# Patient Record
Sex: Male | Born: 1945 | Race: Black or African American | Hispanic: No | Marital: Single | Smoking: Former smoker
Health system: Southern US, Community
[De-identification: ages and names within clinical notes are randomized; demographics above are authoritative.]

## PROBLEM LIST (undated history)

## (undated) DIAGNOSIS — R350 Frequency of micturition: Secondary | ICD-10-CM

## (undated) DIAGNOSIS — N433 Hydrocele, unspecified: Secondary | ICD-10-CM

## (undated) DIAGNOSIS — I351 Nonrheumatic aortic (valve) insufficiency: Secondary | ICD-10-CM

## (undated) DIAGNOSIS — C679 Malignant neoplasm of bladder, unspecified: Secondary | ICD-10-CM

## (undated) DIAGNOSIS — Z8546 Personal history of malignant neoplasm of prostate: Secondary | ICD-10-CM

## (undated) DIAGNOSIS — I441 Atrioventricular block, second degree: Secondary | ICD-10-CM

## (undated) DIAGNOSIS — R351 Nocturia: Secondary | ICD-10-CM

## (undated) DIAGNOSIS — C61 Malignant neoplasm of prostate: Secondary | ICD-10-CM

## (undated) DIAGNOSIS — I10 Essential (primary) hypertension: Secondary | ICD-10-CM

## (undated) HISTORY — DX: Malignant neoplasm of prostate: C61

## (undated) HISTORY — DX: Essential (primary) hypertension: I10

## (undated) HISTORY — DX: Atrioventricular block, second degree: I44.1

## (undated) HISTORY — DX: Hydrocele, unspecified: N43.3

---

## 1989-03-12 HISTORY — PX: ABDOMINAL HERNIA REPAIR: SHX539

## 2012-05-08 HISTORY — PX: PROSTATE BIOPSY: SHX241

## 2012-06-19 ENCOUNTER — Encounter: Payer: Self-pay | Admitting: *Deleted

## 2012-06-19 NOTE — Progress Notes (Signed)
New Consult Prostate Cancer Biopsy 05/08/12=Adenocarcinoma,gleason=3+3=6,volume=25.41cc PSA=6.38 Divorced, no children, , alert,oriented x3, no dysuria , no c/o pain or discomfort Here to discuss seed implantation  Allergies:NKDA

## 2012-06-20 ENCOUNTER — Ambulatory Visit
Admission: RE | Admit: 2012-06-20 | Discharge: 2012-06-20 | Disposition: A | Discharge status: Home / Self Care | Payer: PRIVATE HEALTH INSURANCE | Source: Ambulatory Visit | Attending: Radiation Oncology | Admitting: Radiation Oncology

## 2012-06-20 ENCOUNTER — Encounter: Payer: Self-pay | Admitting: Radiation Oncology

## 2012-06-20 VITALS — BP 153/61 | HR 88 | Temp 97.5°F | Resp 20 | Ht 72.0 in | Wt 187.5 lb

## 2012-06-20 DIAGNOSIS — C61 Malignant neoplasm of prostate: Secondary | ICD-10-CM | POA: Insufficient documentation

## 2012-06-20 NOTE — Progress Notes (Signed)
   Complex simulation note/CT arch study: The patient was taken to the simulator. His pelvis was scanned. I contoured his prostate and determine that he had a contoured a volume of 31.85 cc. His prostatic length is 3.3 cm. The CT data set was then sent to the planning system for projection of his prostate volume over his bony pelvis. The arch is widely open. Of note is that Dr. Brunilda Payor obtained a prostate volume of 25.4 cc.

## 2012-06-20 NOTE — Progress Notes (Signed)
Please see the Nurse Progress Note in the MD Initial Consult Encounter for this patient. 

## 2012-06-20 NOTE — Progress Notes (Signed)
[facility] Radiation Oncology NEW PATIENT EVALUATION  Name: John [name] MRN: [id number]  Date:   06/20/2012           DOB: 08/26/1945  Status: outpatient   CC:   Lindaann Slough, MD Dr. Darnelle Catalan Hamrick   REFERRING PHYSICIAN: Lindaann Slough, MD   DIAGNOSIS: Stage TI C. favorable risk adenocarcinoma prostate   HISTORY OF PRESENT ILLNESS:  John [name] is a 66 y.o. male who is seen today for the courtesy of Dr. Brunilda Payor for consideration of radiation therapy in the management of his stage TI C. favorable risk adenocarcinoma prostate. His then have a rise in his PSA from 5.5 in July to 6.3 in August by Dr. Nathanial Rancher, his primary care physician. He was referred to Dr. Brunilda Payor who performed ultrasound-guided biopsies on 05/08/2012 after a repeat PSA on 03/27/2012 was 6.38.. His was found to have Gleason 6 (3+3) involving less than 5% of one core from the left lateral mid gland, 60% of one core from the left mid gland, 40% of one core from left lateral apex and 20% of one core from left apex. His gland volume was approximately 25.4 cc. he is doing well from a GU and GI standpoint. His I PSS score is 8. He claims to be potent.  PREVIOUS RADIATION THERAPY: No   PAST MEDICAL HISTORY:  has a past medical history of Prostate cancer (05/08/12) and Anxiety.     PAST SURGICAL HISTORY:  Past Surgical History  Procedure Date  . Hernia repair     hx  . Prostate biopsy 05/08/12    Adenocarcinoma     FAMILY HISTORY: family history is not on file. His father died of a brain aneurysm and 22. His mother died from complications of congestive heart failure and Alzheimer's disease at 26. No family history of prostate cancer.   SOCIAL HISTORY:  reports that he has been smoking Cigarettes.  He has a 20 pack-year smoking history. He has never used smokeless tobacco. He reports that he drinks alcohol. He reports that he does not use illicit drugs. Divorced, no children. Retired from a Product manager.   ALLERGIES: Review of patient's allergies indicates no known allergies.   MEDICATIONS:  No current outpatient prescriptions on file.     REVIEW OF SYSTEMS:  Pertinent items are noted in HPI.    PHYSICAL EXAM:  height is 6' (1.829 m) and weight is 187 lb 8 oz (85.049 kg). His oral temperature is 97.5 F (36.4 C). His blood pressure is 153/61 and his pulse is 88. His respiration is 20.   Alert and oriented 66 year old African American male appearing younger than stated age. Head and neck examination: Grossly unremarkable. Nodes: Without palpable cervical or supraclavicular lymphadenopathy. Chest: Lungs clear. Heart: Regular in rhythm. Back: Without spinal or CVA tenderness. Abdomen: Without masses organomegaly. Genitalia: Unremarkable to inspection. Rectal: The prostate gland is normal size and is without focal induration or nodularity. Extremities: Without edema. Neurologic examination: Grossly nonfocal.   LABORATORY DATA:  No results found for this basename: WBC, HGB, HCT, MCV, PLT   No results found for this basename: NA, K, CL, CO2   No results found for this basename: ALT, AST, GGT, ALKPHOS, BILITOT   PSA 6.38 from 03/27/2012.   IMPRESSION: Stage TI C. favorable risk adenocarcinoma prostate. I explained to the patient that his prognosis is related to his stage, PSA level, and Gleason score. All are favorable. We discussed surgery versus active surveillance versus radiation therapy.  We spent a great deal of time discussing seed implantation versus external beam/IMRT. We discussed the potential acute and late toxicities of radiation therapy. I think is a candidate for surgery, active surveillance, or radiation therapy. After lengthy discussion he is most interested in seed implantation which I think would be a good choice for him. We will go ahead and perform a CT arch study today, and then get him scheduled for seed implantation with Dr. Brunilda Payor. Consent is signed  today.   PLAN: As discussed above.   I spent 60 minutes minutes face to face with the patient and more than 50% of that time was spent in counseling and/or coordination of care.

## 2012-06-20 NOTE — Progress Notes (Signed)
Complex CT simulation/treatment planning note: The patient was taken to the CT simulator. His prostate volume was found the 31.85 cc with a prosthetic length 3.3 cm. His CT arch was open. I prescribing 14,500 cGy utilizing I-125 seeds. He'll be implanted with Nucletron seed Selectron system along with Dr. Brunilda Payor. Consent was signed today.

## 2012-06-21 NOTE — Addendum Note (Signed)
Encounter addended by: Delynn Flavin, RN on: 06/21/2012  7:56 PM<BR>     Documentation filed: Charges VN

## 2012-06-23 ENCOUNTER — Other Ambulatory Visit: Payer: Self-pay | Admitting: Urology

## 2012-06-26 ENCOUNTER — Telehealth: Payer: Self-pay | Admitting: *Deleted

## 2012-06-26 NOTE — Telephone Encounter (Signed)
CALLED PATIENT TO INFORM OF IMPLANT, SPOKE WITH PATIENT AND HE IS AWARE OF THIS PROCEDURE.

## 2012-06-30 ENCOUNTER — Ambulatory Visit (HOSPITAL_BASED_OUTPATIENT_CLINIC_OR_DEPARTMENT_OTHER)
Admission: RE | Admit: 2012-06-30 | Discharge: 2012-06-30 | Disposition: A | Discharge status: Home / Self Care | Payer: PRIVATE HEALTH INSURANCE | Source: Ambulatory Visit | Attending: Anesthesiology | Admitting: Anesthesiology

## 2012-06-30 ENCOUNTER — Encounter (HOSPITAL_BASED_OUTPATIENT_CLINIC_OR_DEPARTMENT_OTHER)
Admission: RE | Admit: 2012-06-30 | Discharge: 2012-06-30 | Disposition: A | Discharge status: Home / Self Care | Payer: PRIVATE HEALTH INSURANCE | Source: Ambulatory Visit | Attending: Urology | Admitting: Urology

## 2012-06-30 DIAGNOSIS — Z01818 Encounter for other preprocedural examination: Secondary | ICD-10-CM | POA: Insufficient documentation

## 2012-06-30 DIAGNOSIS — I517 Cardiomegaly: Secondary | ICD-10-CM | POA: Insufficient documentation

## 2012-06-30 DIAGNOSIS — R9431 Abnormal electrocardiogram [ECG] [EKG]: Secondary | ICD-10-CM | POA: Insufficient documentation

## 2012-06-30 DIAGNOSIS — I440 Atrioventricular block, first degree: Secondary | ICD-10-CM | POA: Insufficient documentation

## 2012-07-12 DIAGNOSIS — Z8546 Personal history of malignant neoplasm of prostate: Secondary | ICD-10-CM

## 2012-07-12 HISTORY — DX: Personal history of malignant neoplasm of prostate: Z85.46

## 2012-07-24 ENCOUNTER — Telehealth: Payer: Self-pay | Admitting: *Deleted

## 2012-07-24 NOTE — Telephone Encounter (Signed)
Called patient to remind of appt., lvm for a return call 

## 2012-07-25 LAB — COMPREHENSIVE METABOLIC PANEL
ALT: 9 U/L (ref 0–53)
AST: 17 U/L (ref 0–37)
CO2: 29 mEq/L (ref 19–32)
Calcium: 10 mg/dL (ref 8.4–10.5)
Chloride: 105 mEq/L (ref 96–112)
Creatinine, Ser: 1.05 mg/dL (ref 0.50–1.35)
GFR calc Af Amer: 83 mL/min — ABNORMAL LOW (ref >90)
GFR calc non Af Amer: 71 mL/min — ABNORMAL LOW (ref >90)
Glucose, Bld: 128 mg/dL — ABNORMAL HIGH (ref 70–99)
Sodium: 142 mEq/L (ref 135–145)
Total Bilirubin: 0.5 mg/dL (ref 0.3–1.2)

## 2012-07-25 LAB — CBC
Hemoglobin: 15.2 g/dL (ref 13.0–17.0)
MCH: 32.4 pg (ref 26.0–34.0)
MCV: 98.3 fL (ref 78.0–100.0)
Platelets: 176 10*3/uL (ref 150–400)
RBC: 4.69 MIL/uL (ref 4.22–5.81)
WBC: 10.5 10*3/uL (ref 4.0–10.5)

## 2012-07-25 LAB — APTT: aPTT: 31 seconds (ref 24–37)

## 2012-07-27 ENCOUNTER — Encounter (HOSPITAL_BASED_OUTPATIENT_CLINIC_OR_DEPARTMENT_OTHER): Payer: Self-pay | Admitting: *Deleted

## 2012-07-27 NOTE — Progress Notes (Signed)
Pt instructed npo p mn 1/20.  To [facility] 1/21 @ 0615.  Pt reminded to do fleets enema prior to arrival.

## 2012-07-31 ENCOUNTER — Telehealth: Payer: Self-pay | Admitting: *Deleted

## 2012-07-31 NOTE — Telephone Encounter (Signed)
CALLED PATIENT TO REMIND OF PROCEDURE FOR 08-01-12, SPOKE WITH PATIENT, AND HE IS AWARE OF THIS PROCEDURE.

## 2012-08-01 ENCOUNTER — Ambulatory Visit (HOSPITAL_BASED_OUTPATIENT_CLINIC_OR_DEPARTMENT_OTHER)
Admission: RE | Admit: 2012-08-01 | Discharge: 2012-08-01 | Disposition: A | Discharge status: Home / Self Care | Payer: PRIVATE HEALTH INSURANCE | Source: Ambulatory Visit | Attending: Urology | Admitting: Urology

## 2012-08-01 ENCOUNTER — Ambulatory Visit (HOSPITAL_COMMUNITY): Payer: PRIVATE HEALTH INSURANCE

## 2012-08-01 ENCOUNTER — Encounter (HOSPITAL_BASED_OUTPATIENT_CLINIC_OR_DEPARTMENT_OTHER)
Admission: RE | Disposition: A | Discharge status: Home / Self Care | Payer: Self-pay | Source: Ambulatory Visit | Attending: Urology

## 2012-08-01 ENCOUNTER — Encounter: Payer: Self-pay | Admitting: Radiation Oncology

## 2012-08-01 ENCOUNTER — Encounter (HOSPITAL_BASED_OUTPATIENT_CLINIC_OR_DEPARTMENT_OTHER): Payer: Self-pay | Admitting: Anesthesiology

## 2012-08-01 ENCOUNTER — Encounter (HOSPITAL_BASED_OUTPATIENT_CLINIC_OR_DEPARTMENT_OTHER): Payer: Self-pay

## 2012-08-01 ENCOUNTER — Ambulatory Visit (HOSPITAL_BASED_OUTPATIENT_CLINIC_OR_DEPARTMENT_OTHER): Payer: PRIVATE HEALTH INSURANCE | Admitting: Anesthesiology

## 2012-08-01 DIAGNOSIS — C61 Malignant neoplasm of prostate: Secondary | ICD-10-CM | POA: Insufficient documentation

## 2012-08-01 DIAGNOSIS — Z01812 Encounter for preprocedural laboratory examination: Secondary | ICD-10-CM | POA: Insufficient documentation

## 2012-08-01 HISTORY — PX: RADIOACTIVE SEED IMPLANT: SHX5150

## 2012-08-01 SURGERY — INSERTION, RADIATION SOURCE, PROSTATE
Anesthesia: General | Site: Prostate | Wound class: Clean Contaminated

## 2012-08-01 MED ORDER — LACTATED RINGERS IV SOLN
INTRAVENOUS | Status: DC
  Filled 2012-08-01: qty 1000

## 2012-08-01 MED ORDER — FENTANYL CITRATE 0.05 MG/ML IJ SOLN
INTRAMUSCULAR | Status: DC | PRN
  Administered 2012-08-01 (×2): 50 ug via INTRAVENOUS
  Administered 2012-08-01 (×4): 25 ug via INTRAVENOUS

## 2012-08-01 MED ORDER — HYDROCODONE-ACETAMINOPHEN 5-500 MG PO CAPS: 1.0000 | ORAL_CAPSULE | Freq: Four times a day (QID) | ORAL | Status: DC | PRN

## 2012-08-01 MED ORDER — MEPERIDINE HCL 25 MG/ML IJ SOLN
6.2500 mg | INTRAMUSCULAR | Status: DC | PRN
  Filled 2012-08-01: qty 1

## 2012-08-01 MED ORDER — ACETAMINOPHEN 10 MG/ML IV SOLN
INTRAVENOUS | Status: DC | PRN
  Administered 2012-08-01: 1000 mg via INTRAVENOUS

## 2012-08-01 MED ORDER — ONDANSETRON HCL 4 MG/2ML IJ SOLN
INTRAMUSCULAR | Status: DC | PRN
  Administered 2012-08-01: 4 mg via INTRAVENOUS

## 2012-08-01 MED ORDER — LACTATED RINGERS IV SOLN
INTRAVENOUS | Status: DC
  Administered 2012-08-01 (×3): via INTRAVENOUS
  Filled 2012-08-01: qty 1000

## 2012-08-01 MED ORDER — EPHEDRINE SULFATE 50 MG/ML IJ SOLN
INTRAMUSCULAR | Status: DC | PRN
  Administered 2012-08-01: 10 mg via INTRAVENOUS

## 2012-08-01 MED ORDER — FENTANYL CITRATE 0.05 MG/ML IJ SOLN
25.0000 ug | INTRAMUSCULAR | Status: DC | PRN
  Filled 2012-08-01: qty 1

## 2012-08-01 MED ORDER — IOHEXOL 350 MG/ML SOLN
INTRAVENOUS | Status: DC | PRN
  Administered 2012-08-01: 3 mL

## 2012-08-01 MED ORDER — LIDOCAINE HCL (CARDIAC) 20 MG/ML IV SOLN
INTRAVENOUS | Status: DC | PRN
  Administered 2012-08-01: 80 mg via INTRAVENOUS

## 2012-08-01 MED ORDER — PROPOFOL 10 MG/ML IV BOLUS
INTRAVENOUS | Status: DC | PRN
  Administered 2012-08-01 (×4): 10 mg via INTRAVENOUS
  Administered 2012-08-01: 250 mg via INTRAVENOUS

## 2012-08-01 MED ORDER — CIPROFLOXACIN IN D5W 400 MG/200ML IV SOLN
400.0000 mg | INTRAVENOUS | Status: AC
  Administered 2012-08-01: 400 mg via INTRAVENOUS
  Filled 2012-08-01: qty 200

## 2012-08-01 MED ORDER — CIPROFLOXACIN HCL 500 MG PO TABS: 500.0000 mg | ORAL_TABLET | Freq: Two times a day (BID) | ORAL | Status: DC

## 2012-08-01 MED ORDER — PROMETHAZINE HCL 25 MG/ML IJ SOLN
6.2500 mg | INTRAMUSCULAR | Status: DC | PRN
  Filled 2012-08-01: qty 1

## 2012-08-01 MED ORDER — FLEET ENEMA 7-19 GM/118ML RE ENEM
1.0000 | ENEMA | Freq: Once | RECTAL | Status: DC
  Filled 2012-08-01: qty 1

## 2012-08-01 MED ORDER — DEXAMETHASONE SODIUM PHOSPHATE 4 MG/ML IJ SOLN
INTRAMUSCULAR | Status: DC | PRN
  Administered 2012-08-01: 10 mg via INTRAVENOUS

## 2012-08-01 MED ORDER — GLYCOPYRROLATE 0.2 MG/ML IJ SOLN
INTRAMUSCULAR | Status: DC | PRN
  Administered 2012-08-01: 0.2 mg via INTRAVENOUS

## 2012-08-01 SURGICAL SUPPLY — 23 items
BAG URINE DRAINAGE (UROLOGICAL SUPPLIES) ×2 IMPLANT
BLADE SURG ROTATE 9660 (MISCELLANEOUS) ×2 IMPLANT
CATH FOLEY 2WAY SLVR  5CC 16FR (CATHETERS) ×2
CATH FOLEY 2WAY SLVR 5CC 16FR (CATHETERS) ×2 IMPLANT
CATH ROBINSON RED A/P 20FR (CATHETERS) ×2 IMPLANT
CLOTH BEACON ORANGE TIMEOUT ST (SAFETY) ×2 IMPLANT
COVER MAYO STAND STRL (DRAPES) ×2 IMPLANT
COVER TABLE BACK 60X90 (DRAPES) ×2 IMPLANT
DRSG TEGADERM 4X4.75 (GAUZE/BANDAGES/DRESSINGS) ×4 IMPLANT
DRSG TEGADERM 8X12 (GAUZE/BANDAGES/DRESSINGS) ×2 IMPLANT
GLOVE BIO SURGEON STRL SZ7 (GLOVE) ×4 IMPLANT
GLOVE BIO SURGEON STRL SZ7.5 (GLOVE) ×8 IMPLANT
GLOVE ECLIPSE 8.0 STRL XLNG CF (GLOVE) IMPLANT
GOWN SURGICAL LARGE (GOWNS) ×4 IMPLANT
HOLDER FOLEY CATH W/STRAP (MISCELLANEOUS) ×2 IMPLANT
IV WATER IRRIG STERILE 3000ML (IV SOLUTION) ×2 IMPLANT
PACK CYSTOSCOPY (CUSTOM PROCEDURE TRAY) ×2 IMPLANT
SPONGE GAUZE 4X4 12PLY (GAUZE/BANDAGES/DRESSINGS) ×2 IMPLANT
SYRINGE 10CC LL (SYRINGE) ×2 IMPLANT
TOWEL OR 17X24 6PK STRL BLUE (TOWEL DISPOSABLE) ×4 IMPLANT
UNDERPAD 30X30 INCONTINENT (UNDERPADS AND DIAPERS) ×4 IMPLANT
WATER STERILE IRR 500ML POUR (IV SOLUTION) ×2 IMPLANT
selectseed ×2 IMPLANT

## 2012-08-01 NOTE — H&P (Signed)
History and Physical  Chief Complaint: Adenocarcinoma of prostate  History of Present Illness: John [name] was initially seen on 03/27/12 for moderately elevated PSA of 5.5 in July.  Repeat PSA on 8/7 was 6.38.  Prostate biopsy on 10/28 revealed Gleason 6 adenocarcinoma in 4 out of 12 cores.  Treatment options were discussed in detail with him. He elected to have radiation therapy.  He is scheduled today for I 125 seeds implantation.  Past Medical History  Diagnosis Date  . Prostate cancer 05/08/12    Adenocarcinoma,gleason=3+3=6,PSA=6.38,volume=25.41cc  . Anxiety     new dx mild  . Seasonal allergies    Past Surgical History  Procedure Date  . Hernia repair     hx  . Prostate biopsy 05/08/12    Adenocarcinoma    Medications: None Allergies: No Known Allergies  History reviewed. No pertinent family history. Social History:  reports that he has been smoking Cigarettes.  He has a 20 pack-year smoking history. He has never used smokeless tobacco. He reports that he drinks alcohol. He reports that he does not use illicit drugs.  ROS: All systems are reviewed and negative except as noted.   Physical Exam:  Vital signs in last 24 hours: Temp:  [97.5 F (36.4 C)] 97.5 F (36.4 C) (01/21 0617) Pulse Rate:  [70] 70  (01/21 0617) Resp:  [18] 18  (01/21 0617) BP: (154)/(65) 154/65 mmHg (01/21 0617) SpO2:  [98 %] 98 % (01/21 0617) Weight:  [187 lb (84.823 kg)] 187 lb (84.823 kg) (01/21 0617) HEENT: Within normal limits. Neck: supple.  No cervical adenopathy Cardiovascular: Skin warm; not flushed Respiratory: Breaths quiet; no shortness of breath Abdomen: No masses Neurological: Normal sensation to touch Musculoskeletal: Normal motor function arms and legs Lymphatics: No inguinal adenopathy Skin: No rashes Genitourinary:Penis is uncircumcised. Scrotum is normal in appearance.  Testicles are normal.  Laboratory Data:  No results found for this or any previous visit (from the past 24  hour(s)). No results found for this or any previous visit (from the past 240 hour(s)). 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

## 2012-08-01 NOTE — Anesthesia Postprocedure Evaluation (Signed)
  Anesthesia Post-op Note  Patient: John [name]  Procedure(s) Performed: Procedure(s) (LRB): RADIOACTIVE SEED IMPLANT (N/A)  Patient Location: PACU  Anesthesia Type: General  Level of Consciousness: awake and alert   Airway and Oxygen Therapy: Patient Spontanous Breathing  Post-op Pain: mild  Post-op Assessment: Post-op Vital signs reviewed, Patient's Cardiovascular Status Stable, Respiratory Function Stable, Patent Airway and No signs of Nausea or vomiting  Last Vitals:  Filed Vitals:   08/01/12 1219  BP: 150/79  Pulse: 79  Temp: 36.2 C  Resp: 20    Post-op Vital Signs: stable   Complications: No apparent anesthesia complications

## 2012-08-01 NOTE — Anesthesia Preprocedure Evaluation (Addendum)
Anesthesia Evaluation  Patient identified by MRN, date of birth, ID band Patient awake    Reviewed: Allergy & Precautions, H&P , NPO status , Patient's Chart, lab work & pertinent test results  Airway Mallampati: II TM Distance: >3 FB Neck ROM: Full    Dental No notable dental hx.    Pulmonary Current Smoker,  breath sounds clear to auscultation  Pulmonary exam normal       Cardiovascular negative cardio ROS  Rhythm:Regular Rate:Normal     Neuro/Psych negative neurological ROS  negative psych ROS   GI/Hepatic negative GI ROS, Neg liver ROS, hiatal hernia,   Endo/Other  negative endocrine ROS  Renal/GU negative Renal ROS  negative genitourinary   Musculoskeletal negative musculoskeletal ROS (+)   Abdominal   Peds negative pediatric ROS (+)  Hematology negative hematology ROS (+)   Anesthesia Other Findings   Reproductive/Obstetrics negative OB ROS                           Anesthesia Physical Anesthesia Plan  ASA: II  Anesthesia Plan: General   Post-op Pain Management:    Induction: Intravenous  Airway Management Planned: LMA  Additional Equipment:   Intra-op Plan:   Post-operative Plan:   Informed Consent: I have reviewed the patients History and Physical, chart, labs and discussed the procedure including the risks, benefits and alternatives for the proposed anesthesia with the patient or authorized representative who has indicated his/her understanding and acceptance.   Dental advisory given  Plan Discussed with: CRNA  Anesthesia Plan Comments:         Anesthesia Quick Evaluation

## 2012-08-01 NOTE — Progress Notes (Signed)
Dr Brunilda Payor verifies pt to be D/C home w foley cath & do pt teaching regarding pt removing foley cath in AM.

## 2012-08-01 NOTE — Op Note (Signed)
John [name] is a 67 y.o.   08/01/2012  Preop diagnosis: Adenocarcinoma of prostate stage TI C.  Postop diagnosis: Same  Procedure done : I-125 seeds implantation, cystoscopy  Surgeon: Wendie Simmer. John [name] and Dr. Chipper Herb  Anesthesia: General  Indication: Patient is a 67 years old male who has adenocarcinoma of prostate Gleason 6 stage TI C. Treatment options were discussed with the patient. He elected to have radiation therapy. He is scheduled today for I-125 seeds implantation  Procedure: The patient was identified by his wrist band and proper timeout was taken.  Under general anesthesia he was prepped and draped and placed in the dorsolithotomy position. A #16 French Foley catheter was inserted in the bladder. Rectal tube was inserted in the rectum. Ultrasound planning was done by Dr. Dayton Scrape. When planning was completed on ultrasound guidance and with the Nucletron a total of 73 seeds were implanted in the prostate through 26 needles. The total apparent activity is 31.9010 mCi. Under fluoroscopy there appears to be good seeds distribution.  The Foley catheter was then removed. A flexible cystoscope was then inserted in the bladder. The urethra is normal. The bladder mucosa is normal. There is no stone or tumor in the bladder. The ureteral orifices are in normal position and shape with clear efflux. There is no evidence of seeds in the bladder. The cystoscope was then removed. A #16 French Foley catheter was then inserted in the bladder.  Patient tolerated the procedure well and left the OR in satisfactory condition to postanesthesia care unit.

## 2012-08-01 NOTE — Transfer of Care (Signed)
Immediate Anesthesia Transfer of Care Note  Patient: John [name]  Procedure(s) Performed: Procedure(s) (LRB): RADIOACTIVE SEED IMPLANT (N/A)  Patient Location: Patient transported to PACU with oxygen via face mask at 4 Liters / Min  Anesthesia Type: General  Level of Consciousness: awake and alert   Airway & Oxygen Therapy: Patient Spontanous Breathing and Patient connected to face mask oxygen  Post-op Assessment: Report given to PACU RN and Post -op Vital signs reviewed and stable  Post vital signs: Reviewed and stable  Dentition: Teeth and oropharynx remain in pre-op condition  Complications: No apparent anesthesia complications

## 2012-08-01 NOTE — Progress Notes (Signed)
[facility] Radiation Oncology Brachytherapy Operative Procedure Note  Name: John [name] MRN: [id number]  Date:   06/23/2012           DOB: 16-Jul-1945  Status:outpatient    CC: Dr. Su Grand, Dr. Neville Route  DIAGNOSIS: A 67 year old gentlemen with stage T1 C. adenocarcinoma of the prostate with a Gleason of 6 and a PSA of 6.38.  PROCEDURE: Insertion of radioactive I-125 seeds into the prostate gland.  RADIATION DOSE: 145 Gy, definitive therapy.  TECHNIQUE: [name] was brought to the operating room with Dr. Brunilda Payor. He was placed in the dorsolithotomy position. He was catheterized and a rectal tube was inserted. The perineum was shaved, prepped and draped. The ultrasound probe was then introduced into the rectum to see the prostate gland.  TREATMENT DEVICE: A needle grid was attached to the ultrasound probe stand and anchor needles were placed.  COMPLEX ISODOSE CALCULATION: The prostate was imaged in 3D using a sagittal sweep of the prostate probe. These images were transferred to the planning computer. There, the prostate, urethra and rectum were defined on each axial reconstructed image. Then, the software created an optimized plan and a few seed positions were adjusted. Then the accepted plan was uploaded to the seed Selectron afterloading unit.  SPECIAL TREATMENT PROCEDURE/SUPERVISION AND HANDLING: The Nucletron FIRST system was used to place the needles under sagittal guidance. A total of 24 needles were used to deposit 73 seeds in the prostate gland. The individual seed activity was 0.437 mCi for a total implant activity of 31.9 mCi.  COMPLEX SIMULATION: At the end of the procedure, an anterior radiograph of the pelvis was obtained to document seed positioning and count. Cystoscopy was performed to check the urethra and bladder.  MICRODOSIMETRY: At the end of the procedure, the patient was emitting 0.09 mrem/hr at 1 meter. Accordingly, he was considered safe for  hospital discharge.  PLAN: The patient will return to the radiation oncology clinic for post implant CT dosimetry in three weeks.

## 2012-08-01 NOTE — Discharge Instructions (Signed)
  Post Anesthesia Home Care Instructions  Activity: Get plenty of rest for the remainder of the day. A responsible adult should stay with you for 24 hours following the procedure.  For the next 24 hours, DO NOT: -Drive a car -Advertising copywriter -Drink alcoholic beverages -Take any medication unless instructed by your physician -Make any legal decisions or sign important papers.  Meals: Start with liquid foods such as gelatin or soup. Progress to regular foods as tolerated. Avoid greasy, spicy, heavy foods. If nausea and/or vomiting occur, drink only clear liquids until the nausea and/or vomiting subsides. Call your physician if vomiting continues.  Special Instructions/Symptoms: Your throat may feel dry or sore from the anesthesia or the breathing tube placed in your throat during surgery. If this causes discomfort, gargle with warm salt water. The discomfort should disappear within 24 hours.      Radioactive Seed Implant Home Care Instructions   Activity:  Rest for the remainder of the day.  Do not drive or operate equipment today.  You may resume normal         activities in a few days as instructed by your physician, without risk of harmful radiation                       exposure to those around you, provided you follow the time and distance precautions on the Radiation         Oncology Instruction Sheet.   Meals: Drink plenty of lipuids and eat light foods, such as gelatin or soup this evening .  You may return to normal meal    plan tomorrow.  Return To Work: You may return to work as instructed by Designer, multimedia.  Special Instruction: If any seeds are found, use tweezers to pick up seeds and place in a glass container of any kind and bring to your physician's office.  Call your physician if any of these symptoms occur:   Persistent or heavy bleeding  Urine stream diminishes or stops completely after catheter is removed  Fever equal to or greater than 101 degrees  F  Cloudy urine with a strong foul odor  Severe pain  You may feel some burning pain and/or hesitancy when you urinate after the catheter is removed.  These symptoms may increase over the next few weeks, but should diminish within forur to six weeks.  Applying moist heat to the lower abdomen or a hot tub bath may help relieve the pain.  If the discomfort becomes severe, please call your physician for additional medications.  Follow-up (Date of Return Visit to Physician): ***  Patient:_______________________________   @date @  Nurse:________________________________ @date @

## 2012-08-01 NOTE — Progress Notes (Signed)
CC: Dr. Su Grand, Dr. Burnell Blanks   End of treatment summary:  Diagnosis: Stage TI C. favorable risk adenocarcinoma prostate  Requesting physician: Dr. Su Grand  Intent: Curative  Implant date: 08/01/2012  Site/dose: Prostate 14,500 cGy, isotope I-125 utilizing 73 seeds and 24 needles. Individual seed activity 0.43 mCi per seed for a total implant activity of 31.9 mCi.  Narrative: John [name] appears to have undergone a successful Nucletron seed Selectron implant with Dr. Brunilda Payor.  Plan: The patient will return to see [place] in 3 weeks. At that time we will obtain a CT scan for his post implant dosimetry to assess the quality of his implant.

## 2012-08-01 NOTE — Anesthesia Procedure Notes (Signed)
Procedure Name: LMA Insertion Date/Time: 08/01/2012 7:48 AM Performed by: Fran Lowes Pre-anesthesia Checklist: Patient identified, Emergency Drugs available, Suction available and Patient being monitored Patient Re-evaluated:Patient Re-evaluated prior to inductionOxygen Delivery Method: Circle System Utilized Preoxygenation: Pre-oxygenation with 100% oxygen Intubation Type: IV induction Ventilation: Mask ventilation without difficulty LMA: LMA inserted LMA Size: 4.0 Number of attempts: 1 Airway Equipment and Method: bite block Placement Confirmation: positive ETCO2 Tube secured with: Tape Dental Injury: Teeth and Oropharynx as per pre-operative assessment

## 2012-08-02 ENCOUNTER — Encounter (HOSPITAL_BASED_OUTPATIENT_CLINIC_OR_DEPARTMENT_OTHER): Payer: Self-pay | Admitting: Urology

## 2012-08-21 ENCOUNTER — Telehealth: Payer: Self-pay | Admitting: *Deleted

## 2012-08-21 NOTE — Telephone Encounter (Signed)
Called patient to remind of appts. For  08-22-12, confirmed appts. With patient.

## 2012-08-22 ENCOUNTER — Ambulatory Visit
Admit: 2012-08-22 | Discharge: 2012-08-22 | Disposition: A | Discharge status: Home / Self Care | Payer: PRIVATE HEALTH INSURANCE | Attending: Radiation Oncology | Admitting: Radiation Oncology

## 2012-08-22 ENCOUNTER — Telehealth: Payer: Self-pay | Admitting: *Deleted

## 2012-08-22 ENCOUNTER — Encounter: Payer: Self-pay | Admitting: Radiation Oncology

## 2012-08-22 VITALS — BP 153/61 | HR 69 | Temp 97.4°F | Resp 20 | Wt 186.3 lb

## 2012-08-22 DIAGNOSIS — C61 Malignant neoplasm of prostate: Secondary | ICD-10-CM

## 2012-08-22 NOTE — Telephone Encounter (Signed)
Called and left voice message on phone after calling upstairs and speaking with Wilma asked her to call out patient name to see if he is in registration, heard her call out no answer, so as I said I left voice message for pataient 11:18 AM

## 2012-08-22 NOTE — Progress Notes (Signed)
Complex simulation note: The patient was taken to the CT simulator. His pelvis was scanned. The CT data set was sent to the [facility] system for contouring of his prostate and rectum. He is now ready for 3-D simulation to assess the quality of his prostate seed implant. There appears to be an excellent seed distribution.

## 2012-08-22 NOTE — Progress Notes (Signed)
Follow up s/p prostate Cancer post seed implant done 08/01/12 with Dr. Dayton Scrape and Dr. Su Grand Alert,oriented x3, voiced no dysuria, , at times when he emptys his bladder  In 1/2 hour needs to empty again,  Drinks a lot of tea and coffe, nocturia 2-3x night, regular bowel movements, no pain, not on any meds now 12:01 PM

## 2012-08-22 NOTE — Progress Notes (Signed)
CC: Dr. Su Grand  Followup note: [name] visits today approximately 3 weeks following his prostate seed implant with Dr. Brunilda Payor. He does have occasional sensation of incomplete bladder emptying. No significant dysuria. He does have nocturia x2-3. No GI difficulties. He'll see Dr. Brunilda Payor for a followup visit in May. His CT scan for his post implant dosimetry performed today shows an excellent seed distribution.  Physical examination: Alert and oriented. Wt Readings from Last 3 Encounters:  08/01/12 187 lb (84.823 kg)  08/01/12 187 lb (84.823 kg)  06/20/12 187 lb 8 oz (85.049 kg)   Temp Readings from Last 3 Encounters:  08/01/12 97.2 F (36.2 C) Oral  08/01/12 97.2 F (36.2 C) Oral  06/20/12 97.5 F (36.4 C) Oral   BP Readings from Last 3 Encounters:  08/01/12 150/79  08/01/12 150/79  06/20/12 153/61   Pulse Readings from Last 3 Encounters:  08/01/12 79  08/01/12 79  06/20/12 88   Rectal examination not performed today.  Impression: Satisfactory progress. We'll move ahead with his post implant dosimetry and for the results to Dr. Brunilda Payor.  Plan: Followup visit with Dr. Brunilda Payor in May at which time he'll have a PSA determination. I've not scheduled the patient for a formal followup visit and I asked that Dr. Brunilda Payor keep me posted on his progress.

## 2012-09-27 ENCOUNTER — Ambulatory Visit: Payer: PRIVATE HEALTH INSURANCE | Attending: Radiation Oncology

## 2012-09-27 DIAGNOSIS — C61 Malignant neoplasm of prostate: Secondary | ICD-10-CM | POA: Insufficient documentation

## 2012-09-30 ENCOUNTER — Encounter: Payer: Self-pay | Admitting: Radiation Oncology

## 2012-09-30 NOTE — Progress Notes (Signed)
Post implant CT dosimetry/3-D simulation note: John [name] underwent post-implant 3-D simulation to assess the quality of his seed implant on 09/27/2012. His intraoperative prostate volume by ultrasound was 37.5 cc. His postoperative prostate volume by CT was 38.9 cc, a good correlation. Dose volume histograms were obtained for the prostate and rectum. His prostate D 90 is 109% and V100  93.8 %, both excellent. Only 0.17 cc of rectum received the prescribed dose of 14,500 cGy. In summary, John [name] has excellent post implant dosimetry with a low-risk for late rectal toxicity.

## 2014-07-15 DIAGNOSIS — C61 Malignant neoplasm of prostate: Secondary | ICD-10-CM | POA: Diagnosis not present

## 2014-07-18 DIAGNOSIS — C61 Malignant neoplasm of prostate: Secondary | ICD-10-CM | POA: Diagnosis not present

## 2014-09-03 DIAGNOSIS — L309 Dermatitis, unspecified: Secondary | ICD-10-CM | POA: Diagnosis not present

## 2014-09-17 DIAGNOSIS — B86 Scabies: Secondary | ICD-10-CM | POA: Diagnosis not present

## 2015-01-14 DIAGNOSIS — C61 Malignant neoplasm of prostate: Secondary | ICD-10-CM | POA: Diagnosis not present

## 2015-01-20 DIAGNOSIS — C61 Malignant neoplasm of prostate: Secondary | ICD-10-CM | POA: Diagnosis not present

## 2015-01-20 DIAGNOSIS — R312 Other microscopic hematuria: Secondary | ICD-10-CM | POA: Diagnosis not present

## 2015-02-10 DIAGNOSIS — Z6826 Body mass index (BMI) 26.0-26.9, adult: Secondary | ICD-10-CM | POA: Diagnosis not present

## 2015-02-10 DIAGNOSIS — R319 Hematuria, unspecified: Secondary | ICD-10-CM | POA: Diagnosis not present

## 2015-02-10 DIAGNOSIS — C61 Malignant neoplasm of prostate: Secondary | ICD-10-CM | POA: Diagnosis not present

## 2015-02-24 DIAGNOSIS — R312 Other microscopic hematuria: Secondary | ICD-10-CM | POA: Diagnosis not present

## 2015-02-24 DIAGNOSIS — C61 Malignant neoplasm of prostate: Secondary | ICD-10-CM | POA: Diagnosis not present

## 2015-03-19 DIAGNOSIS — H6120 Impacted cerumen, unspecified ear: Secondary | ICD-10-CM | POA: Diagnosis not present

## 2015-03-19 DIAGNOSIS — Z6826 Body mass index (BMI) 26.0-26.9, adult: Secondary | ICD-10-CM | POA: Diagnosis not present

## 2015-03-19 DIAGNOSIS — N433 Hydrocele, unspecified: Secondary | ICD-10-CM | POA: Diagnosis not present

## 2015-08-19 DIAGNOSIS — M7041 Prepatellar bursitis, right knee: Secondary | ICD-10-CM | POA: Diagnosis not present

## 2015-08-26 DIAGNOSIS — Z1389 Encounter for screening for other disorder: Secondary | ICD-10-CM | POA: Diagnosis not present

## 2015-08-26 DIAGNOSIS — M7041 Prepatellar bursitis, right knee: Secondary | ICD-10-CM | POA: Diagnosis not present

## 2015-08-28 DIAGNOSIS — C61 Malignant neoplasm of prostate: Secondary | ICD-10-CM | POA: Diagnosis not present

## 2015-09-04 DIAGNOSIS — Z0000 Encounter for general adult medical examination without abnormal findings: Secondary | ICD-10-CM | POA: Diagnosis not present

## 2015-09-04 DIAGNOSIS — R310 Gross hematuria: Secondary | ICD-10-CM | POA: Diagnosis not present

## 2015-09-04 DIAGNOSIS — C61 Malignant neoplasm of prostate: Secondary | ICD-10-CM | POA: Diagnosis not present

## 2015-09-09 DIAGNOSIS — R310 Gross hematuria: Secondary | ICD-10-CM | POA: Diagnosis not present

## 2015-09-30 DIAGNOSIS — C61 Malignant neoplasm of prostate: Secondary | ICD-10-CM | POA: Diagnosis not present

## 2015-09-30 DIAGNOSIS — R42 Dizziness and giddiness: Secondary | ICD-10-CM | POA: Diagnosis not present

## 2015-10-03 DIAGNOSIS — R310 Gross hematuria: Secondary | ICD-10-CM | POA: Diagnosis not present

## 2015-10-03 DIAGNOSIS — Z0000 Encounter for general adult medical examination without abnormal findings: Secondary | ICD-10-CM | POA: Diagnosis not present

## 2015-10-06 ENCOUNTER — Other Ambulatory Visit: Payer: Self-pay | Admitting: Urology

## 2015-10-28 ENCOUNTER — Encounter (HOSPITAL_BASED_OUTPATIENT_CLINIC_OR_DEPARTMENT_OTHER): Payer: Self-pay | Admitting: *Deleted

## 2015-10-29 ENCOUNTER — Encounter (HOSPITAL_BASED_OUTPATIENT_CLINIC_OR_DEPARTMENT_OTHER): Payer: Self-pay | Admitting: *Deleted

## 2015-10-29 NOTE — Progress Notes (Signed)
NPO AFTER MN WITH EXCEPTION CLEAR LIQUIDS UNTIL 0830 (NO CREAM/ MILK PRODUCTS).  ARRIVE AT 1300. NEEDS HG. 

## 2015-11-03 ENCOUNTER — Ambulatory Visit (HOSPITAL_BASED_OUTPATIENT_CLINIC_OR_DEPARTMENT_OTHER): Payer: Medicare Other | Admitting: Anesthesiology

## 2015-11-03 ENCOUNTER — Encounter (HOSPITAL_BASED_OUTPATIENT_CLINIC_OR_DEPARTMENT_OTHER)
Admission: RE | Disposition: A | Discharge status: Home / Self Care | Payer: Self-pay | Source: Ambulatory Visit | Attending: Urology

## 2015-11-03 ENCOUNTER — Encounter (HOSPITAL_BASED_OUTPATIENT_CLINIC_OR_DEPARTMENT_OTHER): Payer: Self-pay | Admitting: *Deleted

## 2015-11-03 ENCOUNTER — Ambulatory Visit (HOSPITAL_BASED_OUTPATIENT_CLINIC_OR_DEPARTMENT_OTHER)
Admission: RE | Admit: 2015-11-03 | Discharge: 2015-11-03 | Disposition: A | Discharge status: Home / Self Care | Payer: Medicare Other | Source: Ambulatory Visit | Attending: Urology | Admitting: Urology

## 2015-11-03 DIAGNOSIS — D494 Neoplasm of unspecified behavior of bladder: Secondary | ICD-10-CM | POA: Diagnosis present

## 2015-11-03 DIAGNOSIS — C672 Malignant neoplasm of lateral wall of bladder: Secondary | ICD-10-CM | POA: Diagnosis not present

## 2015-11-03 DIAGNOSIS — N3289 Other specified disorders of bladder: Secondary | ICD-10-CM | POA: Diagnosis not present

## 2015-11-03 DIAGNOSIS — Z87891 Personal history of nicotine dependence: Secondary | ICD-10-CM | POA: Diagnosis not present

## 2015-11-03 DIAGNOSIS — C679 Malignant neoplasm of bladder, unspecified: Secondary | ICD-10-CM | POA: Diagnosis not present

## 2015-11-03 DIAGNOSIS — C678 Malignant neoplasm of overlapping sites of bladder: Secondary | ICD-10-CM | POA: Insufficient documentation

## 2015-11-03 DIAGNOSIS — Z8546 Personal history of malignant neoplasm of prostate: Secondary | ICD-10-CM | POA: Diagnosis not present

## 2015-11-03 HISTORY — DX: Personal history of malignant neoplasm of prostate: Z85.46

## 2015-11-03 HISTORY — PX: CYSTOSCOPY W/ RETROGRADES: SHX1426

## 2015-11-03 HISTORY — PX: TRANSURETHRAL RESECTION OF BLADDER TUMOR: SHX2575

## 2015-11-03 SURGERY — TURBT (TRANSURETHRAL RESECTION OF BLADDER TUMOR)
Anesthesia: General | Site: Bladder

## 2015-11-03 MED ORDER — PROPOFOL 10 MG/ML IV BOLUS
INTRAVENOUS | Status: AC
  Filled 2015-11-03: qty 20

## 2015-11-03 MED ORDER — LIDOCAINE HCL (CARDIAC) 20 MG/ML IV SOLN
INTRAVENOUS | Status: DC | PRN
  Administered 2015-11-03: 80 mg via INTRAVENOUS

## 2015-11-03 MED ORDER — ONDANSETRON HCL 4 MG/2ML IJ SOLN
INTRAMUSCULAR | Status: DC | PRN
  Administered 2015-11-03: 4 mg via INTRAVENOUS

## 2015-11-03 MED ORDER — SUCCINYLCHOLINE CHLORIDE 20 MG/ML IJ SOLN
INTRAMUSCULAR | Status: DC | PRN
  Administered 2015-11-03: 100 mg via INTRAVENOUS

## 2015-11-03 MED ORDER — FENTANYL CITRATE (PF) 100 MCG/2ML IJ SOLN
INTRAMUSCULAR | Status: DC | PRN
  Administered 2015-11-03: 25 ug via INTRAVENOUS
  Administered 2015-11-03: 50 ug via INTRAVENOUS
  Administered 2015-11-03: 25 ug via INTRAVENOUS

## 2015-11-03 MED ORDER — OXYCODONE-ACETAMINOPHEN 5-325 MG PO TABS: 1.0000 | ORAL_TABLET | ORAL | Status: DC | PRN

## 2015-11-03 MED ORDER — PROPOFOL 10 MG/ML IV BOLUS
INTRAVENOUS | Status: DC | PRN
  Administered 2015-11-03: 200 mg via INTRAVENOUS
  Administered 2015-11-03: 100 mg via INTRAVENOUS

## 2015-11-03 MED ORDER — FENTANYL CITRATE (PF) 100 MCG/2ML IJ SOLN
INTRAMUSCULAR | Status: AC
  Filled 2015-11-03: qty 2

## 2015-11-03 MED ORDER — DEXAMETHASONE SODIUM PHOSPHATE 4 MG/ML IJ SOLN
INTRAMUSCULAR | Status: DC | PRN
  Administered 2015-11-03: 10 mg via INTRAVENOUS

## 2015-11-03 MED ORDER — KETOROLAC TROMETHAMINE 30 MG/ML IJ SOLN
INTRAMUSCULAR | Status: DC | PRN
  Administered 2015-11-03: 30 mg via INTRAVENOUS

## 2015-11-03 MED ORDER — PROMETHAZINE HCL 25 MG/ML IJ SOLN
6.2500 mg | INTRAMUSCULAR | Status: DC | PRN
  Filled 2015-11-03: qty 1

## 2015-11-03 MED ORDER — CEFAZOLIN SODIUM-DEXTROSE 2-4 GM/100ML-% IV SOLN
INTRAVENOUS | Status: AC
  Filled 2015-11-03: qty 100

## 2015-11-03 MED ORDER — CEFAZOLIN SODIUM-DEXTROSE 2-4 GM/100ML-% IV SOLN
2.0000 g | INTRAVENOUS | Status: AC
  Administered 2015-11-03: 2 g via INTRAVENOUS
  Filled 2015-11-03: qty 100

## 2015-11-03 MED ORDER — MITOMYCIN CHEMO FOR BLADDER INSTILLATION 40 MG
40.0000 mg | Freq: Once | INTRAVENOUS | Status: AC
  Administered 2015-11-03: 40 mg via INTRAVESICAL
  Filled 2015-11-03 (×2): qty 40

## 2015-11-03 MED ORDER — METOCLOPRAMIDE HCL 5 MG/ML IJ SOLN
INTRAMUSCULAR | Status: DC | PRN
  Administered 2015-11-03: 10 mg via INTRAVENOUS

## 2015-11-03 MED ORDER — KETOROLAC TROMETHAMINE 30 MG/ML IJ SOLN
INTRAMUSCULAR | Status: AC
  Filled 2015-11-03: qty 1

## 2015-11-03 MED ORDER — LIDOCAINE HCL (CARDIAC) 20 MG/ML IV SOLN
INTRAVENOUS | Status: AC
  Filled 2015-11-03: qty 5

## 2015-11-03 MED ORDER — SODIUM CHLORIDE 0.9 % IR SOLN
Status: DC | PRN
  Administered 2015-11-03: 2000 mL via INTRAVESICAL
  Administered 2015-11-03: 6000 mL via INTRAVESICAL

## 2015-11-03 MED ORDER — CEFAZOLIN SODIUM 1-5 GM-% IV SOLN
1.0000 g | INTRAVENOUS | Status: DC
  Filled 2015-11-03: qty 50

## 2015-11-03 MED ORDER — ONDANSETRON HCL 4 MG/2ML IJ SOLN
INTRAMUSCULAR | Status: AC
  Filled 2015-11-03: qty 2

## 2015-11-03 MED ORDER — IOPAMIDOL (ISOVUE-370) INJECTION 76%
INTRAVENOUS | Status: DC | PRN
  Administered 2015-11-03: 5 mL

## 2015-11-03 MED ORDER — FENTANYL CITRATE (PF) 100 MCG/2ML IJ SOLN
25.0000 ug | INTRAMUSCULAR | Status: DC | PRN
  Administered 2015-11-03: 25 ug via INTRAVENOUS
  Filled 2015-11-03: qty 1

## 2015-11-03 MED ORDER — LACTATED RINGERS IV SOLN
INTRAVENOUS | Status: DC
  Administered 2015-11-03 (×2): via INTRAVENOUS
  Filled 2015-11-03: qty 1000

## 2015-11-03 SURGICAL SUPPLY — 32 items
BAG DRAIN URO-CYSTO SKYTR STRL (DRAIN) ×4 IMPLANT
BAG URINE DRAINAGE (UROLOGICAL SUPPLIES) ×2 IMPLANT
BAG URINE LEG 19OZ MD ST LTX (BAG) IMPLANT
BAG URINE LEG 500ML (DRAIN) IMPLANT
BASKET ZERO TIP NITINOL 2.4FR (BASKET) IMPLANT
CATH FOLEY 2WAY SLVR  5CC 22FR (CATHETERS)
CATH FOLEY 2WAY SLVR 30CC 20FR (CATHETERS) IMPLANT
CATH FOLEY 2WAY SLVR 5CC 22FR (CATHETERS) IMPLANT
CATH INTERMIT  6FR 70CM (CATHETERS) IMPLANT
CLOTH BEACON ORANGE TIMEOUT ST (SAFETY) ×2 IMPLANT
ELECT LOOP 22F BIPOLAR SML (ELECTROSURGICAL) ×2
ELECT REM PT RETURN 9FT ADLT (ELECTROSURGICAL) ×2
ELECTRODE LOOP 22F BIPOLAR SML (ELECTROSURGICAL) ×1 IMPLANT
ELECTRODE REM PT RTRN 9FT ADLT (ELECTROSURGICAL) ×1 IMPLANT
EVACUATOR MICROVAS BLADDER (UROLOGICAL SUPPLIES) IMPLANT
GLOVE BIO SURGEON STRL SZ8 (GLOVE) ×2 IMPLANT
GOWN STRL REUS W/ TWL LRG LVL3 (GOWN DISPOSABLE) ×1 IMPLANT
GOWN STRL REUS W/ TWL XL LVL3 (GOWN DISPOSABLE) ×1 IMPLANT
GOWN STRL REUS W/TWL LRG LVL3 (GOWN DISPOSABLE) ×1
GOWN STRL REUS W/TWL XL LVL3 (GOWN DISPOSABLE) ×1
GUIDEWIRE ANG ZIPWIRE 038X150 (WIRE) IMPLANT
GUIDEWIRE STR DUAL SENSOR (WIRE) ×2 IMPLANT
IV NS 1000ML (IV SOLUTION) ×2
IV NS 1000ML BAXH (IV SOLUTION) ×2 IMPLANT
IV NS IRRIG 3000ML ARTHROMATIC (IV SOLUTION) ×6 IMPLANT
KIT ROOM TURNOVER WOR (KITS) ×2 IMPLANT
MANIFOLD NEPTUNE II (INSTRUMENTS) ×2 IMPLANT
PACK CYSTO (CUSTOM PROCEDURE TRAY) ×2 IMPLANT
SET ASPIRATION TUBING (TUBING) IMPLANT
STENT URET 6FRX26 CONTOUR (STENTS) ×2 IMPLANT
SYRINGE IRR TOOMEY STRL 70CC (SYRINGE) IMPLANT
TUBE CONNECTING 12X1/4 (SUCTIONS) IMPLANT

## 2015-11-03 NOTE — Op Note (Signed)
Preoperative diagnosis: Bladder Tumor  Postop diagnosis: Same  Procedure: 1.  Cystoscopy 2. Bilateral retrograde pyelography 3. Intra-operative fluoroscopy, under 1 hour, with interpretation 4.Transurethral resection of bladder tumor, large 6cm 5. Left ureteral 6x26 JJ Stent Placement   6. Instillation of mitomycin C 40mg    Attending: Nicolette Bang  Anesthesia: General  Estimated blood loss: 5 cc  Drains: 1. 22 French Foley catheter 2. 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 No filling defects in bilateral collecting system  Indications: Patient is a 70 year old with a history of  bladder tumor found on office cystoscopy.   After discussing treatment options patient decided to proceed with transurethral resection of bladder tumor  Procedure in detail: Prior to procedure consetn was obtained. Patient was brought to the operating room and briefing was done sure correct patient, correct procedure, correct site.  General anesthesia was in administered patient was placed in the dorsal lithotomy position.  The rigid 28 French cystoscope was passed urethra and bladder.  Bladder was inspected masses or lesions and we noted a d 6 cm lesion on the left lateral wall of the bladder involving the left ureteral orifice.  We then cannulated the right ureteral orifice with a 6 French ureteral catheter.  A gentle retrograde was obtained in findings noted above. We removed the cystoscope and placed a 27 French resectoscope in the bladder.  Using bipolar electrocautery were then removed the 2 bladder tumors.  We removed the bladder tumors down to the base exposing muscle.  We then removed the pieces and sent them for pathology.  We then turned our attention to the left ureteral orifice.  The left ureteral orifice was cannulated with a 6 French ureteral catheter.  A gentle retrograde was obtained  in findings noted above.  We then placed a zip wire through the ureteral catheter and advanced up to the renal pelvis. We then placed a 6 x 26 double-J ureteral stent over the wire.  We then removed the wire and good coil was noted in the pelvis under fluoroscopy in the bladder under direct vision.   To obtain hemostasis we then cauterized the bed of the tumors.  Once good hemostasis was noted the bladder was then drained and a 22 French Foley catheter was placed. We then instilled mitomycin C 40mg  into the bladder and capped the foley. This concluded the procedure which resulted by the patient.  Complications: None  Condition: Stable,  extubated, transferred to PACU.  Plan: The patient will be discharged home after 1 hour instillation time for mitomycin. He will followup in 5 days for foley removal and 2 weeks for stent removal

## 2015-11-03 NOTE — Discharge Instructions (Signed)
Post Anesthesia Home Care Instructions  Activity: Get plenty of rest for the remainder of the day. A responsible adult should stay with you for 24 hours following the procedure.  For the next 24 hours, DO NOT: -Drive a car -Paediatric nurse -Drink alcoholic beverages -Take any medication unless instructed by your physician -Make any legal decisions or sign important papers.  Meals: Start with liquid foods such as gelatin or soup. Progress to regular foods as tolerated. Avoid greasy, spicy, heavy foods. If nausea and/or vomiting occur, drink only clear liquids until the nausea and/or vomiting subsides. Call your physician if vomiting continues.  Special Instructions/Symptoms: Your throat may feel dry or sore from the anesthesia or the breathing tube placed in your throat during surgery. If this causes discomfort, gargle with warm salt water. The discomfort should disappear within 24 hours.  If you had a scopolamine patch placed behind your ear for the management of post- operative nausea and/or vomiting:  1. The medication in the patch is effective for 72 hours, after which it should be removed.  Wrap patch in a tissue and discard in the trash. Wash hands thoroughly with soap and water. 2. You may remove the patch earlier than 72 hours if you experience unpleasant side effects which may include dry mouth, dizziness or visual disturbances. 3. Avoid touching the patch. Wash your hands with soap and water after contact with the patch.   Transurethral Resection, Bladder Tumor A cancerous growth (tumor) can develop on the inside wall of the bladder. The bladder is the organ that holds urine. One way to remove the tumor is a procedure called a transurethral resection. The tumor is removed (resected) through the tube that carries urine from the bladder out of the body (urethra). No cuts (incisions) are made in the skin. Instead, the procedure is done through a thin telescope, called a  resectoscope. Attached to it is a light and usually a tiny camera. The resectoscope is put into the urethra. In men, the urethra opens at the end of the penis. In women, it opens just above the vagina.  A transurethral resection is usually used to remove tumors that have not gotten too big or too deep. These are called Stage 0, Stage 1 or Stage 2 bladder cancers. LET YOUR CAREGIVER KNOW ABOUT:  On the day of the procedure, your caregivers will need to know the last time you had anything to eat or drink. This includes water, gum, and candy. In advance, make sure they know about:  1. Any allergies. 2. All medications you are taking, including:  Herbs, eyedrops, over-the-counter medications and creams.  Blood thinners (anticoagulants), aspirin or other drugs that could affect blood clotting. 3. Use of steroids (by mouth or as creams). 4. Previous problems with anesthetics, including local anesthetics. 5. Possibility of pregnancy, if this applies. 6. Any history of blood clots. 7. Any history of bleeding or other blood problems. 8. Previous surgery. 9. Smoking history. 10. Any recent symptoms of colds or infections. 11. Other health problems. RISKS AND COMPLICATIONS This is usually a safe procedure. Every procedure has risks, though. For a transurethral resection, they include: 1. Infection. Antibiotic medication would need to be taken. 2. Bleeding. 1. Light bleeding may last for several days after the procedure. 2. If bleeding continues or is heavy, the bladder may need rinsing. Or, a new catheter might be put in for awhile. 3. Sometimes bed rest is needed. 3. Urination problems. 1. Pain and burning can occur when urinating.  This usually goes away in a few days. 2. Scarring from the procedure can block the flow of urine. 4. Bladder damage. 1. It can be punctured or torn during removal of the tumor. If this happens, a catheter might be needed for longer. Antibiotics would be taken while the  bladder heals. 2. Urine can leak through the hole or tear into the abdomen. If this happens, surgery may be needed to repair the bladder. BEFORE THE PROCEDURE  1. A medical evaluation will be done. This may include: 1. A physical examination. 2. Urine test. This is to make sure you do not have a urinary tract infection. 3. Blood tests. 4. A test that checks the heart's rhythm (electrocardiogram). 5. Talking with an anesthesiologist. This is the person who will be in charge of the medication (anesthesia) to keep you from feeling pain during the transurethral resection. You might be asleep during the procedure (general anesthesia) or numb from the waist down, but awake during the procedure (spinal anesthesia). Ask your surgeon what to expect. 2. The person who is having a transurethral resection needs to give what is called informed consent. This requires signing a legal paper that gives permission for the procedure. To give informed consent: 1. You must understand how the procedure is done and why. 2. You must be told all the risks and benefits of the procedure. 3. You must sign the consent. Sometimes a legal guardian can do this. 4. Signing should be witnessed by a healthcare professional. 3. The day before the surgery, eat only a light dinner. Then, do not eat or drink anything for at least 8 hours before the surgery. Ask your caregiver if it is OK to take any needed medicines with a sip of water. 4. Arrive at least an hour before the surgery or whenever your surgeon recommends. This will give you time to check in and fill out any needed paperwork. PROCEDURE 1. The preparation: 1. You will change into a hospital gown. 2. A needle will be inserted in your arm. This is an intravenous access tube (IV). Medication will be able to flow directly into your body through this needle. 3. Small monitors will be put on your body. They are used to check your heart, blood pressure, and oxygen level. 4. You  might be given medication that will help you relax (sedative). 5. You will be given a general anesthetic or spinal anesthesia. 2. The procedure: 1. Once you are asleep or numb from the waist down, your legs will be placed in stirrups. 2. The resectoscope will be passed through the urethra into the bladder. 3. Fluid will be passed through the resectoscope. This will fill the bladder with water. 4. The surgeon will examine the bladder through the scope. If the scope has a camera, it can take pictures from inside the bladder. They can be projected onto a TV screen. 5. The surgeon will use various tools to remove the tumor in small pieces. Sometimes a laser (a beam of light energy) is used. Other tools may use electric current. 6. A tube (catheter) will often be placed so that urine can drain into a bag outside the body. This process helps stop bleeding. This tube keeps blood clots from blocking the urethra. 7. The procedure usually takes 30 to 45 minutes. AFTER THE PROCEDURE   You will stay in a recovery area until the anesthesia has worn off. Your blood pressure and pulse will be checked every so often. Then you will be taken  to a hospital room.  You may continue to get fluids through the IV for awhile.  Some pain is normal. The catheter might be uncomfortable. Pain is usually not severe. If it is, ask for pain medicine.  Your urine may look bloody after a transurethral resection. This is normal.  If bleeding is heavy, a hospital caregiver may rinse out the bladder (irrigation) through the catheter.  Once the urine is clear, the catheter will be taken out.  You will need to stay in the hospital until you can urinate on your own.  Most people stay in the hospital for up to 4 days. PROGNOSIS   Transurethral resection is considered the best way to treat bladder tumors that are not too far along. For most people, the treatment is successful. Sometimes, though, more treatment is  needed.  Bladder cancers can come back even after a successful procedure. Because of this, be sure to have a checkup with your caregiver every 3 to 6 months. If everything is OK for 3 years, you can reduce the checkups to once a year.   This information is not intended to replace advice given to you by your health care provider. Make sure you discuss any questions you have with your health care provider.   Document Released: 04/24/2009 Document Revised: 09/20/2011 Document Reviewed: 06/30/2009 Elsevier Interactive Patient Education 2016 [place], Adult A Foley catheter is a soft, flexible tube. This tube is placed into your bladder to drain pee (urine). If you go home with this catheter in place, follow the instructions below. TAKING CARE OF THE CATHETER 12. Wash your hands with soap and water. 13. Put soap and water on a clean washcloth.  Clean the skin where the tube goes into your body.  Clean away from the tube site.  Never wipe toward the tube.  Clean the area using a circular motion.  Remove all the soap. Pat the area dry with a clean towel. For males, reposition the skin that covers the end of the penis (foreskin). 14. Attach the tube to your leg with tape or a leg strap. Do not stretch the tube tight. If you are using tape, remove any stickiness left behind by past tape you used. 15. Keep the drainage bag below your hips. Keep it off the floor. 16. Check your tube during the day. Make sure it is working and draining. Make sure the tube does not curl, twist, or bend. 17. Do not pull on the tube or try to take it out. TAKING CARE OF THE DRAINAGE BAGS You will have a large overnight drainage bag and a small leg bag. You may wear the overnight bag any time. Never wear the small bag at night. Follow the directions below. Emptying the Drainage Bag Empty your drainage bag when it is  - full or at least 2-3 times a day. 5. Wash your hands with soap and  water. 6. Keep the drainage bag below your hips. 7. Hold the dirty bag over the toilet or clean container. 8. Open the pour spout at the bottom of the bag. Empty the pee into the toilet or container. Do not let the pour spout touch anything. 9. Clean the pour spout with a gauze pad or cotton ball that has rubbing alcohol on it. 10. Close the pour spout. 11. Attach the bag to your leg with tape or a leg strap. 12. Wash your hands well. Changing the Drainage Bag Change your bag once  a month or sooner if it starts to smell or look dirty.  5. Wash your hands with soap and water. 6. Pinch the rubber tube so that pee does not spill out. 7. Disconnect the catheter tube from the drainage tube at the connection valve. Do not let the tubes touch anything. 8. Clean the end of the catheter tube with an alcohol wipe. Clean the end of a the drainage tube with a different alcohol wipe. 9. Connect the catheter tube to the drainage tube of the clean drainage bag. 10. Attach the new bag to the leg with tape or a leg strap. Avoid attaching the new bag too tightly. 11. Wash your hands well. Cleaning the Drainage Bag 3. Wash your hands with soap and water. 4. Wash the bag in warm, soapy water. 5. Rinse the bag with warm water. 6. Fill the bag with a mixture of white vinegar and water (1 cup vinegar to 1 quart warm water [.2 liter vinegar to 1 liter warm water]). Close the bag and soak it for 30 minutes in the solution. 7. Rinse the bag with warm water. 8. Hang the bag to dry with the pour spout open and hanging downward. 9. Store the clean bag (once it is dry) in a clean plastic bag. 10. Wash your hands well. PREVENT INFECTION  Wash your hands before and after touching your tube.  Take showers every day. Wash the skin where the tube enters your body. Do not take baths. Replace wet leg straps with dry ones, if this applies.  Do not use powders, sprays, or lotions on the genital area. Only use creams,  lotions, or ointments as told by your doctor.  For females, wipe from front to back after going to the bathroom.  Drink enough fluids to keep your pee clear or pale yellow unless you are told not to have too much fluid (fluid restriction).  Do not let the drainage bag or tubing touch or lie on the floor.  Wear cotton underwear to keep the area dry. GET HELP IF:  Your pee is cloudy or smells unusually bad.  Your tube becomes clogged.  You are not draining pee into the bag or your bladder feels full.  Your tube starts to leak. GET HELP RIGHT AWAY IF:  You have pain, puffiness (swelling), redness, or yellowish-white fluid (pus) where the tube enters the body.  You have pain in the belly (abdomen), legs, lower back, or bladder.  You have a fever.  You see blood fill the tube, or your pee is pink or red.  You feel sick to your stomach (nauseous), throw up (vomit), or have chills.  Your tube gets pulled out. MAKE SURE YOU:   Understand these instructions.  Will watch your condition.  Will get help right away if you are not doing well or get worse.   This information is not intended to replace advice given to you by your health care provider. Make sure you discuss any questions you have with your health care provider.   Document Released: 10/23/2012 Document Revised: 07/19/2014 Document Reviewed: 10/23/2012 Elsevier Interactive Patient Education [employer].

## 2015-11-03 NOTE — Transfer of Care (Signed)
Immediate Anesthesia Transfer of Care Note  Patient: John [name]  Procedure(s) Performed: Procedure(s) (LRB): TRANSURETHRAL RESECTION OF BLADDER TUMOR (TURBT)WITH INSERTION STENT (N/A) CYSTOSCOPY WITH RETROGRADE PYELOGRAM (N/A)  Patient Location: PACU  Anesthesia Type: General  Level of Consciousness: awake, sedated, patient cooperative and responds to stimulation  Airway & Oxygen Therapy: Patient Spontanous Breathing and Patient connected to face mask oxygen  Post-op Assessment: Report given to PACU RN, Post -op Vital signs reviewed and stable and Patient moving all extremities  Post vital signs: Reviewed and stable  Complications: No apparent anesthesia complications

## 2015-11-03 NOTE — Anesthesia Postprocedure Evaluation (Signed)
Anesthesia Post Note  Patient: John [name]  Procedure(s) Performed: Procedure(s) (LRB): TRANSURETHRAL RESECTION OF BLADDER TUMOR (TURBT)WITH INSERTION STENT (N/A) CYSTOSCOPY WITH RETROGRADE PYELOGRAM (N/A)  Patient location during evaluation: PACU Anesthesia Type: General Level of consciousness: awake and alert Pain management: pain level controlled Vital Signs Assessment: post-procedure vital signs reviewed and stable Respiratory status: spontaneous breathing, nonlabored ventilation, respiratory function stable and patient connected to nasal cannula oxygen Cardiovascular status: blood pressure returned to baseline and stable Postop Assessment: no signs of nausea or vomiting Anesthetic complications: no    Last Vitals:  Filed Vitals:   11/03/15 1243 11/03/15 1507  BP: 176/59 175/75  Pulse: 72 83  Temp: 37.1 C 36.6 C  Resp: 16 22    Last Pain:  Filed Vitals:   11/03/15 1511  PainSc: 0-No pain                 Zenaida Deed

## 2015-11-03 NOTE — Anesthesia Preprocedure Evaluation (Addendum)
Anesthesia Evaluation  Patient identified by MRN, date of birth, ID band Patient awake    Reviewed: Allergy & Precautions, NPO status , Patient's Chart, lab work & pertinent test results  Airway Mallampati: II  TM Distance: >3 FB Neck ROM: Full    Dental no notable dental hx.    Pulmonary former smoker,    Pulmonary exam normal breath sounds clear to auscultation       Cardiovascular negative cardio ROS Normal cardiovascular exam Rhythm:Regular Rate:Normal     Neuro/Psych negative neurological ROS  negative psych ROS   GI/Hepatic negative GI ROS, Neg liver ROS,   Endo/Other  negative endocrine ROS  Renal/GU negative Renal ROS  negative genitourinary   Musculoskeletal negative musculoskeletal ROS (+)   Abdominal   Peds negative pediatric ROS (+)  Hematology negative hematology ROS (+)   Anesthesia Other Findings   Reproductive/Obstetrics negative OB ROS                             Anesthesia Physical Anesthesia Plan  ASA: I  Anesthesia Plan: General   Post-op Pain Management:    Induction: Intravenous  Airway Management Planned: LMA  Additional Equipment:   Intra-op Plan:   Post-operative Plan: Extubation in OR  Informed Consent: I have reviewed the patients History and Physical, chart, labs and discussed the procedure including the risks, benefits and alternatives for the proposed anesthesia with the patient or authorized representative who has indicated his/her understanding and acceptance.   Dental advisory given  Plan Discussed with: CRNA  Anesthesia Plan Comments:         Anesthesia Quick Evaluation

## 2015-11-03 NOTE — Anesthesia Procedure Notes (Addendum)
Procedure Name: LMA Insertion Date/Time: 11/03/2015 2:05 PM Performed by: Justice Rocher Pre-anesthesia Checklist: Patient identified, Emergency Drugs available, Suction available and Patient being monitored Patient Re-evaluated:Patient Re-evaluated prior to inductionOxygen Delivery Method: Circle System Utilized Preoxygenation: Pre-oxygenation with 100% oxygen Intubation Type: IV induction Ventilation: Mask ventilation without difficulty LMA: LMA inserted LMA Size: 5.0 Number of attempts: 1 Airway Equipment and Method: bite block Placement Confirmation: positive ETCO2 Tube secured with: Tape Dental Injury: Teeth and Oropharynx as per pre-operative assessment     Procedure Name: Intubation Date/Time: 11/03/2015 2:29 PM Performed by: Justice Rocher Pre-anesthesia Checklist: Patient identified, Emergency Drugs available, Suction available and Patient being monitored Patient Re-evaluated:Patient Re-evaluated prior to inductionOxygen Delivery Method: Circle System Utilized Preoxygenation: Pre-oxygenation with 100% oxygen Intubation Type: IV induction Ventilation: Mask ventilation without difficulty Laryngoscope Size: Mac and 4 Grade View: Grade III Tube type: Oral Tube size: 8.0 mm Number of attempts: 2 Airway Equipment and Method: Stylet and Oral airway Placement Confirmation: ETT inserted through vocal cords under direct vision,  positive ETCO2 and breath sounds checked- equal and bilateral Secured at: 26 cm Tube secured with: Tape Dental Injury: Teeth and Oropharynx as per pre-operative assessment  Comments: Surgeon request intubation

## 2015-11-03 NOTE — Brief Op Note (Signed)
11/03/2015  2:53 PM  PATIENT:  August Saucer  70 y.o. male  PRE-OPERATIVE DIAGNOSIS:  BLADDER TUMOR  POST-OPERATIVE DIAGNOSIS:  BLADDER TUMOR  PROCEDURE:  Procedure(s): TRANSURETHRAL RESECTION OF BLADDER TUMOR (TURBT) (N/A) CYSTOSCOPY WITH RETROGRADE PYELOGRAM (N/A)  SURGEON:  Surgeon(s) and Role:    * Cleon Gustin, MD - Primary  PHYSICIAN ASSISTANT:   ASSISTANTS: none   ANESTHESIA:   general  EBL:  Total I/O In: 1000 [I.V.:1000] Out: 5 [Blood:5]  BLOOD ADMINISTERED:none  DRAINS: Urinary Catheter (Foley) left 6x26 JJ ureteral stent  LOCAL MEDICATIONS USED:  NONE  SPECIMEN:  Source of Specimen:  left lateral wall bladder tumor  DISPOSITION OF SPECIMEN:  PATHOLOGY  COUNTS:  YES  TOURNIQUET:  * No tourniquets in log *  DICTATION: .Note written in EPIC  PLAN OF CARE: Discharge to home after PACU  PATIENT DISPOSITION:  PACU - hemodynamically stable.   Delay start of Pharmacological VTE agent (>24hrs) due to surgical blood loss or risk of bleeding: not applicable

## 2015-11-03 NOTE — H&P (Signed)
Urology Admission H&P  Chief Complaint: gross hematuria  History of Present Illness: Mr Hervey is a 70yo with a hx of gross hematuria who was found to have a bladder tumor on hematuria workup. He denies LUTS except for occasional dysuria  Past Medical History  Diagnosis Date  . Seasonal allergies   . History of prostate cancer urologist-  dr Alyson Ingles--  last PSA 0.8  in April 2017    dx 2013--  Stage T1c,  Gleason 3+3,  PSA 6.38,  vol 25.41cc--  s/p  radioactive seed implants 08-01-2012  . Wears glasses    Past Surgical History  Procedure Laterality Date  . 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 Radioactive seed implant  08/01/2012    Procedure: RADIOACTIVE SEED IMPLANT;  Surgeon: Hanley Ben, MD;  Location: [facility];  Service: Urology;  Laterality: N/A;  73 seeds implanted no seeds found in bladder  . Abdominal hernia repair  1990's    Home Medications:  No prescriptions prior to admission   Allergies: No Known Allergies  History reviewed. No pertinent family history. Social History:  reports that he quit smoking about a year ago. His smoking use included Cigarettes. He has a 21 pack-year smoking history. He has never used smokeless tobacco. He reports that he drinks alcohol. He reports that he does not use illicit drugs.  Review of Systems  Genitourinary: Positive for dysuria and hematuria.  All other systems reviewed and are negative.   Physical Exam:  Vital signs in last 24 hours: Temp:  [98.8 F (37.1 C)] 98.8 F (37.1 C) (04/24 1243) Pulse Rate:  [72] 72 (04/24 1243) Resp:  [16] 16 (04/24 1243) BP: (176)/(59) 176/59 mmHg (04/24 1243) SpO2:  [100 %] 100 % (04/24 1243) Weight:  [88.225 kg (194 lb 8 oz)] 88.225 kg (194 lb 8 oz) (04/24 1243) Physical Exam  Constitutional: He is oriented to person, place, and time. He appears well-developed and well-nourished.  HENT:  Head: Normocephalic and atraumatic.  Eyes: EOM are normal. Pupils are  equal, round, and reactive to light.  Neck: Normal range of motion. No thyromegaly present.  Cardiovascular: Normal rate and regular rhythm.   Respiratory: Effort normal. No respiratory distress.  GI: Soft. He exhibits no distension and no mass. There is no tenderness. There is no rebound and no guarding.  Musculoskeletal: Normal range of motion.  Neurological: He is alert and oriented to person, place, and time.  Skin: Skin is warm and dry.  Psychiatric: He has a normal mood and affect. His behavior is normal. Judgment and thought content normal.    Laboratory Data:  No results found for this or any previous visit (from the past 24 hour(s)). No results found for this or any previous visit (from the past 240 hour(s)). Creatinine: No results for input(s): CREATININE in the last 168 hours. Baseline Creatinine: unknown  Impression/Assessment:  69yo with bladder tumor  Plan:  The risks/benefits/alternatives to TURBT with mitomycin instillation was explained to the patient and he understands and wishes to proceed with surgery  Nicolette Bang 11/03/2015, 1:52 PM

## 2015-11-04 ENCOUNTER — Encounter (HOSPITAL_BASED_OUTPATIENT_CLINIC_OR_DEPARTMENT_OTHER): Payer: Self-pay | Admitting: Urology

## 2015-11-04 LAB — POCT HEMOGLOBIN-HEMACUE: Hemoglobin: 15.4 g/dL (ref 13.0–17.0)

## 2015-11-11 DIAGNOSIS — R310 Gross hematuria: Secondary | ICD-10-CM | POA: Diagnosis not present

## 2015-12-12 DIAGNOSIS — C672 Malignant neoplasm of lateral wall of bladder: Secondary | ICD-10-CM | POA: Diagnosis not present

## 2015-12-12 DIAGNOSIS — R310 Gross hematuria: Secondary | ICD-10-CM | POA: Diagnosis not present

## 2015-12-12 DIAGNOSIS — R300 Dysuria: Secondary | ICD-10-CM | POA: Diagnosis not present

## 2015-12-15 ENCOUNTER — Other Ambulatory Visit: Payer: Self-pay | Admitting: Urology

## 2016-01-01 ENCOUNTER — Encounter (HOSPITAL_BASED_OUTPATIENT_CLINIC_OR_DEPARTMENT_OTHER): Payer: Self-pay | Admitting: *Deleted

## 2016-01-01 NOTE — Progress Notes (Signed)
NPO AFTER MN. ARRIVE AT 1045. NEEDS HG. 

## 2016-01-09 ENCOUNTER — Ambulatory Visit (HOSPITAL_BASED_OUTPATIENT_CLINIC_OR_DEPARTMENT_OTHER): Payer: Medicare Other | Admitting: Anesthesiology

## 2016-01-09 ENCOUNTER — Ambulatory Visit (HOSPITAL_BASED_OUTPATIENT_CLINIC_OR_DEPARTMENT_OTHER)
Admission: RE | Admit: 2016-01-09 | Discharge: 2016-01-09 | Disposition: A | Discharge status: Home / Self Care | Payer: Medicare Other | Source: Ambulatory Visit | Attending: Urology | Admitting: Urology

## 2016-01-09 ENCOUNTER — Encounter (HOSPITAL_BASED_OUTPATIENT_CLINIC_OR_DEPARTMENT_OTHER): Payer: Self-pay | Admitting: *Deleted

## 2016-01-09 ENCOUNTER — Encounter (HOSPITAL_BASED_OUTPATIENT_CLINIC_OR_DEPARTMENT_OTHER)
Admission: RE | Disposition: A | Discharge status: Home / Self Care | Payer: Self-pay | Source: Ambulatory Visit | Attending: Urology

## 2016-01-09 DIAGNOSIS — Z87891 Personal history of nicotine dependence: Secondary | ICD-10-CM | POA: Diagnosis not present

## 2016-01-09 DIAGNOSIS — Z8546 Personal history of malignant neoplasm of prostate: Secondary | ICD-10-CM | POA: Insufficient documentation

## 2016-01-09 DIAGNOSIS — R300 Dysuria: Secondary | ICD-10-CM | POA: Diagnosis not present

## 2016-01-09 DIAGNOSIS — Z8551 Personal history of malignant neoplasm of bladder: Secondary | ICD-10-CM | POA: Insufficient documentation

## 2016-01-09 DIAGNOSIS — C672 Malignant neoplasm of lateral wall of bladder: Secondary | ICD-10-CM | POA: Diagnosis not present

## 2016-01-09 DIAGNOSIS — R310 Gross hematuria: Secondary | ICD-10-CM | POA: Diagnosis not present

## 2016-01-09 DIAGNOSIS — N3090 Cystitis, unspecified without hematuria: Secondary | ICD-10-CM | POA: Diagnosis not present

## 2016-01-09 DIAGNOSIS — C679 Malignant neoplasm of bladder, unspecified: Secondary | ICD-10-CM | POA: Diagnosis not present

## 2016-01-09 HISTORY — PX: TRANSURETHRAL RESECTION OF BLADDER TUMOR WITH GYRUS (TURBT-GYRUS): SHX6458

## 2016-01-09 HISTORY — DX: Malignant neoplasm of bladder, unspecified: C67.9

## 2016-01-09 HISTORY — DX: Nocturia: R35.1

## 2016-01-09 HISTORY — PX: CYSTOSCOPY W/ URETERAL STENT REMOVAL: SHX1430

## 2016-01-09 HISTORY — PX: CYSTOSCOPY W/ RETROGRADES: SHX1426

## 2016-01-09 HISTORY — DX: Frequency of micturition: R35.0

## 2016-01-09 LAB — POCT HEMOGLOBIN-HEMACUE: HEMOGLOBIN: 13.2 g/dL (ref 13.0–17.0)

## 2016-01-09 SURGERY — TRANSURETHRAL RESECTION OF BLADDER TUMOR WITH GYRUS (TURBT-GYRUS)
Anesthesia: General | Site: Ureter

## 2016-01-09 MED ORDER — FENTANYL CITRATE (PF) 100 MCG/2ML IJ SOLN
25.0000 ug | INTRAMUSCULAR | Status: DC | PRN
  Administered 2016-01-09: 25 ug via INTRAVENOUS
  Filled 2016-01-09: qty 1

## 2016-01-09 MED ORDER — LIDOCAINE HCL (CARDIAC) 20 MG/ML IV SOLN
INTRAVENOUS | Status: DC | PRN
  Administered 2016-01-09: 100 mg via INTRAVENOUS

## 2016-01-09 MED ORDER — MIDAZOLAM HCL 5 MG/5ML IJ SOLN
INTRAMUSCULAR | Status: DC | PRN
  Administered 2016-01-09: 2 mg via INTRAVENOUS

## 2016-01-09 MED ORDER — FENTANYL CITRATE (PF) 100 MCG/2ML IJ SOLN
INTRAMUSCULAR | Status: AC
  Filled 2016-01-09: qty 2

## 2016-01-09 MED ORDER — EPHEDRINE SULFATE 50 MG/ML IJ SOLN
INTRAMUSCULAR | Status: DC | PRN
  Administered 2016-01-09: 10 mg via INTRAVENOUS

## 2016-01-09 MED ORDER — KETOROLAC TROMETHAMINE 30 MG/ML IJ SOLN
INTRAMUSCULAR | Status: AC
  Filled 2016-01-09: qty 1

## 2016-01-09 MED ORDER — DEXTROSE 5 % IV SOLN
INTRAVENOUS | Status: AC
  Filled 2016-01-09: qty 50

## 2016-01-09 MED ORDER — SODIUM CHLORIDE 0.9 % IR SOLN
Status: DC | PRN
  Administered 2016-01-09: 3000 mL via INTRAVESICAL
  Administered 2016-01-09: 1000 mL via INTRAVESICAL

## 2016-01-09 MED ORDER — LACTATED RINGERS IV SOLN
INTRAVENOUS | Status: DC
  Administered 2016-01-09: 12:00:00 via INTRAVENOUS
  Filled 2016-01-09: qty 1000

## 2016-01-09 MED ORDER — OXYCODONE-ACETAMINOPHEN 5-325 MG PO TABS: 1.0000 | ORAL_TABLET | ORAL | Status: DC | PRN

## 2016-01-09 MED ORDER — MIDAZOLAM HCL 2 MG/2ML IJ SOLN
INTRAMUSCULAR | Status: AC
  Filled 2016-01-09: qty 2

## 2016-01-09 MED ORDER — SUGAMMADEX SODIUM 200 MG/2ML IV SOLN
INTRAVENOUS | Status: AC
  Filled 2016-01-09: qty 2

## 2016-01-09 MED ORDER — PROPOFOL 10 MG/ML IV BOLUS
INTRAVENOUS | Status: DC | PRN
  Administered 2016-01-09: 250 mg via INTRAVENOUS

## 2016-01-09 MED ORDER — ONDANSETRON HCL 4 MG/2ML IJ SOLN
INTRAMUSCULAR | Status: DC | PRN
  Administered 2016-01-09: 4 mg via INTRAVENOUS

## 2016-01-09 MED ORDER — DEXAMETHASONE SODIUM PHOSPHATE 10 MG/ML IJ SOLN
INTRAMUSCULAR | Status: AC
  Filled 2016-01-09: qty 1

## 2016-01-09 MED ORDER — FENTANYL CITRATE (PF) 100 MCG/2ML IJ SOLN
INTRAMUSCULAR | Status: DC | PRN
  Administered 2016-01-09 (×3): 50 ug via INTRAVENOUS

## 2016-01-09 MED ORDER — ROCURONIUM BROMIDE 100 MG/10ML IV SOLN
INTRAVENOUS | Status: DC | PRN
  Administered 2016-01-09: 10 mg via INTRAVENOUS
  Administered 2016-01-09: 20 mg via INTRAVENOUS

## 2016-01-09 MED ORDER — STERILE WATER FOR IRRIGATION IR SOLN
Status: DC | PRN
  Administered 2016-01-09: 3000 mL

## 2016-01-09 MED ORDER — ROCURONIUM BROMIDE 100 MG/10ML IV SOLN
INTRAVENOUS | Status: AC
  Filled 2016-01-09: qty 1

## 2016-01-09 MED ORDER — PROPOFOL 10 MG/ML IV BOLUS
INTRAVENOUS | Status: AC
  Filled 2016-01-09: qty 40

## 2016-01-09 MED ORDER — CEFTRIAXONE SODIUM 2 G IJ SOLR
INTRAMUSCULAR | Status: AC
  Filled 2016-01-09: qty 2

## 2016-01-09 MED ORDER — SUGAMMADEX SODIUM 200 MG/2ML IV SOLN
INTRAVENOUS | Status: DC | PRN
  Administered 2016-01-09: 200 mg via INTRAVENOUS

## 2016-01-09 MED ORDER — DEXAMETHASONE SODIUM PHOSPHATE 4 MG/ML IJ SOLN
INTRAMUSCULAR | Status: DC | PRN
  Administered 2016-01-09: 10 mg via INTRAVENOUS

## 2016-01-09 MED ORDER — ONDANSETRON HCL 4 MG/2ML IJ SOLN
INTRAMUSCULAR | Status: AC
Start: 2016-01-09 — End: 2016-01-09
  Filled 2016-01-09: qty 2

## 2016-01-09 MED ORDER — SUCCINYLCHOLINE CHLORIDE 20 MG/ML IJ SOLN
INTRAMUSCULAR | Status: DC | PRN
  Administered 2016-01-09: 100 mg via INTRAVENOUS

## 2016-01-09 MED ORDER — KETOROLAC TROMETHAMINE 30 MG/ML IJ SOLN
INTRAMUSCULAR | Status: DC | PRN
  Administered 2016-01-09: 30 mg via INTRAVENOUS

## 2016-01-09 MED ORDER — DEXTROSE 5 % IV SOLN
2.0000 g | INTRAVENOUS | Status: AC
  Administered 2016-01-09: 2 g via INTRAVENOUS
  Filled 2016-01-09: qty 2

## 2016-01-09 SURGICAL SUPPLY — 27 items
BAG DRAIN URO-CYSTO SKYTR STRL (DRAIN) ×4 IMPLANT
BAG URINE DRAINAGE (UROLOGICAL SUPPLIES) ×4 IMPLANT
BASKET ZERO TIP NITINOL 2.4FR (BASKET) IMPLANT
CATH COUDE FOLEY 2W 5CC 18FR (CATHETERS) ×4 IMPLANT
CATH INTERMIT  6FR 70CM (CATHETERS) ×4 IMPLANT
CLOTH BEACON ORANGE TIMEOUT ST (SAFETY) ×4 IMPLANT
GLOVE BIO SURGEON STRL SZ7.5 (GLOVE) ×4 IMPLANT
GLOVE BIO SURGEON STRL SZ8 (GLOVE) ×4 IMPLANT
GLOVE INDICATOR 7.5 STRL GRN (GLOVE) ×8 IMPLANT
GOWN STRL REUS W/ TWL LRG LVL3 (GOWN DISPOSABLE) ×4 IMPLANT
GOWN STRL REUS W/ TWL XL LVL3 (GOWN DISPOSABLE) ×2 IMPLANT
GOWN STRL REUS W/TWL LRG LVL3 (GOWN DISPOSABLE) ×4
GOWN STRL REUS W/TWL XL LVL3 (GOWN DISPOSABLE) ×2
GUIDEWIRE ANG ZIPWIRE 038X150 (WIRE) ×4 IMPLANT
GUIDEWIRE STR DUAL SENSOR (WIRE) ×4 IMPLANT
HOLDER FOLEY CATH W/STRAP (MISCELLANEOUS) ×4 IMPLANT
IV NS 1000ML (IV SOLUTION) ×2
IV NS 1000ML BAXH (IV SOLUTION) ×2 IMPLANT
IV NS IRRIG 3000ML ARTHROMATIC (IV SOLUTION) ×4 IMPLANT
KIT ROOM TURNOVER WOR (KITS) ×4 IMPLANT
LOOP CUT BIPOLAR 24F LRG (ELECTROSURGICAL) ×4 IMPLANT
MANIFOLD NEPTUNE II (INSTRUMENTS) ×4 IMPLANT
PACK CYSTO (CUSTOM PROCEDURE TRAY) ×4 IMPLANT
STENT URET 6FRX26 CONTOUR (STENTS) ×4 IMPLANT
TUBE CONNECTING 12'X1/4 (SUCTIONS) ×1
TUBE CONNECTING 12X1/4 (SUCTIONS) ×3 IMPLANT
WATER STERILE IRR 3000ML UROMA (IV SOLUTION) ×4 IMPLANT

## 2016-01-09 NOTE — Discharge Instructions (Signed)
Post Anesthesia Home Care Instructions  Activity: Get plenty of rest for the remainder of the day. A responsible adult should stay with you for 24 hours following the procedure.  For the next 24 hours, DO NOT: -Drive a car -Paediatric nurse -Drink alcoholic beverages -Take any medication unless instructed by your physician -Make any legal decisions or sign important papers.  Meals: Start with liquid foods such as gelatin or soup. Progress to regular foods as tolerated. Avoid greasy, spicy, heavy foods. If nausea and/or vomiting occur, drink only clear liquids until the nausea and/or vomiting subsides. Call your physician if vomiting continues.  Special Instructions/Symptoms: Your throat may feel dry or sore from the anesthesia or the breathing tube placed in your throat during surgery. If this causes discomfort, gargle with warm salt water. The discomfort should disappear within 24 hours.  If you had a scopolamine patch placed behind your ear for the management of post- operative nausea and/or vomiting:  1. The medication in the patch is effective for 72 hours, after which it should be removed.  Wrap patch in a tissue and discard in the trash. Wash hands thoroughly with soap and water. 2. You may remove the patch earlier than 72 hours if you experience unpleasant side effects which may include dry mouth, dizziness or visual disturbances. 3. Avoid touching the patch. Wash your hands with soap and water after contact with the patch.   Foley Catheter Care, Adult A Foley catheter is a soft, flexible tube that is placed into the bladder to drain urine. A Foley catheter may be inserted if: 1. You leak urine or are not able to control when you urinate (urinary incontinence). 2. You are not able to urinate when you need to (urinary retention). 3. You had prostate surgery or surgery on the genitals. 4. You have certain medical conditions, such as multiple sclerosis, dementia, or a spinal cord  injury. If you are going home with a Foley catheter in place, follow the instructions below. TAKING CARE OF THE CATHETER 1. Wash your hands with soap and water. 2. Using mild soap and warm water on a clean washcloth:  Clean the area on your body closest to the catheter insertion site using a circular motion, moving away from the catheter. Never wipe toward the catheter because this could sweep bacteria up into the urethra and cause infection.  Remove all traces of soap. Pat the area dry with a clean towel. For males, reposition the foreskin. 3. Attach the catheter to your leg so there is no tension on the catheter. Use adhesive tape or a leg strap. If you are using adhesive tape, remove any sticky residue left behind by the previous tape you used. 4. Keep the drainage bag below the level of the bladder, but keep it off the floor. 5. Check throughout the day to be sure the catheter is working and urine is draining freely. Make sure the tubing does not become kinked. 6. Do not pull on the catheter or try to remove it. Pulling could damage internal tissues. TAKING CARE OF THE DRAINAGE BAGS You will be given two drainage bags to take home. One is a large overnight drainage bag, and the other is a smaller leg bag that fits underneath clothing. You may wear the overnight bag at any time, but you should never wear the smaller leg bag at night. Follow the instructions below for how to empty, change, and clean your drainage bags. Emptying the Drainage Bag You must empty your  drainage bag when it is  - full or at least 2-3 times a day. 1. Wash your hands with soap and water. 2. Keep the drainage bag below your hips, below the level of your bladder. This stops urine from going back into the tubing and into your bladder. 3. Hold the dirty bag over the toilet or a clean container. 4. Open the pour spout at the bottom of the bag and empty the urine into the toilet or container. Do not let the pour spout touch  the toilet, container, or any other surface. Doing so can place bacteria on the bag, which can cause an infection. 5. Clean the pour spout with a gauze pad or cotton ball that has rubbing alcohol on it. 6. Close the pour spout. 7. Attach the bag to your leg with adhesive tape or a leg strap. 8. Wash your hands well. Changing the Drainage Bag Change your drainage bag once a month or sooner if it starts to smell bad or look dirty. Below are steps to follow when changing the drainage bag. 1. Wash your hands with soap and water. 2. Pinch off the rubber catheter so that urine does not spill out. 3. Disconnect the catheter tube from the drainage tube at the connection valve. Do not let the tubes touch any surface. 4. Clean the end of the catheter tube with an alcohol wipe. Use a different alcohol wipe to clean the end of the drainage tube. 5. Connect the catheter tube to the drainage tube of the clean drainage bag. 6. Attach the new bag to the leg with adhesive tape or a leg strap. Avoid attaching the new bag too tightly. 7. Wash your hands well. Cleaning the Drainage Bag 1. Wash your hands with soap and water. 2. Wash the bag in warm, soapy water. 3. Rinse the bag thoroughly with warm water. 4. Fill the bag with a solution of white vinegar and water (1 cup vinegar to 1 qt warm water [.2 L vinegar to 1 L warm water]). Close the bag and soak it for 30 minutes in the solution. 5. Rinse the bag with warm water. 6. Hang the bag to dry with the pour spout open and hanging downward. 7. Store the clean bag (once it is dry) in a clean plastic bag. 8. Wash your hands well. PREVENTING INFECTION  Wash your hands before and after handling your catheter.  Take showers daily and wash the area where the catheter enters your body. Do not take baths. Replace wet leg straps with dry ones, if this applies.  Do not use powders, sprays, or lotions on the genital area. Only use creams, lotions, or ointments as  directed by your caregiver.  For females, wipe from front to back after each bowel movement.  Drink enough fluids to keep your urine clear or pale yellow unless you have a fluid restriction.  Do not let the drainage bag or tubing touch or lie on the floor.  Wear cotton underwear to absorb moisture and to keep your skin drier. SEEK MEDICAL CARE IF:   Your urine is cloudy or smells unusually bad.  Your catheter becomes clogged.  You are not draining urine into the bag or your bladder feels full.  Your catheter starts to leak. SEEK IMMEDIATE MEDICAL CARE IF:   You have pain, swelling, redness, or pus where the catheter enters the body.  You have pain in the abdomen, legs, lower back, or bladder.  You have a fever.  You  see blood fill the catheter, or your urine is pink or red.  You have nausea, vomiting, or chills.  Your catheter gets pulled out. MAKE SURE YOU:   Understand these instructions.  Will watch your condition.  Will get help right away if you are not doing well or get worse.   This information is not intended to replace advice given to you by your health care provider. Make sure you discuss any questions you have with your health care provider.   Document Released: 06/28/2005 Document Revised: 11/12/2013 Document Reviewed: 06/19/2012 Elsevier Interactive Patient Education 2016 [facility] [phone] Post Ureteroscopy With or Without Stent Instructions  Definitions:  Ureter: The duct that transports urine from the kidney to the bladder. Stent:   A plastic hollow tube that is placed into the ureter, from the kidney to the                 bladder to prevent the ureter from swelling shut.  GENERAL INSTRUCTIONS:  Despite the fact that no skin incisions were used, the area around the ureter and bladder is raw and irritated. The stent is a foreign body which will further irritate the bladder wall. This irritation is manifested  by increased frequency of urination, both day and night, and by an increase in the urge to urinate. In some, the urge to urinate is present almost always. Sometimes the urge is strong enough that you may not be able to stop yourself from urinating. The only real cure is to remove the stent and then give time for the bladder wall to heal which can't be done until the danger of the ureter swelling shut has passed, which varies.  You may see some blood in your urine while the stent is in place and a few days afterwards. Do not be alarmed, even if the urine was clear for a while. Get off your feet and drink lots of fluids until clearing occurs. If you start to pass clots or don't improve, call us.  DIET: You may return to your normal diet immediately. Because of the raw surface of your bladder, alcohol, spicy foods, acid type foods and drinks with caffeine may cause irritation or frequency and should be used in moderation. To keep your urine flowing freely and to avoid constipation, drink plenty of fluids during the day ( 8-10 glasses ). Tip: Avoid cranberry juice because it is very acidic.  ACTIVITY: Your physical activity doesn't need to be restricted. However, if you are very active, you may see some blood in your urine. We suggest that you reduce your activity under these circumstances until the bleeding has stopped.  BOWELS: It is important to keep your bowels regular during the postoperative period. Straining with bowel movements can cause bleeding. A bowel movement every other day is reasonable. Use a mild laxative if needed, such as Milk of Magnesia 2-3 tablespoons, or 2 Dulcolax tablets. Call if you continue to have problems. If you have been taking narcotics for pain, before, during or after your surgery, you may be constipated. Take a laxative if necessary.   MEDICATION: You should resume your pre-surgery medications unless told not to. In addition you will often be given an antibiotic to  prevent infection. These should be taken as prescribed until the bottles are finished unless you are having an unusual reaction to one of the drugs.  PROBLEMS YOU SHOULD REPORT TO [place]:  Fevers over 100.5 Fahrenheit.  Heavy bleeding, or clots ( See  above notes about blood in urine ).  Inability to urinate.  Drug reactions ( hives, rash, nausea, vomiting, diarrhea ).  Severe burning or pain with urination that is not improving.  FOLLOW-UP: You will need a follow-up appointment to monitor your progress. Call for this appointment at the number listed above. Usually the first appointment will be about three to fourteen days after your surgery.

## 2016-01-09 NOTE — H&P (Signed)
Urology Admission H&P  Chief Complaint: suprapubic pressure  History of Present Illness: [name] is a 70yo with a hx of bladder cancer her for reresection. He is s/p TURBT for T1G3 5 weeks ago. He had a right stent placed due to ureteral involvement  Past Medical History  Diagnosis Date  . Seasonal allergies   . History of prostate cancer urologist-  dr Alyson Ingles--  last PSA 0.8  in April 2017    dx 2013--  Stage T1c,  Gleason 3+3,  PSA 6.38,  vol 25.41cc--  s/p  radioactive seed implants 08-01-2012  . Wears glasses   . 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 Frequency of urination   . Nocturia    Past Surgical History  Procedure Laterality Date  . 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 Radioactive seed implant  08/01/2012    Procedure: RADIOACTIVE SEED IMPLANT;  Surgeon: Hanley Ben, MD;  Location: [facility];  Service: Urology;  Laterality: N/A;  73 seeds implanted no seeds found in bladder  . Abdominal hernia repair  1990's  . Transurethral resection of bladder tumor N/A 11/03/2015    Procedure: TRANSURETHRAL RESECTION OF BLADDER TUMOR (TURBT)WITH INSERTION STENT;  Surgeon: Cleon Gustin, MD;  Location: [facility];  Service: Urology;  Laterality: N/A;  . Cystoscopy w/ retrogrades N/A 11/03/2015    Procedure: CYSTOSCOPY WITH RETROGRADE PYELOGRAM;  Surgeon: Cleon Gustin, MD;  Location: [facility];  Service: Urology;  Laterality: N/A;    Home Medications:  No prescriptions prior to admission   Allergies: No Known Allergies  History reviewed. No pertinent family history. Social History:  reports that he quit smoking about 14 months ago. His smoking use included Cigarettes. He has a 21 pack-year smoking history. He has never used smokeless tobacco. He  reports that he drinks alcohol. He reports that he does not use illicit drugs.  Review of Systems  All other systems reviewed and are negative.   Physical Exam:  Vital signs in last 24 hours: Temp:  [98.9 F (37.2 C)] 98.9 F (37.2 C) (06/30 1100) Pulse Rate:  [83] 83 (06/30 1100) Resp:  [18] 18 (06/30 1100) BP: (157)/(69) 157/69 mmHg (06/30 1100) SpO2:  [99 %] 99 % (06/30 1100) Weight:  [83.462 kg (184 lb)] 83.462 kg (184 lb) (06/30 1100) Physical Exam  Constitutional: He is oriented to person, place, and time. He appears well-developed and well-nourished.  HENT:  Head: Normocephalic and atraumatic.  Eyes: EOM are normal. Pupils are equal, round, and reactive to light.  Neck: Normal range of motion. No thyromegaly present.  Cardiovascular: Normal rate and regular rhythm.   Respiratory: Effort normal. No respiratory distress.  GI: Soft. He exhibits no distension.  Musculoskeletal: Normal range of motion.  Neurological: He is alert and oriented to person, place, and time.  Skin: Skin is warm and dry.  Psychiatric: He has a normal mood and affect. His behavior is normal. Judgment and thought content normal.    Laboratory Data:  Results for orders placed or performed during the hospital encounter of 01/09/16 (from the past 24 hour(s))  Hemoglobin-hemacue, POC     Status: None   Collection Time: 01/09/16 11:42 AM  Result Value Ref Range   Hemoglobin 13.2 13.0 - 17.0 g/dL   No results  found for this or any previous visit (from the past 240 hour(s)). Creatinine: No results for input(s): CREATININE in the last 168 hours. Baseline Creatinine: unknwon  Impression/Assessment:  69yo with T1G3 bladder cancer  Plan:  The risks/benefits/alternatives to bladder biopsy and stent removal was explained to the patient and he understands and wishes to proceed with srugery  Nicolette Bang 01/09/2016, 12:25 PM

## 2016-01-09 NOTE — Anesthesia Procedure Notes (Signed)
Procedure Name: Intubation Date/Time: 01/09/2016 12:34 PM Performed by: Mechele Claude Pre-anesthesia Checklist: Patient identified, Emergency Drugs available, Suction available and Patient being monitored Patient Re-evaluated:Patient Re-evaluated prior to inductionOxygen Delivery Method: Circle system utilized Preoxygenation: Pre-oxygenation with 100% oxygen Intubation Type: IV induction Ventilation: Mask ventilation without difficulty Laryngoscope Size: Mac and 4 Grade View: Grade I Tube type: Oral Tube size: 8.0 mm Number of attempts: 1 Airway Equipment and Method: Stylet Placement Confirmation: ETT inserted through vocal cords under direct vision,  positive ETCO2 and breath sounds checked- equal and bilateral Secured at: 23 cm Tube secured with: Tape Dental Injury: Teeth and Oropharynx as per pre-operative assessment

## 2016-01-09 NOTE — Anesthesia Postprocedure Evaluation (Signed)
Anesthesia Post Note  Patient: John [name]  Procedure(s) Performed: Procedure(s) (LRB): TRANSURETHRAL RESECTION OF BLADDER TUMOR WITH GYRUS (TURBT-GYRUS) (N/A) CYSTOSCOPY WITH LEFT RETROGRADE PYELOGRAM (Left) CYSTOSCOPY WITH LEFT STENT REMOVAL (Left)  Patient location during evaluation: PACU Anesthesia Type: General Level of consciousness: awake and alert Pain management: pain level controlled Vital Signs Assessment: post-procedure vital signs reviewed and stable Respiratory status: spontaneous breathing, nonlabored ventilation, respiratory function stable and patient connected to nasal cannula oxygen Cardiovascular status: blood pressure returned to baseline and stable Postop Assessment: no signs of nausea or vomiting Anesthetic complications: no    Last Vitals:  Filed Vitals:   01/09/16 1345 01/09/16 1400  BP: 155/65 157/73  Pulse: 84 81  Temp:    Resp: 16 17    Last Pain:  Filed Vitals:   01/09/16 1407  PainSc: 5                  Maliaka Brasington L

## 2016-01-09 NOTE — Anesthesia Preprocedure Evaluation (Signed)
Anesthesia Evaluation  Patient identified by MRN, date of birth, ID band Patient awake    Reviewed: Allergy & Precautions, H&P , NPO status , Patient's Chart, lab work & pertinent test results  Airway Mallampati: II  TM Distance: >3 FB Neck ROM: Full    Dental no notable dental hx. (+) Dental Advisory Given, Teeth Intact   Pulmonary neg pulmonary ROS, former smoker,    Pulmonary exam normal breath sounds clear to auscultation       Cardiovascular Exercise Tolerance: Good negative cardio ROS Normal cardiovascular exam Rhythm:Regular Rate:Normal     Neuro/Psych negative neurological ROS  negative psych ROS   GI/Hepatic negative GI ROS, Neg liver ROS,   Endo/Other  negative endocrine ROS  Renal/GU negative Renal ROS  negative genitourinary   Musculoskeletal negative musculoskeletal ROS (+)   Abdominal   Peds negative pediatric ROS (+)  Hematology negative hematology ROS (+)   Anesthesia Other Findings   Reproductive/Obstetrics negative OB ROS                             Anesthesia Physical Anesthesia Plan  ASA: II  Anesthesia Plan: General   Post-op Pain Management:    Induction: Intravenous  Airway Management Planned: LMA  Additional Equipment:   Intra-op Plan:   Post-operative Plan:   Informed Consent: I have reviewed the patients History and Physical, chart, labs and discussed the procedure including the risks, benefits and alternatives for the proposed anesthesia with the patient or authorized representative who has indicated his/her understanding and acceptance.   Dental Advisory Given  Plan Discussed with: CRNA  Anesthesia Plan Comments:         Anesthesia Quick Evaluation

## 2016-01-09 NOTE — Transfer of Care (Signed)
  Last Vitals:  Filed Vitals:   01/09/16 1100  BP: 157/69  Pulse: 83  Temp: 37.2 C  Resp: 18    Last Pain: There were no vitals filed for this visit.    Patients Stated Pain Goal: 5 (01/09/16 1110) Immediate Anesthesia Transfer of Care Note  Patient: John [name]  Procedure(s) Performed: Procedure(s) (LRB): TRANSURETHRAL RESECTION OF BLADDER TUMOR WITH GYRUS (TURBT-GYRUS) (N/A) CYSTOSCOPY WITH LEFT RETROGRADE PYELOGRAM (Left) CYSTOSCOPY WITH LEFT STENT REMOVAL (Left)  Patient Location: PACU  Anesthesia Type: General  Level of Consciousness: awake, alert  and oriented  Airway & Oxygen Therapy: Patient Spontanous Breathing and Patient connected to nasal cannula oxygen  Post-op Assessment: Report given to PACU RN and Post -op Vital signs reviewed and stable  Post vital signs: Reviewed and stable  Complications: No apparent anesthesia complications

## 2016-01-12 DIAGNOSIS — R310 Gross hematuria: Secondary | ICD-10-CM | POA: Diagnosis not present

## 2016-01-12 NOTE — Op Note (Signed)
Preoperative diagnosis: Bladder Tumor  Postop diagnosis: Same  Procedure: 1.  Cystoscopy 2. Bilateral retrograde pyelography 3. Intra-operative fluoroscopy, under 1 hour, with interpretation 4.Transurethral resection of bladder tumor, medium 5. Left 6x 266 JJ Stent exchange   Attending: Nicolette Bang  Anesthesia: General  Estimated blood loss: 5 cc  Drains: 1. 18 French Foley catheter 2. Left 6x26 JJ ureteral Stent without tether  Specimens: Bladder tumor  Antibiotics: rocephin  Findings: 3cm area of inflammation/residual tumor on left later wall. Normal left retrograde peyelogram  Indications: Patient is a 70 year old with a history of  Grade grade bladder cancer.   After discussing treatment options patient decided to proceed with transurethral resection of bladder tumor  Procedure in detail: Prior to procedure consetn was obtained. Patient was brought to the operating room and briefing was done sure correct patient, correct procedure, correct site.  General anesthesia was in administered patient was placed in the dorsal lithotomy position.  The rigid 70 French cystoscope was passed urethra and bladder.  Bladder was inspected masses or lesions and we noted a 3 cm lesion in the left lateral wall of the bladder.   We then turned our attention to the left ureteral orifice. We removed the stent with a grasper. Removed the cystoscope and placed a 22 French resectoscope in the bladder.  Using bipolar electrocautery were then removed the  tumor.  We removed the bladder tumors down to the base exposing muscle.  We then removed the pieces and sent them for pathology.  To obtain hemostasis we then cauterized the bed of the tumors. Once good hemostasis was noted we removed the scope and reinserted the cystoscope.  The left ureteral orifice was cannulated with a 6 French ureteral catheter.  A gentle retrograde was obtained in findings noted above.  We then placed a zip wire through the ureteral  catheter and advanced up to the renal pelvis. We then placed a 6 x 26 double-J ureteral stent over the wire.  We then removed the wire and good coil was noted in the pelvis under fluoroscopy in the bladder under direct vision.    the bladder was then drained and a 18 French Foley catheter was placed. This concluded the procedure which resulted by the patient.  Complications: None  Condition: Stable,  extubated, transferred to PACU.  Plan: The patient will be discharged home. He will followup in 3 days for a voiding trial then 2 weeks for stent removal

## 2016-01-14 ENCOUNTER — Encounter (HOSPITAL_BASED_OUTPATIENT_CLINIC_OR_DEPARTMENT_OTHER): Payer: Self-pay | Admitting: Urology

## 2016-01-15 ENCOUNTER — Encounter (HOSPITAL_BASED_OUTPATIENT_CLINIC_OR_DEPARTMENT_OTHER): Payer: Self-pay | Admitting: Urology

## 2016-02-02 DIAGNOSIS — C672 Malignant neoplasm of lateral wall of bladder: Secondary | ICD-10-CM | POA: Diagnosis not present

## 2016-02-27 DIAGNOSIS — R310 Gross hematuria: Secondary | ICD-10-CM | POA: Diagnosis not present

## 2016-04-15 DIAGNOSIS — H524 Presbyopia: Secondary | ICD-10-CM | POA: Diagnosis not present

## 2016-04-15 DIAGNOSIS — H40023 Open angle with borderline findings, high risk, bilateral: Secondary | ICD-10-CM | POA: Diagnosis not present

## 2016-05-03 DIAGNOSIS — C672 Malignant neoplasm of lateral wall of bladder: Secondary | ICD-10-CM | POA: Diagnosis not present

## 2016-05-05 ENCOUNTER — Other Ambulatory Visit: Payer: Self-pay | Admitting: Urology

## 2016-05-24 ENCOUNTER — Encounter (HOSPITAL_BASED_OUTPATIENT_CLINIC_OR_DEPARTMENT_OTHER): Payer: Self-pay | Admitting: *Deleted

## 2016-05-24 NOTE — Progress Notes (Signed)
NPO AFTER MN.  ARRIVE AT 0815.  NEEDS HG.   

## 2016-05-28 ENCOUNTER — Encounter (HOSPITAL_BASED_OUTPATIENT_CLINIC_OR_DEPARTMENT_OTHER)
Admission: RE | Disposition: A | Discharge status: Home / Self Care | Payer: Self-pay | Source: Ambulatory Visit | Attending: Urology

## 2016-05-28 ENCOUNTER — Ambulatory Visit (HOSPITAL_BASED_OUTPATIENT_CLINIC_OR_DEPARTMENT_OTHER)
Admission: RE | Admit: 2016-05-28 | Discharge: 2016-05-28 | Disposition: A | Discharge status: Home / Self Care | Payer: Medicare Other | Source: Ambulatory Visit | Attending: Urology | Admitting: Urology

## 2016-05-28 ENCOUNTER — Ambulatory Visit (HOSPITAL_BASED_OUTPATIENT_CLINIC_OR_DEPARTMENT_OTHER): Payer: Medicare Other | Admitting: Anesthesiology

## 2016-05-28 ENCOUNTER — Encounter (HOSPITAL_BASED_OUTPATIENT_CLINIC_OR_DEPARTMENT_OTHER): Payer: Self-pay | Admitting: *Deleted

## 2016-05-28 DIAGNOSIS — Z8546 Personal history of malignant neoplasm of prostate: Secondary | ICD-10-CM | POA: Insufficient documentation

## 2016-05-28 DIAGNOSIS — C679 Malignant neoplasm of bladder, unspecified: Secondary | ICD-10-CM | POA: Diagnosis present

## 2016-05-28 DIAGNOSIS — C674 Malignant neoplasm of posterior wall of bladder: Secondary | ICD-10-CM | POA: Insufficient documentation

## 2016-05-28 DIAGNOSIS — R351 Nocturia: Secondary | ICD-10-CM | POA: Diagnosis not present

## 2016-05-28 DIAGNOSIS — R350 Frequency of micturition: Secondary | ICD-10-CM | POA: Diagnosis not present

## 2016-05-28 DIAGNOSIS — Z87891 Personal history of nicotine dependence: Secondary | ICD-10-CM | POA: Diagnosis not present

## 2016-05-28 HISTORY — PX: CYSTOSCOPY WITH URETHRAL DILATATION: SHX5125

## 2016-05-28 HISTORY — PX: CYSTOSCOPY W/ RETROGRADES: SHX1426

## 2016-05-28 HISTORY — PX: CYSTOSCOPY WITH BIOPSY: SHX5122

## 2016-05-28 LAB — POCT HEMOGLOBIN-HEMACUE: Hemoglobin: 15 g/dL (ref 13.0–17.0)

## 2016-05-28 SURGERY — CYSTOSCOPY, WITH RETROGRADE PYELOGRAM
Anesthesia: General | Site: Urethra

## 2016-05-28 MED ORDER — CEFAZOLIN IN D5W 1 GM/50ML IV SOLN
1.0000 g | INTRAVENOUS | Status: DC
  Filled 2016-05-28: qty 50

## 2016-05-28 MED ORDER — ARTIFICIAL TEARS OP OINT
TOPICAL_OINTMENT | OPHTHALMIC | Status: AC
  Filled 2016-05-28: qty 3.5

## 2016-05-28 MED ORDER — PROPOFOL 10 MG/ML IV BOLUS
INTRAVENOUS | Status: AC
  Filled 2016-05-28: qty 20

## 2016-05-28 MED ORDER — DEXAMETHASONE SODIUM PHOSPHATE 4 MG/ML IJ SOLN
INTRAMUSCULAR | Status: DC | PRN
  Administered 2016-05-28: 10 mg via INTRAVENOUS

## 2016-05-28 MED ORDER — LACTATED RINGERS IV SOLN
INTRAVENOUS | Status: DC
  Administered 2016-05-28 (×2): via INTRAVENOUS
  Filled 2016-05-28: qty 1000

## 2016-05-28 MED ORDER — LIDOCAINE HCL (CARDIAC) 20 MG/ML IV SOLN
INTRAVENOUS | Status: DC | PRN
  Administered 2016-05-28: 60 mg via INTRAVENOUS

## 2016-05-28 MED ORDER — LIDOCAINE 2% (20 MG/ML) 5 ML SYRINGE
INTRAMUSCULAR | Status: AC
  Filled 2016-05-28: qty 5

## 2016-05-28 MED ORDER — CEFAZOLIN SODIUM-DEXTROSE 2-4 GM/100ML-% IV SOLN
2.0000 g | INTRAVENOUS | Status: AC
  Administered 2016-05-28: 2 g via INTRAVENOUS
  Filled 2016-05-28: qty 100

## 2016-05-28 MED ORDER — SUGAMMADEX SODIUM 200 MG/2ML IV SOLN
INTRAVENOUS | Status: DC | PRN
  Administered 2016-05-28: 300 mg via INTRAVENOUS
  Administered 2016-05-28: 100 mg via INTRAVENOUS

## 2016-05-28 MED ORDER — DEXAMETHASONE SODIUM PHOSPHATE 10 MG/ML IJ SOLN
INTRAMUSCULAR | Status: AC
  Filled 2016-05-28: qty 1

## 2016-05-28 MED ORDER — CEFAZOLIN SODIUM-DEXTROSE 2-4 GM/100ML-% IV SOLN
INTRAVENOUS | Status: AC
  Filled 2016-05-28: qty 100

## 2016-05-28 MED ORDER — MIDAZOLAM HCL 2 MG/2ML IJ SOLN
INTRAMUSCULAR | Status: AC
  Filled 2016-05-28: qty 2

## 2016-05-28 MED ORDER — ONDANSETRON HCL 4 MG/2ML IJ SOLN
INTRAMUSCULAR | Status: AC
  Filled 2016-05-28: qty 2

## 2016-05-28 MED ORDER — SODIUM CHLORIDE 0.9 % IR SOLN
Status: DC | PRN
  Administered 2016-05-28: 3000 mL

## 2016-05-28 MED ORDER — PROPOFOL 500 MG/50ML IV EMUL
INTRAVENOUS | Status: AC
  Filled 2016-05-28: qty 50

## 2016-05-28 MED ORDER — OXYCODONE-ACETAMINOPHEN 5-325 MG PO TABS: 1.0000 | ORAL_TABLET | ORAL | 0 refills | Status: DC | PRN

## 2016-05-28 MED ORDER — ROCURONIUM BROMIDE 50 MG/5ML IV SOSY
PREFILLED_SYRINGE | INTRAVENOUS | Status: AC
  Filled 2016-05-28: qty 5

## 2016-05-28 MED ORDER — LIDOCAINE HCL 4 % MT SOLN
OROMUCOSAL | Status: DC | PRN
  Administered 2016-05-28: 2 mL via TOPICAL

## 2016-05-28 MED ORDER — PROPOFOL 10 MG/ML IV BOLUS
INTRAVENOUS | Status: DC | PRN
  Administered 2016-05-28: 250 mg via INTRAVENOUS

## 2016-05-28 MED ORDER — SUGAMMADEX SODIUM 200 MG/2ML IV SOLN
INTRAVENOUS | Status: AC
  Filled 2016-05-28: qty 4

## 2016-05-28 MED ORDER — STERILE WATER FOR IRRIGATION IR SOLN
Status: DC | PRN
  Administered 2016-05-28: 3000 mL

## 2016-05-28 MED ORDER — SUCCINYLCHOLINE CHLORIDE 20 MG/ML IJ SOLN
INTRAMUSCULAR | Status: AC
  Filled 2016-05-28: qty 1

## 2016-05-28 MED ORDER — KETOROLAC TROMETHAMINE 30 MG/ML IJ SOLN
INTRAMUSCULAR | Status: AC
  Filled 2016-05-28: qty 1

## 2016-05-28 MED ORDER — ROCURONIUM BROMIDE 10 MG/ML (PF) SYRINGE
PREFILLED_SYRINGE | INTRAVENOUS | Status: DC | PRN
  Administered 2016-05-28: 40 mg via INTRAVENOUS

## 2016-05-28 MED ORDER — KETOROLAC TROMETHAMINE 30 MG/ML IJ SOLN
INTRAMUSCULAR | Status: DC | PRN
  Administered 2016-05-28: 30 mg via INTRAVENOUS

## 2016-05-28 MED ORDER — FENTANYL CITRATE (PF) 100 MCG/2ML IJ SOLN
INTRAMUSCULAR | Status: AC
  Filled 2016-05-28: qty 4

## 2016-05-28 MED ORDER — FENTANYL CITRATE (PF) 100 MCG/2ML IJ SOLN
INTRAMUSCULAR | Status: DC | PRN
  Administered 2016-05-28 (×2): 50 ug via INTRAVENOUS

## 2016-05-28 MED ORDER — BELLADONNA ALKALOIDS-OPIUM 16.2-60 MG RE SUPP
RECTAL | Status: AC
  Filled 2016-05-28: qty 1

## 2016-05-28 MED ORDER — IOHEXOL 300 MG/ML  SOLN
INTRAMUSCULAR | Status: DC | PRN
  Administered 2016-05-28: 10 mL

## 2016-05-28 MED ORDER — ONDANSETRON HCL 4 MG/2ML IJ SOLN
INTRAMUSCULAR | Status: DC | PRN
  Administered 2016-05-28: 4 mg via INTRAVENOUS

## 2016-05-28 SURGICAL SUPPLY — 34 items
BAG DRAIN URO-CYSTO SKYTR STRL (DRAIN) ×5 IMPLANT
BAG URINE DRAINAGE (UROLOGICAL SUPPLIES) ×5 IMPLANT
BAG URINE LEG 19OZ MD ST LTX (BAG) IMPLANT
BAG URINE LEG 500ML (DRAIN) IMPLANT
BASKET ZERO TIP NITINOL 2.4FR (BASKET) IMPLANT
CATH FOLEY 2WAY SLVR  5CC 22FR (CATHETERS)
CATH FOLEY 2WAY SLVR 30CC 20FR (CATHETERS) IMPLANT
CATH FOLEY 2WAY SLVR 30CC 22FR (CATHETERS) IMPLANT
CATH FOLEY 2WAY SLVR 5CC 22FR (CATHETERS) IMPLANT
CATH FOLEY 3WAY 30CC 22F (CATHETERS) ×5 IMPLANT
CATH INTERMIT  6FR 70CM (CATHETERS) IMPLANT
CLOTH BEACON ORANGE TIMEOUT ST (SAFETY) ×5 IMPLANT
ELECT REM PT RETURN 9FT ADLT (ELECTROSURGICAL) ×5
ELECTRODE REM PT RTRN 9FT ADLT (ELECTROSURGICAL) ×4 IMPLANT
EVACUATOR MICROVAS BLADDER (UROLOGICAL SUPPLIES) IMPLANT
GLOVE BIO SURGEON STRL SZ8 (GLOVE) ×5 IMPLANT
GOWN STRL REUS W/ TWL LRG LVL3 (GOWN DISPOSABLE) ×8 IMPLANT
GOWN STRL REUS W/ TWL XL LVL3 (GOWN DISPOSABLE) ×4 IMPLANT
GOWN STRL REUS W/TWL LRG LVL3 (GOWN DISPOSABLE) ×2
GOWN STRL REUS W/TWL XL LVL3 (GOWN DISPOSABLE) ×1
GUIDEWIRE ANG ZIPWIRE 038X150 (WIRE) IMPLANT
GUIDEWIRE STR DUAL SENSOR (WIRE) ×5 IMPLANT
HOLDER FOLEY CATH W/STRAP (MISCELLANEOUS) ×5 IMPLANT
IV NS IRRIG 3000ML ARTHROMATIC (IV SOLUTION) ×5 IMPLANT
KIT ROOM TURNOVER WOR (KITS) ×5 IMPLANT
MANIFOLD NEPTUNE II (INSTRUMENTS) ×5 IMPLANT
PACK CYSTO (CUSTOM PROCEDURE TRAY) ×5 IMPLANT
PLUG CATH AND CAP STER (CATHETERS) ×5 IMPLANT
SET ASPIRATION TUBING (TUBING) IMPLANT
SYR 20CC LL (SYRINGE) IMPLANT
SYR 30ML LL (SYRINGE) ×5 IMPLANT
SYRINGE IRR TOOMEY STRL 70CC (SYRINGE) IMPLANT
TUBE CONNECTING 12X1/4 (SUCTIONS) IMPLANT
WATER STERILE IRR 3000ML UROMA (IV SOLUTION) ×5 IMPLANT

## 2016-05-28 NOTE — H&P (Signed)
Urology Admission H&P  Chief Complaint: bladder cancer  History of Present Illness: Mr Cadan is a 70yo with a hx of high grade bladder cance rwith a tumor recurrance found on cystoscopy  Past Medical History:  Diagnosis Date  . 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 Frequency of urination   . History of prostate cancer urologist-  dr Alyson Ingles--  last PSA 0.8  in April 2017   dx 2013--  Stage T1c,  Gleason 3+3,  PSA 6.38,  vol 25.41cc--  s/p  radioactive seed implants 08-01-2012  . Nocturia   . Seasonal allergies   . Wears glasses    Past Surgical History:  Procedure Laterality Date  . ABDOMINAL HERNIA REPAIR  1990's  . CYSTOSCOPY W/ RETROGRADES N/A 11/03/2015   Procedure: CYSTOSCOPY WITH RETROGRADE PYELOGRAM;  Surgeon: Cleon Gustin, MD;  Location: [facility];  Service: Urology;  Laterality: N/A;  . CYSTOSCOPY W/ RETROGRADES Left 01/09/2016   Procedure: CYSTOSCOPY WITH LEFT RETROGRADE PYELOGRAM;  Surgeon: Cleon Gustin, MD;  Location: [facility];  Service: Urology;  Laterality: Left;  . CYSTOSCOPY W/ URETERAL STENT REMOVAL Left 01/09/2016   Procedure: CYSTOSCOPY WITH LEFT STENT EXCHANGE;  Surgeon: Cleon Gustin, MD;  Location: [facility];  Service: Urology;  Laterality: Left;  . PROSTATE BIOPSY  05/08/12   Adenocarcinoma  . RADIOACTIVE SEED IMPLANT  08/01/2012   Procedure: RADIOACTIVE SEED IMPLANT;  Surgeon: Hanley Ben, MD;  Location: [place];  Service: Urology;  Laterality: N/A;  73 seeds implanted no seeds found in bladder  . TRANSURETHRAL RESECTION OF BLADDER TUMOR N/A 11/03/2015   Procedure: TRANSURETHRAL RESECTION OF BLADDER TUMOR (TURBT)WITH INSERTION STENT;  Surgeon: Cleon Gustin, MD;  Location: [facility];   Service: Urology;  Laterality: N/A;  . TRANSURETHRAL RESECTION OF BLADDER TUMOR WITH GYRUS (TURBT-GYRUS) N/A 01/09/2016   Procedure: TRANSURETHRAL RESECTION OF BLADDER TUMOR WITH GYRUS (TURBT-GYRUS);  Surgeon: Cleon Gustin, MD;  Location: [facility];  Service: Urology;  Laterality: N/A;    Home Medications:  No prescriptions prior to admission.   Allergies: No Known Allergies  History reviewed. No pertinent family history. Social History:  reports that he quit smoking about 18 months ago. His smoking use included Cigarettes. He has a 21.00 pack-year smoking history. He has never used smokeless tobacco. He reports that he drinks alcohol. He reports that he does not use drugs.  Review of Systems  All other systems reviewed and are negative.   Physical Exam:  Vital signs in last 24 hours: Temp:  [97.9 F (36.6 C)] 97.9 F (36.6 C) (11/17 0726) Pulse Rate:  [68] 68 (11/17 0726) Resp:  [16] 16 (11/17 0726) BP: (145)/(60) 145/60 (11/17 0726) SpO2:  [98 %] 98 % (11/17 0726) Weight:  [82.7 kg (182 lb 5 oz)] 82.7 kg (182 lb 5 oz) (11/17 0726) Physical Exam  Constitutional: He is oriented to person, place, and time. He appears well-developed and well-nourished.  HENT:  Head: Normocephalic and atraumatic.  Eyes: EOM are normal. Pupils are equal, round, and reactive to light.  Neck: Normal range of motion. No thyromegaly present.  Cardiovascular: Normal rate and regular rhythm.   Respiratory: Effort normal. No respiratory distress.  GI: Soft. He exhibits no distension.  Musculoskeletal: Normal range of motion. He  exhibits no edema.  Neurological: He is alert and oriented to person, place, and time.  Skin: Skin is warm and dry.  Psychiatric: He has a normal mood and affect. His behavior is normal. Judgment and thought content normal.    Laboratory Data:  Results for orders placed or performed during the hospital encounter of 05/28/16 (from the past 24 hour(s))   Hemoglobin-hemacue, POC     Status: None   Collection Time: 05/28/16  8:03 AM  Result Value Ref Range   Hemoglobin 15.0 13.0 - 17.0 g/dL   No results found for this or any previous visit (from the past 240 hour(s)). Creatinine: No results for input(s): CREATININE in the last 168 hours. Baseline Creatinine: unknwon  Impression/Assessment:  70yo with bladder cancer  Plan:  The risks/benefits/alternaitves toi TURBT was explained to the patient and he understands and wishes to proceed with surgery  Nicolette Bang 05/28/2016, 9:03 AM

## 2016-05-28 NOTE — Anesthesia Postprocedure Evaluation (Signed)
Anesthesia Post Note  Patient: John [name]  Procedure(s) Performed: Procedure(s) (LRB): CYSTOSCOPY WITH RETROGRADE PYELOGRAM (Bilateral) CYSTOSCOPY WITH BIOPSY (N/A) CYSTOSCOPY WITH URETHRAL DILATATION (N/A)  Patient location during evaluation: PACU Anesthesia Type: General Level of consciousness: awake and alert Pain management: pain level controlled Vital Signs Assessment: post-procedure vital signs reviewed and stable Respiratory status: spontaneous breathing, nonlabored ventilation, respiratory function stable and patient connected to nasal cannula oxygen Cardiovascular status: blood pressure returned to baseline and stable Postop Assessment: no signs of nausea or vomiting Anesthetic complications: no    Last Vitals:  Vitals:   05/28/16 1015 05/28/16 1019  BP: (!) 164/62   Pulse: 62 64  Resp: 12 13  Temp:      Last Pain:  Vitals:   05/28/16 0726  TempSrc: Oral                 Tiajuana Amass

## 2016-05-28 NOTE — Anesthesia Preprocedure Evaluation (Signed)
Anesthesia Evaluation  Patient identified by MRN, date of birth, ID band Patient awake    Reviewed: Allergy & Precautions, H&P , NPO status , Patient's Chart, lab work & pertinent test results  Airway Mallampati: II  TM Distance: >3 FB Neck ROM: Full    Dental no notable dental hx. (+) Dental Advisory Given, Teeth Intact   Pulmonary neg pulmonary ROS, former smoker,    Pulmonary exam normal breath sounds clear to auscultation       Cardiovascular Exercise Tolerance: Good negative cardio ROS Normal cardiovascular exam Rhythm:Regular Rate:Normal     Neuro/Psych negative neurological ROS  negative psych ROS   GI/Hepatic negative GI ROS, Neg liver ROS,   Endo/Other  negative endocrine ROS  Renal/GU negative Renal ROS  negative genitourinary   Musculoskeletal negative musculoskeletal ROS (+)   Abdominal   Peds negative pediatric ROS (+)  Hematology negative hematology ROS (+)   Anesthesia Other Findings   Reproductive/Obstetrics negative OB ROS                             Anesthesia Physical  Anesthesia Plan  ASA: II  Anesthesia Plan: General   Post-op Pain Management:    Induction: Intravenous  Airway Management Planned: LMA  Additional Equipment:   Intra-op Plan:   Post-operative Plan:   Informed Consent: I have reviewed the patients History and Physical, chart, labs and discussed the procedure including the risks, benefits and alternatives for the proposed anesthesia with the patient or authorized representative who has indicated his/her understanding and acceptance.   Dental Advisory Given  Plan Discussed with: CRNA  Anesthesia Plan Comments:         Anesthesia Quick Evaluation

## 2016-05-28 NOTE — Anesthesia Procedure Notes (Signed)
Procedure Name: Intubation Date/Time: 05/28/2016 9:11 AM Performed by: Suzette Battiest Pre-anesthesia Checklist: Patient identified, Emergency Drugs available, Suction available and Patient being monitored Patient Re-evaluated:Patient Re-evaluated prior to inductionOxygen Delivery Method: Circle system utilized Preoxygenation: Pre-oxygenation with 100% oxygen Intubation Type: IV induction Ventilation: Mask ventilation without difficulty Laryngoscope Size: Mac and 4 Grade View: Grade II Tube type: Oral Tube size: 8.0 mm Number of attempts: 1 Airway Equipment and Method: Stylet,  Oral airway and LTA kit utilized Placement Confirmation: ETT inserted through vocal cords under direct vision,  positive ETCO2 and breath sounds checked- equal and bilateral Secured at: 24 cm Tube secured with: Tape Dental Injury: Teeth and Oropharynx as per pre-operative assessment

## 2016-05-28 NOTE — Discharge Instructions (Signed)
Foley Catheter Care, Adult °A Foley catheter is a soft, flexible tube. This tube is placed into your bladder to drain pee (urine). If you go home with this catheter in place, follow the instructions below. °TAKING CARE OF THE CATHETER °1. Wash your hands with soap and water. °2. Put soap and water on a clean washcloth. °¨ Clean the skin where the tube goes into your body. °§ Clean away from the tube site. °§ Never wipe toward the tube. °§ Clean the area using a circular motion. °¨ Remove all the soap. Pat the area dry with a clean towel. For males, reposition the skin that covers the end of the penis (foreskin). °3. Attach the tube to your leg with tape or a leg strap. Do not stretch the tube tight. If you are using tape, remove any stickiness left behind by past tape you used. °4. Keep the drainage bag below your hips. Keep it off the floor. °5. Check your tube during the day. Make sure it is working and draining. Make sure the tube does not curl, twist, or bend. °6. Do not pull on the tube or try to take it out. °TAKING CARE OF THE DRAINAGE BAGS °You will have a large overnight drainage bag and a small leg bag. You may wear the overnight bag any time. Never wear the small bag at night. Follow the directions below. °Emptying the Drainage Bag  °Empty your drainage bag when it is ?-½ full or at least 2-3 times a day. °1. Wash your hands with soap and water. °2. Keep the drainage bag below your hips. °3. Hold the dirty bag over the toilet or clean container. °4. Open the pour spout at the bottom of the bag. Empty the pee into the toilet or container. Do not let the pour spout touch anything. °5. Clean the pour spout with a gauze pad or cotton ball that has rubbing alcohol on it. °6. Close the pour spout. °7. Attach the bag to your leg with tape or a leg strap. °8. Wash your hands well. °Changing the Drainage Bag  °Change your bag once a month or sooner if it starts to smell or look dirty.  °1. Wash your hands with  soap and water. °2. Pinch the rubber tube so that pee does not spill out. °3. Disconnect the catheter tube from the drainage tube at the connection valve. Do not let the tubes touch anything. °4. Clean the end of the catheter tube with an alcohol wipe. Clean the end of a the drainage tube with a different alcohol wipe. °5. Connect the catheter tube to the drainage tube of the clean drainage bag. °6. Attach the new bag to the leg with tape or a leg strap. Avoid attaching the new bag too tightly. °7. Wash your hands well. °Cleaning the Drainage Bag  °1. Wash your hands with soap and water. °2. Wash the bag in warm, soapy water. °3. Rinse the bag with warm water. °4. Fill the bag with a mixture of white vinegar and water (1 cup vinegar to 1 quart warm water [.2 liter vinegar to 1 liter warm water]). Close the bag and soak it for 30 minutes in the solution. °5. Rinse the bag with warm water. °6. Hang the bag to dry with the pour spout open and hanging downward. °7. Store the clean bag (once it is dry) in a clean plastic bag. °8. Wash your hands well. °PREVENT INFECTION °· Wash your hands before and after touching   your tube.  Take showers every day. Wash the skin where the tube enters your body. Do not take baths. Replace wet leg straps with dry ones, if this applies.  Do not use powders, sprays, or lotions on the genital area. Only use creams, lotions, or ointments as told by your doctor.  For females, wipe from front to back after going to the bathroom.  Drink enough fluids to keep your pee clear or pale yellow unless you are told not to have too much fluid (fluid restriction).  Do not let the drainage bag or tubing touch or lie on the floor.  Wear cotton underwear to keep the area dry. GET HELP IF:  Your pee is cloudy or smells unusually bad.  Your tube becomes clogged.  You are not draining pee into the bag or your bladder feels full.  Your tube starts to leak. GET HELP RIGHT AWAY IF:  You  have pain, puffiness (swelling), redness, or yellowish-white fluid (pus) where the tube enters the body.  You have pain in the belly (abdomen), legs, lower back, or bladder.  You have a fever.  You see blood fill the tube, or your pee is pink or red.  You feel sick to your stomach (nauseous), throw up (vomit), or have chills.  Your tube gets pulled out. MAKE SURE YOU:   Understand these instructions.  Will watch your condition.  Will get help right away if you are not doing well or get worse. This information is not intended to replace advice given to you by your health care provider. Make sure you discuss any questions you have with your health care provider. Document Released: 10/23/2012 Document Revised: 07/19/2014 Document Reviewed: 06/14/2015 Elsevier Interactive Patient Education  2017 [place] CARE INSTRUCTIONS  Activity: Rest for the remainder of the day.  Do not drive or operate equipment today.  You may resume normal activities in one to two days as instructed by your physician.   Meals: Drink plenty of liquids and eat light foods such as gelatin or soup this evening.  You may return to a normal meal plan tomorrow.  Return to Work: You may return to work in one to two days or as instructed by your physician.  Special Instructions / Symptoms: Call your physician if any of these symptoms occur:   -persistent or heavy bleeding  -bleeding which continues after first few urination  -large blood clots that are difficult to pass  -urine stream diminishes or stops completely  -fever equal to or higher than 101 degrees Farenheit.  -cloudy urine with a strong, foul odor  -severe pain  Females should always wipe from front to back after elimination.  You may feel some burning pain when you urinate.  This should disappear with time.  Applying moist heat to the lower abdomen or a hot tub bath may help relieve the pain. \   Post Anesthesia Home Care  Instructions  Activity: Get plenty of rest for the remainder of the day. A responsible adult should stay with you for 24 hours following the procedure.  For the next 24 hours, DO NOT: -Drive a car -Paediatric nurse -Drink alcoholic beverages -Take any medication unless instructed by your physician -Make any legal decisions or sign important papers.  Meals: Start with liquid foods such as gelatin or soup. Progress to regular foods as tolerated. Avoid greasy, spicy, heavy foods. If nausea and/or vomiting occur, drink only clear liquids until the nausea and/or vomiting subsides. Call  your physician if vomiting continues.  Special Instructions/Symptoms: Your throat may feel dry or sore from the anesthesia or the breathing tube placed in your throat during surgery. If this causes discomfort, gargle with warm salt water. The discomfort should disappear within 24 hours.  If you had a scopolamine patch placed behind your ear for the management of post- operative nausea and/or vomiting:  1. The medication in the patch is effective for 72 hours, after which it should be removed.  Wrap patch in a tissue and discard in the trash. Wash hands thoroughly with soap and water. 2. You may remove the patch earlier than 72 hours if you experience unpleasant side effects which may include dry mouth, dizziness or visual disturbances. 3. Avoid touching the patch. Wash your hands with soap and water after contact with the patch.

## 2016-05-28 NOTE — Brief Op Note (Signed)
05/28/2016  9:37 AM  PATIENT:  John [name]  70 y.o. male  PRE-OPERATIVE DIAGNOSIS:  BLADDER TUMOR  POST-OPERATIVE DIAGNOSIS:  BLADDER TUMOR  PROCEDURE:  Procedure(s): TRANSURETHRAL RESECTION OF BLADDER TUMOR (TURBT) (N/A) CYSTOSCOPY WITH RETROGRADE PYELOGRAM (Bilateral) CYSTOSCOPY WITH BIOPSY (N/A)  SURGEON:  Surgeon(s) and Role:    * Cleon Gustin, MD - Primary  PHYSICIAN ASSISTANT:   ASSISTANTS: none   ANESTHESIA:   general  EBL:  Total I/O In: 200 [I.V.:200] Out: 10 [Blood:10]  BLOOD ADMINISTERED:none  DRAINS: Urinary Catheter (Foley)   LOCAL MEDICATIONS USED:  NONE  SPECIMEN:  Source of Specimen:  bladder lesions  DISPOSITION OF SPECIMEN:  PATHOLOGY  COUNTS:  YES  TOURNIQUET:  * No tourniquets in log *  DICTATION: .Note written in EPIC  PLAN OF CARE: Discharge to home after PACU  PATIENT DISPOSITION:  PACU - hemodynamically stable.   Delay start of Pharmacological VTE agent (>24hrs) due to surgical blood loss or risk of bleeding: not applicable

## 2016-05-28 NOTE — Transfer of Care (Signed)
  Last Vitals:  Vitals:   05/28/16 0726  BP: (!) 145/60  Pulse: 68  Resp: 16  Temp: 36.6 C    Last Pain:  Vitals:   05/28/16 0726  TempSrc: Oral      Patients Stated Pain Goal: 9 (05/28/16 [id number])  Immediate Anesthesia Transfer of Care Note  Patient: John [name]  Procedure(s) Performed: Procedure(s) (LRB): CYSTOSCOPY WITH RETROGRADE PYELOGRAM (Bilateral) CYSTOSCOPY WITH BIOPSY (N/A) CYSTOSCOPY WITH URETHRAL DILATATION (N/A)  Patient Location: PACU  Anesthesia Type: General  Level of Consciousness: awake, alert  and oriented  Airway & Oxygen Therapy: Patient Spontanous Breathing and Patient connected to nasal cannula oxygen  Post-op Assessment: Report given to PACU RN and Post -op Vital signs reviewed and stable  Post vital signs: Reviewed and stable  Complications: No apparent anesthesia complications

## 2016-05-31 ENCOUNTER — Encounter (HOSPITAL_BASED_OUTPATIENT_CLINIC_OR_DEPARTMENT_OTHER): Payer: Self-pay | Admitting: Urology

## 2016-05-31 NOTE — Op Note (Signed)
Preoperative diagnosis: bladder cancer  Postoperative diagnosis: Same  Procedure: 1 cystoscopy 2. bilateral retrograde pyelography 3. Intraoperative fluoroscopy, under one hour, with interpretation 4. Bladder biopsy with fulgeration  Attending: Nicolette Bang  Anesthesia: General  Estimated blood loss: Minimal  Drains: 20 French foley  Specimens:  Bladder biopsies x 3  Antibiotics: ancef  Findings: Multiple posterior wall erythematous lesions Ureteral orifices in normal anatomic location. No hydronephrosis or filling defects in either collecting system  Indications: Patient is a 70 year old male with a history of high grade noninvasive bladder cancer who was found to have multiple lesions on office cystoscopy. After discussing treatment options, they decided proceed with bladder biopsy and selective cytologies.  Procedure her in detail: The patient was brought to the operating room and a brief timeout was done to ensure correct patient, correct procedure, correct site. General anesthesia was administered patient was placed in dorsal lithotomy position. Their genitalia was then prepped and draped in usual sterile fashion. A rigid 71 French cystoscope was passed in the urethra and the bladder. Bladder was inspected and we noted multiple posterior bladder wall lesions. the ureteral orifices were in the normal orthotopic locations. a 6 french ureteral catheter was then instilled into the left ureteral orifice. a gentle retrograde was obtained and findings noted above. We then turned our attention to the right side. a 6 french ureteral catheter was then instilled into the right ureteral orifice. a gentle retrograde was obtained and findings noted above. We then removed the cystoscope and placed a resectoscope into the bladder. We proceeded to obtain multiple biopsies from the posterior wall where there were areas of erythema. Hemostasis was then obtained with a bugbee. the bladder  was then drained, a 20 French foley was placed and this concluded the procedure which was well tolerated by patient.  Complications: None  Condition: Stable, extubated, transferred to PACU  Plan: Patient will be discharged home and will followup in 5 days for voiding trial

## 2016-06-22 ENCOUNTER — Telehealth: Payer: Self-pay | Admitting: Cardiology

## 2016-06-22 NOTE — Telephone Encounter (Signed)
Received records from [facility] for appointment with Dr Percival Spanish on 07/06/16.  Records given to [facility] (medical records) for Dr Hochrein's schedule on 07/06/16.  lp'

## 2016-07-05 NOTE — Progress Notes (Signed)
Cardiology Office Note   Date:  07/06/2016   ID:  EJ KITTNER, DOB [birth year], MRN [id number]  PCP:  Cyndi Bender, PA-C  Cardiologist:   Minus Breeding, MD  Referring:  Cyndi Bender, PA-C   Chief Complaint  Patient presents with  . Irregular Heart Beat      History of Present Illness: John [name] is a 70 y.o. male who presents for pension evaluation of arrhythmia. He was recently noted to have this on physical exam. An EKG was done and suggested that he might have atrial fibrillation. However, I have been able to review these he had sinus rhythm with Mobitz type I heart block. I don't see any evidence of atrial fibrillation. The patient does not feel any significant dysrhythmias except occasionally at night he might have some palpitations. He's not had any presyncope or syncope. Feels well. He exercises by walking 3 miles almost every day he's done this for years. The patient denies any new symptoms such as chest discomfort, neck or arm discomfort. There has been no new shortness of breath, PND or orthopnea. There have been no reported palpitations, presyncope or syncope.  Past Medical History:  Diagnosis Date  . 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 Frequency of urination   . History of prostate cancer urologist-  dr Alyson Ingles--  last PSA 0.8  in April 2017   dx 2013--  Stage T1c,  Gleason 3+3,  PSA 6.38,  vol 25.41cc--  s/p  radioactive seed implants 08-01-2012  . Hydrocele   . Hypertension   . Mobitz type 1 second degree atrioventricular block   . Nocturia   . Prostate cancer [facility])     Past Surgical History:  Procedure Laterality Date  . ABDOMINAL HERNIA REPAIR  1990's  . CYSTOSCOPY W/ RETROGRADES N/A 11/03/2015   Procedure: CYSTOSCOPY WITH RETROGRADE PYELOGRAM;  Surgeon: Cleon Gustin, MD;  Location:  [facility];  Service: Urology;  Laterality: N/A;  . CYSTOSCOPY W/ RETROGRADES Left 01/09/2016   Procedure: CYSTOSCOPY WITH LEFT RETROGRADE PYELOGRAM;  Surgeon: Cleon Gustin, MD;  Location: [facility];  Service: Urology;  Laterality: Left;  . CYSTOSCOPY W/ RETROGRADES Bilateral 05/28/2016   Procedure: CYSTOSCOPY WITH RETROGRADE PYELOGRAM;  Surgeon: Cleon Gustin, MD;  Location: [facility];  Service: Urology;  Laterality: Bilateral;  . CYSTOSCOPY W/ URETERAL STENT REMOVAL Left 01/09/2016   Procedure: CYSTOSCOPY WITH LEFT STENT EXCHANGE;  Surgeon: Cleon Gustin, MD;  Location: [facility];  Service: Urology;  Laterality: Left;  . CYSTOSCOPY WITH BIOPSY N/A 05/28/2016   Procedure: CYSTOSCOPY WITH BIOPSY;  Surgeon: Cleon Gustin, MD;  Location: [facility];  Service: Urology;  Laterality: N/A;  . CYSTOSCOPY WITH URETHRAL DILATATION N/A 05/28/2016   Procedure: CYSTOSCOPY WITH URETHRAL DILATATION;  Surgeon: Cleon Gustin, MD;  Location: [facility];  Service: Urology;  Laterality: N/A;  . PROSTATE BIOPSY  05/08/12   Adenocarcinoma  . RADIOACTIVE SEED IMPLANT  08/01/2012   Procedure: RADIOACTIVE SEED IMPLANT;  Surgeon: Hanley Ben, MD;  Location: [place];  Service: Urology;  Laterality: N/A;  73 seeds implanted no seeds found in bladder  . TRANSURETHRAL RESECTION OF BLADDER TUMOR N/A 11/03/2015   Procedure: TRANSURETHRAL RESECTION OF BLADDER TUMOR (TURBT)WITH INSERTION STENT;  Surgeon:  Cleon Gustin, MD;  Location: [facility];  Service: Urology;  Laterality: N/A;  . TRANSURETHRAL RESECTION OF BLADDER TUMOR WITH GYRUS (TURBT-GYRUS) N/A 01/09/2016   Procedure: TRANSURETHRAL RESECTION OF BLADDER TUMOR WITH GYRUS (TURBT-GYRUS);  Surgeon: Cleon Gustin, MD;  Location: [facility];  Service: Urology;  Laterality: N/A;     Current  Outpatient Prescriptions  Medication Sig Dispense Refill  . aspirin EC 81 MG tablet Take 81 mg by mouth daily.    . chlorthalidone (HYGROTON) 25 MG tablet Take 1 tablet (25 mg total) by mouth daily. 30 tablet 11   No current facility-administered medications for this visit.     Allergies:   Patient has no known allergies.    Social History:  The patient  reports that he quit smoking about 20 months ago. His smoking use included Cigarettes. He has a 21.00 pack-year smoking history. He has never used smokeless tobacco. He reports that he drinks alcohol. He reports that he does not use drugs.   Family History:  The patient's family history includes Aneurysm in his father; Asthma in his mother and sister; Heart failure in his mother; Hyperlipidemia in his mother and sister.    ROS:  Please see the history of present illness.   Otherwise, review of systems are positive for none.   All other systems are reviewed and negative.    PHYSICAL EXAM: VS:  BP (!) 174/84 (BP Location: Left Arm, Patient Position: Sitting, Cuff Size: Normal)   Pulse 83   Ht 6' (1.829 m)   Wt 192 lb (87.1 kg)   SpO2 98%   BMI 26.04 kg/m  , BMI Body mass index is 26.04 kg/m. GENERAL:  Well appearing HEENT:  Pupils equal round and reactive, fundi not visualized, oral mucosa unremarkable NECK:  No jugular venous distention, waveform within normal limits, carotid upstroke brisk and symmetric, no bruits, no thyromegaly LYMPHATICS:  No cervical, inguinal adenopathy LUNGS:  Clear to auscultation bilaterally BACK:  No CVA tenderness CHEST:  Unremarkable HEART:  PMI not displaced or sustained,S1 and S2 within normal limits, no S3, no S4, no clicks, no rubs, 3/6 diastolic murmur heard best at the left third intercostal space murmurs ABD:  Flat, positive bowel sounds normal in frequency in pitch, no bruits, no rebound, no guarding, no midline pulsatile mass, no hepatomegaly, no splenomegaly EXT:  2 plus pulses throughout, no  edema, no cyanosis no clubbing SKIN:  No rashes no nodules NEURO:  Cranial nerves II through XII grossly intact, motor grossly intact throughout PSYCH:  Cognitively intact, oriented to person place and time    EKG:  EKG is not ordered today. The ekg ordered 11/29/17demonstrates sinus rhythm with first-degree AV block with Mobitz type I. No acute ST-T wave changes.   Recent Labs: 05/28/2016: Hemoglobin 15.0    Lipid Panel No results found for: CHOL, TRIG, HDL, CHOLHDL, VLDL, LDLCALC, LDLDIRECT    Wt Readings from Last 3 Encounters:  07/06/16 192 lb (87.1 kg)  05/28/16 182 lb 5 oz (82.7 kg)  01/09/16 184 lb (83.5 kg)      Other studies Reviewed: Additional studies/ records that were reviewed today include: Office records. Review of the above records demonstrates:  Please see elsewhere in the note.     ASSESSMENT AND PLAN:  ABNORMAL EKG:  There was not evidence of atrial fibrillation. He does have Mobitz type I heart block. I will put a 24-hour Holter monitor on that this point don't suspect any change  in therapy would be necessary. He's not having any symptoms.  HTN:   His blood pressure is elevated on multiple readings. I will start chlorthalidone 25 mg daily. Checked his labs and primary care office and she is creatinine is fine. Potassium was 4.4.  AI:  The patient has a murmur consistent with aortic insufficiency. I would not suspect this is very low based on exam but I will start with an echocardiogram.   Current medicines are reviewed at length with the patient today.  The patient does not have concerns regarding medicines.  The following changes have been made:  no change  Labs/ tests ordered today include:   Orders Placed This Encounter  Procedures  . Holter monitor - 24 hour  . ECHOCARDIOGRAM COMPLETE     Disposition:   FU with me in six months.      Signed, Minus Breeding, MD  07/06/2016 10:08 AM    [place]

## 2016-07-06 ENCOUNTER — Encounter (INDEPENDENT_AMBULATORY_CARE_PROVIDER_SITE_OTHER): Payer: Self-pay

## 2016-07-06 ENCOUNTER — Ambulatory Visit (INDEPENDENT_AMBULATORY_CARE_PROVIDER_SITE_OTHER): Payer: Medicare Other | Admitting: Cardiology

## 2016-07-06 VITALS — BP 174/84 | HR 83 | Ht 72.0 in | Wt 192.0 lb

## 2016-07-06 DIAGNOSIS — I351 Nonrheumatic aortic (valve) insufficiency: Secondary | ICD-10-CM | POA: Diagnosis not present

## 2016-07-06 DIAGNOSIS — I10 Essential (primary) hypertension: Secondary | ICD-10-CM | POA: Diagnosis not present

## 2016-07-06 DIAGNOSIS — I441 Atrioventricular block, second degree: Secondary | ICD-10-CM | POA: Diagnosis not present

## 2016-07-06 MED ORDER — CHLORTHALIDONE 25 MG PO TABS: 25.0000 mg | ORAL_TABLET | Freq: Every day | ORAL | 11 refills | Status: DC

## 2016-07-06 NOTE — Patient Instructions (Addendum)
Medication Instructions:  START- Chlorthalidone 25 mg daily  Labwork: None Ordered  Testing/Procedures: Your physician has requested that you have an echocardiogram. Echocardiography is a painless test that uses sound waves to create images of your heart. It provides your doctor with information about the size and shape of your heart and how well your heart's chambers and valves are working. This procedure takes approximately one hour. There are no restrictions for this procedure.  Your physician has recommended that you wear a 24 hour holter monitor. Holter monitors are medical devices that record the heart's electrical activity. Doctors most often use these monitors to diagnose arrhythmias. Arrhythmias are problems with the speed or rhythm of the heartbeat. The monitor is a small, portable device. You can wear one while you do your normal daily activities. This is usually used to diagnose what is causing palpitations/syncope (passing out).   Follow-Up: Your physician wants you to follow-up in: 6 Months. You will receive a reminder letter in the mail two months in advance. If you don't receive a letter, please call our office to schedule the follow-up appointment.   Any Other Special Instructions Will Be Listed Below (If Applicable).         [place]   If you need a refill on your cardiac medications before your next appointment, please call your pharmacy.

## 2016-07-08 ENCOUNTER — Ambulatory Visit (INDEPENDENT_AMBULATORY_CARE_PROVIDER_SITE_OTHER): Payer: Medicare Other

## 2016-07-08 DIAGNOSIS — I441 Atrioventricular block, second degree: Secondary | ICD-10-CM

## 2016-07-14 ENCOUNTER — Telehealth: Payer: Self-pay | Admitting: Cardiology

## 2016-07-14 NOTE — Telephone Encounter (Signed)
Spoke with [facility] representative Holter was ordered to eval for heart block - [facility] rep said monitor shows 2nd degree AVB Monitor report is uploaded to share drive for view  Routed to Theodis Blaze, Dr. Percival Spanish

## 2016-07-23 ENCOUNTER — Ambulatory Visit (HOSPITAL_COMMUNITY): Payer: Medicare HMO | Attending: Cardiology

## 2016-07-23 ENCOUNTER — Other Ambulatory Visit: Payer: Self-pay

## 2016-07-23 DIAGNOSIS — I119 Hypertensive heart disease without heart failure: Secondary | ICD-10-CM | POA: Insufficient documentation

## 2016-07-23 DIAGNOSIS — I351 Nonrheumatic aortic (valve) insufficiency: Secondary | ICD-10-CM

## 2016-07-23 DIAGNOSIS — I359 Nonrheumatic aortic valve disorder, unspecified: Secondary | ICD-10-CM | POA: Diagnosis present

## 2016-08-04 DIAGNOSIS — R0781 Pleurodynia: Secondary | ICD-10-CM | POA: Diagnosis not present

## 2016-08-04 DIAGNOSIS — Z9181 History of falling: Secondary | ICD-10-CM | POA: Diagnosis not present

## 2016-08-04 DIAGNOSIS — E663 Overweight: Secondary | ICD-10-CM | POA: Diagnosis not present

## 2016-08-04 DIAGNOSIS — Z6825 Body mass index (BMI) 25.0-25.9, adult: Secondary | ICD-10-CM | POA: Diagnosis not present

## 2016-08-05 ENCOUNTER — Other Ambulatory Visit: Payer: Self-pay | Admitting: Family Medicine

## 2016-08-05 ENCOUNTER — Ambulatory Visit
Admission: RE | Admit: 2016-08-05 | Discharge: 2016-08-05 | Disposition: A | Discharge status: Home / Self Care | Payer: Medicare HMO | Source: Ambulatory Visit | Attending: Family Medicine | Admitting: Family Medicine

## 2016-08-05 DIAGNOSIS — R0781 Pleurodynia: Secondary | ICD-10-CM

## 2016-08-19 DIAGNOSIS — Z8551 Personal history of malignant neoplasm of bladder: Secondary | ICD-10-CM | POA: Diagnosis not present

## 2016-09-11 DIAGNOSIS — R69 Illness, unspecified: Secondary | ICD-10-CM | POA: Diagnosis not present

## 2016-09-27 DIAGNOSIS — R69 Illness, unspecified: Secondary | ICD-10-CM | POA: Diagnosis not present

## 2016-11-23 DIAGNOSIS — C61 Malignant neoplasm of prostate: Secondary | ICD-10-CM | POA: Diagnosis not present

## 2016-11-23 DIAGNOSIS — C672 Malignant neoplasm of lateral wall of bladder: Secondary | ICD-10-CM | POA: Diagnosis not present

## 2016-11-25 DIAGNOSIS — I10 Essential (primary) hypertension: Secondary | ICD-10-CM | POA: Diagnosis not present

## 2016-11-25 DIAGNOSIS — I441 Atrioventricular block, second degree: Secondary | ICD-10-CM | POA: Diagnosis not present

## 2016-11-25 DIAGNOSIS — Z1389 Encounter for screening for other disorder: Secondary | ICD-10-CM | POA: Diagnosis not present

## 2016-11-25 DIAGNOSIS — M2550 Pain in unspecified joint: Secondary | ICD-10-CM | POA: Diagnosis not present

## 2016-11-25 DIAGNOSIS — C679 Malignant neoplasm of bladder, unspecified: Secondary | ICD-10-CM | POA: Diagnosis not present

## 2016-12-31 DIAGNOSIS — M47896 Other spondylosis, lumbar region: Secondary | ICD-10-CM | POA: Diagnosis not present

## 2017-02-25 DIAGNOSIS — C672 Malignant neoplasm of lateral wall of bladder: Secondary | ICD-10-CM | POA: Diagnosis not present

## 2017-05-26 ENCOUNTER — Other Ambulatory Visit: Payer: Self-pay | Admitting: Cardiology

## 2017-05-26 DIAGNOSIS — C61 Malignant neoplasm of prostate: Secondary | ICD-10-CM | POA: Diagnosis not present

## 2017-06-06 DIAGNOSIS — C61 Malignant neoplasm of prostate: Secondary | ICD-10-CM | POA: Diagnosis not present

## 2017-06-06 DIAGNOSIS — C672 Malignant neoplasm of lateral wall of bladder: Secondary | ICD-10-CM | POA: Diagnosis not present

## 2017-06-08 DIAGNOSIS — I10 Essential (primary) hypertension: Secondary | ICD-10-CM | POA: Diagnosis not present

## 2017-06-08 DIAGNOSIS — Z6825 Body mass index (BMI) 25.0-25.9, adult: Secondary | ICD-10-CM | POA: Diagnosis not present

## 2017-06-08 DIAGNOSIS — N529 Male erectile dysfunction, unspecified: Secondary | ICD-10-CM | POA: Diagnosis not present

## 2017-06-08 DIAGNOSIS — C679 Malignant neoplasm of bladder, unspecified: Secondary | ICD-10-CM | POA: Diagnosis not present

## 2017-06-08 DIAGNOSIS — Z23 Encounter for immunization: Secondary | ICD-10-CM | POA: Diagnosis not present

## 2017-06-08 DIAGNOSIS — Z79899 Other long term (current) drug therapy: Secondary | ICD-10-CM | POA: Diagnosis not present

## 2017-06-08 DIAGNOSIS — C61 Malignant neoplasm of prostate: Secondary | ICD-10-CM | POA: Diagnosis not present

## 2017-06-08 DIAGNOSIS — R7303 Prediabetes: Secondary | ICD-10-CM | POA: Diagnosis not present

## 2017-06-08 DIAGNOSIS — Z0000 Encounter for general adult medical examination without abnormal findings: Secondary | ICD-10-CM | POA: Diagnosis not present

## 2017-06-22 DIAGNOSIS — R42 Dizziness and giddiness: Secondary | ICD-10-CM | POA: Diagnosis not present

## 2017-06-22 DIAGNOSIS — I10 Essential (primary) hypertension: Secondary | ICD-10-CM | POA: Diagnosis not present

## 2017-07-04 DIAGNOSIS — C679 Malignant neoplasm of bladder, unspecified: Secondary | ICD-10-CM | POA: Insufficient documentation

## 2017-07-04 DIAGNOSIS — R351 Nocturia: Secondary | ICD-10-CM | POA: Insufficient documentation

## 2017-07-04 DIAGNOSIS — I441 Atrioventricular block, second degree: Secondary | ICD-10-CM | POA: Insufficient documentation

## 2017-07-04 DIAGNOSIS — I119 Hypertensive heart disease without heart failure: Secondary | ICD-10-CM | POA: Insufficient documentation

## 2017-07-04 DIAGNOSIS — N433 Hydrocele, unspecified: Secondary | ICD-10-CM | POA: Insufficient documentation

## 2017-07-04 DIAGNOSIS — R350 Frequency of micturition: Secondary | ICD-10-CM | POA: Insufficient documentation

## 2017-07-04 DIAGNOSIS — Z8546 Personal history of malignant neoplasm of prostate: Secondary | ICD-10-CM | POA: Insufficient documentation

## 2017-07-11 ENCOUNTER — Ambulatory Visit: Payer: Medicare HMO | Admitting: Cardiology

## 2017-07-11 ENCOUNTER — Encounter: Payer: Self-pay | Admitting: *Deleted

## 2017-07-13 DIAGNOSIS — I10 Essential (primary) hypertension: Secondary | ICD-10-CM | POA: Diagnosis not present

## 2017-07-13 DIAGNOSIS — R609 Edema, unspecified: Secondary | ICD-10-CM | POA: Diagnosis not present

## 2017-07-13 DIAGNOSIS — Z6826 Body mass index (BMI) 26.0-26.9, adult: Secondary | ICD-10-CM | POA: Diagnosis not present

## 2017-07-26 DIAGNOSIS — H2513 Age-related nuclear cataract, bilateral: Secondary | ICD-10-CM | POA: Diagnosis not present

## 2017-07-27 DIAGNOSIS — Z0100 Encounter for examination of eyes and vision without abnormal findings: Secondary | ICD-10-CM | POA: Diagnosis not present

## 2017-08-10 ENCOUNTER — Ambulatory Visit (INDEPENDENT_AMBULATORY_CARE_PROVIDER_SITE_OTHER): Payer: Medicare HMO | Admitting: Cardiology

## 2017-08-10 ENCOUNTER — Encounter: Payer: Self-pay | Admitting: Cardiology

## 2017-08-10 VITALS — BP 136/50 | HR 74 | Resp 16 | Ht 72.0 in | Wt 180.4 lb

## 2017-08-10 DIAGNOSIS — I441 Atrioventricular block, second degree: Secondary | ICD-10-CM

## 2017-08-10 DIAGNOSIS — Z8546 Personal history of malignant neoplasm of prostate: Secondary | ICD-10-CM

## 2017-08-10 DIAGNOSIS — I351 Nonrheumatic aortic (valve) insufficiency: Secondary | ICD-10-CM

## 2017-08-10 DIAGNOSIS — I119 Hypertensive heart disease without heart failure: Secondary | ICD-10-CM | POA: Diagnosis not present

## 2017-08-10 NOTE — Assessment & Plan Note (Signed)
Seed implants 2014

## 2017-08-10 NOTE — Patient Instructions (Signed)
Continue same medications    Your physician wants you to follow-up in: 1 year with Dr.Hochrein. You will receive a reminder letter in the mail two months in advance. If you don't receive a letter, please call our office to schedule the follow-up appointment.

## 2017-08-10 NOTE — Progress Notes (Signed)
08/10/2017 John [name]   Dec 23, 1945  [id number]  Primary Physician Cyndi Bender, PA-C Primary Cardiologist: Dr Percival Spanish  HPI:  72 y.o. AA male who ws sen for evaluation of arrhythmia in Dec 2017. He had been noted to have an abnormal HR on physical exam. An EKG was done and suggested that he might have atrial fibrillation. However, after review it was determined he had sinus rhythm with Mobitz type I heart block. There was no evidence of atrial fibrillation.  Echo done Nov 2018 showed good LVF with moderate LVH, grade 1 DD, and moderate AR. The patient does not feel any significant palpitation or dyspnea. He was initially placed on chlorthalidone for HTN but he says it made him dizzy and his PCP changed him to Norvasc 5 mg. He is in the office today for a one year check up.    Current Outpatient Medications  Medication Sig Dispense Refill  . amLODipine (NORVASC) 5 MG tablet TAKE 1 TABLET BY MOUTH EVERY DAY FOR BLOOD PRESSURE. **REPLACES CHLORTHALIDONE**  1  . aspirin EC 81 MG tablet Take 81 mg by mouth daily.    . sildenafil (VIAGRA) 100 MG tablet TAKE 1 TABLET BY MOUTH 1 - 4 HOURS BEFORE NEEDED FOR ED  11   No current facility-administered medications for this visit.     No Known Allergies  Past Medical History:  Diagnosis Date  . 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 Frequency of urination   . History of prostate cancer urologist-  dr Alyson Ingles--  last PSA 0.8  in April 2017   dx 2013--  Stage T1c,  Gleason 3+3,  PSA 6.38,  vol 25.41cc--  s/p  radioactive seed implants 08-01-2012  . Hydrocele   . Hypertension   . Mobitz type 1 second degree atrioventricular block   . Nocturia   . Prostate cancer [facility])     Social History   Socioeconomic History  . Marital status: Single    Spouse name: Not on file  . Number  of children: Not on file  . Years of education: Not on file  . Highest education level: Not on file  Social Needs  . Financial resource strain: Not on file  . Food insecurity - worry: Not on file  . Food insecurity - inability: Not on file  . Transportation needs - medical: Not on file  . Transportation needs - non-medical: Not on file  Occupational History  . Not on file  Tobacco Use  . Smoking status: Former Smoker    Packs/day: 0.50    Years: 42.00    Pack years: 21.00    Types: Cigarettes    Last attempt to quit: 10/29/2014    Years since quitting: 2.7  . Smokeless tobacco: Never Used  Substance and Sexual Activity  . Alcohol use: Yes    Comment: occasionally blended whiskey   . Drug use: No  . Sexual activity: Not on file  Other Topics Concern  . Not on file  Social History Narrative   Retired.  Lives alone.       Family History  Problem Relation Age of Onset  . Hyperlipidemia Mother   . Asthma Mother   . Heart failure Mother   . Aneurysm Father   . Hyperlipidemia Sister   . Asthma Sister  Review of Systems: General: negative for chills, fever, night sweats or weight changes.  Cardiovascular: negative for chest pain, dyspnea on exertion, edema, orthopnea, palpitations, paroxysmal nocturnal dyspnea or shortness of breath Dermatological: negative for rash Respiratory: negative for cough or wheezing Urologic: negative for hematuria Abdominal: negative for nausea, vomiting, diarrhea, bright red blood per rectum, melena, or hematemesis Neurologic: negative for visual changes, syncope, or dizziness All other systems reviewed and are otherwise negative except as noted above.    Blood pressure (!) 136/50, pulse 74, resp. rate 16, height 6' (1.829 m), weight 180 lb 6.4 oz (81.8 kg), SpO2 96 %.  General appearance: alert, cooperative and no distress Neck: no carotid bruit and no JVD Lungs: clear to auscultation bilaterally Heart: regular rate and  rhythm Extremities: extremities normal, atraumatic, no cyanosis or edema Skin: Skin color, texture, turgor normal. No rashes or lesions Neurologic: Grossly normal  EKG NSR, Mobitz type 1 2nd degree AVB  ASSESSMENT AND PLAN:   Mobitz type 1 second degree atrioventricular block Stable- no symptoms  Hypertensive cardiovascular disease Normal LVF, moderate LVH, grade 1 DD by echo Jan 2018 He had some mild side effects from chlorthalidone but seems to be doing well on Norvasc  History of prostate cancer Seed implants 2014   PLAN  Same Rx- f/u in one year. He knows to call if he has syncope or near syncope.  Kerin Ransom PA-C 08/10/2017 3:40 PM

## 2017-08-10 NOTE — Assessment & Plan Note (Signed)
Stable no symptoms. 

## 2017-08-10 NOTE — Assessment & Plan Note (Signed)
Normal LVF, moderate LVH, grade 1 DD by echo Jan 2018 He had some mild side effects from chlorthalidone but seems to be doing well on Norvasc

## 2017-10-27 DIAGNOSIS — C672 Malignant neoplasm of lateral wall of bladder: Secondary | ICD-10-CM | POA: Diagnosis not present

## 2017-11-07 ENCOUNTER — Other Ambulatory Visit: Payer: Self-pay | Admitting: Physician Assistant

## 2017-11-07 ENCOUNTER — Ambulatory Visit
Admission: RE | Admit: 2017-11-07 | Discharge: 2017-11-07 | Disposition: A | Discharge status: Home / Self Care | Payer: Medicare HMO | Source: Ambulatory Visit | Attending: Physician Assistant | Admitting: Physician Assistant

## 2017-11-07 DIAGNOSIS — R1032 Left lower quadrant pain: Secondary | ICD-10-CM

## 2017-11-07 DIAGNOSIS — C679 Malignant neoplasm of bladder, unspecified: Secondary | ICD-10-CM | POA: Diagnosis not present

## 2017-11-07 DIAGNOSIS — Z9181 History of falling: Secondary | ICD-10-CM | POA: Diagnosis not present

## 2017-11-07 DIAGNOSIS — R112 Nausea with vomiting, unspecified: Secondary | ICD-10-CM | POA: Diagnosis not present

## 2017-11-07 DIAGNOSIS — Z139 Encounter for screening, unspecified: Secondary | ICD-10-CM | POA: Diagnosis not present

## 2017-11-08 DIAGNOSIS — I10 Essential (primary) hypertension: Secondary | ICD-10-CM | POA: Diagnosis not present

## 2017-11-08 DIAGNOSIS — Z79899 Other long term (current) drug therapy: Secondary | ICD-10-CM | POA: Diagnosis not present

## 2017-11-08 DIAGNOSIS — R1032 Left lower quadrant pain: Secondary | ICD-10-CM | POA: Diagnosis not present

## 2017-11-08 DIAGNOSIS — R109 Unspecified abdominal pain: Secondary | ICD-10-CM | POA: Diagnosis not present

## 2017-11-08 DIAGNOSIS — R630 Anorexia: Secondary | ICD-10-CM | POA: Diagnosis not present

## 2017-11-08 DIAGNOSIS — R1110 Vomiting, unspecified: Secondary | ICD-10-CM | POA: Diagnosis not present

## 2017-11-08 DIAGNOSIS — I700 Atherosclerosis of aorta: Secondary | ICD-10-CM | POA: Diagnosis not present

## 2017-11-08 DIAGNOSIS — N12 Tubulo-interstitial nephritis, not specified as acute or chronic: Secondary | ICD-10-CM | POA: Diagnosis not present

## 2017-11-08 DIAGNOSIS — Z87891 Personal history of nicotine dependence: Secondary | ICD-10-CM | POA: Diagnosis not present

## 2017-11-23 DIAGNOSIS — R1032 Left lower quadrant pain: Secondary | ICD-10-CM | POA: Diagnosis not present

## 2017-11-23 DIAGNOSIS — N12 Tubulo-interstitial nephritis, not specified as acute or chronic: Secondary | ICD-10-CM | POA: Diagnosis not present

## 2017-11-23 DIAGNOSIS — Z86718 Personal history of other venous thrombosis and embolism: Secondary | ICD-10-CM | POA: Diagnosis not present

## 2017-11-23 DIAGNOSIS — I499 Cardiac arrhythmia, unspecified: Secondary | ICD-10-CM | POA: Diagnosis not present

## 2017-11-23 DIAGNOSIS — Z87891 Personal history of nicotine dependence: Secondary | ICD-10-CM | POA: Diagnosis not present

## 2017-11-23 DIAGNOSIS — I10 Essential (primary) hypertension: Secondary | ICD-10-CM | POA: Diagnosis not present

## 2017-11-23 DIAGNOSIS — R1110 Vomiting, unspecified: Secondary | ICD-10-CM | POA: Diagnosis not present

## 2017-11-23 DIAGNOSIS — K2901 Acute gastritis with bleeding: Secondary | ICD-10-CM | POA: Diagnosis not present

## 2017-11-23 DIAGNOSIS — K859 Acute pancreatitis without necrosis or infection, unspecified: Secondary | ICD-10-CM | POA: Diagnosis not present

## 2017-11-23 DIAGNOSIS — K922 Gastrointestinal hemorrhage, unspecified: Secondary | ICD-10-CM | POA: Diagnosis not present

## 2017-11-23 DIAGNOSIS — I4430 Unspecified atrioventricular block: Secondary | ICD-10-CM | POA: Diagnosis not present

## 2017-11-24 DIAGNOSIS — K920 Hematemesis: Secondary | ICD-10-CM | POA: Diagnosis not present

## 2017-11-24 DIAGNOSIS — I10 Essential (primary) hypertension: Secondary | ICD-10-CM | POA: Diagnosis not present

## 2017-11-24 DIAGNOSIS — R109 Unspecified abdominal pain: Secondary | ICD-10-CM | POA: Diagnosis not present

## 2017-11-24 DIAGNOSIS — K922 Gastrointestinal hemorrhage, unspecified: Secondary | ICD-10-CM | POA: Diagnosis not present

## 2017-11-25 DIAGNOSIS — K2970 Gastritis, unspecified, without bleeding: Secondary | ICD-10-CM | POA: Diagnosis not present

## 2017-11-25 DIAGNOSIS — K922 Gastrointestinal hemorrhage, unspecified: Secondary | ICD-10-CM | POA: Diagnosis not present

## 2017-11-25 DIAGNOSIS — K921 Melena: Secondary | ICD-10-CM | POA: Diagnosis not present

## 2017-11-25 DIAGNOSIS — K2930 Chronic superficial gastritis without bleeding: Secondary | ICD-10-CM | POA: Diagnosis not present

## 2017-11-25 DIAGNOSIS — I440 Atrioventricular block, first degree: Secondary | ICD-10-CM | POA: Diagnosis not present

## 2017-11-25 DIAGNOSIS — I10 Essential (primary) hypertension: Secondary | ICD-10-CM | POA: Diagnosis not present

## 2017-11-25 DIAGNOSIS — K920 Hematemesis: Secondary | ICD-10-CM | POA: Diagnosis not present

## 2017-11-30 DIAGNOSIS — K922 Gastrointestinal hemorrhage, unspecified: Secondary | ICD-10-CM | POA: Diagnosis not present

## 2017-11-30 DIAGNOSIS — R634 Abnormal weight loss: Secondary | ICD-10-CM | POA: Diagnosis not present

## 2017-11-30 DIAGNOSIS — Z6822 Body mass index (BMI) 22.0-22.9, adult: Secondary | ICD-10-CM | POA: Diagnosis not present

## 2017-12-14 DIAGNOSIS — K922 Gastrointestinal hemorrhage, unspecified: Secondary | ICD-10-CM | POA: Diagnosis not present

## 2017-12-14 DIAGNOSIS — R634 Abnormal weight loss: Secondary | ICD-10-CM | POA: Diagnosis not present

## 2017-12-14 DIAGNOSIS — Z1331 Encounter for screening for depression: Secondary | ICD-10-CM | POA: Diagnosis not present

## 2017-12-28 DIAGNOSIS — D649 Anemia, unspecified: Secondary | ICD-10-CM | POA: Diagnosis not present

## 2018-01-23 DIAGNOSIS — C61 Malignant neoplasm of prostate: Secondary | ICD-10-CM | POA: Diagnosis not present

## 2018-01-27 DIAGNOSIS — R350 Frequency of micturition: Secondary | ICD-10-CM | POA: Diagnosis not present

## 2018-01-27 DIAGNOSIS — C61 Malignant neoplasm of prostate: Secondary | ICD-10-CM | POA: Diagnosis not present

## 2018-01-27 DIAGNOSIS — C672 Malignant neoplasm of lateral wall of bladder: Secondary | ICD-10-CM | POA: Diagnosis not present

## 2018-01-27 DIAGNOSIS — N401 Enlarged prostate with lower urinary tract symptoms: Secondary | ICD-10-CM | POA: Diagnosis not present

## 2018-03-21 DIAGNOSIS — K922 Gastrointestinal hemorrhage, unspecified: Secondary | ICD-10-CM | POA: Diagnosis not present

## 2018-03-21 DIAGNOSIS — R634 Abnormal weight loss: Secondary | ICD-10-CM | POA: Diagnosis not present

## 2018-03-21 DIAGNOSIS — I10 Essential (primary) hypertension: Secondary | ICD-10-CM | POA: Diagnosis not present

## 2018-03-21 DIAGNOSIS — Z6823 Body mass index (BMI) 23.0-23.9, adult: Secondary | ICD-10-CM | POA: Diagnosis not present

## 2018-04-19 DIAGNOSIS — Z23 Encounter for immunization: Secondary | ICD-10-CM | POA: Diagnosis not present

## 2018-06-12 DIAGNOSIS — Z139 Encounter for screening, unspecified: Secondary | ICD-10-CM | POA: Diagnosis not present

## 2018-06-12 DIAGNOSIS — Z0000 Encounter for general adult medical examination without abnormal findings: Secondary | ICD-10-CM | POA: Diagnosis not present

## 2018-06-12 DIAGNOSIS — Z125 Encounter for screening for malignant neoplasm of prostate: Secondary | ICD-10-CM | POA: Diagnosis not present

## 2018-08-01 DIAGNOSIS — N3000 Acute cystitis without hematuria: Secondary | ICD-10-CM | POA: Diagnosis not present

## 2018-08-01 DIAGNOSIS — C672 Malignant neoplasm of lateral wall of bladder: Secondary | ICD-10-CM | POA: Diagnosis not present

## 2018-08-01 DIAGNOSIS — N401 Enlarged prostate with lower urinary tract symptoms: Secondary | ICD-10-CM | POA: Diagnosis not present

## 2018-08-01 DIAGNOSIS — R351 Nocturia: Secondary | ICD-10-CM | POA: Diagnosis not present

## 2018-08-10 NOTE — Progress Notes (Signed)
Cardiology Office Note   Date:  08/11/2018   ID:  John [name], DOB 1945-07-16, MRN [id number]  PCP:  Cyndi Bender, PA-C  Cardiologist:   Minus Breeding, MD   Chief Complaint  Patient presents with  . Bradycardia      History of Present Illness: John [name] is a 73 y.o. male who presents for evaluation of HTN and LVH.  Since I last saw him he has done well.  The patient denies any new symptoms such as chest discomfort, neck or arm discomfort. There has been no new shortness of breath, PND or orthopnea. There have been no reported palpitations, presyncope or syncope.  He walks 3 miles per day.    Past Medical History:  Diagnosis Date  . 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 Frequency of urination   . History of prostate cancer urologist-  dr Alyson Ingles--  last PSA 0.8  in April 2017   dx 2013--  Stage T1c,  Gleason 3+3,  PSA 6.38,  vol 25.41cc--  s/p  radioactive seed implants 08-01-2012  . Hydrocele   . Hypertension   . Mobitz type 1 second degree atrioventricular block   . Nocturia   . Prostate cancer [facility])     Past Surgical History:  Procedure Laterality Date  . ABDOMINAL HERNIA REPAIR  1990's  . CYSTOSCOPY W/ RETROGRADES N/A 11/03/2015   Procedure: CYSTOSCOPY WITH RETROGRADE PYELOGRAM;  Surgeon: Cleon Gustin, MD;  Location: [facility];  Service: Urology;  Laterality: N/A;  . CYSTOSCOPY W/ RETROGRADES Left 01/09/2016   Procedure: CYSTOSCOPY WITH LEFT RETROGRADE PYELOGRAM;  Surgeon: Cleon Gustin, MD;  Location: [facility];  Service: Urology;  Laterality: Left;  . CYSTOSCOPY W/ RETROGRADES Bilateral 05/28/2016   Procedure: CYSTOSCOPY WITH RETROGRADE PYELOGRAM;  Surgeon: Cleon Gustin, MD;  Location: [facility];  Service: Urology;  Laterality:  Bilateral;  . CYSTOSCOPY W/ URETERAL STENT REMOVAL Left 01/09/2016   Procedure: CYSTOSCOPY WITH LEFT STENT EXCHANGE;  Surgeon: Cleon Gustin, MD;  Location: [facility];  Service: Urology;  Laterality: Left;  . CYSTOSCOPY WITH BIOPSY N/A 05/28/2016   Procedure: CYSTOSCOPY WITH BIOPSY;  Surgeon: Cleon Gustin, MD;  Location: [facility];  Service: Urology;  Laterality: N/A;  . CYSTOSCOPY WITH URETHRAL DILATATION N/A 05/28/2016   Procedure: CYSTOSCOPY WITH URETHRAL DILATATION;  Surgeon: Cleon Gustin, MD;  Location: [facility];  Service: Urology;  Laterality: N/A;  . PROSTATE BIOPSY  05/08/12   Adenocarcinoma  . RADIOACTIVE SEED IMPLANT  08/01/2012   Procedure: RADIOACTIVE SEED IMPLANT;  Surgeon: Hanley Ben, MD;  Location: [place];  Service: Urology;  Laterality: N/A;  73 seeds implanted no seeds found in bladder  . TRANSURETHRAL RESECTION OF BLADDER TUMOR N/A 11/03/2015   Procedure: TRANSURETHRAL RESECTION OF BLADDER TUMOR (TURBT)WITH INSERTION STENT;  Surgeon: Cleon Gustin, MD;  Location: [facility];  Service: Urology;  Laterality: N/A;  . TRANSURETHRAL RESECTION OF BLADDER TUMOR WITH GYRUS (TURBT-GYRUS) N/A 01/09/2016   Procedure: TRANSURETHRAL RESECTION OF BLADDER TUMOR WITH GYRUS (TURBT-GYRUS);  Surgeon: Cleon Gustin, MD;  Location: [facility];  Service: Urology;  Laterality: N/A;     Current Outpatient Medications  Medication Sig Dispense Refill  . amLODipine (NORVASC) 5 MG  tablet TAKE 1 TABLET BY MOUTH EVERY DAY FOR BLOOD PRESSURE. **REPLACES CHLORTHALIDONE**  1  . sulfamethoxazole-trimethoprim (BACTRIM DS,SEPTRA DS) 800-160 MG tablet Take 1 tablet by mouth 2 (two) times daily. For 7 days     No current facility-administered medications for this visit.     Allergies:   Patient has no known allergies.    ROS:  Please see the history of present illness.    Otherwise, review of systems are positive for none.   All other systems are reviewed and negative.    PHYSICAL EXAM: VS:  BP (!) 112/52   Pulse 66   Ht 6' (1.829 m)   Wt 173 lb 3.2 oz (78.6 kg)   BMI 23.49 kg/m  , BMI Body mass index is 23.49 kg/m. GENERAL:  Well appearing NECK:  No jugular venous distention, waveform within normal limits, carotid upstroke brisk and symmetric, no bruits, no thyromegaly LUNGS:  Clear to auscultation bilaterally CHEST:  Unremarkable HEART:  PMI not displaced or sustained,S1 and S2 within normal limits, no S3, no S4, no clicks, no rubs, soft brief apical non radiating murmur, no diastolic murmurs ABD:  Flat, positive bowel sounds normal in frequency in pitch, no bruits, no rebound, no guarding, no midline pulsatile mass, no hepatomegaly, no splenomegaly EXT:  2 plus pulses throughout, no edema, no cyanosis no clubbing    EKG:  EKG is ordered today. The ekg ordered today demonstrates sinus rhythm with Mobitz type I heart block, axis within normal limits, intervals within normal limits, no acute ST-T wave changes.   Recent Labs: No results found for requested labs within last 8760 hours.    Lipid Panel No results found for: CHOL, TRIG, HDL, CHOLHDL, VLDL, LDLCALC, LDLDIRECT    Wt Readings from Last 3 Encounters:  08/11/18 173 lb 3.2 oz (78.6 kg)  08/10/17 180 lb 6.4 oz (81.8 kg)  07/06/16 192 lb (87.1 kg)      Other studies Reviewed: Additional studies/ records that were reviewed today include: None. Review of the above records demonstrates:  Please see elsewhere in the note.     ASSESSMENT AND PLAN:  MOBITZ TYPE I:   The patient has no symptoms related to this.  No change in therapy is indicated.  HYPERTENSIVE HEART DISEASE: His blood pressures well controlled.  He was lightheaded on chlorthalidone but tolerates amlodipine.  No change in therapy.  Current medicines are reviewed at length with the patient today.  The patient does not  have concerns regarding medicines.  The following changes have been made:  no change  Labs/ tests ordered today include: None  Orders Placed This Encounter  Procedures  . EKG 12-Lead     Disposition:   FU with me as needed.      Signed, Minus Breeding, MD  08/11/2018 2:30 PM    [place]

## 2018-08-11 ENCOUNTER — Ambulatory Visit (INDEPENDENT_AMBULATORY_CARE_PROVIDER_SITE_OTHER): Payer: Medicare HMO | Admitting: Cardiology

## 2018-08-11 ENCOUNTER — Encounter (INDEPENDENT_AMBULATORY_CARE_PROVIDER_SITE_OTHER): Payer: Self-pay

## 2018-08-11 ENCOUNTER — Encounter: Payer: Self-pay | Admitting: Cardiology

## 2018-08-11 VITALS — BP 112/52 | HR 66 | Ht 72.0 in | Wt 173.2 lb

## 2018-08-11 DIAGNOSIS — I119 Hypertensive heart disease without heart failure: Secondary | ICD-10-CM | POA: Diagnosis not present

## 2018-08-11 DIAGNOSIS — I441 Atrioventricular block, second degree: Secondary | ICD-10-CM

## 2018-08-11 HISTORY — DX: Atrioventricular block, second degree: I44.1

## 2018-08-11 NOTE — Patient Instructions (Signed)
Medication Instructions:  Continue current medications  If you need a refill on your cardiac medications before your next appointment, please call your pharmacy.  Labwork: None Ordered   Take the provided lab slips with you to the lab for your blood draw.   When you have your labs (blood work) drawn today and your tests are completely normal, you will receive your results only by MyChart Message (if you have MyChart) -OR-  A paper copy in the mail.  If you have any lab test that is abnormal or we need to change your treatment, we will call you to review these results.  Testing/Procedures: None Ordered    Follow-Up: . Your physician recommends that you schedule a follow-up appointment in: As Needed  At [facility], you and your health needs are our priority.  As part of our continuing mission to provide you with exceptional heart care, we have created designated Provider Care Teams.  These Care Teams include your primary Cardiologist (physician) and Advanced Practice Providers (APPs -  Physician Assistants and Nurse Practitioners) who all work together to provide you with the care you need, when you need it.  Thank you for choosing [facility]!!

## 2018-09-19 DIAGNOSIS — I441 Atrioventricular block, second degree: Secondary | ICD-10-CM | POA: Diagnosis not present

## 2018-09-19 DIAGNOSIS — Z6824 Body mass index (BMI) 24.0-24.9, adult: Secondary | ICD-10-CM | POA: Diagnosis not present

## 2018-09-19 DIAGNOSIS — I10 Essential (primary) hypertension: Secondary | ICD-10-CM | POA: Diagnosis not present

## 2018-09-19 DIAGNOSIS — D649 Anemia, unspecified: Secondary | ICD-10-CM | POA: Diagnosis not present

## 2018-09-19 DIAGNOSIS — C679 Malignant neoplasm of bladder, unspecified: Secondary | ICD-10-CM | POA: Diagnosis not present

## 2019-01-21 IMAGING — CR DG CHEST 2V
2 series · 2 of 2 positions shown · non-contrast
Comparison: 06/30/2012

CLINICAL DATA: Fall.  Left rib pain.

EXAM:
CHEST  2 VIEW

[w chest pa]
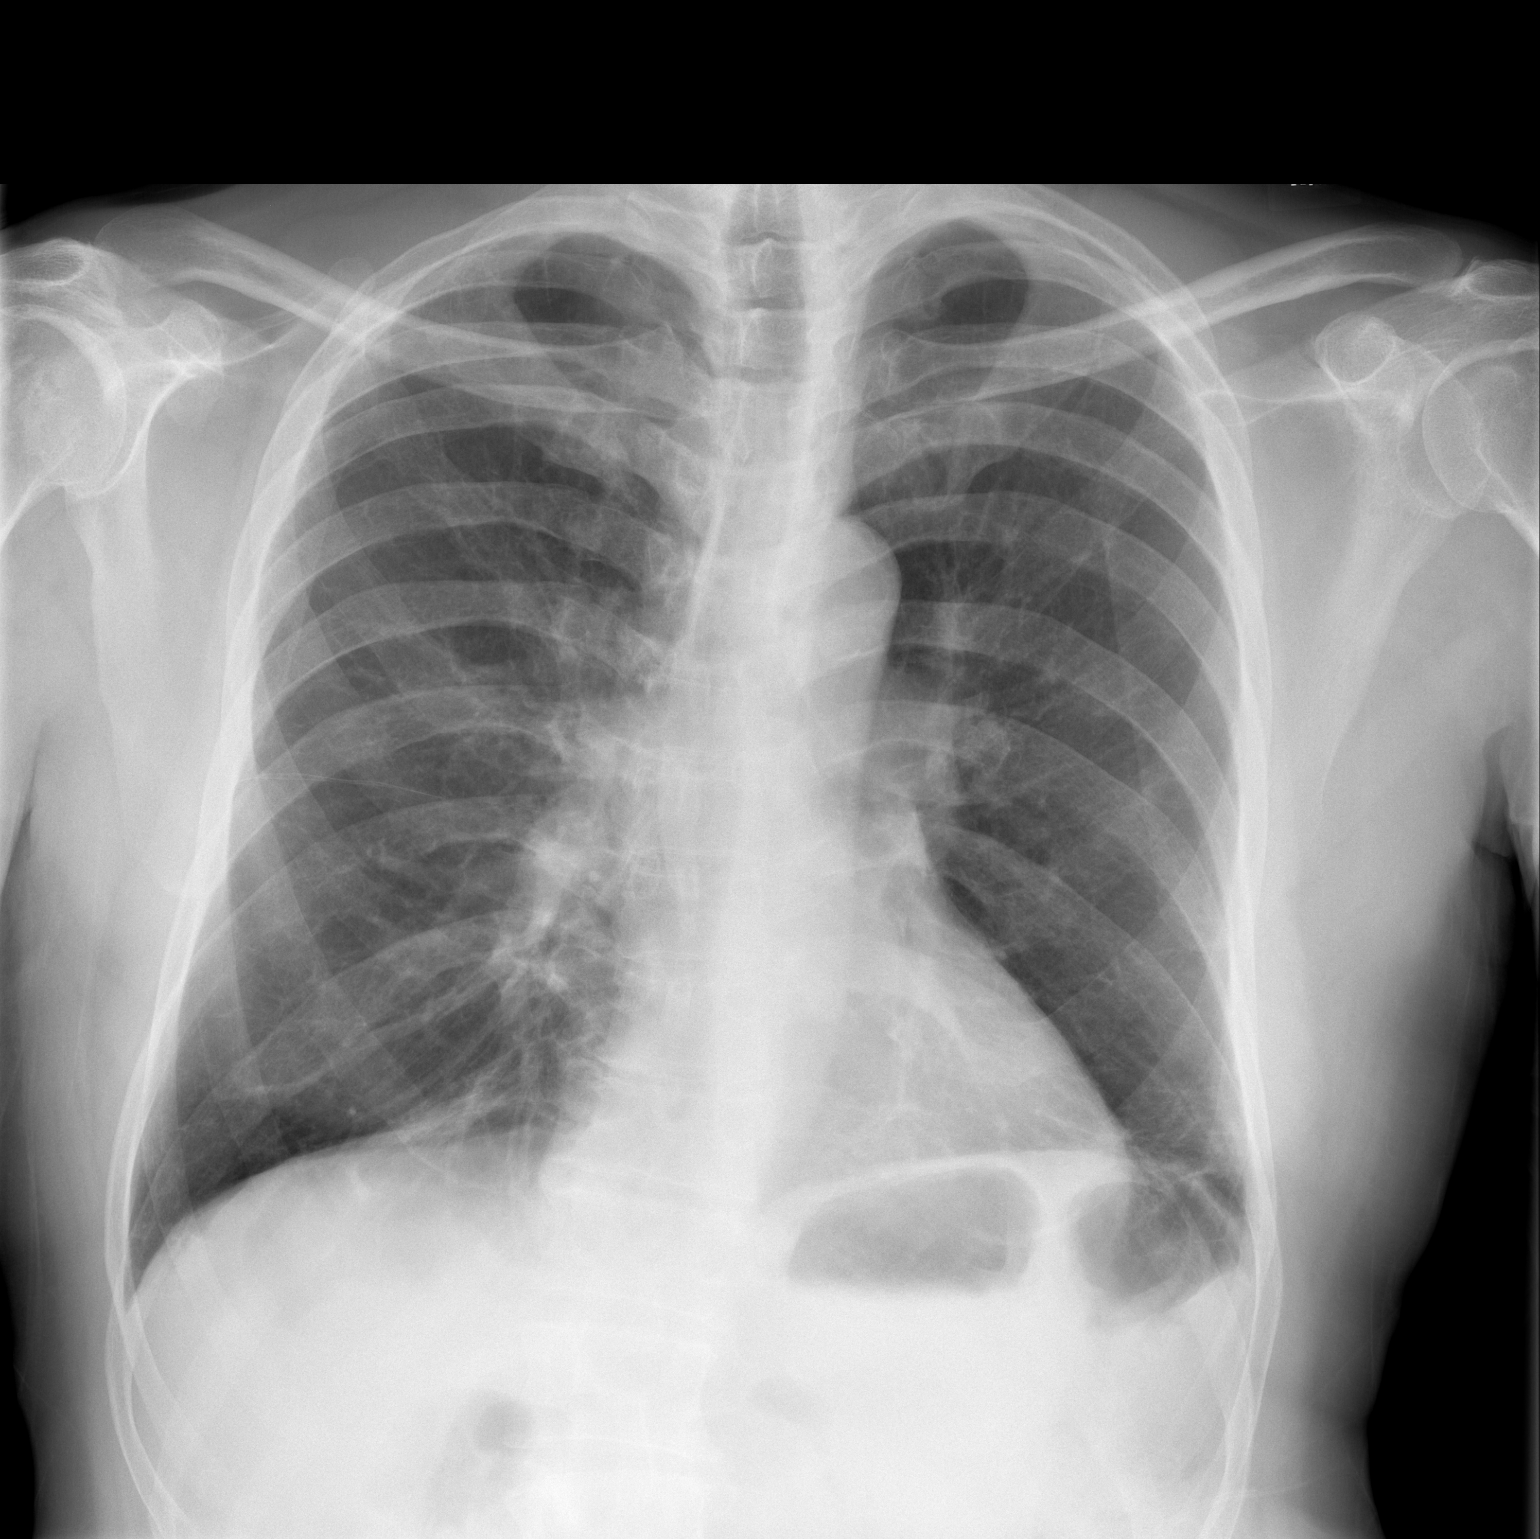

[w chest lat]
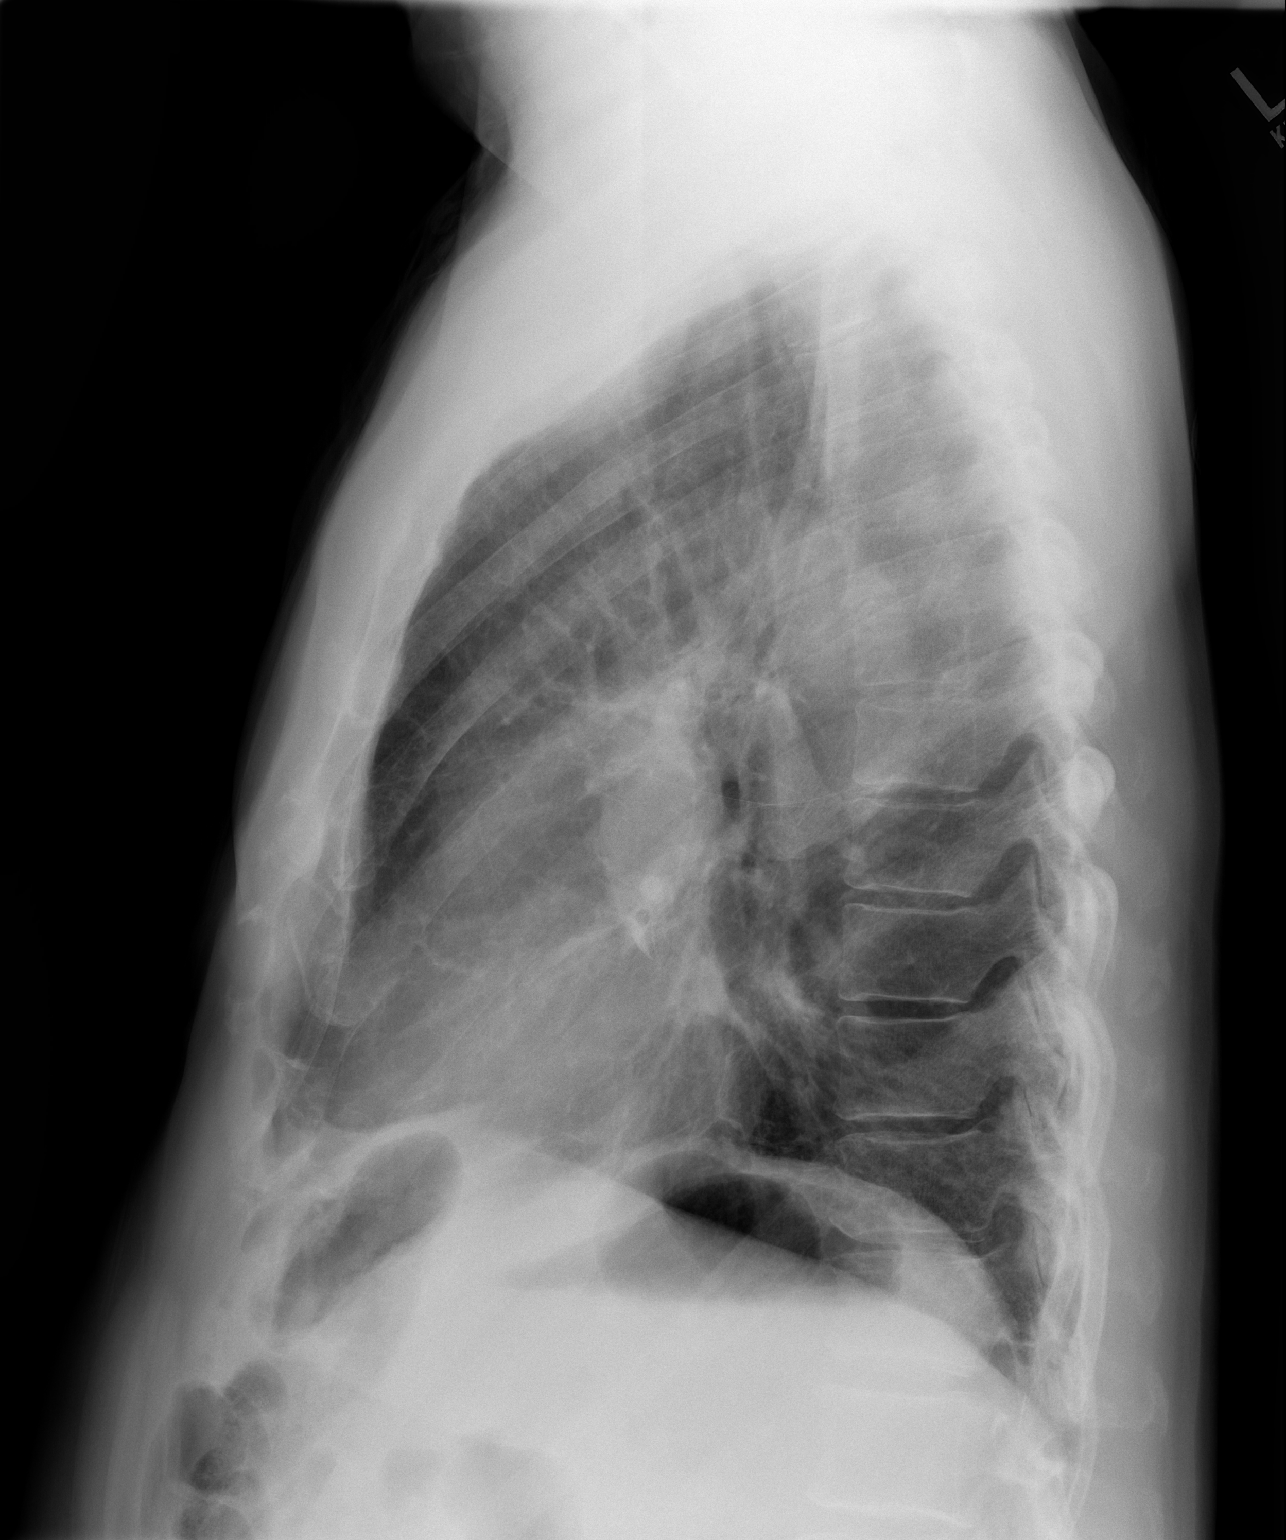

[2 of 2 positions shown; findings below may reference images not displayed]

FINDINGS: Mediastinum and hilar structures normal. Mild left base subsegmental
atelectasis. No pleural effusion or pneumothorax . No evidence of
displaced rib fracture or pneumothorax.
IMPRESSION: No acute cardiopulmonary disease. Bibasilar subsegmental atelectasis
and/or scarring.

## 2019-01-22 DIAGNOSIS — C61 Malignant neoplasm of prostate: Secondary | ICD-10-CM | POA: Diagnosis not present

## 2019-01-26 DIAGNOSIS — C672 Malignant neoplasm of lateral wall of bladder: Secondary | ICD-10-CM | POA: Diagnosis not present

## 2019-01-26 DIAGNOSIS — C61 Malignant neoplasm of prostate: Secondary | ICD-10-CM | POA: Diagnosis not present

## 2019-03-01 DIAGNOSIS — H2513 Age-related nuclear cataract, bilateral: Secondary | ICD-10-CM | POA: Diagnosis not present

## 2019-03-01 DIAGNOSIS — H40003 Preglaucoma, unspecified, bilateral: Secondary | ICD-10-CM | POA: Diagnosis not present

## 2019-03-22 DIAGNOSIS — C679 Malignant neoplasm of bladder, unspecified: Secondary | ICD-10-CM | POA: Diagnosis not present

## 2019-03-22 DIAGNOSIS — Z9181 History of falling: Secondary | ICD-10-CM | POA: Diagnosis not present

## 2019-03-22 DIAGNOSIS — Z6824 Body mass index (BMI) 24.0-24.9, adult: Secondary | ICD-10-CM | POA: Diagnosis not present

## 2019-03-22 DIAGNOSIS — Z23 Encounter for immunization: Secondary | ICD-10-CM | POA: Diagnosis not present

## 2019-03-22 DIAGNOSIS — I10 Essential (primary) hypertension: Secondary | ICD-10-CM | POA: Diagnosis not present

## 2019-03-22 DIAGNOSIS — D649 Anemia, unspecified: Secondary | ICD-10-CM | POA: Diagnosis not present

## 2019-03-22 DIAGNOSIS — I441 Atrioventricular block, second degree: Secondary | ICD-10-CM | POA: Diagnosis not present

## 2019-04-13 DIAGNOSIS — Z0100 Encounter for examination of eyes and vision without abnormal findings: Secondary | ICD-10-CM | POA: Diagnosis not present

## 2019-06-21 DIAGNOSIS — Z0000 Encounter for general adult medical examination without abnormal findings: Secondary | ICD-10-CM | POA: Diagnosis not present

## 2019-06-21 DIAGNOSIS — Z125 Encounter for screening for malignant neoplasm of prostate: Secondary | ICD-10-CM | POA: Diagnosis not present

## 2019-06-21 DIAGNOSIS — Z1331 Encounter for screening for depression: Secondary | ICD-10-CM | POA: Diagnosis not present

## 2019-06-21 DIAGNOSIS — Z9181 History of falling: Secondary | ICD-10-CM | POA: Diagnosis not present

## 2019-07-26 DIAGNOSIS — C672 Malignant neoplasm of lateral wall of bladder: Secondary | ICD-10-CM | POA: Diagnosis not present

## 2019-09-27 DIAGNOSIS — C679 Malignant neoplasm of bladder, unspecified: Secondary | ICD-10-CM | POA: Diagnosis not present

## 2019-09-27 DIAGNOSIS — D649 Anemia, unspecified: Secondary | ICD-10-CM | POA: Diagnosis not present

## 2019-09-27 DIAGNOSIS — H6123 Impacted cerumen, bilateral: Secondary | ICD-10-CM | POA: Diagnosis not present

## 2019-09-27 DIAGNOSIS — Z6824 Body mass index (BMI) 24.0-24.9, adult: Secondary | ICD-10-CM | POA: Diagnosis not present

## 2019-09-27 DIAGNOSIS — I441 Atrioventricular block, second degree: Secondary | ICD-10-CM | POA: Diagnosis not present

## 2019-09-27 DIAGNOSIS — I10 Essential (primary) hypertension: Secondary | ICD-10-CM | POA: Diagnosis not present

## 2020-01-30 DIAGNOSIS — C61 Malignant neoplasm of prostate: Secondary | ICD-10-CM | POA: Diagnosis not present

## 2020-02-07 DIAGNOSIS — Z8551 Personal history of malignant neoplasm of bladder: Secondary | ICD-10-CM | POA: Diagnosis not present

## 2020-06-23 DIAGNOSIS — Z1331 Encounter for screening for depression: Secondary | ICD-10-CM | POA: Diagnosis not present

## 2020-06-23 DIAGNOSIS — Z0000 Encounter for general adult medical examination without abnormal findings: Secondary | ICD-10-CM | POA: Diagnosis not present

## 2020-06-23 DIAGNOSIS — Z9181 History of falling: Secondary | ICD-10-CM | POA: Diagnosis not present

## 2021-02-10 ENCOUNTER — Other Ambulatory Visit: Payer: Self-pay | Admitting: Urology

## 2021-02-11 ENCOUNTER — Encounter (HOSPITAL_BASED_OUTPATIENT_CLINIC_OR_DEPARTMENT_OTHER): Payer: Self-pay | Admitting: Urology

## 2021-02-11 ENCOUNTER — Other Ambulatory Visit: Payer: Self-pay

## 2021-02-11 DIAGNOSIS — R350 Frequency of micturition: Secondary | ICD-10-CM

## 2021-02-11 DIAGNOSIS — I10 Essential (primary) hypertension: Secondary | ICD-10-CM

## 2021-02-11 DIAGNOSIS — R351 Nocturia: Secondary | ICD-10-CM

## 2021-02-11 DIAGNOSIS — Z973 Presence of spectacles and contact lenses: Secondary | ICD-10-CM

## 2021-02-11 DIAGNOSIS — M1990 Unspecified osteoarthritis, unspecified site: Secondary | ICD-10-CM

## 2021-02-11 DIAGNOSIS — L409 Psoriasis, unspecified: Secondary | ICD-10-CM

## 2021-02-11 HISTORY — DX: Nocturia: R35.1

## 2021-02-11 HISTORY — DX: Psoriasis, unspecified: L40.9

## 2021-02-11 HISTORY — DX: Frequency of micturition: R35.0

## 2021-02-11 HISTORY — DX: Presence of spectacles and contact lenses: Z97.3

## 2021-02-11 HISTORY — DX: Essential (primary) hypertension: I10

## 2021-02-11 HISTORY — DX: Unspecified osteoarthritis, unspecified site: M19.90

## 2021-02-11 NOTE — Progress Notes (Addendum)
Spoke w/ via phone for pre-op interview---pt Lab needs dos----  I stat, ekg             Lab results------lov dr hochrein cardiology (mobiltx type 1 )08-11-2018 epic f/u prnecho 07-23-2016 epic, holter monitor 07-08-2016 epic COVID test -----patient states asymptomatic no test needed Arrive at -------915 am 02-17-2021 NPO after MN NO Solid Food.  Clear liquids from MN until---815 am then npo Med rec completed Medications to take morning of surgery -----amlodipine Diabetic medication -----n/a Patient instructed no nail polish to be worn day of surgery Patient instructed to bring photo id and insurance card day of surgery Patient aware to have Driver (ride ) / caregiver  sister John [name] cell [phone]   for 24 hours after surgery  Patient Special Instructions -----orders need 2nd sign Pre-Op special Istructions -----none Patient verbalized understanding of instructions that were given at this phone interview. Patient denies shortness of breath, chest pain, fever, cough  or sob  or cardiac S & S at this phone interview.

## 2021-02-16 NOTE — H&P (Signed)
John [name] is a 75 year-old male established patient who is here for bladder cancer.   His problem was diagnosed 11/05/2015. His bladder cancer was diagnosed by [facility]. His cancer was diagnosed at [facility]. The bladder cancer was found because of blood in his urine.   His bladder cancer was treated by removal with scope. Patient denies removal of the entire bladder, radiation, and chemotherapy.   His last cysto was approximately 01/12/2016.   He does have a good appetite. BOWEL HABITS: his bowels are moving normally. He is not having pain in new locations. He has not recently had unwanted weight loss.   His last U/S or CT Scan was approximately 09/10/2015.   02/02/2016: T1G3 in 10/2015 and repeat resection was negative.   08/19/2016: no hematuria, no dysuria   11/23/2016: no hematuria, no dysuria. no worsening LUTS.   02/25/2017: no dysuria, no hematuria. 1 year since last TURBT   06/06/2017: NO hematuria or dysuria. NO new LUTS.   10/27/2017: NO new LUTS, no hematuria/dysuria.   01/27/2018: NO worsening LUTS. NO hematuria or dysuria   08/01/2018: No hematuria or dysuria.   01/26/2019: No new LUTS. no hematuria or dysuria   07/26/2019: no hematuria or dysuria. 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 Repeat resection was negative according in June of 2017. The patient did not undergo BCG therapy. The patient has had negative surveillance cysto since that time last 1 being in January of 2021. No interim GU issues. Here for follow-up surveillance cysto in cytology.  Cysto performed today shows normal pendulous prostatic urethra. Bladder was without recurrent tumors.  -02/06/21-patient with history of superficial transitional cell carcinoma bladder last resected April 2017 with re-resection in June of 2017 showing no evidence of remaining tumor. No interim GU issues  here for annual follow-up surveillance cysto.  Micro urinalysis is clear on urine spun sediment  Cysto is performed today and shows: Recurrent 1-2 cm papillary exophytic tumor on the left posterolateral bladder wall. Remainder bladder. Grossly normal.     ALLERGIES: No Allergies    MEDICATIONS: Amlodipine Besilate     GU PSH: Bladder Instill AntiCA Agent - 2017 Cysto Bladder Ureth Biopsy - 2017 Cysto Remove Stent FB Sim - 2017 Cystoscopy - 02/07/2020, 2021, 2020, 2020, 2019, 2019, 2018, 2018, 2018, 2017 Cystoscopy Insert Stent, Left - 2017, 2017 Cystoscopy TURBT >5 cm - 2017 Cystoscopy TURBT 2-5 cm - 2017 TRANSPERI NEEDLE PLACE, PROS - 2014       [facility] Notes: Bladder Injection Of Cancer Treatment, Cystoscopy With Fulguration Large Lesion (Over 5cm), Cystoscopy With Insertion Of Ureteral Stent Left, Surgery Prostate Transperineal Placement Of Needles, Hernia Repair   NON-GU PSH: Hernia Repair - 2013     GU PMH: History of bladder cancer - 02/07/2020 Bladder Cancer Lateral - 2021, - 2020, - 2020, - 2019, - 2019, - 2018, - 2018, - 2018, - 2018, - 2017, - 2017, - 2017, Malignant neoplasm of lateral wall of urinary bladder, - 2017 Prostate Cancer - 2020, - 2019, - 2018, - 2018, Adenocarcinoma of prostate, - 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 - 2020, - 2019 Dysuria, Dysuria - 2017 Gross hematuria, Gross hematuria - 2017 Other Disorders Of Bladder, Bladder mass - 2017 Other microscopic hematuria, Microscopic hematuria - 2016 Elevated PSA, Elevated prostate specific antigen (PSA) - 2014      PMH Notes:  2012-03-27 10:49:20 - Note: No  Medical Problems   NON-GU PMH: Encounter for general adult medical examination without abnormal findings, Encounter for preventive health examination - 2017    FAMILY HISTORY: Congestive Heart Failure - Mother Family Health Status Number - No Family History Father Deceased At Age30 ___ - Runs In Family Mother Deceased At Age 76 from diabetic complicati - Runs In  Family nephrolithiasis - Mother renal failure - Mother   SOCIAL HISTORY: Marital Status: Single Preferred Language: English; Ethnicity: Not Hispanic Or Latino; Race: Black or African American Current Smoking Status: Patient does not smoke anymore. Smoked for 40 years.  Drinks 3 caffeinated drinks per day. Patient's occupation is/was Retired.     Notes: Former smoker, Alcohol Use, Marital History - Single, Occupation: Retired, Caffeine Use   REVIEW OF SYSTEMS:    GU Review Male:   Patient denies frequent urination, hard to postpone urination, burning/ pain with urination, get up at night to urinate, leakage of urine, stream starts and stops, trouble starting your stream, have to strain to urinate , erection problems, and penile pain.  Gastrointestinal (Upper):   Patient denies nausea, vomiting, and indigestion/ heartburn.  Gastrointestinal (Lower):   Patient denies diarrhea and constipation.  Constitutional:   Patient denies weight loss, fatigue, night sweats, and fever.  Skin:   Patient denies skin rash/ lesion and itching.  Eyes:   Patient denies blurred vision and double vision.  Ears/ Nose/ Throat:   Patient denies sore throat and sinus problems.  Hematologic/Lymphatic:   Patient denies swollen glands and easy bruising.  Cardiovascular:   Patient denies leg swelling and chest pains.  Respiratory:   Patient denies cough and shortness of breath.  Endocrine:   Patient denies excessive thirst.  Musculoskeletal:   Patient denies back pain and joint pain.  Neurological:   Patient denies headaches and dizziness.  Psychologic:   Patient denies depression and anxiety.   VITAL SIGNS: None   GU PHYSICAL EXAMINATION:    Epididymides: Right: no spermatocele, no masses, no cysts, no tenderness, no induration, no enlargement. Left: no spermatocele, no masses, no cysts, no tenderness, no induration, no enlargement.  Testes: No tenderness, no swelling, no enlargement left testes. No tenderness, no  swelling, no enlargement right testes. Normal location left testes. Normal location right testes. No mass, no cyst, no varicocele, no hydrocele left testes. No mass, no cyst, no varicocele, no hydrocele right testes.  Urethral Meatus: Normal size. No lesion, no wart, no discharge, no polyp. Normal location.  Penis: Circumcised, no warts, no cracks. No dorsal Peyronie's plaques, no left corporal Peyronie's plaques, no right corporal Peyronie's plaques, no scarring, no warts. No balanitis, no meatal stenosis.   MULTI-SYSTEM PHYSICAL EXAMINATION:       Complexity of Data:   01/30/20 01/29/19 01/23/18 05/26/17 11/23/16 02/27/16 08/29/15 01/14/15  PSA  Total PSA 0.037 ng/mL 0.046 ng/mL 0.055 ng/mL 0.10 ng/mL 0.075 ng/dl 0.07 ng/dl 0.09  0.08     PROCEDURES:         Flexible Cystoscopy - 52000  Risks, benefits, and some of the potential complications of the procedure were discussed at length with the patient including infection, bleeding, voiding discomfort, urinary retention, fever, chills, sepsis, and others. All questions were answered. Informed consent was obtained. Antibiotic prophylaxis was given. Sterile technique and intraurethral analgesia were used.  Meatus:  Normal size. Normal location. Normal condition.  Urethra:  No strictures.  External Sphincter:  Normal.  Verumontanum:  Normal.  Prostate:  Non-obstructing. No hyperplasia.  Bladder Neck:  Non-obstructing.  Ureteral Orifices:  Normal location. Normal size. Normal shape. Effluxed clear urine.  Bladder:  Cysto is performed today and shows: Recurrent 1-2 cm papillary exophytic tumor on the left posterolateral bladder wall. Remainder bladder. Grossly normal.      The lower urinary tract was carefully examined. The procedure was well-tolerated and without complications. Antibiotic instructions were given. Instructions were given to call the office immediately for bloody urine, difficulty urinating, urinary retention, painful or frequent  urination, fever, chills, nausea, vomiting or other illness. The patient stated that he understood these instructions and would comply with them.         Urinalysis - 81003 Dipstick Dipstick Cont'd  Color: Yellow Bilirubin: Neg mg/dL  Appearance: Clear Ketones: Neg mg/dL  Specific Gravity: 1.015 Blood: Neg ery/uL  pH: <=5.0 Protein: Trace mg/dL  Glucose: Neg mg/dL Urobilinogen: 0.2 mg/dL    Nitrites: Neg    Leukocyte Esterase: Neg leu/uL    ASSESSMENT:      ICD-10 Details  1 GU:   Bladder Cancer Lateral - [id number] Acute, Complicated Injury   PLAN:           Document Letter(s):  Created for Patient: Clinical Summary         Notes:   Cystoscopic findings discussed with the patient. Recommended cysto bilateral retrogrades TURBT. This will be scheduled in the near future. Risks and benefits discussed as outlined below.  TURBT consent: I have discussed with the patient the risks, benefits of TURBT which include but are not limited to: Bleeding, infection, damage to the bladder with potential perforation of the bladder, damage to surrounding organs, possible need for further procedures including open repair and catheterization, possibility of nonhealing area within the bladder, urgency, frequency which may be refractory to medications. I pointed out that in some occasions after resection of the bladder tumor, mitomycin-C chemotherapy may be instilled into the bladder. The risks associated with this therapy include but are not limited to: Refractory or new onset urgency, frequency, dysuria, infrequently severe systemic side effects secondary to mitomycin-C. After full discussion of the risks, benefits and alternatives, the patient has consented to the above procedure and desires to proceed.

## 2021-02-17 ENCOUNTER — Other Ambulatory Visit: Payer: Self-pay

## 2021-02-17 ENCOUNTER — Encounter (HOSPITAL_BASED_OUTPATIENT_CLINIC_OR_DEPARTMENT_OTHER): Payer: Self-pay | Admitting: Urology

## 2021-02-17 ENCOUNTER — Ambulatory Visit (HOSPITAL_BASED_OUTPATIENT_CLINIC_OR_DEPARTMENT_OTHER): Payer: Medicare HMO | Admitting: Anesthesiology

## 2021-02-17 ENCOUNTER — Ambulatory Visit (HOSPITAL_BASED_OUTPATIENT_CLINIC_OR_DEPARTMENT_OTHER)
Admission: RE | Admit: 2021-02-17 | Discharge: 2021-02-17 | Disposition: A | Discharge status: Home / Self Care | Payer: Medicare HMO | Attending: Urology | Admitting: Urology

## 2021-02-17 ENCOUNTER — Encounter (HOSPITAL_BASED_OUTPATIENT_CLINIC_OR_DEPARTMENT_OTHER)
Admission: RE | Disposition: A | Discharge status: Home / Self Care | Payer: Self-pay | Source: Home / Self Care | Attending: Urology

## 2021-02-17 DIAGNOSIS — C672 Malignant neoplasm of lateral wall of bladder: Secondary | ICD-10-CM | POA: Insufficient documentation

## 2021-02-17 DIAGNOSIS — N433 Hydrocele, unspecified: Secondary | ICD-10-CM | POA: Diagnosis not present

## 2021-02-17 DIAGNOSIS — I10 Essential (primary) hypertension: Secondary | ICD-10-CM | POA: Diagnosis not present

## 2021-02-17 DIAGNOSIS — M1990 Unspecified osteoarthritis, unspecified site: Secondary | ICD-10-CM | POA: Diagnosis not present

## 2021-02-17 DIAGNOSIS — Z87891 Personal history of nicotine dependence: Secondary | ICD-10-CM | POA: Diagnosis not present

## 2021-02-17 DIAGNOSIS — C679 Malignant neoplasm of bladder, unspecified: Secondary | ICD-10-CM | POA: Diagnosis not present

## 2021-02-17 DIAGNOSIS — D090 Carcinoma in situ of bladder: Secondary | ICD-10-CM | POA: Diagnosis not present

## 2021-02-17 HISTORY — PX: TRANSURETHRAL RESECTION OF BLADDER TUMOR: SHX2575

## 2021-02-17 HISTORY — PX: CYSTOSCOPY W/ RETROGRADES: SHX1426

## 2021-02-17 LAB — POCT I-STAT, CHEM 8
BUN: 10 mg/dL (ref 8–23)
Calcium, Ion: 1.3 mmol/L (ref 1.15–1.40)
Chloride: 108 mmol/L (ref 98–111)
Creatinine, Ser: 1 mg/dL (ref 0.61–1.24)
Glucose, Bld: 96 mg/dL (ref 70–99)
HCT: 44 % (ref 39.0–52.0)
Hemoglobin: 15 g/dL (ref 13.0–17.0)
Potassium: 3.6 mmol/L (ref 3.5–5.1)
Sodium: 145 mmol/L (ref 135–145)
TCO2: 26 mmol/L (ref 22–32)

## 2021-02-17 SURGERY — TURBT (TRANSURETHRAL RESECTION OF BLADDER TUMOR)
Anesthesia: General | Site: Pelvis

## 2021-02-17 MED ORDER — WHITE PETROLATUM EX OINT
TOPICAL_OINTMENT | CUTANEOUS | Status: AC
  Filled 2021-02-17: qty 5

## 2021-02-17 MED ORDER — FENTANYL CITRATE (PF) 100 MCG/2ML IJ SOLN
INTRAMUSCULAR | Status: AC
  Filled 2021-02-17: qty 2

## 2021-02-17 MED ORDER — SUCCINYLCHOLINE CHLORIDE 200 MG/10ML IV SOSY
PREFILLED_SYRINGE | INTRAVENOUS | Status: AC
  Filled 2021-02-17: qty 10

## 2021-02-17 MED ORDER — DEXAMETHASONE SODIUM PHOSPHATE 10 MG/ML IJ SOLN
INTRAMUSCULAR | Status: AC
  Filled 2021-02-17: qty 1

## 2021-02-17 MED ORDER — FENTANYL CITRATE (PF) 100 MCG/2ML IJ SOLN: 25.0000 ug | INTRAMUSCULAR | Status: DC | PRN

## 2021-02-17 MED ORDER — CEFAZOLIN SODIUM-DEXTROSE 2-4 GM/100ML-% IV SOLN
INTRAVENOUS | Status: AC
  Filled 2021-02-17: qty 100

## 2021-02-17 MED ORDER — CEFAZOLIN SODIUM-DEXTROSE 2-4 GM/100ML-% IV SOLN
2.0000 g | INTRAVENOUS | Status: AC
  Administered 2021-02-17: 2 g via INTRAVENOUS

## 2021-02-17 MED ORDER — IOHEXOL 300 MG/ML  SOLN
INTRAMUSCULAR | Status: DC | PRN
  Administered 2021-02-17: 10 mL via URETHRAL

## 2021-02-17 MED ORDER — PROPOFOL 10 MG/ML IV BOLUS
INTRAVENOUS | Status: AC
  Filled 2021-02-17: qty 20

## 2021-02-17 MED ORDER — ROCURONIUM BROMIDE 10 MG/ML (PF) SYRINGE
PREFILLED_SYRINGE | INTRAVENOUS | Status: AC
  Filled 2021-02-17: qty 10

## 2021-02-17 MED ORDER — FENTANYL CITRATE (PF) 100 MCG/2ML IJ SOLN
INTRAMUSCULAR | Status: DC | PRN
  Administered 2021-02-17 (×2): 50 ug via INTRAVENOUS

## 2021-02-17 MED ORDER — LIDOCAINE 2% (20 MG/ML) 5 ML SYRINGE
INTRAMUSCULAR | Status: DC | PRN
  Administered 2021-02-17: 100 mg via INTRAVENOUS

## 2021-02-17 MED ORDER — PROPOFOL 10 MG/ML IV BOLUS
INTRAVENOUS | Status: DC | PRN
  Administered 2021-02-17: 20 mg via INTRAVENOUS
  Administered 2021-02-17: 160 mg via INTRAVENOUS

## 2021-02-17 MED ORDER — ONDANSETRON HCL 4 MG/2ML IJ SOLN
INTRAMUSCULAR | Status: DC | PRN
  Administered 2021-02-17: 4 mg via INTRAVENOUS

## 2021-02-17 MED ORDER — ROCURONIUM BROMIDE 10 MG/ML (PF) SYRINGE
PREFILLED_SYRINGE | INTRAVENOUS | Status: DC | PRN
  Administered 2021-02-17: 5 mg via INTRAVENOUS

## 2021-02-17 MED ORDER — SUCCINYLCHOLINE CHLORIDE 200 MG/10ML IV SOSY
PREFILLED_SYRINGE | INTRAVENOUS | Status: DC | PRN
  Administered 2021-02-17: 120 mg via INTRAVENOUS

## 2021-02-17 MED ORDER — TRAMADOL HCL 50 MG PO TABS: 50.0000 mg | ORAL_TABLET | Freq: Four times a day (QID) | ORAL | 0 refills | Status: AC | PRN

## 2021-02-17 MED ORDER — LACTATED RINGERS IV SOLN: INTRAVENOUS | Status: DC

## 2021-02-17 MED ORDER — 0.9 % SODIUM CHLORIDE (POUR BTL) OPTIME
TOPICAL | Status: DC | PRN
  Administered 2021-02-17: 500 mL

## 2021-02-17 MED ORDER — ONDANSETRON HCL 4 MG/2ML IJ SOLN
INTRAMUSCULAR | Status: AC
  Filled 2021-02-17: qty 2

## 2021-02-17 MED ORDER — LIDOCAINE HCL (PF) 2 % IJ SOLN
INTRAMUSCULAR | Status: AC
  Filled 2021-02-17: qty 5

## 2021-02-17 MED ORDER — DEXAMETHASONE SODIUM PHOSPHATE 10 MG/ML IJ SOLN
INTRAMUSCULAR | Status: DC | PRN
  Administered 2021-02-17: 5 mg via INTRAVENOUS

## 2021-02-17 MED ORDER — STERILE WATER FOR IRRIGATION IR SOLN
Status: DC | PRN
  Administered 2021-02-17: 1000 mL

## 2021-02-17 SURGICAL SUPPLY — 35 items
BAG DRAIN URO-CYSTO SKYTR STRL (DRAIN) ×3 IMPLANT
BAG DRN RND TRDRP ANRFLXCHMBR (UROLOGICAL SUPPLIES)
BAG DRN UROCATH (DRAIN) ×2
BAG URINE DRAIN 2000ML AR STRL (UROLOGICAL SUPPLIES) IMPLANT
BAG URINE LEG 500ML (DRAIN) IMPLANT
BULB IRRIG PATHFIND (MISCELLANEOUS) IMPLANT
CATH FOLEY 2WAY SLVR  5CC 20FR (CATHETERS)
CATH FOLEY 2WAY SLVR  5CC 22FR (CATHETERS)
CATH FOLEY 2WAY SLVR 5CC 20FR (CATHETERS) IMPLANT
CATH FOLEY 2WAY SLVR 5CC 22FR (CATHETERS) IMPLANT
CATH URET 5FR 28IN CONE TIP (BALLOONS) ×1
CATH URET 5FR 28IN OPEN ENDED (CATHETERS) IMPLANT
CATH URET 5FR 70CM CONE TIP (BALLOONS) ×2 IMPLANT
CLOTH BEACON ORANGE TIMEOUT ST (SAFETY) ×3 IMPLANT
ELECT REM PT RETURN 9FT ADLT (ELECTROSURGICAL) ×3
ELECTRODE REM PT RTRN 9FT ADLT (ELECTROSURGICAL) ×2 IMPLANT
EVACUATOR MICROVAS BLADDER (UROLOGICAL SUPPLIES) IMPLANT
FIBER LASER FLEXIVA 365 (UROLOGICAL SUPPLIES) IMPLANT
GLOVE SURG ENC MOIS LTX SZ7.5 (GLOVE) ×3 IMPLANT
GOWN STRL REUS W/TWL LRG LVL3 (GOWN DISPOSABLE) ×3 IMPLANT
GUIDEWIRE ANG ZIPWIRE 038X150 (WIRE) IMPLANT
GUIDEWIRE STR DUAL SENSOR (WIRE) IMPLANT
HOLDER FOLEY CATH W/STRAP (MISCELLANEOUS) IMPLANT
IV NS IRRIG 3000ML ARTHROMATIC (IV SOLUTION) ×3 IMPLANT
KIT TURNOVER CYSTO (KITS) ×3 IMPLANT
LOOP MONOPOLAR YLW (ELECTROSURGICAL) IMPLANT
MANIFOLD NEPTUNE II (INSTRUMENTS) IMPLANT
PACK CYSTO (CUSTOM PROCEDURE TRAY) ×3 IMPLANT
PLUG CATH AND CAP STER (CATHETERS) IMPLANT
SYR 20ML LL LF (SYRINGE) ×3 IMPLANT
SYR TOOMEY IRRIG 70ML (MISCELLANEOUS)
SYRINGE TOOMEY IRRIG 70ML (MISCELLANEOUS) IMPLANT
TRACTIP FLEXIVA PULS ID 200XHI (Laser) IMPLANT
TRACTIP FLEXIVA PULSE ID 200 (Laser)
TUBE CONNECTING 12X1/4 (SUCTIONS) IMPLANT

## 2021-02-17 NOTE — Anesthesia Postprocedure Evaluation (Signed)
Anesthesia Post Note  Patient: John [name]  Procedure(s) Performed: TRANSURETHRAL RESECTION OF BLADDER TUMOR (TURBT) (Bladder) CYSTOSCOPY WITH RETROGRADE PYELOGRAM (Bilateral: Pelvis)     Patient location during evaluation: PACU Anesthesia Type: General Level of consciousness: awake Pain management: pain level controlled Vital Signs Assessment: post-procedure vital signs reviewed and stable Respiratory status: spontaneous breathing Cardiovascular status: stable Postop Assessment: no apparent nausea or vomiting Anesthetic complications: no   No notable events documented.  Last Vitals:  Vitals:   02/17/21 1151 02/17/21 1230  BP:  (!) 148/51  Pulse: 64 61  Resp: 20 16  Temp:  36.6 C  SpO2: 96% 96%    Last Pain:  Vitals:   02/17/21 1230  TempSrc:   PainSc: 0-No pain                 Smith Mcnicholas

## 2021-02-17 NOTE — Discharge Instructions (Addendum)

## 2021-02-17 NOTE — Interval H&P Note (Signed)
History and Physical Interval Note:  02/17/2021 10:16 AM  John [name]  has presented today for surgery, with the diagnosis of BLADDER CANCER.  The various methods of treatment have been discussed with the patient and family. After consideration of risks, benefits and other options for treatment, the patient has consented to  Procedure(s) with comments: TRANSURETHRAL RESECTION OF BLADDER TUMOR (TURBT) (N/A) - 30 MINS CYSTOSCOPY WITH RETROGRADE PYELOGRAM (Bilateral) as a surgical intervention.  The patient's history has been reviewed, patient examined, no change in status, stable for surgery.  I have reviewed the patient's chart and labs.  Questions were answered to the patient's satisfaction.     Remi Haggard

## 2021-02-17 NOTE — Anesthesia Procedure Notes (Signed)
Procedure Name: Intubation Date/Time: 02/17/2021 10:42 AM Performed by: Mechele Claude, CRNA Pre-anesthesia Checklist: Patient identified, Emergency Drugs available, Suction available and Patient being monitored Patient Re-evaluated:Patient Re-evaluated prior to induction Oxygen Delivery Method: Circle system utilized Preoxygenation: Pre-oxygenation with 100% oxygen Induction Type: IV induction Ventilation: Mask ventilation without difficulty Laryngoscope Size: Mac and 4 Grade View: Grade II Tube type: Oral Tube size: 7.5 mm Number of attempts: 1 Airway Equipment and Method: Stylet and Oral airway Placement Confirmation: ETT inserted through vocal cords under direct vision, positive ETCO2 and breath sounds checked- equal and bilateral Secured at: 22 cm Tube secured with: Tape Dental Injury: Teeth and Oropharynx as per pre-operative assessment

## 2021-02-17 NOTE — Transfer of Care (Signed)
Immediate Anesthesia Transfer of Care Note  Patient: John [name]  Procedure(s) Performed: Procedure(s) (LRB): TRANSURETHRAL RESECTION OF BLADDER TUMOR (TURBT) (N/A) CYSTOSCOPY WITH RETROGRADE PYELOGRAM (Bilateral)  Patient Location: PACU  Anesthesia Type: General  Level of Consciousness: awake, alert  and oriented  Airway & Oxygen Therapy: Patient Spontanous Breathing and Patient connected to face mask oxygen  Post-op Assessment: Report given to PACU RN and Post -op Vital signs reviewed and stable  Post vital signs: Reviewed and stable  Complications: No apparent anesthesia complications  Last Vitals:  Vitals Value Taken Time  BP 157/62 02/17/21 1116  Temp 36.7 C 02/17/21 1121  Pulse 67 02/17/21 1122  Resp 19 02/17/21 1122  SpO2 100 % 02/17/21 1122  Vitals shown include unvalidated device data.  Last Pain:  Vitals:   02/17/21 1121  TempSrc: Oral  PainSc:          Complications: No notable events documented.

## 2021-02-17 NOTE — Op Note (Signed)
Preoperative diagnosis:  1.  Recurrent bladder cancer  Postoperative diagnosis: 1.  Same  Procedure(s): 1.  Cystoscopy, bilateral retrograde pyelogram with intraoperative interpretation, transurethral section of bladder tumor (medium)  Surgeon: Dr. Harold Barban  Anesthesia: General  Complications: None  EBL: Minimal  Specimens: Bladder tumor  Disposition of specimens: To pathology  Intraoperative findings: 2 cm exophytic papillary tumor left posterior lateral bladder wall, completely removed utilizing rigid biopsy forceps.  Base fulgurated.  Bilateral retrogrades were normal  Indication: 75 year old African-American male with history of superficial transitional cell carcinoma the bladder and was found to have recurrent tumor on recent surveillance cystoscopy here for cystoscopy and TURBT and retrogrades.  Description of procedure:  After obtaining informed consent for the patient he was taken to the major cystoscopy suite placed under general anesthesia.  Placed in the dorsolithotomy position genitalia prepped and draped in usual sterile fashion.  Proper pause and timeout was performed.  55 [place] the scope was advanced into the bladder without difficulty.  Bladder was inspected with above-noted findings.  No other recurrent lesions noted except on the left posterior aspect of the bladder.  Ureteral orifice ease were in normal position left ureteral orifice was somewhat patulous from prior TURBT.  Right ureteral orifice was normal in caliber and both were effluxing clear urine.  Bilateral retrograde pyelograms were performed with a 6 French cone-tip catheter.  Both upper tracts filled normally without evidence of filling defect or hydronephrosis.  Both upper tract emptied out promptly upon removal of the retrograde catheter.  Attention was then directed to removal of the tumor.  In order to preserve pathologic integrity was felt that the lesion will be removed with rigid biopsy  forceps.  I was able to remove the tumor with 1 single extraction biopsy with complete removal of the tumor into muscularis.  Base of this lesion was then fulgurated with Bugbee electrode with good hemostasis.  No other lesions again were seen.  Bladder was deflated and reinflated and good hemostasis maintained.  Bladder was deflated and the procedure terminated.  He was awakened from anesthesia and taken back to the recovery in stable condition no immediate complication from the procedure.

## 2021-02-17 NOTE — Anesthesia Preprocedure Evaluation (Addendum)
Anesthesia Evaluation  Patient identified by MRN, date of birth, ID band Patient awake    Reviewed: Allergy & Precautions, NPO status , Patient's Chart, lab work & pertinent test results  Airway Mallampati: II  TM Distance: >3 FB     Dental   Pulmonary asthma , former smoker,    breath sounds clear to auscultation       Cardiovascular hypertension,  Rhythm:Regular Rate:Normal  History noted. Work up note to be negative per patient. No SOB or CP. Pt is asymptomatic. Dr. Nyoka Cowden   Neuro/Psych    GI/Hepatic negative GI ROS, Neg liver ROS,   Endo/Other  negative endocrine ROS  Renal/GU negative Renal ROS     Musculoskeletal  (+) Arthritis ,   Abdominal   Peds  Hematology   Anesthesia Other Findings   Reproductive/Obstetrics                            Anesthesia Physical Anesthesia Plan  ASA: 3  Anesthesia Plan: General   Post-op Pain Management:    Induction: Intravenous  PONV Risk Score and Plan: 2 and Ondansetron, Dexamethasone and Midazolam  Airway Management Planned: Oral ETT  Additional Equipment:   Intra-op Plan:   Post-operative Plan: Extubation in OR  Informed Consent: I have reviewed the patients History and Physical, chart, labs and discussed the procedure including the risks, benefits and alternatives for the proposed anesthesia with the patient or authorized representative who has indicated his/her understanding and acceptance.     Dental advisory given  Plan Discussed with: CRNA and Anesthesiologist  Anesthesia Plan Comments:        Anesthesia Quick Evaluation

## 2021-02-18 ENCOUNTER — Encounter (HOSPITAL_BASED_OUTPATIENT_CLINIC_OR_DEPARTMENT_OTHER): Payer: Self-pay | Admitting: Urology

## 2021-02-18 LAB — SURGICAL PATHOLOGY

## 2021-02-26 DIAGNOSIS — C672 Malignant neoplasm of lateral wall of bladder: Secondary | ICD-10-CM | POA: Diagnosis not present

## 2021-04-22 DIAGNOSIS — Z23 Encounter for immunization: Secondary | ICD-10-CM | POA: Diagnosis not present

## 2021-06-01 DIAGNOSIS — C672 Malignant neoplasm of lateral wall of bladder: Secondary | ICD-10-CM | POA: Diagnosis not present

## 2021-06-25 DIAGNOSIS — Z1331 Encounter for screening for depression: Secondary | ICD-10-CM | POA: Diagnosis not present

## 2021-06-25 DIAGNOSIS — Z0000 Encounter for general adult medical examination without abnormal findings: Secondary | ICD-10-CM | POA: Diagnosis not present

## 2021-06-25 DIAGNOSIS — Z9181 History of falling: Secondary | ICD-10-CM | POA: Diagnosis not present

## 2021-09-02 DIAGNOSIS — C672 Malignant neoplasm of lateral wall of bladder: Secondary | ICD-10-CM | POA: Diagnosis not present

## 2021-12-03 DIAGNOSIS — C672 Malignant neoplasm of lateral wall of bladder: Secondary | ICD-10-CM | POA: Diagnosis not present

## 2022-07-12 DIAGNOSIS — C61 Malignant neoplasm of prostate: Secondary | ICD-10-CM

## 2022-07-12 HISTORY — DX: Malignant neoplasm of prostate: C61

## 2023-03-28 DIAGNOSIS — R490 Dysphonia: Secondary | ICD-10-CM | POA: Diagnosis not present

## 2023-03-28 DIAGNOSIS — B348 Other viral infections of unspecified site: Secondary | ICD-10-CM | POA: Diagnosis not present

## 2023-03-28 DIAGNOSIS — Z87891 Personal history of nicotine dependence: Secondary | ICD-10-CM | POA: Diagnosis not present

## 2023-03-28 DIAGNOSIS — B9789 Other viral agents as the cause of diseases classified elsewhere: Secondary | ICD-10-CM | POA: Diagnosis not present

## 2023-03-28 DIAGNOSIS — R918 Other nonspecific abnormal finding of lung field: Secondary | ICD-10-CM | POA: Diagnosis not present

## 2023-03-28 DIAGNOSIS — Z1152 Encounter for screening for COVID-19: Secondary | ICD-10-CM | POA: Diagnosis not present

## 2023-03-28 DIAGNOSIS — R059 Cough, unspecified: Secondary | ICD-10-CM | POA: Diagnosis not present

## 2023-03-28 DIAGNOSIS — Z79899 Other long term (current) drug therapy: Secondary | ICD-10-CM | POA: Diagnosis not present

## 2023-03-28 DIAGNOSIS — I10 Essential (primary) hypertension: Secondary | ICD-10-CM | POA: Diagnosis not present

## 2023-03-28 DIAGNOSIS — J439 Emphysema, unspecified: Secondary | ICD-10-CM | POA: Diagnosis not present

## 2023-03-30 DIAGNOSIS — B348 Other viral infections of unspecified site: Secondary | ICD-10-CM | POA: Diagnosis not present

## 2023-03-30 DIAGNOSIS — H6122 Impacted cerumen, left ear: Secondary | ICD-10-CM | POA: Diagnosis not present

## 2023-03-30 DIAGNOSIS — J439 Emphysema, unspecified: Secondary | ICD-10-CM | POA: Diagnosis not present

## 2023-03-30 DIAGNOSIS — I288 Other diseases of pulmonary vessels: Secondary | ICD-10-CM | POA: Diagnosis not present

## 2023-04-04 ENCOUNTER — Other Ambulatory Visit: Payer: Self-pay | Admitting: Family Medicine

## 2023-04-04 DIAGNOSIS — I288 Other diseases of pulmonary vessels: Secondary | ICD-10-CM

## 2023-04-11 DIAGNOSIS — R490 Dysphonia: Secondary | ICD-10-CM | POA: Diagnosis not present

## 2023-04-26 ENCOUNTER — Inpatient Hospital Stay: Admission: RE | Admit: 2023-04-26 | Payer: Medicare HMO | Source: Ambulatory Visit

## 2023-05-05 ENCOUNTER — Ambulatory Visit
Admission: RE | Admit: 2023-05-05 | Discharge: 2023-05-05 | Disposition: A | Discharge status: Home / Self Care | Payer: 59 | Source: Ambulatory Visit | Attending: Family Medicine | Admitting: Family Medicine

## 2023-05-05 DIAGNOSIS — I700 Atherosclerosis of aorta: Secondary | ICD-10-CM | POA: Diagnosis not present

## 2023-05-05 DIAGNOSIS — R918 Other nonspecific abnormal finding of lung field: Secondary | ICD-10-CM | POA: Diagnosis not present

## 2023-05-05 DIAGNOSIS — I288 Other diseases of pulmonary vessels: Secondary | ICD-10-CM

## 2023-05-05 DIAGNOSIS — R911 Solitary pulmonary nodule: Secondary | ICD-10-CM | POA: Diagnosis not present

## 2023-05-05 DIAGNOSIS — R599 Enlarged lymph nodes, unspecified: Secondary | ICD-10-CM | POA: Diagnosis not present

## 2023-05-05 MED ORDER — IOPAMIDOL (ISOVUE-370) INJECTION 76%
200.0000 mL | Freq: Once | INTRAVENOUS | Status: AC | PRN
  Administered 2023-05-05: 80 mL via INTRAVENOUS

## 2023-06-29 DIAGNOSIS — H40003 Preglaucoma, unspecified, bilateral: Secondary | ICD-10-CM | POA: Diagnosis not present

## 2023-06-29 DIAGNOSIS — H2513 Age-related nuclear cataract, bilateral: Secondary | ICD-10-CM | POA: Diagnosis not present

## 2023-07-04 DIAGNOSIS — C672 Malignant neoplasm of lateral wall of bladder: Secondary | ICD-10-CM | POA: Diagnosis not present

## 2023-07-18 ENCOUNTER — Institutional Professional Consult (permissible substitution): Payer: 59 | Admitting: Pulmonary Disease

## 2023-07-27 ENCOUNTER — Encounter: Payer: Self-pay | Admitting: Acute Care

## 2023-07-27 ENCOUNTER — Encounter: Payer: Self-pay | Admitting: Emergency Medicine

## 2023-07-27 ENCOUNTER — Ambulatory Visit: Payer: No Typology Code available for payment source | Admitting: Acute Care

## 2023-07-27 VITALS — BP 108/58 | HR 56 | Temp 97.7°F | Ht 72.0 in | Wt 152.8 lb

## 2023-07-27 DIAGNOSIS — Z87891 Personal history of nicotine dependence: Secondary | ICD-10-CM | POA: Diagnosis not present

## 2023-07-27 DIAGNOSIS — I2721 Secondary pulmonary arterial hypertension: Secondary | ICD-10-CM

## 2023-07-27 DIAGNOSIS — Z8551 Personal history of malignant neoplasm of bladder: Secondary | ICD-10-CM

## 2023-07-27 DIAGNOSIS — R918 Other nonspecific abnormal finding of lung field: Secondary | ICD-10-CM

## 2023-07-27 DIAGNOSIS — I10 Essential (primary) hypertension: Secondary | ICD-10-CM | POA: Diagnosis not present

## 2023-07-27 NOTE — Patient Instructions (Addendum)
 It is good to see you today. We have scheduled you for a bronchoscopy to biopsy the lung mass, to get definitive diagnosis. You will get a letter today with all the details of the procedure. You do not take any blood thinners, or aspirin, which is good. You will follow up with me 1 week after the procedure to go over results of biopsy and make sure you have done well after the procedure. If you need additional imaging, I will order this and you will get a call to get this scheduled. You will need  PET scan at some point  as part of staging, as well as an MRI Brain.. Call if you need us  before the procedure. Please contact office for sooner follow up if symptoms do not improve or worsen or seek emergency care

## 2023-07-27 NOTE — H&P (View-Only) (Signed)
History of Present Illness John [name] is a 78 y.o. male former smoker , quit 10  years ago.with a 24 pack smoking history with history of allergies and recurrent bladder cancer.  Referred to Dr. Delton Coombes 07/2023 when he had a CTA with findings concerning for  synchronous primary malignancy or metastatic disease.     07/27/2023 Pt presents for evaluation of abnormal chest imaging. He has a  history of superficial transitional cell carcinoma the bladder and was found to have recurrent tumor on  surveillance in 2022. He had cystoscopy, and  had cystoscopy and TURBT and retrogrades August 2022. Recent CTA done to further evaluate an enlarged pulmonary artery now shows  pulmonary nodules concerning for synchronous primary or metastatic disease.  Pt has smoked 10-12 cigarettes per day x 49 years, he has  developed a new cough over  the past 2 months. The cough is not productive. CTA was done to evaluate a enlarged  pulmonary artery. CTA was positive for a large hilar mass which is causing narrowing of the left upper lobe pulmonary artery and superior left pulmonary vein.  There is also notation of an enlarged right hilar lymph node measuring 10 mm.  Chest imaging also revealed complete occlusion of the apicoposterior and anterior left upper lobe bronchi with distal mucous plugging ,  centrilobular nodules of the central left upper lobe likely postobstructive.  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 He is unsure if he was exposed to asbestos. He has done commercial maintenance, cleaning with chemicals, he feels he has had ammonia exposures etc.  He has no children, but a niece who is a great support. He does have allergies and nasal secretions that are clear. He denies fever, or pain.  Cough is  productive at times of clear secretions.  We have reviewed his imaging, and we have discussed that there is concern that this could be either metastatic lung cancer a new primary lung cancer however we need to determine definitively with tissue sampling.  We discussed options for biopsy.  Patient appears to be in relatively good shape for his age, states he does not take any medications on a regular basis. He denies any dyspnea, hemoptysis, or significant weight loss.  We discussed the option of bronchoscopy with biopsy.  Patient understands this involves general anesthesia.  We discussed the risks to include infection, bleeding, pneumothorax, and adverse reaction to anesthesia.  Patient wants to move forward with bronchoscopic biopsy to determine definitive diagnosis.  His niece who is here with him today agrees with this decision.  He is not on blood thinners.  Weights 1/15/025 69.3 kg, BMI 20.72 03/28/2023 68 kg   Test Results: CTA Chest 05/05/2023 Mediastinum/Nodes: Esophagus and thyroid are unremarkable. Large left hilar mass measuring 7.4 x 6.1 cm. Mass causes severe narrowing of the left upper lobe pulmonary artery and superior left pulmonary vein. Mildly enlarged right hilar lymph node measuring 10 mm.   Lungs/Pleura: Complete occlusion of the apicoposterior and anterior left upper lobe bronchi with distal mucous plugging. Centrilobular nodules of the central left upper lobe, likely postobstructive change. Spiculated solid nodule of the posterior right upper lobe with perifissural component measuring 2.4 x 1.8 cm on series 12, image 77. Lower lung incidental pleural predominant  reticular opacities with traction bronchiolectasis and honeycomb change. No pleural effusion.   Upper Abdomen: Partially visualized simple appearing cyst of the left kidney.   Musculoskeletal: No chest wall abnormality. No acute or significant osseous findings.   Review of the MIP images confirms the  above findings.   IMPRESSION: 1. Large left hilar mass, highly concerning for primary lung malignancy. 2. Spiculated solid nodule of the posterior right upper lobe, concerning for synchronous primary or metastatic disease. 3. Mildly enlarged right hilar lymph nodes, metastatic disease can not be excluded. 4. Findings consistent with interstitial lung disease     Latest Ref Rng & Units 02/17/2021    9:21 AM 05/28/2016    8:03 AM 01/09/2016   11:42 AM  CBC  Hemoglobin 13.0 - 17.0 g/dL 62.1  30.8  65.7   Hematocrit 39.0 - 52.0 % 44.0          Latest Ref Rng & Units 02/17/2021    9:21 AM 07/25/2012   10:23 AM  BMP  Glucose 70 - 99 mg/dL 96  846   BUN 8 - 23 mg/dL 10  9   Creatinine 9.62 - 1.24 mg/dL 9.52  8.41   Sodium 324 - 145 mmol/L 145  142   Potassium 3.5 - 5.1 mmol/L 3.6  3.6   Chloride 98 - 111 mmol/L 108  105   CO2 19 - 32 mEq/L  29   Calcium 8.4 - 10.5 mg/dL  40.1     BNP No results found for: "BNP"  ProBNP No results found for: "PROBNP"  PFT No results found for: "FEV1PRE", "FEV1POST", "FVCPRE", "FVCPOST", "TLC", "DLCOUNC", "PREFEV1FVCRT", "PSTFEV1FVCRT"  No results found.   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   worked up with cardiology dr hochrein and f/u prn   Nocturia 02/11/2021   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   Wears glasses 02/11/2021     Social History   Tobacco Use   Smoking status: Former     Current packs/day: 0.00    Average packs/day: 0.5 packs/day for 42.0 years (21.0 ttl pk-yrs)    Types: Cigarettes    Start date: 10/28/1972    Quit date: 10/29/2014    Years since quitting: 8.7   Smokeless tobacco: Never  Vaping Use   Vaping status: Never Used  Substance Use Topics   Alcohol use: Yes    Comment: occasionally blended whiskey    Drug use: Yes    Types: Marijuana    John [name] reports that he quit smoking about 8 years ago. His smoking use included cigarettes. He started smoking about 50 years ago. He has a 21 pack-year smoking history. He has never used smokeless tobacco. He reports current alcohol use. He reports current drug use. Drug: Marijuana.  Tobacco Cessation: Former smoker quit 2016 with a 21-pack-year smoking history   Past surgical hx, Family hx, Social hx all reviewed.  Current Outpatient Medications on File Prior to Visit  Medication Sig   amLODipine (NORVASC) 10 MG tablet Take 10 mg by mouth daily.   benzonatate (TESSALON) 100 MG capsule Take 100 mg by mouth every 8 (eight) hours.   No current facility-administered medications on file prior to visit.     No Known Allergies  Review Of Systems:  Constitutional:   +  weight loss, Nonight sweats,  Fevers, chills, fatigue, or  lassitude.  HEENT:   No headaches,  Difficulty swallowing,  Tooth/dental problems, or  Sore throat,                No sneezing, itching, ear ache, nasal congestion, post nasal drip,   CV:  No chest pain,  Orthopnea, PND, swelling in lower extremities, anasarca, dizziness, palpitations, syncope.   GI  No heartburn, indigestion, abdominal pain, nausea, vomiting, diarrhea, change in bowel habits, loss of appetite, bloody stools.   Resp: No shortness of breath with exertion or at rest.  No excess mucus, + new  productive cough,  + new non-productive cough,  No coughing up of blood.  No change in color of mucus.  No wheezing.  No chest wall deformity  Skin: no rash or  lesions.  GU: no dysuria, change in color of urine, no urgency or frequency.  No flank pain, no hematuria   MS:  No joint pain or swelling.  No decreased range of motion.  No back pain.  Psych:  No change in mood or affect. No depression or anxiety.  No memory loss.   Vital Signs BP (!) 108/58 (BP Location: Left Arm, Cuff Size: Normal)   Pulse (!) 56   Temp 97.7 F (36.5 C) (Temporal)   Ht 6' (1.829 m)   Wt 152 lb 12.8 oz (69.3 kg)   SpO2 99%   BMI 20.72 kg/m    Physical Exam:  General- No distress,  A&Ox3, pleasant ENT: No sinus tenderness, TM clear, pale nasal mucosa, no oral exudate,no post nasal drip, no LAN, hoarse voice that has developed with the cough and most likely is related to his pulmonary hypertension Cardiac: S1, S2, regular rate and rhythm, no murmur Chest: No wheeze/ rales/ dullness; no accessory muscle use, no nasal flaring, no sternal retractions Abd.: Soft Non-tender, nondistended, bowel sounds positive,Body mass index is 20.72 kg/m.  Ext: No clubbing cyanosis, edema Neuro:  normal strength, moving all extremities x 4, alert and oriented x 3, very appropriate and pleasant Skin: No rashes, warm and dry, no obvious lesions Psych: normal mood and behavior   Assessment/Plan Lung Nodules and Mediastinal Mass New findings on surveillance imaging in a patient with a history of recurrent bladder cancer and a significant smoking history.  The differential diagnosis includes metastatic bladder cancer, primary lung cancer.  The patient has been experiencing a new cough for the past two months, which is related to elevated pulmonary artery pressure secondary to large hilar mass - No hemoptysis -Schedule bronchoscopy with biopsy of the lung mass for definitive diagnosis. -Will order a super DCT scan for navigation if needed. -Follow-up appointment on February 4th, 2025 at 10:15 AM to discuss biopsy results.  Recurrent Bladder Cancer Managed by [facility].  The patient has been undergoing surveillance imaging. The current concern is whether the newly discovered lung nodules and mediastinal mass represent metastatic bladder cancer. -Continue current management plan with [facility].  Smoking History The patient has a significant smoking history of approximately 49 pack-years, although he quit several  years ago.  -Advised the patient to continue abstaining from smoking,   Hypertension Managed with amlodipine. -Continue current management plan.  Allergies The patient has a history of seasonal allergies, which may be contributing to his current cough symptoms. -Continue current management plan.   Follow up and bronchoscopy details July 27, 2023   John [name] [address] [place] [address]   Dear John [name],  The following has been scheduled for you:   Procedure: Bronchoscopy                 Surgeon:  Dr Delton Coombes   Procedure date:  08/08/23 at 10:00 am Arrival Time:  7:30 am Location: [facility] [address]tr[address] [place], [place] [address]   Additional Information: Do not eat anything after midnight - ok to take medications on the day of procedure with only sips of water You will need someone to drive you home after this procedure & to be with you for the next 24 hours You may receive a call from the hospital a day or two prior to procedure to go over additional instructions & medications  NEXT VISIT:  08/16/23 arrive at 10:15 am                     With Kandice Robinsons, NP at    [facility]    [address] [place], [place]  [address]   I spent 50 minutes dedicated to the care of this patient on the date of this encounter to include pre-visit review of records, face-to-face time with the patient discussing conditions above, post visit ordering of testing, clinical documentation with the electronic health record, making appropriate referrals as documented, and communicating  necessary information to the patient's healthcare team.   Bevelyn Ngo, NP 07/27/2023  10:39 PM

## 2023-07-27 NOTE — Progress Notes (Addendum)
 History of Present Illness John [name] is a 78 y.o. male former smoker , quit 10  years ago.with a 24 pack smoking history with history of allergies and recurrent bladder cancer.  Referred to Dr. Delton Coombes 07/2023 when he had a CTA with findings concerning for  synchronous primary malignancy or metastatic disease.     07/27/2023 Pt presents for evaluation of abnormal chest imaging. He has a  history of superficial transitional cell carcinoma the bladder and was found to have recurrent tumor on  surveillance in 2022. He had cystoscopy, and  had cystoscopy and TURBT and retrogrades August 2022. Recent CTA done to further evaluate an enlarged pulmonary artery now shows  pulmonary nodules concerning for synchronous primary or metastatic disease.  Pt has smoked 10-12 cigarettes per day x 49 years, he has  developed a new cough over  the past 2 months. The cough is not productive. CTA was done to evaluate a enlarged  pulmonary artery. CTA was positive for a large hilar mass which is causing narrowing of the left upper lobe pulmonary artery and superior left pulmonary vein.  There is also notation of an enlarged right hilar lymph node measuring 10 mm.  Chest imaging also revealed complete occlusion of the apicoposterior and anterior left upper lobe bronchi with distal mucous plugging ,  centrilobular nodules of the central left upper lobe likely postobstructive.  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 He is unsure if he was exposed to asbestos. He has done commercial maintenance, cleaning with chemicals, he feels he has had ammonia exposures etc.  He has no children, but a niece who is a great support. He does have allergies and nasal secretions that are clear. He denies fever, or pain.  Cough is  productive at times of clear secretions.  We have reviewed his imaging, and we have discussed that there is concern that this could be either metastatic lung cancer a new primary lung cancer however we need to determine definitively with tissue sampling.  We discussed options for biopsy.  Patient appears to be in relatively good shape for his age, states he does not take any medications on a regular basis. He denies any dyspnea, hemoptysis, or significant weight loss.  We discussed the option of bronchoscopy with biopsy.  Patient understands this involves general anesthesia.  We discussed the risks to include infection, bleeding, pneumothorax, and adverse reaction to anesthesia.  Patient wants to move forward with bronchoscopic biopsy to determine definitive diagnosis.  His niece who is here with him today agrees with this decision.  He is not on blood thinners.  Weights 1/15/025 69.3 kg, BMI 20.72 03/28/2023 68 kg   Test Results: CTA Chest 05/05/2023 Mediastinum/Nodes: Esophagus and thyroid are unremarkable. Large left hilar mass measuring 7.4 x 6.1 cm. Mass causes severe narrowing of the left upper lobe pulmonary artery and superior left pulmonary vein. Mildly enlarged right hilar lymph node measuring 10 mm.   Lungs/Pleura: Complete occlusion of the apicoposterior and anterior left upper lobe bronchi with distal mucous plugging. Centrilobular nodules of the central left upper lobe, likely postobstructive change. Spiculated solid nodule of the posterior right upper lobe with perifissural component measuring 2.4 x 1.8 cm on series 12, image 77. Lower lung incidental pleural predominant  reticular opacities with traction bronchiolectasis and honeycomb change. No pleural effusion.   Upper Abdomen: Partially visualized simple appearing cyst of the left kidney.   Musculoskeletal: No chest wall abnormality. No acute or significant osseous findings.   Review of the MIP images confirms the  above findings.   IMPRESSION: 1. Large left hilar mass, highly concerning for primary lung malignancy. 2. Spiculated solid nodule of the posterior right upper lobe, concerning for synchronous primary or metastatic disease. 3. Mildly enlarged right hilar lymph nodes, metastatic disease can not be excluded. 4. Findings consistent with interstitial lung disease     Latest Ref Rng & Units 02/17/2021    9:21 AM 05/28/2016    8:03 AM 01/09/2016   11:42 AM  CBC  Hemoglobin 13.0 - 17.0 g/dL 62.1  30.8  65.7   Hematocrit 39.0 - 52.0 % 44.0          Latest Ref Rng & Units 02/17/2021    9:21 AM 07/25/2012   10:23 AM  BMP  Glucose 70 - 99 mg/dL 96  846   BUN 8 - 23 mg/dL 10  9   Creatinine 9.62 - 1.24 mg/dL 9.52  8.41   Sodium 324 - 145 mmol/L 145  142   Potassium 3.5 - 5.1 mmol/L 3.6  3.6   Chloride 98 - 111 mmol/L 108  105   CO2 19 - 32 mEq/L  29   Calcium 8.4 - 10.5 mg/dL  40.1     BNP No results found for: "BNP"  ProBNP No results found for: "PROBNP"  PFT No results found for: "FEV1PRE", "FEV1POST", "FVCPRE", "FVCPOST", "TLC", "DLCOUNC", "PREFEV1FVCRT", "PSTFEV1FVCRT"  No results found.   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   worked up with cardiology dr hochrein and f/u prn   Nocturia 02/11/2021   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   Wears glasses 02/11/2021     Social History   Tobacco Use   Smoking status: Former     Current packs/day: 0.00    Average packs/day: 0.5 packs/day for 42.0 years (21.0 ttl pk-yrs)    Types: Cigarettes    Start date: 10/28/1972    Quit date: 10/29/2014    Years since quitting: 8.7   Smokeless tobacco: Never  Vaping Use   Vaping status: Never Used  Substance Use Topics   Alcohol use: Yes    Comment: occasionally blended whiskey    Drug use: Yes    Types: Marijuana    Mr.Plessinger reports that he quit smoking about 8 years ago. His smoking use included cigarettes. He started smoking about 50 years ago. He has a 21 pack-year smoking history. He has never used smokeless tobacco. He reports current alcohol use. He reports current drug use. Drug: Marijuana.  Tobacco Cessation: Former smoker quit 2016 with a 21-pack-year smoking history   Past surgical hx, Family hx, Social hx all reviewed.  Current Outpatient Medications on File Prior to Visit  Medication Sig   amLODipine (NORVASC) 10 MG tablet Take 10 mg by mouth daily.   benzonatate (TESSALON) 100 MG capsule Take 100 mg by mouth every 8 (eight) hours.   No current facility-administered medications on file prior to visit.     No Known Allergies  Review Of Systems:  Constitutional:   +  weight loss, Nonight sweats,  Fevers, chills, fatigue, or  lassitude.  HEENT:   No headaches,  Difficulty swallowing,  Tooth/dental problems, or  Sore throat,                No sneezing, itching, ear ache, nasal congestion, post nasal drip,   CV:  No chest pain,  Orthopnea, PND, swelling in lower extremities, anasarca, dizziness, palpitations, syncope.   GI  No heartburn, indigestion, abdominal pain, nausea, vomiting, diarrhea, change in bowel habits, loss of appetite, bloody stools.   Resp: No shortness of breath with exertion or at rest.  No excess mucus, + new  productive cough,  + new non-productive cough,  No coughing up of blood.  No change in color of mucus.  No wheezing.  No chest wall deformity  Skin: no rash or  lesions.  GU: no dysuria, change in color of urine, no urgency or frequency.  No flank pain, no hematuria   MS:  No joint pain or swelling.  No decreased range of motion.  No back pain.  Psych:  No change in mood or affect. No depression or anxiety.  No memory loss.   Vital Signs BP (!) 108/58 (BP Location: Left Arm, Cuff Size: Normal)   Pulse (!) 56   Temp 97.7 F (36.5 C) (Temporal)   Ht 6' (1.829 m)   Wt 152 lb 12.8 oz (69.3 kg)   SpO2 99%   BMI 20.72 kg/m    Physical Exam:  General- No distress,  A&Ox3, pleasant ENT: No sinus tenderness, TM clear, pale nasal mucosa, no oral exudate,no post nasal drip, no LAN, hoarse voice that has developed with the cough and most likely is related to his pulmonary hypertension Cardiac: S1, S2, regular rate and rhythm, no murmur Chest: No wheeze/ rales/ dullness; no accessory muscle use, no nasal flaring, no sternal retractions Abd.: Soft Non-tender, nondistended, bowel sounds positive,Body mass index is 20.72 kg/m.  Ext: No clubbing cyanosis, edema Neuro:  normal strength, moving all extremities x 4, alert and oriented x 3, very appropriate and pleasant Skin: No rashes, warm and dry, no obvious lesions Psych: normal mood and behavior   Assessment/Plan Lung Nodules and Mediastinal Mass New findings on surveillance imaging in a patient with a history of recurrent bladder cancer and a significant smoking history.  The differential diagnosis includes metastatic bladder cancer, primary lung cancer.  The patient has been experiencing a new cough for the past two months, which is related to elevated pulmonary artery pressure secondary to large hilar mass - No hemoptysis -Schedule bronchoscopy with biopsy of the lung mass for definitive diagnosis. -Will order a super DCT scan for navigation if needed. -Follow-up appointment on February 4th, 2025 at 10:15 AM to discuss biopsy results.  Recurrent Bladder Cancer Managed by [facility].  The patient has been undergoing surveillance imaging. The current concern is whether the newly discovered lung nodules and mediastinal mass represent metastatic bladder cancer. -Continue current management plan with [facility].  Smoking History The patient has a significant smoking history of approximately 49 pack-years, although he quit several  years ago.  -Advised the patient to continue abstaining from smoking,   Hypertension Managed with amlodipine. -Continue current management plan.  Allergies The patient has a history of seasonal allergies, which may be contributing to his current cough symptoms. -Continue current management plan.   Follow up and bronchoscopy details July 27, 2023   CHASON MCIVER [address] [place] [address]   Dear Mr. Dishman,  The following has been scheduled for you:   Procedure: Bronchoscopy                 Surgeon:  Dr Delton Coombes   Procedure date:  08/08/23 at 10:00 am Arrival Time:  7:30 am Location: [facility] [address] [place], [place] [address]   Additional Information: Do not eat anything after midnight - ok to take medications on the day of procedure with only sips of water You will need someone to drive you home after this procedure & to be with you for the next 24 hours You may receive a call from the hospital a day or two prior to procedure to go over additional instructions & medications  NEXT VISIT:  08/16/23 arrive at 10:15 am                     With Kandice Robinsons, NP at    [facility]    [address] [place], [place]  [address]   I spent 50 minutes dedicated to the care of this patient on the date of this encounter to include pre-visit review of records, face-to-face time with the patient discussing conditions above, post visit ordering of testing, clinical documentation with the electronic health record, making appropriate referrals as documented, and communicating  necessary information to the patient's healthcare team.   Bevelyn Ngo, NP 07/27/2023  10:39 PM

## 2023-08-03 ENCOUNTER — Ambulatory Visit
Admission: RE | Admit: 2023-08-03 | Discharge: 2023-08-03 | Disposition: A | Discharge status: Home / Self Care | Payer: No Typology Code available for payment source | Source: Ambulatory Visit | Attending: Acute Care | Admitting: Acute Care

## 2023-08-03 DIAGNOSIS — R918 Other nonspecific abnormal finding of lung field: Secondary | ICD-10-CM

## 2023-08-03 DIAGNOSIS — I700 Atherosclerosis of aorta: Secondary | ICD-10-CM | POA: Diagnosis not present

## 2023-08-03 DIAGNOSIS — J439 Emphysema, unspecified: Secondary | ICD-10-CM | POA: Diagnosis not present

## 2023-08-03 DIAGNOSIS — J849 Interstitial pulmonary disease, unspecified: Secondary | ICD-10-CM | POA: Diagnosis not present

## 2023-08-03 DIAGNOSIS — C3411 Malignant neoplasm of upper lobe, right bronchus or lung: Secondary | ICD-10-CM | POA: Diagnosis not present

## 2023-08-04 ENCOUNTER — Other Ambulatory Visit: Payer: Self-pay

## 2023-08-04 ENCOUNTER — Encounter (HOSPITAL_COMMUNITY): Payer: Self-pay | Admitting: Emergency Medicine

## 2023-08-04 NOTE — Progress Notes (Signed)
PCP - Lonie Peak, PA-C  Cardiologist denies  PPM/ICD - denies Device Orders - n/a Rep Notified - n/a  Chest x-ray -  Chest CT 08-03-23 EKG - DOS Stress Test -  ECHO - 07-23-16 Cardiac Cath -   CPAP - denies  DM -denies  Blood Thinner Instructions: denies Aspirin Instructions: n/a  ERAS Protcol - NPO  COVID TEST- n/a  Anesthesia review: yes cardiac hx, HTN  Patient verbally denies any shortness of breath, fever, cough and chest pain during phone call   -------------  SDW INSTRUCTIONS given:  Your procedure is scheduled on August 08, 2023.  Report to [facility] Main Entrance "A" at 7:30 A.M., and check in at the Admitting office.  Call this number if you have problems the morning of surgery:  [phone]   Remember:  Do not eat  or drink after midnight the night before your surgery      Take these medicines the morning of surgery with A SIP OF WATER  amLODipine (NORVASC)    As of today, STOP taking any Aspirin (unless otherwise instructed by your surgeon) Aleve, Naproxen, Ibuprofen, Motrin, Advil, Goody's, BC's, all herbal medications, fish oil, and all vitamins.                      Do not wear jewelry, make up, or nail polish            Do not wear lotions, powders, perfumes/colognes, or deodorant.            Do not shave 48 hours prior to surgery.  Men may shave face and neck.            Do not bring valuables to the hospital.            [facility] is not responsible for any belongings or valuables.  Do NOT Smoke (Tobacco/Vaping) 24 hours prior to your procedure If you use a CPAP at night, you may bring all equipment for your overnight stay.   Contacts, glasses, dentures or bridgework may not be worn into surgery.      For patients admitted to the hospital, discharge time will be determined by your treatment team.   Patients discharged the day of surgery will not be allowed to drive home, and someone needs to stay with them for 24  hours.    Special instructions:   - Preparing For Surgery  Before surgery, you can play an important role. Because skin is not sterile, your skin needs to be as free of germs as possible. You can reduce the number of germs on your skin by washing with CHG (chlorahexidine gluconate) Soap before surgery.  CHG is an antiseptic cleaner which kills germs and bonds with the skin to continue killing germs even after washing.    Oral Hygiene is also important to reduce your risk of infection.  Remember - BRUSH YOUR TEETH THE MORNING OF SURGERY WITH YOUR REGULAR TOOTHPASTE  Please do not use if you have an allergy to CHG or antibacterial soaps. If your skin becomes reddened/irritated stop using the CHG.  Do not shave (including legs and underarms) for at least 48 hours prior to first CHG shower. It is OK to shave your face.  Please follow these instructions carefully.   Shower the NIGHT BEFORE SURGERY and the MORNING OF SURGERY with DIAL Soap.   Pat yourself dry with a CLEAN TOWEL.  Wear CLEAN PAJAMAS to bed the night before surgery  Place CLEAN SHEETS on your bed the night of your first shower and DO NOT SLEEP WITH PETS.   Day of Surgery: Please shower morning of surgery  Wear Clean/Comfortable clothing the morning of surgery Do not apply any deodorants/lotions.   Remember to brush your teeth WITH YOUR REGULAR TOOTHPASTE.   Questions were answered. Patient verbalized understanding of instructions.

## 2023-08-05 ENCOUNTER — Encounter (HOSPITAL_COMMUNITY): Payer: Self-pay | Admitting: Emergency Medicine

## 2023-08-05 NOTE — Progress Notes (Signed)
Anesthesia Chart Review: John [name]  Case: [id number] Date/Time: 08/08/23 1000   Procedure: ROBOTIC ASSISTED NAVIGATIONAL BRONCHOSCOPY   Anesthesia type: General   Pre-op diagnosis: left hilar mass, right upper lobe bilateral   [facility] / [facility]   Surgeons: Leslye Peer, MD       DISCUSSION: Patient is a 78 year old male scheduled for the above procedure.  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 + Alcohol and marijuana use.   He was evaluated by primary care on 03/30/23 by Burnell Blanks, MD, after he was evaluated at [facility] on 03/28/23 for URI symptoms x 6 days and was diagnosed with rhinovirus/enterovirus (see [facility] Everywhere). CXR showed no pneumonia, but enlarged pulmonary arteries, consider further evaluation with CTA chest or echo. Dr. Nathanial Rancher reviewed his echo from 07/23/16 showing normal LV systolic function with EF 55-60%, no regional wall motion abnormalities, moderate LVH, grade 1 diastolic dysfunction, normal size pulmonary arteries with normal PA systolic pressure, moderate AR. She opted to order a CTA of the chest to further evaluation. This was done on 05/05/23 and showed a large left hilar mass concerning for primary lung malignancy, a spiculated RUL nodule concerning for synchronous primary or metastatic disease, mildly enlarged right hilar lymph nodes, and findings consistent with ILD. She referred him to [facility] Pulmonology for further evaluation.   Of note, previous PCP routine visit with Lonie Peak, PA-C on 02/03/23 (included in scanned records) noted a II/VI diastolic murmur in the aortic area. He was asymptomatic and walking daily for exercise (up to > 5 miles). He denied weakness,  dizziness, palpitations. No edema on exam. PA noted moderate AR history from prior echo on 07/23/16. He did not order further testing at that time due to being asymptomatic. 03/30/23 ED provider also noted that patient is very active walking 5.1 miles on a regular basis for exercise. He denied chest pain, dyspnea, palpitations, syncope, presyncope, fatigue, leg edema, and therefore out-patient follow-up of CXR findings was felt appropriate.  He had previously been followed by cardiologist Rollene Rotunda, MD for bradycardia with 2nd degree AV block Mobitz type 1. Patient was asymptomatic. BP well controlled on amlodipine. Patient with excellent activity tolerance. No changes made and as needed cardiology follow-up recommended at 08/11/18 office visit.    Preoperative EKG on 02/17/21 also showed Mobitz I 2nd-degree AV block (Wenckebach block). He is not on b-blocker therapy.   Patient with known moderate AR from 2018 and history of Mobitz 1 2nd degree AV block dating back to at least 2020 with as needed cardiology follow-up at that time given no symptoms. Still recently very active. Unfortunately, now with new left hilar mass and RUL lung nodules concerning for malignancy. He is a former smoker with prior prostate and bladder cancers. Above procedure recommended in hopes tissue diagnosis to guide management.  He is a same day work-up. Anesthesia team to evaluate on the day of surgery.   VS: Ht 6' (1.829 m)   Wt 69.3 kg   BMI 20.72 kg/m  BP Readings from Last 3 Encounters:  07/27/23 (!) 108/58  02/17/21 (!) 148/51  08/11/18 (!) 112/52   Pulse Readings from Last 3 Encounters:  07/27/23 (!) 56  02/17/21 61  08/11/18 66     PROVIDERS: Lonie Peak, PA-C  is PCP [facility]) Rollene Rotunda, MD  Wilkie Aye, MD is urologist Liliane Shi, DO is GI   LABS: For day of surgery as indicated. Per records scanned under Media tab, A1c 5.2%, glucose 75, BUN 11,  Creatinine 1.10, Na 146, K 4.2, Ca 9.8, albumin 4.2, AST 17, ALT 8, WBC 8.6, H/H 14.1/43.2, PLT 218.     IMAGES: CT Super D Chest 08/03/23: Sinus rhythm with 2nd degree A-V block (Mobitz I) Minimal voltage criteria for LVH, may be normal variant ( Cornell product ) Abnormal ECG Since previous tracing Mobitz I 2-degree AV block (Wenckebach block) is now present Confirmed by [place], Peter [phone]) on 02/17/2021 5:06:24 PM  CTA Chest 05/05/23: IMPRESSION: 1. Large left hilar mass, highly concerning for primary lung malignancy. 2. Spiculated solid nodule of the posterior right upper lobe, concerning for synchronous primary or metastatic disease. 3. Mildly enlarged right hilar lymph nodes, metastatic disease can not be excluded. 4. Findings consistent with interstitial lung disease. Findings are consistent with UIP per consensus guidelines: Diagnosis of Idiopathic Pulmonary Fibrosis: An Official ATS/ERS/JRS/ALAT Clinical Practice Guideline. Am Rosezetta Schlatter Crit Care Med Vol 198, Iss 5, [phone], Mar 12 2017. 5. Coronary artery calcifications, aortic Atherosclerosis (ICD10-I70.0) and Emphysema (ICD10-J43.9).    EKG: EKG 02/17/21: Sinus rhythm with 2nd degree A-V block (Mobitz I) Minimal voltage criteria for LVH, may be normal variant ( Cornell product ) Abnormal ECG Since previous tracing Mobitz I 2-degree AV block (Wenckebach block) is now present Confirmed by [place], Peter [phone]) on 02/17/2021 5:06:24 PM   CV: Echo 07/23/16: Study Conclusions  - Left ventricle: The cavity size was normal. There was moderate    concentric hypertrophy. Systolic function was normal. The    estimated ejection fraction was in the range of 55% to 60%. Wall    motion was normal; there were no regional wall motion    abnormalities. Doppler parameters are consistent with abnormal    left ventricular relaxation (grade 1 diastolic dysfunction).    There was no evidence of elevated ventricular filling pressure by     Doppler parameters.  - Aortic valve: Trileaflet; mildly thickened, mildly calcified    leaflets. There was moderate regurgitation.  - Aortic root: The aortic root was normal in size.  - Ascending aorta: The ascending aorta was normal in size.  - Mitral valve: Structurally normal valve.  - Right ventricle: Systolic function was normal.  - Tricuspid valve: There was no regurgitation.  - Pulmonic valve: There was no regurgitation.  - Pulmonary arteries: Systolic pressure was within the normal    range.  - Inferior vena cava: The vessel was normal in size.  - Pericardium, extracardiac: There was no pericardial effusion.  - Impressions: Mildly thickened, mildly calcified aortic valve,    there is moderate aortic regurgitation, no stenosis.    There is normal left ventricular size and function, moderate    concentric LVH.    Normal size of the ascending aorta.   Impressions:  - Mildly thickened, mildly calcified aortic valve, there is    moderate aortic regurgitation, no stenosis.    There is normal left ventricular size and function, moderate    concentric LVH.    Normal size of the ascending aorta.    24 Hour Holter monitor 07/08/16: Sinus with AVG HR of 68 bpm. Intermittent 2nd  AV block with 2:1 conduction. Appears to have Wenckebach periodicity. Rare PVC's and isolated Escape beats. Some SV. Isolated fusion beat. Isolated PAC.  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 at a level above the muscularis mucosa (per path report)   Frequency of urination 02/11/2021   History of prostate cancer 2014   dx 2013--  Stage T1c,  Gleason 3+3,  PSA 6.38,  vol 25.41cc--  s/p  radioactive seed implants 08-01-2012 urologist-  dr Ronne Binning--  last PSA 0.8  in April 2017   Hydrocele     Hypertension 02/11/2021   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   worked up with cardiology dr hochrein and f/u prn   Nocturia 02/11/2021   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   Wears glasses 02/11/2021    Past Surgical History:  Procedure Laterality Date   ABDOMINAL HERNIA REPAIR  1990's   CYSTOSCOPY W/ RETROGRADES N/A 11/03/2015   Procedure: CYSTOSCOPY WITH RETROGRADE PYELOGRAM;  Surgeon: Malen Gauze, MD;  Location: [facility];  Service: Urology;  Laterality: N/A;   CYSTOSCOPY W/ RETROGRADES Left 01/09/2016   Procedure: CYSTOSCOPY WITH LEFT RETROGRADE PYELOGRAM;  Surgeon: Malen Gauze, MD;  Location: [facility];  Service: Urology;  Laterality: Left;   CYSTOSCOPY W/ RETROGRADES Bilateral 05/28/2016   Procedure: CYSTOSCOPY WITH RETROGRADE PYELOGRAM;  Surgeon: Malen Gauze, MD;  Location: [facility];  Service: Urology;  Laterality: Bilateral;   CYSTOSCOPY W/ RETROGRADES Bilateral 02/17/2021   Procedure: CYSTOSCOPY WITH RETROGRADE PYELOGRAM;  Surgeon: Belva Agee, MD;  Location: [facility];  Service: Urology;  Laterality: Bilateral;   CYSTOSCOPY W/ URETERAL STENT REMOVAL Left 01/09/2016   Procedure: CYSTOSCOPY WITH LEFT STENT EXCHANGE;  Surgeon: Malen Gauze, MD;  Location: [facility];  Service: Urology;  Laterality: Left;   CYSTOSCOPY WITH BIOPSY N/A 05/28/2016   Procedure: CYSTOSCOPY WITH BIOPSY;  Surgeon: Malen Gauze, MD;  Location: [facility];  Service: Urology;  Laterality: N/A;   CYSTOSCOPY WITH URETHRAL DILATATION N/A 05/28/2016   Procedure: CYSTOSCOPY WITH URETHRAL DILATATION;  Surgeon: Malen Gauze, MD;  Location: [facility];  Service: Urology;  Laterality: N/A;   PROSTATE BIOPSY  05/08/12   Adenocarcinoma   RADIOACTIVE SEED IMPLANT  08/01/2012   Procedure: RADIOACTIVE SEED IMPLANT;  Surgeon: Lindaann Slough, MD;  Location:  [facility];  Service: Urology;  Laterality: N/A;  73 seeds implanted no seeds found in bladder   TRANSURETHRAL RESECTION OF BLADDER TUMOR N/A 11/03/2015   Procedure: TRANSURETHRAL RESECTION OF BLADDER TUMOR (TURBT)WITH INSERTION STENT;  Surgeon: Malen Gauze, MD;  Location: [facility];  Service: Urology;  Laterality: N/A;   TRANSURETHRAL RESECTION OF BLADDER TUMOR N/A 02/17/2021   Procedure: TRANSURETHRAL RESECTION OF BLADDER TUMOR (TURBT);  Surgeon: Belva Agee, MD;  Location: [facility];  Service: Urology;  Laterality: N/A;  30 MINS   TRANSURETHRAL RESECTION OF BLADDER TUMOR WITH GYRUS (TURBT-GYRUS) N/A 01/09/2016   Procedure: TRANSURETHRAL RESECTION OF BLADDER TUMOR WITH GYRUS (TURBT-GYRUS);  Surgeon: Malen Gauze, MD;  Location: [facility];  Service: Urology;  Laterality: N/A;    MEDICATIONS: No current facility-administered medications for this encounter.    amLODipine (NORVASC) 10 MG tablet   benzonatate (TESSALON) 100 MG capsule     Shonna Chock, PA-C Surgical Short Stay/Anesthesiology [facility] Phone [phone] [facility]  Phone [phone] 08/05/2023 11:00 AM

## 2023-08-05 NOTE — Anesthesia Preprocedure Evaluation (Signed)
Anesthesia Evaluation  Patient identified by MRN, date of birth, ID band Patient awake    Reviewed: Allergy & Precautions, H&P , NPO status , Patient's Chart, lab work & pertinent test results  Airway Mallampati: I  TM Distance: >3 FB Neck ROM: Full    Dental  (+) Teeth Intact, Dental Advisory Given   Pulmonary former smoker 21 pack year history, quit 2016   Pulmonary exam normal breath sounds clear to auscultation       Cardiovascular hypertension (129/59 preop), Pt. on medications Normal cardiovascular exam+ dysrhythmias (1st deg avb) + Valvular Problems/Murmurs (mod AI) AI  Rhythm:Regular Rate:Normal  Echo 2018 - Left ventricle: The cavity size was normal. There was moderate    concentric hypertrophy. Systolic function was normal. The    estimated ejection fraction was in the range of 55% to 60%. Wall    motion was normal; there were no regional wall motion    abnormalities. Doppler parameters are consistent with abnormal    left ventricular relaxation (grade 1 diastolic dysfunction).    There was no evidence of elevated ventricular filling pressure by    Doppler parameters.  - Aortic valve: Trileaflet; mildly thickened, mildly calcified    leaflets. There was moderate regurgitation.  - Aortic root: The aortic root was normal in size.  - Ascending aorta: The ascending aorta was normal in size.  - Mitral valve: Structurally normal valve.  - Right ventricle: Systolic function was normal.  - Tricuspid valve: There was no regurgitation.  - Pulmonic valve: There was no regurgitation.  - Pulmonary arteries: Systolic pressure was within the normal    range.  - Inferior vena cava: The vessel was normal in size.  - Pericardium, extracardiac: There was no pericardial effusion.  - Impressions: Mildly thickened, mildly calcified aortic valve,    there is moderate aortic regurgitation, no stenosis.    There is normal left ventricular  size and function, moderate    concentric LVH.    Normal size of the ascending aorta.     Neuro/Psych negative neurological ROS  negative psych ROS   GI/Hepatic negative GI ROS, Neg liver ROS,,,  Endo/Other  negative endocrine ROS    Renal/GU negative Renal ROS Bladder dysfunction (bladder ca)      Musculoskeletal  (+) Arthritis , Osteoarthritis,    Abdominal   Peds negative pediatric ROS (+)  Hematology negative hematology ROS (+)   Anesthesia Other Findings   Reproductive/Obstetrics negative OB ROS                              Anesthesia Physical Anesthesia Plan  ASA: 3  Anesthesia Plan: General   Post-op Pain Management: Tylenol PO (pre-op)*   Induction: Intravenous  PONV Risk Score and Plan: Ondansetron, Dexamethasone and Treatment may vary due to age or medical condition  Airway Management Planned: Oral ETT  Additional Equipment: None  Intra-op Plan:   Post-operative Plan: Extubation in OR  Informed Consent: I have reviewed the patients History and Physical, chart, labs and discussed the procedure including the risks, benefits and alternatives for the proposed anesthesia with the patient or authorized representative who has indicated his/her understanding and acceptance.     Dental advisory given  Plan Discussed with: CRNA  Anesthesia Plan Comments: (PA  )        Anesthesia Quick Evaluation

## 2023-08-08 ENCOUNTER — Ambulatory Visit (HOSPITAL_COMMUNITY)
Admission: RE | Admit: 2023-08-08 | Discharge: 2023-08-08 | Disposition: A | Discharge status: Home / Self Care | Payer: No Typology Code available for payment source | Attending: Emergency Medicine | Admitting: Emergency Medicine

## 2023-08-08 ENCOUNTER — Ambulatory Visit (HOSPITAL_COMMUNITY): Payer: Self-pay | Admitting: Vascular Surgery

## 2023-08-08 ENCOUNTER — Ambulatory Visit (HOSPITAL_COMMUNITY): Payer: No Typology Code available for payment source

## 2023-08-08 ENCOUNTER — Encounter (HOSPITAL_COMMUNITY)
Admission: RE | Disposition: A | Discharge status: Home / Self Care | Payer: Self-pay | Source: Home / Self Care | Attending: Emergency Medicine

## 2023-08-08 ENCOUNTER — Encounter (HOSPITAL_COMMUNITY): Payer: Self-pay | Admitting: Emergency Medicine

## 2023-08-08 ENCOUNTER — Ambulatory Visit (HOSPITAL_BASED_OUTPATIENT_CLINIC_OR_DEPARTMENT_OTHER): Payer: Self-pay | Admitting: Vascular Surgery

## 2023-08-08 ENCOUNTER — Other Ambulatory Visit: Payer: Self-pay

## 2023-08-08 DIAGNOSIS — R9389 Abnormal findings on diagnostic imaging of other specified body structures: Secondary | ICD-10-CM | POA: Diagnosis not present

## 2023-08-08 DIAGNOSIS — Z8546 Personal history of malignant neoplasm of prostate: Secondary | ICD-10-CM | POA: Insufficient documentation

## 2023-08-08 DIAGNOSIS — Z48813 Encounter for surgical aftercare following surgery on the respiratory system: Secondary | ICD-10-CM | POA: Diagnosis not present

## 2023-08-08 DIAGNOSIS — Z87891 Personal history of nicotine dependence: Secondary | ICD-10-CM | POA: Insufficient documentation

## 2023-08-08 DIAGNOSIS — I10 Essential (primary) hypertension: Secondary | ICD-10-CM | POA: Diagnosis not present

## 2023-08-08 DIAGNOSIS — C3411 Malignant neoplasm of upper lobe, right bronchus or lung: Secondary | ICD-10-CM | POA: Diagnosis not present

## 2023-08-08 DIAGNOSIS — J984 Other disorders of lung: Secondary | ICD-10-CM | POA: Diagnosis not present

## 2023-08-08 DIAGNOSIS — C3412 Malignant neoplasm of upper lobe, left bronchus or lung: Secondary | ICD-10-CM | POA: Insufficient documentation

## 2023-08-08 DIAGNOSIS — R911 Solitary pulmonary nodule: Secondary | ICD-10-CM | POA: Diagnosis not present

## 2023-08-08 DIAGNOSIS — Z79899 Other long term (current) drug therapy: Secondary | ICD-10-CM | POA: Diagnosis not present

## 2023-08-08 DIAGNOSIS — C3490 Malignant neoplasm of unspecified part of unspecified bronchus or lung: Secondary | ICD-10-CM | POA: Diagnosis not present

## 2023-08-08 DIAGNOSIS — R918 Other nonspecific abnormal finding of lung field: Secondary | ICD-10-CM | POA: Diagnosis not present

## 2023-08-08 DIAGNOSIS — Z8551 Personal history of malignant neoplasm of bladder: Secondary | ICD-10-CM | POA: Insufficient documentation

## 2023-08-08 HISTORY — PX: HEMOSTASIS CONTROL: SHX6838

## 2023-08-08 HISTORY — PX: BRONCHIAL NEEDLE ASPIRATION BIOPSY: SHX5106

## 2023-08-08 HISTORY — DX: Nonrheumatic aortic (valve) insufficiency: I35.1

## 2023-08-08 HISTORY — PX: BRONCHIAL BRUSHINGS: SHX5108

## 2023-08-08 HISTORY — PX: VIDEO BRONCHOSCOPY WITH RADIAL ENDOBRONCHIAL ULTRASOUND: SHX6849

## 2023-08-08 HISTORY — PX: BRONCHIAL BIOPSY: SHX5109

## 2023-08-08 LAB — BASIC METABOLIC PANEL
Anion gap: 9 (ref 5–15)
BUN: 16 mg/dL (ref 8–23)
CO2: 23 mmol/L (ref 22–32)
Calcium: 9 mg/dL (ref 8.9–10.3)
Chloride: 106 mmol/L (ref 98–111)
Creatinine, Ser: 1.12 mg/dL (ref 0.61–1.24)
GFR, Estimated: >60 mL/min (ref >60)
Glucose, Bld: 95 mg/dL (ref 70–99)
Potassium: 3.8 mmol/L (ref 3.5–5.1)
Sodium: 138 mmol/L (ref 135–145)

## 2023-08-08 LAB — CBC
HCT: 38.7 % — ABNORMAL LOW (ref 39.0–52.0)
Hemoglobin: 12.7 g/dL — ABNORMAL LOW (ref 13.0–17.0)
MCH: 32.2 pg (ref 26.0–34.0)
MCHC: 32.8 g/dL (ref 30.0–36.0)
MCV: 98 fL (ref 80.0–100.0)
Platelets: 210 10*3/uL (ref 150–400)
RBC: 3.95 MIL/uL — ABNORMAL LOW (ref 4.22–5.81)
RDW: 12.5 % (ref 11.5–15.5)
WBC: 8.9 10*3/uL (ref 4.0–10.5)
nRBC: 0 % (ref 0.0–0.2)

## 2023-08-08 SURGERY — BRONCHOSCOPY, WITH BIOPSY USING ELECTROMAGNETIC NAVIGATION
Anesthesia: General

## 2023-08-08 MED ORDER — SUGAMMADEX SODIUM 200 MG/2ML IV SOLN
INTRAVENOUS | Status: DC | PRN
  Administered 2023-08-08: 137.8 mg via INTRAVENOUS

## 2023-08-08 MED ORDER — PROPOFOL 10 MG/ML IV BOLUS
INTRAVENOUS | Status: DC | PRN
  Administered 2023-08-08: 150 ug/kg/min via INTRAVENOUS
  Administered 2023-08-08: 20 mg via INTRAVENOUS
  Administered 2023-08-08 (×2): 100 mg via INTRAVENOUS

## 2023-08-08 MED ORDER — PHENYLEPHRINE HCL-NACL 20-0.9 MG/250ML-% IV SOLN
INTRAVENOUS | Status: DC | PRN
  Administered 2023-08-08: 20 ug/min via INTRAVENOUS

## 2023-08-08 MED ORDER — SODIUM CHLORIDE (PF) 0.9 % IJ SOLN
PREFILLED_SYRINGE | INTRAMUSCULAR | Status: DC | PRN
  Administered 2023-08-08: 2 mL

## 2023-08-08 MED ORDER — FENTANYL CITRATE (PF) 250 MCG/5ML IJ SOLN
INTRAMUSCULAR | Status: DC | PRN
  Administered 2023-08-08 (×2): 50 ug via INTRAVENOUS

## 2023-08-08 MED ORDER — CHLORHEXIDINE GLUCONATE 0.12 % MT SOLN
OROMUCOSAL | Status: AC
  Administered 2023-08-08: 15 mL via OROMUCOSAL
  Filled 2023-08-08: qty 15

## 2023-08-08 MED ORDER — ROCURONIUM BROMIDE 10 MG/ML (PF) SYRINGE
PREFILLED_SYRINGE | INTRAVENOUS | Status: DC | PRN
  Administered 2023-08-08: 70 mg via INTRAVENOUS

## 2023-08-08 MED ORDER — CHLORHEXIDINE GLUCONATE 0.12 % MT SOLN: 15.0000 mL | Freq: Once | OROMUCOSAL | Status: AC

## 2023-08-08 MED ORDER — LACTATED RINGERS IV SOLN: INTRAVENOUS | Status: DC

## 2023-08-08 MED ORDER — ONDANSETRON HCL 4 MG/2ML IJ SOLN
INTRAMUSCULAR | Status: DC | PRN
  Administered 2023-08-08: 4 mg via INTRAVENOUS

## 2023-08-08 MED ORDER — FENTANYL CITRATE (PF) 100 MCG/2ML IJ SOLN
INTRAMUSCULAR | Status: AC
  Filled 2023-08-08: qty 2

## 2023-08-08 MED ORDER — EPINEPHRINE 1 MG/10ML IJ SOSY
PREFILLED_SYRINGE | INTRAMUSCULAR | Status: AC
  Filled 2023-08-08: qty 10

## 2023-08-08 MED ORDER — LIDOCAINE 2% (20 MG/ML) 5 ML SYRINGE
INTRAMUSCULAR | Status: DC | PRN
  Administered 2023-08-08: 60 mg via INTRAVENOUS

## 2023-08-08 MED ORDER — ACETAMINOPHEN 500 MG PO TABS
1000.0000 mg | ORAL_TABLET | Freq: Once | ORAL | Status: AC
  Administered 2023-08-08: 1000 mg via ORAL
  Filled 2023-08-08: qty 2

## 2023-08-08 NOTE — Anesthesia Procedure Notes (Signed)
Procedure Name: Intubation Date/Time: 08/08/2023 11:13 AM  Performed by: Lendon Ka, CRNAPre-anesthesia Checklist: Patient identified, Emergency Drugs available, Suction available and Patient being monitored Patient Re-evaluated:Patient Re-evaluated prior to induction Oxygen Delivery Method: Circle System Utilized Preoxygenation: Pre-oxygenation with 100% oxygen Induction Type: IV induction Ventilation: Mask ventilation without difficulty Laryngoscope Size: Glidescope and 3 Grade View: Grade I Tube type: Oral Tube size: 8.5 mm Number of attempts: 3 Airway Equipment and Method: Stylet Placement Confirmation: ETT inserted through vocal cords under direct vision, positive ETCO2 and breath sounds checked- equal and bilateral Secured at: 24 cm Tube secured with: Tape Dental Injury: Teeth and Oropharynx as per pre-operative assessment  Comments: MAC 4 & Miller 2 = Iib view with difficulty passing 8.5 ETT. GS #3 with ease.

## 2023-08-08 NOTE — Op Note (Signed)
Video Bronchoscopy with Robotic Assisted Bronchoscopic Navigation   Date of Operation: 08/08/2023   Pre-op Diagnosis: Left upper lobe mass, right upper lobe nodule  Post-op Diagnosis: Same  Surgeon: Levy Pupa  Assistants: None  Anesthesia: General endotracheal anesthesia  Operation: Flexible video fiberoptic bronchoscopy with robotic assistance and biopsies.  Estimated Blood Loss: Minimal  Complications: None  Indications and History: John [name] is a 79 y.o. male with history of prostate cancer, bladder cancer and former tobacco use.  He was found to have a left upper lobe perihilar mass and a right upper lobe pulmonary nodule on CT scan of the chest.  Recommendation made to achieve a tissue diagnosis via robotic assisted navigational bronchoscopy.  The risks, benefits, complications, treatment options and expected outcomes were discussed with the patient.  The possibilities of pneumothorax, pneumonia, reaction to medication, pulmonary aspiration, perforation of a viscus, bleeding, failure to diagnose a condition and creating a complication requiring transfusion or operation were discussed with the patient who freely signed the consent.    Description of Procedure: The patient was seen in the Preoperative Area, was examined and was deemed appropriate to proceed.  The patient was taken to [facility] endoscopy room 3, identified as John [name] and the procedure verified as Flexible Video Fiberoptic Bronchoscopy.  A Time Out was held and the above information confirmed.   Prior to the date of the procedure a high-resolution CT scan of the chest was performed. Utilizing ION software program a virtual tracheobronchial tree was generated to allow the creation of distinct navigation pathways to the patient's parenchymal abnormalities. After being taken to the operating room general anesthesia was initiated and the patient  was orally intubated. The video fiberoptic bronchoscope was introduced via the  endotracheal tube and a general inspection was performed which showed normal right lung anatomy. The left upper lobe airway was irregular, hypervascular and occluded with an endobronchial lesion that extended distally.  The standard bronchoscope would not pass this obstruction, nor with the robotic scope later in the case.  The lingular airways and the left lower lobe airways were all open.  Normal.  Aspiration of the bilateral mainstems was completed to remove any remaining secretions. Robotic catheter inserted into patient's endotracheal tube.   Target #1 right upper lobe nodule: The distinct navigation pathways prepared prior to this procedure were then utilized to navigate to patient's lesion identified on CT scan. The robotic catheter was secured into place and the vision probe was withdrawn.  Lesion location was approximated using fluoroscopy and radial endobronchial ultrasound for peripheral targeting.  Local registration and targeting was performed using Cios three-dimensional imaging.  Under fluoroscopic guidance transbronchial needle brushings, transbronchial needle biopsies, and transbronchial forceps biopsies were performed to be sent for cytology and pathology.   The standard scope was then reintroduced and left lower lobe endobronchial lesion was better characterized.  This was hypervascular and 2 cc of 1:10,000 dilution epinephrine was injected onto the lesion.  Instruments would pass into the narrowed left upper lobe airway so multiple biopsies were taken, some transbronchial forceps biopsies under fluoroscopic guidance and some endobronchial forceps biopsies.  These were all pooled. Under fluoroscopic guidance transbronchial brushings were also performed for cytology.   At the end of the procedure a general airway inspection was performed and there was no evidence of active bleeding. The bronchoscope was removed.  The patient tolerated the procedure well. There was no significant blood loss  and there were no obvious complications. A post-procedural chest x-ray  is pending.  Samples Target #1: 1. Transbronchial needle brushings from right upper lobe nodule 2. Transbronchial Wang needle biopsies from right upper lobe nodule 3. Transbronchial forceps biopsies from right upper lobe nodule  Other Samples:  4.  Transbronchial and endobronchial forceps biopsies from left upper lobe 5.  Transbronchial brushings from left upper lobe  Plans:  The patient will be discharged from the PACU to home when recovered from anesthesia and after chest x-ray is reviewed. We will review the cytology, pathology and microbiology results with the patient when they become available. Outpatient followup will be with Saralyn Pilar, NP.   Levy Pupa, MD, PhD 08/08/2023, 12:24 PM [facility] [phone] or if no answer before 7:00PM call [phone] For any issues after 7:00PM please call eLink [phone]

## 2023-08-08 NOTE — Discharge Instructions (Signed)
Flexible Bronchoscopy, Care After This sheet gives you information about how to care for yourself after your test. Your doctor may also give you more specific instructions. If you have problems or questions, contact your doctor. Follow these instructions at home: Eating and drinking When your numbness is gone and your cough and gag reflexes have come back, you may: Eat only soft foods. Slowly drink liquids. When you get home after the test, go back to your normal diet. Driving Do not drive for 24 hours if you were given a medicine to help you relax (sedative). Do not drive or use heavy machinery while taking prescription pain medicine. General instructions  Take over-the-counter and prescription medicines only as told by your doctor. Return to your normal activities as told. Ask what activities are safe for you. Do not use any products that have nicotine or tobacco in them. This includes cigarettes and e-cigarettes. If you need help quitting, ask your doctor. Keep all follow-up visits as told by your doctor. This is important. It is very important if you had a tissue sample (biopsy) taken. Get help right away if: You have shortness of breath that gets worse. You get light-headed. You feel like you are going to pass out (faint). You have chest pain. You cough up: More than a little blood. More blood than before. Summary Do not eat or drink anything (not even water) for 2 hours after your test, or until your numbing medicine wears off. Do not use cigarettes. Do not use e-cigarettes. Get help right away if you have chest pain.  Please call our office for any questions or concerns.  [phone].  This information is not intended to replace advice given to you by your health care provider. Make sure you discuss any questions you have with your health care provider. Document Released: 04/25/2009 Document Revised: 06/10/2017 Document Reviewed: 07/16/2016 Elsevier Patient Education  2020  [employer].

## 2023-08-08 NOTE — Interval H&P Note (Signed)
History and Physical Interval Note:  08/08/2023 10:58 AM  John [name]  has presented today for surgery, with the diagnosis of left hilar mass, right upper lobe bilateral.  The various methods of treatment have been discussed with the patient and family. After consideration of risks, benefits and other options for treatment, the patient has consented to  Procedure(s): ROBOTIC ASSISTED NAVIGATIONAL BRONCHOSCOPY (N/A) as a surgical intervention.  The patient's history has been reviewed, patient examined, no change in status, stable for surgery.  I have reviewed the patient's chart and labs.  Questions were answered to the patient's satisfaction.     Leslye Peer

## 2023-08-08 NOTE — Transfer of Care (Signed)
Immediate Anesthesia Transfer of Care Note  Patient: John [name]  Procedure(s) Performed: ROBOTIC ASSISTED NAVIGATIONAL BRONCHOSCOPY BRONCHIAL BRUSHINGS BRONCHIAL NEEDLE ASPIRATION BIOPSIES BRONCHIAL BIOPSIES VIDEO BRONCHOSCOPY WITH RADIAL ENDOBRONCHIAL ULTRASOUND HEMOSTASIS CONTROL  Patient Location: Endoscopy Unit  Anesthesia Type:General  Level of Consciousness: awake, alert , and oriented  Airway & Oxygen Therapy: Patient Spontanous Breathing and Patient connected to face mask oxygen  Post-op Assessment: Report given to RN and Post -op Vital signs reviewed and stable  Post vital signs: Reviewed and stable  Last Vitals:  Vitals Value Taken Time  BP 138/56 08/08/23 1230  Temp    Pulse 81 08/08/23 1236  Resp 20 08/08/23 1236  SpO2 95 % 08/08/23 1236  Vitals shown include unfiled device data.  Last Pain:  Vitals:   08/08/23 0806  TempSrc:   PainSc: 0-No pain         Complications: No notable events documented.

## 2023-08-09 NOTE — Anesthesia Postprocedure Evaluation (Signed)
Anesthesia Post Note  Patient: MATTY VANROEKEL  Procedure(s) Performed: ROBOTIC ASSISTED NAVIGATIONAL BRONCHOSCOPY BRONCHIAL BRUSHINGS BRONCHIAL NEEDLE ASPIRATION BIOPSIES BRONCHIAL BIOPSIES VIDEO BRONCHOSCOPY WITH RADIAL ENDOBRONCHIAL ULTRASOUND HEMOSTASIS CONTROL     Patient location during evaluation: PACU Anesthesia Type: General Level of consciousness: awake and alert, oriented and patient cooperative Pain management: pain level controlled Vital Signs Assessment: post-procedure vital signs reviewed and stable Respiratory status: spontaneous breathing, nonlabored ventilation and respiratory function stable Cardiovascular status: blood pressure returned to baseline and stable Postop Assessment: no apparent nausea or vomiting Anesthetic complications: no   No notable events documented.  Last Vitals:  Vitals:   08/08/23 1320 08/08/23 1330  BP: (!) 120/50 (!) 117/55  Pulse: 71 69  Resp: (!) 23 (!) 22  Temp:    SpO2: (!) 88% 92%    Last Pain:  Vitals:   08/08/23 1330  TempSrc:   PainSc: 0-No pain                 Lannie Fields

## 2023-08-10 ENCOUNTER — Telehealth: Payer: Self-pay | Admitting: Emergency Medicine

## 2023-08-10 ENCOUNTER — Encounter (HOSPITAL_COMMUNITY): Payer: Self-pay | Admitting: Emergency Medicine

## 2023-08-10 LAB — CYTOLOGY - NON PAP

## 2023-08-10 NOTE — Telephone Encounter (Signed)
Routing to Dr Delton Coombes and secure chat sent to him to make him aware

## 2023-08-10 NOTE — Telephone Encounter (Signed)
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

## 2023-08-10 NOTE — Telephone Encounter (Signed)
He has f/u to discuss and determine next steps w SG next week  FYI to SG

## 2023-08-13 DIAGNOSIS — C3490 Malignant neoplasm of unspecified part of unspecified bronchus or lung: Secondary | ICD-10-CM | POA: Diagnosis not present

## 2023-08-13 HISTORY — DX: Malignant neoplasm of unspecified part of unspecified bronchus or lung: C34.90

## 2023-08-16 ENCOUNTER — Ambulatory Visit (INDEPENDENT_AMBULATORY_CARE_PROVIDER_SITE_OTHER): Payer: 59 | Admitting: Acute Care

## 2023-08-16 ENCOUNTER — Encounter: Payer: Self-pay | Admitting: Acute Care

## 2023-08-16 ENCOUNTER — Telehealth: Payer: Self-pay

## 2023-08-16 VITALS — BP 120/84 | HR 78 | Ht 72.0 in | Wt 148.0 lb

## 2023-08-16 DIAGNOSIS — R0600 Dyspnea, unspecified: Secondary | ICD-10-CM

## 2023-08-16 DIAGNOSIS — C3490 Malignant neoplasm of unspecified part of unspecified bronchus or lung: Secondary | ICD-10-CM | POA: Diagnosis not present

## 2023-08-16 DIAGNOSIS — Z8546 Personal history of malignant neoplasm of prostate: Secondary | ICD-10-CM

## 2023-08-16 DIAGNOSIS — Z87891 Personal history of nicotine dependence: Secondary | ICD-10-CM

## 2023-08-16 DIAGNOSIS — Z9889 Other specified postprocedural states: Secondary | ICD-10-CM

## 2023-08-16 MED ORDER — ALBUTEROL SULFATE HFA 108 (90 BASE) MCG/ACT IN AERS: 1.0000 | INHALATION_SPRAY | Freq: Four times a day (QID) | RESPIRATORY_TRACT | 2 refills | Status: DC | PRN

## 2023-08-16 NOTE — Telephone Encounter (Signed)
2/4 @ 2:25 pm Called both patient and patient's sister contact numbers, spoke to pt's sister left message for patient to call our office to be schedule for consult.

## 2023-08-16 NOTE — Patient Instructions (Addendum)
 It is good to see you today. I am glad you have done well after your biopsy. Your biopsies were positive for cancer. 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  I have referred you to both medical oncology and radiation oncology.  You will get calls to get these scheduled.  I have ordered both a PET scan and an MRI Brain to complete your staging. You will also get calls to get these scheduled.  You will get great care at the cancer center. Good luck with your treatment. I have prescribed an inhaler for your shortness of breath. I am glad you have done well after your biopsy. Your biopsies were positive for cancer. 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  I have referred you to both medical oncology and radiation oncology.  You will get calls to get these scheduled.  I have ordered both a PET scan and an MRI Brain to complete your staging. You will also get calls to get these scheduled.  You will get great care at the cancer center. Good luck with your treatment. I have prescribed an inhaler for your shortness of breath. Call if you need us . Please contact office for sooner follow up if symptoms do not improve or worsen or seek emergency care

## 2023-08-16 NOTE — Progress Notes (Signed)
 History of Present Illness John [name] is a 78 y.o. male  former smoker , quit 10  years ago.with a 24 pack smoking history with history of allergies and recurrent bladder cancer.  Referred to Dr. Shelah 07/2023 when he had a CTA with findings concerning for  synchronous primary malignancy or metastatic disease.  Synopsis Former smoker referred for abnormal chest imaging. Imaging shows what appears to be Progressive stage IV lung cancer as evidenced by and enlarging AP window mass and enlarging spiculated right upper lobe nodule.He is being referred for consideration of bronchoscopy with biopsies.  He has a  history of superficial transitional cell carcinoma the bladder and was found to have recurrent tumor on  surveillance in 2022. He had cystoscopy, and  had cystoscopy and TURBT and retrogrades August 2022. Recent CTA done to further evaluate an enlarged pulmonary artery now shows  pulmonary nodules concerning for synchronous primary or metastatic disease.   Pt has smoked 10-12 cigarettes per day x 49 years, he has  developed a new cough over  the past 2 months. The cough is not productive.  CTA was done to evaluate a enlarged  pulmonary artery. CTA was positive for a large hilar mass which is causing narrowing of the left upper lobe pulmonary artery and superior left pulmonary vein.  There is also notation of an enlarged right hilar lymph node measuring 10 mm.  Chest imaging also revealed complete occlusion of the apicoposterior and anterior left upper lobe bronchi with distal mucous plugging ,  centrilobular nodules of the central left upper lobe likely postobstructive.  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      He is divorced, lived in [place] and worked in a therapist, sports. He is unsure if he was exposed to asbestos. He has done commercial maintenance,  cleaning with chemicals, he feels he has had ammonia exposures etc.  He has no children, but a niece who is a great support.  He was seen by me 08/03/2023 ,and scheduled for bronchoscopy with biopsies.    08/16/2023 Pt. Presents for follow up after bronch with biopsies.He is here with his Niece and her husband. He states he has done well since the procedure.  No issues, some bleeding which has self resolved, no fever, discolored secretions or worsening shortness of breath.  We have discussed his cytology. 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 We discussed that this could be 2 new primaries vs one mets from the prostate with a new primary lung small cell. We discussed that I will refer him to both radiation oncology and  medical oncology for treatment. He has not yet undergone a PET scan or MRI of the brain,  for staging lung cancer.I have ordered both.  Both he and his niece verbalized understanding of the diagnosis and plan for treatment.  Test Results: Cytology 08/08/2023 A. LUNG, RUL, FINE NEEDLE ASPIRATION:  -Adenocarcinoma   Note: Immunohistochemical stains are positive for CK7 and TTF-1 and are  negative for CD56, synaptophysin and p40.  Dr. Reed has  peer-reviewed the case and agrees with the interpretation.  Dr. Shelah  was informed of the diagnosis on 08/10/2023.   B. LUNG, RUL, BRUSHING:  - Rare atypical cells present, see part A     FINAL  MICROSCOPIC DIAGNOSIS:  C. LUNG, LUL, BRUSHING:  - Malignant cells present, see part D   D. LUNG, LUL, BIOPSY:  -Small cell carcinoma   Note: Immunohistochemical stains are positive for TTF-1, synaptophysin  and CD56 and are negative for CK7 and p40 immunophenotypically  consistent with small cell carcinoma.  Dr. Reed has peer-reviewed  the case and agrees with the interpretation.  Dr. Shelah was informed of  the diagnosis on 08/10/2023.      Latest  Ref Rng & Units 08/08/2023    7:35 AM 02/17/2021    9:21 AM 05/28/2016    8:03 AM  CBC  WBC 4.0 - 10.5 K/uL 8.9     Hemoglobin 13.0 - 17.0 g/dL 87.2  84.9  84.9   Hematocrit 39.0 - 52.0 % 38.7  44.0    Platelets 150 - 400 K/uL 210          Latest Ref Rng & Units 08/08/2023    7:35 AM 02/17/2021    9:21 AM 07/25/2012   10:23 AM  BMP  Glucose 70 - 99 mg/dL 95  96  871   BUN 8 - 23 mg/dL 16  10  9    Creatinine 0.61 - 1.24 mg/dL 8.87  8.99  8.94   Sodium 135 - 145 mmol/L 138  145  142   Potassium 3.5 - 5.1 mmol/L 3.8  3.6  3.6   Chloride 98 - 111 mmol/L 106  108  105   CO2 22 - 32 mmol/L 23   29   Calcium 8.9 - 10.3 mg/dL 9.0   89.9     BNP No results found for: BNP  ProBNP No results found for: PROBNP  PFT No results found for: FEV1PRE, FEV1POST, FVCPRE, FVCPOST, TLC, DLCOUNC, PREFEV1FVCRT, PSTFEV1FVCRT  CT Super D Chest Wo Contrast Result Date: 08/10/2023 CLINICAL DATA:  Shortness of breath. Bladder and prostate cancer, lung mass. * Tracking Code: BO * EXAM: CT CHEST WITHOUT CONTRAST TECHNIQUE: Multidetector CT imaging of the chest was performed using thin slice collimation for electromagnetic bronchoscopy planning purposes, without intravenous contrast. RADIATION DOSE REDUCTION: This exam was performed according to the departmental dose-optimization program which includes automated exposure control, adjustment of the mA and/or kV according to patient size and/or use of iterative reconstruction technique. COMPARISON:  05/05/2023. FINDINGS: Cardiovascular: Atherosclerotic calcification of the aorta, aortic valve and coronary arteries. Heart is at the upper limits of normal in size to mildly enlarged. No pericardial effusion. Enlarged pulmonic trunk. Mediastinum/Nodes: Enlarging AP window mass, measuring approximately 6.7 x 9.2 cm, previously 6.1 x 7.4 cm. Hilar regions are difficult to definitively evaluate without IV contrast. No axillary adenopathy. Air in the  esophagus can be seen with dysmotility. Lungs/Pleura: Centrilobular and paraseptal emphysema. Spiculated nodule in the posterior segment right upper lobe measures 2.3 x 2.6 cm (5/65), enlarged from 1.8 x 2.4 cm on 05/05/2023. Peripheral and basilar predominant subpleural ground-glass, traction bronchiectasis/bronchiolectasis and subpleural reticular densities. Obstruction of the left upper lobe bronchus with narrowing of the lingular bronchus. No pleural fluid. Debris in the airway. Upper Abdomen: Visualized portions of the liver, gallbladder, adrenal glands, kidneys, spleen, pancreas, stomach and bowel are grossly unremarkable. No upper abdominal adenopathy. Musculoskeletal: Degenerative changes in the spine. IMPRESSION: 1. Progressive stage IV lung cancer as evidenced by and enlarging AP window mass and enlarging spiculated right upper lobe nodule. 2. 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 3. Aortic atherosclerosis (ICD10-I70.0). Coronary artery calcification. 4. Enlarged pulmonic trunk, indicative of pulmonary arterial hypertension. 5.  Emphysema (ICD10-J43.9). Electronically Signed   By: Newell Eke M.D.   On: 08/10/2023 14:26   DG Chest Port 1 View Result Date: 08/08/2023 CLINICAL DATA:  Status post bronchoscopy with biopsy. EXAM: PORTABLE CHEST 1 VIEW COMPARISON:  Chest CT 08/03/2023 FINDINGS: Fullness in left mediastinum compatible with known mass. Patchy densities in right suprahilar region also compatible with known lesion on prior CT. No evidence for pneumothorax. Heart size is stable. IMPRESSION: 1. No evidence for pneumothorax. 2. Known chest lesions. Electronically Signed   By: Juliene Balder M.D.   On: 08/08/2023 14:14   DG C-ARM BRONCHOSCOPY Result Date: 08/08/2023 C-ARM BRONCHOSCOPY: Fluoroscopy was utilized by the requesting physician.  No radiographic interpretation.     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00    Average packs/day: 0.5 packs/day for 42.0 years (21.0 ttl pk-yrs)    Types: Cigarettes    Start date: 10/28/1972    Quit date: 10/29/2014    Years since quitting: 8.8   Smokeless tobacco: Never  Vaping Use   Vaping status: Never Used  Substance Use Topics   Alcohol use: Yes    Comment: occasionally blended whiskey    Drug use: Yes    Types: Marijuana    Mr.Herber reports that he quit smoking about 8 years ago. His smoking use included cigarettes. He started smoking about 50 years ago. He has a 21 pack-year smoking history. He has never used smokeless tobacco. He reports current alcohol use. He reports current drug use. Drug: Marijuana.  Tobacco Cessation: Former smoker , Quit 2017 with a 21 pack year smoking history.   Past surgical hx, Family hx, Social hx all reviewed.  Current Outpatient Medications on File Prior to Visit  Medication Sig   amLODipine (NORVASC) 10 MG tablet Take 10 mg by mouth daily.   No current facility-administered medications on file prior to visit.     No Known Allergies  Review Of Systems:  Constitutional:   No  weight loss, night sweats,  Fevers, chills, fatigue, or   lassitude.  HEENT:   No headaches,  Difficulty swallowing,  Tooth/dental problems, or  Sore throat,                No sneezing, itching, ear ache, nasal congestion, post nasal drip, + Hoarse voice  CV:  No chest pain,  Orthopnea, PND, swelling in lower extremities, anasarca, dizziness, palpitations, syncope.   GI  No heartburn, indigestion, abdominal pain, nausea, vomiting, diarrhea, change in bowel habits, loss of appetite, bloody stools.   Resp: No  shortness of breath with exertion or at rest.  No excess mucus, no productive cough,  No non-productive cough,  No coughing up of blood.  No change in color of mucus.  No wheezing.  No chest wall deformity  Skin: no rash or lesions.  GU: no dysuria, change in color of urine, no urgency or frequency.  No flank pain, no hematuria   MS:  No joint pain or swelling.  No decreased range of motion.  No back pain.  Psych:  No change in mood or affect. No depression or anxiety.  No memory loss.   Vital Signs BP 120/84 (BP Location: Right Arm, Cuff Size: Normal)   Pulse 78   Ht 6' (1.829 m)   Wt 148 lb (67.1 kg)   SpO2 99%   BMI 20.07 kg/m    Physical Exam:  General- No distress,  A&Ox3, pleasant ENT: No sinus tenderness, TM clear, pale nasal mucosa, no oral exudate,no post nasal drip, no LAN Cardiac: S1, S2, regular rate and rhythm, no murmur Chest: No wheeze/ rales/ dullness; no accessory muscle use, no nasal flaring, no sternal retractions Abd.: Soft Non-tender, ND, BS +, Body mass index is 20.07 kg/m.  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 Discussed the need for further staging and potential treatment options including radiation, immunotherapy, targeted therapy, and  surgery. I am glad you have done well after your biopsy. Your biopsies were positive for cancer. 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  I have referred you to both medical oncology and radiation oncology.  You will get calls to get these scheduled.  I have ordered both a PET scan and an MRI Brain to complete your staging. You will also get calls to get these scheduled.  You will get great care at the cancer center. Good luck with your treatment.   Shortness of Breath with exertion Reports shortness of breath with exertion, particularly on inclines. No change post-procedure. No current inhaler use. -Prescribe Albuterol  inhaler for use as needed for shortness of breath. I have prescribed an inhaler for your shortness of breath. Use for shortness of breath or wheezing as needed.  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 No current issues reported. -Continue to monitor  I spent 40 minutes dedicated to the care of this patient on the date of this encounter to include pre-visit review of records, face-to-face time with the patient discussing conditions above, post visit ordering of testing, clinical documentation with the electronic health record, making appropriate referrals as documented, and communicating necessary information to the patient's healthcare team.    Lauraine JULIANNA Lites, NP 08/16/2023  1:32 PM

## 2023-08-17 NOTE — Progress Notes (Addendum)
 Thoracic Location of Tumor / Histology: Right Upper Lobe, AP window mass, Left Upper Lobe   Patient presented following CTA that noted concerns for synchronous primary malignancy or metastatic disease.   MRI Brain 08/19/2023:  PET 08/25/2023:   Biopsies of RUL, LUL 08/08/2023     Past/Anticipated interventions by cardiothoracic surgery, if any:  Lauraine Lites 08/16/2023 -He has a history of superficial transitional cell carcinoma the bladder and was found to have recurrent tumor on surveillance in 2022. He had cystoscopy, and had cystoscopy and TURBT and retrogrades August 2022. Recent CTA done to further evaluate an enlarged pulmonary artery now shows pulmonary nodules concerning for synchronous primary or metastatic disease.  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  -I have referred you to both medical oncology and radiation oncology.    Past/Anticipated interventions by cardiothoracic surgery, if any:    Past/Anticipated interventions by medical oncology, if any:     Tobacco/Marijuana/Snuff/ETOH use: Former Smoker, quit 10 years ago  Signs/Symptoms Weight changes, if any: He states he lost about 5 pounds and has gained one back. Respiratory complaints, if any: He reports the SOB is getting more frequent especially when walking on a incline.  He is walking 5 miles daily. Hemoptysis, if any: No Pain issues, if any:  Denies chest pain, pressure or tightness.  SAFETY ISSUES: Prior radiation? Prostate seeds 2013 Pacemaker/ICD? No  Possible current pregnancy? N/a Is the patient on methotrexate? No  Current Complaints / other details:

## 2023-08-18 ENCOUNTER — Other Ambulatory Visit: Payer: Self-pay

## 2023-08-18 ENCOUNTER — Ambulatory Visit
Admission: RE | Admit: 2023-08-18 | Discharge: 2023-08-18 | Disposition: A | Discharge status: Home / Self Care | Payer: 59 | Source: Ambulatory Visit | Attending: Radiation Oncology | Admitting: Radiation Oncology

## 2023-08-18 ENCOUNTER — Encounter: Payer: Self-pay | Admitting: Radiation Oncology

## 2023-08-18 VITALS — BP 120/60 | HR 76 | Temp 96.9°F | Resp 18 | Ht 72.0 in | Wt 148.1 lb

## 2023-08-18 DIAGNOSIS — Z8551 Personal history of malignant neoplasm of bladder: Secondary | ICD-10-CM | POA: Diagnosis not present

## 2023-08-18 DIAGNOSIS — C3411 Malignant neoplasm of upper lobe, right bronchus or lung: Secondary | ICD-10-CM | POA: Diagnosis not present

## 2023-08-18 DIAGNOSIS — Z79899 Other long term (current) drug therapy: Secondary | ICD-10-CM | POA: Insufficient documentation

## 2023-08-18 DIAGNOSIS — C3412 Malignant neoplasm of upper lobe, left bronchus or lung: Secondary | ICD-10-CM | POA: Diagnosis not present

## 2023-08-18 DIAGNOSIS — Z923 Personal history of irradiation: Secondary | ICD-10-CM | POA: Insufficient documentation

## 2023-08-18 DIAGNOSIS — Z87891 Personal history of nicotine dependence: Secondary | ICD-10-CM | POA: Insufficient documentation

## 2023-08-18 DIAGNOSIS — Z8546 Personal history of malignant neoplasm of prostate: Secondary | ICD-10-CM | POA: Diagnosis not present

## 2023-08-18 DIAGNOSIS — J432 Centrilobular emphysema: Secondary | ICD-10-CM | POA: Insufficient documentation

## 2023-08-18 DIAGNOSIS — I10 Essential (primary) hypertension: Secondary | ICD-10-CM | POA: Insufficient documentation

## 2023-08-18 DIAGNOSIS — I700 Atherosclerosis of aorta: Secondary | ICD-10-CM | POA: Diagnosis not present

## 2023-08-19 ENCOUNTER — Encounter (HOSPITAL_COMMUNITY): Payer: Self-pay

## 2023-08-19 ENCOUNTER — Ambulatory Visit
Admission: RE | Admit: 2023-08-19 | Discharge: 2023-08-19 | Disposition: A | Discharge status: Home / Self Care | Payer: 59 | Source: Ambulatory Visit | Attending: Radiation Oncology | Admitting: Radiation Oncology

## 2023-08-19 ENCOUNTER — Ambulatory Visit (HOSPITAL_COMMUNITY)
Admission: RE | Admit: 2023-08-19 | Discharge: 2023-08-19 | Disposition: A | Discharge status: Home / Self Care | Payer: 59 | Source: Ambulatory Visit | Attending: Acute Care | Admitting: Acute Care

## 2023-08-19 DIAGNOSIS — Z79630 Long term (current) use of alkylating agent: Secondary | ICD-10-CM | POA: Diagnosis not present

## 2023-08-19 DIAGNOSIS — G319 Degenerative disease of nervous system, unspecified: Secondary | ICD-10-CM | POA: Diagnosis not present

## 2023-08-19 DIAGNOSIS — Z79620 Long term (current) use of immunosuppressive biologic: Secondary | ICD-10-CM | POA: Insufficient documentation

## 2023-08-19 DIAGNOSIS — C3490 Malignant neoplasm of unspecified part of unspecified bronchus or lung: Secondary | ICD-10-CM | POA: Insufficient documentation

## 2023-08-19 DIAGNOSIS — C61 Malignant neoplasm of prostate: Secondary | ICD-10-CM | POA: Diagnosis not present

## 2023-08-19 DIAGNOSIS — C3411 Malignant neoplasm of upper lobe, right bronchus or lung: Secondary | ICD-10-CM | POA: Diagnosis not present

## 2023-08-19 DIAGNOSIS — I6782 Cerebral ischemia: Secondary | ICD-10-CM | POA: Diagnosis not present

## 2023-08-19 DIAGNOSIS — C3412 Malignant neoplasm of upper lobe, left bronchus or lung: Secondary | ICD-10-CM | POA: Diagnosis not present

## 2023-08-19 DIAGNOSIS — Z5111 Encounter for antineoplastic chemotherapy: Secondary | ICD-10-CM | POA: Diagnosis not present

## 2023-08-19 DIAGNOSIS — Z5112 Encounter for antineoplastic immunotherapy: Secondary | ICD-10-CM | POA: Insufficient documentation

## 2023-08-19 DIAGNOSIS — Z87891 Personal history of nicotine dependence: Secondary | ICD-10-CM | POA: Insufficient documentation

## 2023-08-19 DIAGNOSIS — Z79634 Long term (current) use of topoisomerase inhibitor: Secondary | ICD-10-CM | POA: Diagnosis not present

## 2023-08-19 DIAGNOSIS — Z8546 Personal history of malignant neoplasm of prostate: Secondary | ICD-10-CM | POA: Diagnosis not present

## 2023-08-19 DIAGNOSIS — J329 Chronic sinusitis, unspecified: Secondary | ICD-10-CM | POA: Diagnosis not present

## 2023-08-19 DIAGNOSIS — R918 Other nonspecific abnormal finding of lung field: Secondary | ICD-10-CM | POA: Diagnosis not present

## 2023-08-19 DIAGNOSIS — Z510 Encounter for antineoplastic radiation therapy: Secondary | ICD-10-CM | POA: Insufficient documentation

## 2023-08-19 DIAGNOSIS — Z8551 Personal history of malignant neoplasm of bladder: Secondary | ICD-10-CM | POA: Diagnosis not present

## 2023-08-19 LAB — GLUCOSE, CAPILLARY: Glucose-Capillary: 113 mg/dL — ABNORMAL HIGH (ref 70–99)

## 2023-08-19 MED ORDER — FLUDEOXYGLUCOSE F - 18 (FDG) INJECTION
7.4000 | Freq: Once | INTRAVENOUS | Status: DC
Start: 2023-08-19 — End: 2023-08-25

## 2023-08-19 MED ORDER — GADOBUTROL 1 MMOL/ML IV SOLN
6.0000 mL | Freq: Once | INTRAVENOUS | Status: AC | PRN
  Administered 2023-08-19: 6 mL via INTRAVENOUS

## 2023-08-19 NOTE — Progress Notes (Signed)
 I called the pt to introduce myself and explain my role as his nurse navigator. I let him know I received his referral and Dr Sherrod would like to see him next week. Pt verbalized understanding. Pt will come in on 2/11 at 1:45 for labs and 2:15 to see dr sherrod. Pt is aware of the location of the cancer center and has transportation in place. I provided my desk phone number to him in the event he has any questions or concerns.  I was able to reschedule the pts brain MRI for 2/7 at 7pm. Pt is aware of the date and time. He understands he needs to report to [facility] radiology at 6:30pm and enter through the ER. He is to let the ER know that he has an appt at radiology for an outpt appt. Pt verbalized understanding of information provided.

## 2023-08-19 NOTE — Progress Notes (Signed)
 The proposed treatment discussed in conference is for discussion purpose only and is not a binding recommendation.  The patients have not been physically examined, or presented with their treatment options.  Therefore, final treatment plans cannot be decided.

## 2023-08-21 DIAGNOSIS — C3412 Malignant neoplasm of upper lobe, left bronchus or lung: Secondary | ICD-10-CM | POA: Insufficient documentation

## 2023-08-21 DIAGNOSIS — C3411 Malignant neoplasm of upper lobe, right bronchus or lung: Secondary | ICD-10-CM | POA: Insufficient documentation

## 2023-08-21 NOTE — Progress Notes (Signed)
 Radiation Oncology         [phone] ________________________________  Name: John [name]        MRN: [id number]  Date of Service: 08/18/2023 DOB: 29-Sep-1945  RR:[facility], [facility], Lamar RAMAN, MD     REFERRING PHYSICIAN: Shelah Lamar RAMAN, MD   DIAGNOSIS: The primary encounter diagnosis was Malignant neoplasm of right upper lobe of lung (HCC). A diagnosis of Malignant neoplasm of upper lobe of left lung (HCC) was also pertinent to this visit.   HISTORY OF PRESENT ILLNESS: John [name] is a 78 y.o. male seen at the request of Dr. 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  He had a CTA to follow-up on an enlarged pulmonary artery, and this was performed on 05/05/2023.  There was at that time concern for a large left hilar mass measuring 7.4 cm causing severe narrowing of the left upper lobe pulmonary artery and superior left pulmonary vein with an enlarged right hilar lymph node measuring 10 mm.  In addition there was complete occlusion of the apical posterior and anterior left upper lobe bronchi and centrilobular nodules in the left upper lobe.  There was also a spiculated nodule in the right upper lobe measuring 2.4 cm.  He was sent for evaluation with pulmonary medicine, and CT super D scan showed an increase in the mass in the AP window measuring up to 9.2 cm, and the right upper lobe mass measuring up to 2.6 cm.  Ultimately he underwent a bronchoscopy on 08/08/2023.  Final cytology results showed adenocarcinoma on FNA involving the right upper lobe and atypical cells in the brushing.  In addition the left upper lobe specimen including brushing and biopsy showed small cell carcinoma.  The  patient is scheduled to undergo a PET scan on 08/19/2023.  He is also scheduled for brain MRI.  He is seen today to discuss treatment recommendations for his cancer.  He has not yet met with medical oncology.     PREVIOUS RADIATION THERAPY: Yes   08/01/2012 Site/dose: Prostate 14,500 cGy, isotope I-125 utilizing 73 seeds and 24 needles. Individual seed activity 0.43 mCi per seed for a total implant activity of 31.9 mCi with Dr. 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   worked up with cardiology dr hochrein and f/u prn   Nocturia 02/11/2021   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   Wears glasses 02/11/2021       PAST SURGICAL HISTORY: Past Surgical History:  Procedure Laterality Date  ABDOMINAL HERNIA REPAIR  1990's   BRONCHIAL BIOPSY  08/08/2023   Procedure: BRONCHIAL BIOPSIES;  Surgeon: Shelah Lamar RAMAN, MD;  Location: [facility] ENDOSCOPY;  Service: Pulmonary;;   BRONCHIAL BRUSHINGS  08/08/2023   Procedure: BRONCHIAL BRUSHINGS;  Surgeon: Shelah Lamar RAMAN, MD;  Location: [facility] ENDOSCOPY;  Service: Pulmonary;;   BRONCHIAL NEEDLE ASPIRATION BIOPSY  08/08/2023   Procedure: BRONCHIAL NEEDLE ASPIRATION BIOPSIES;  Surgeon: Shelah Lamar RAMAN, MD;  Location: [facility] ENDOSCOPY;  Service: Pulmonary;;   CYSTOSCOPY W/ RETROGRADES N/A 11/03/2015   Procedure: CYSTOSCOPY WITH RETROGRADE PYELOGRAM;  Surgeon:  Belvie LITTIE Clara, MD;  Location: [facility];  Service: Urology;  Laterality: N/A;   CYSTOSCOPY W/ RETROGRADES Left 01/09/2016   Procedure: CYSTOSCOPY WITH LEFT RETROGRADE PYELOGRAM;  Surgeon: Belvie LITTIE Clara, MD;  Location: [facility];  Service: Urology;  Laterality: Left;   CYSTOSCOPY W/ RETROGRADES Bilateral 05/28/2016   Procedure: CYSTOSCOPY WITH RETROGRADE PYELOGRAM;  Surgeon: Belvie LITTIE Clara, MD;  Location: [facility];  Service: Urology;  Laterality: Bilateral;   CYSTOSCOPY W/ RETROGRADES Bilateral 02/17/2021   Procedure: CYSTOSCOPY WITH RETROGRADE PYELOGRAM;  Surgeon: Rosalind Zachary NOVAK, MD;  Location: [facility];  Service: Urology;  Laterality: Bilateral;   CYSTOSCOPY W/ URETERAL STENT REMOVAL Left 01/09/2016   Procedure: CYSTOSCOPY WITH LEFT STENT EXCHANGE;  Surgeon: Belvie LITTIE Clara, MD;  Location: [facility];  Service: Urology;  Laterality: Left;   CYSTOSCOPY WITH BIOPSY N/A 05/28/2016   Procedure: CYSTOSCOPY WITH BIOPSY;  Surgeon: Belvie LITTIE Clara, MD;  Location: [facility];  Service: Urology;  Laterality: N/A;   CYSTOSCOPY WITH URETHRAL DILATATION N/A 05/28/2016   Procedure: CYSTOSCOPY WITH URETHRAL DILATATION;  Surgeon: Belvie LITTIE Clara, MD;  Location: [facility];  Service: Urology;  Laterality: N/A;   HEMOSTASIS CONTROL  08/08/2023   Procedure: HEMOSTASIS CONTROL;  Surgeon: Shelah Lamar RAMAN, MD;  Location[facility] ENDOSCOPY;  Service: Pulmonary;;   PROSTATE BIOPSY  05/08/12   Adenocarcinoma   RADIOACTIVE SEED IMPLANT  08/01/2012   Procedure: RADIOACTIVE SEED IMPLANT;  Surgeon: Thomasine Oiler, MD;  Location: [facility];  Service: Urology;  Laterality: N/A;  73 seeds implanted no seeds found in bladder   TRANSURETHRAL RESECTION OF BLADDER TUMOR N/A 11/03/2015   Procedure: TRANSURETHRAL RESECTION OF BLADDER TUMOR (TURBT)WITH INSERTION STENT;  Surgeon: Belvie LITTIE Clara, MD;  Location: [facility];  Service: Urology;  Laterality: N/A;   TRANSURETHRAL RESECTION OF BLADDER TUMOR N/A 02/17/2021   Procedure: TRANSURETHRAL RESECTION OF BLADDER TUMOR (TURBT);  Surgeon: Rosalind Zachary NOVAK, MD;  Location: [facility];  Service: Urology;  Laterality: N/A;  30 MINS   TRANSURETHRAL RESECTION OF BLADDER TUMOR WITH GYRUS (TURBT-GYRUS) N/A 01/09/2016   Procedure: TRANSURETHRAL RESECTION OF BLADDER TUMOR WITH GYRUS (TURBT-GYRUS);  Surgeon: Belvie LITTIE Clara, MD;  Location: [facility];  Service: Urology;  Laterality: N/A;   VIDEO BRONCHOSCOPY WITH RADIAL ENDOBRONCHIAL ULTRASOUND  08/08/2023   Procedure: VIDEO BRONCHOSCOPY WITH RADIAL ENDOBRONCHIAL ULTRASOUND;  Surgeon: Shelah Lamar RAMAN, MD;  Location: [facility] ENDOSCOPY;  Service: Pulmonary;;     FAMILY HISTORY:  Family History  Problem Relation Age of Onset   Hyperlipidemia Mother    Asthma Mother    Heart failure Mother    Aneurysm Father    Hyperlipidemia Sister    Asthma Sister      SOCIAL HISTORY:  reports that he quit smoking about 8 years ago. His smoking use included cigarettes.  He started smoking about 50 years ago. He has a 21 pack-year smoking history. He has never used smokeless tobacco. He reports current alcohol use. He reports current drug use. Drug: Marijuana.  The patient is single and accompanied by his niece John [name].  He lives in [place] .  He has worked in dance movement psychotherapist.   ALLERGIES: Patient has no known allergies.   MEDICATIONS:  Current Outpatient Medications  Medication Sig Dispense Refill   albuterol  (VENTOLIN  HFA) 108 (90 Base) MCG/ACT inhaler Inhale 1-2 puffs into the lungs every 6 (six) hours as needed for wheezing or shortness of breath. 8 g 2   amLODipine (NORVASC) 10 MG tablet Take 10 mg by mouth daily.     COMIRNATY syringe      FLUZONE HIGH-DOSE 0.5 ML injection      No current facility-administered medications for  this encounter.   Facility-Administered Medications Ordered in Other Encounters  Medication Dose Route Frequency Provider Last Rate Last Admin   fludeoxyglucose  F - 18 (FDG) injection 7.4 millicurie  7.4 millicurie Intravenous Once Marthann Duncans, MD         REVIEW OF SYSTEMS: On review of systems, the patient reports that he is doing okay overall.  He does get short of breath when walking on an incline but typically walks 5 miles per day.  He has lost about 5 pounds and has gained 1 pound back in the last few months.  He denies any cough, hemoptysis, chest pain or tightness.  No other complaints are verbalized.    PHYSICAL EXAM:  Wt Readings from Last 3 Encounters:  08/18/23 148 lb 2 oz (67.2 kg)  08/16/23 148 lb (67.1 kg)  08/08/23 152 lb (68.9 kg)   Temp Readings from Last 3 Encounters:  08/18/23 (!) 96.9 F (36.1 C) (Temporal)  08/08/23 98.1 F (36.7 C) (Temporal)  07/27/23 97.7 F (36.5 C) (Temporal)   BP Readings from Last 3 Encounters:  08/18/23 120/60  08/16/23 120/84  08/08/23 (!) 117/55   Pulse Readings from Last 3 Encounters:  08/18/23 76  08/16/23 78  08/08/23 69   Pain Assessment Pain Score: 0-No pain/10  In general this is a well appearing African-American male in no acute distress.  He's alert and oriented x4 and appropriate throughout the examination. Cardiopulmonary assessment is negative for acute distress and he exhibits normal effort.     ECOG = 1  0 - Asymptomatic (Fully active, able to carry on all predisease activities without restriction)  1 - Symptomatic but completely ambulatory (Restricted in physically strenuous activity but ambulatory and able to carry out work of a light or sedentary nature. For example, light housework, office work)  2 - Symptomatic, <50% in bed during the day (Ambulatory and capable of all self care but unable to carry out any work activities. Up and about more than 50% of waking hours)  3 - Symptomatic, >50% in bed, but  not bedbound (Capable of only limited self-care, confined to bed or chair 50% or more of waking hours)  4 - Bedbound (Completely disabled. Cannot carry on any self-care. Totally confined to bed or chair)  5 - Death   John [name] MM, John [name], John [name], et al. [phone]). Toxicity and response criteria of the [facility]. Am. DOROTHA Bridges. Oncol. 5 (6): 649-55    LABORATORY DATA:  Lab Results  Component Value Date   WBC 8.9 08/08/2023   HGB 12.7 (L) 08/08/2023   HCT 38.7 (L) 08/08/2023  MCV 98.0 08/08/2023   PLT 210 08/08/2023   Lab Results  Component Value Date   NA 138 08/08/2023   K 3.8 08/08/2023   CL 106 08/08/2023   CO2 23 08/08/2023   Lab Results  Component Value Date   ALT 9 07/25/2012   AST 17 07/25/2012   ALKPHOS 69 07/25/2012   BILITOT 0.5 07/25/2012      RADIOGRAPHY: CT Super D Chest Wo Contrast Result Date: 08/10/2023 CLINICAL DATA:  Shortness of breath. Bladder and prostate cancer, lung mass. * Tracking Code: BO * EXAM: CT CHEST WITHOUT CONTRAST TECHNIQUE: Multidetector CT imaging of the chest was performed using thin slice collimation for electromagnetic bronchoscopy planning purposes, without intravenous contrast. RADIATION DOSE REDUCTION: This exam was performed according to the departmental dose-optimization program which includes automated exposure control, adjustment of the mA and/or kV according to patient size and/or use of iterative reconstruction technique. COMPARISON:  05/05/2023. FINDINGS: Cardiovascular: Atherosclerotic calcification of the aorta, aortic valve and coronary arteries. Heart is at the upper limits of normal in size to mildly enlarged. No pericardial effusion. Enlarged pulmonic trunk. Mediastinum/Nodes: Enlarging AP window mass, measuring approximately 6.7 x 9.2 cm, previously 6.1 x 7.4 cm. Hilar regions are difficult to definitively evaluate without IV contrast. No axillary adenopathy. Air in the esophagus can be seen with  dysmotility. Lungs/Pleura: Centrilobular and paraseptal emphysema. Spiculated nodule in the posterior segment right upper lobe measures 2.3 x 2.6 cm (5/65), enlarged from 1.8 x 2.4 cm on 05/05/2023. Peripheral and basilar predominant subpleural ground-glass, traction bronchiectasis/bronchiolectasis and subpleural reticular densities. Obstruction of the left upper lobe bronchus with narrowing of the lingular bronchus. No pleural fluid. Debris in the airway. Upper Abdomen: Visualized portions of the liver, gallbladder, adrenal glands, kidneys, spleen, pancreas, stomach and bowel are grossly unremarkable. No upper abdominal adenopathy. Musculoskeletal: Degenerative changes in the spine. IMPRESSION: 1. Progressive stage IV lung cancer as evidenced by and enlarging AP window mass and enlarging spiculated right upper lobe nodule. 2. 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 3. Aortic atherosclerosis (ICD10-I70.0). Coronary artery calcification. 4. Enlarged pulmonic trunk, indicative of pulmonary arterial hypertension. 5.  Emphysema (ICD10-J43.9). Electronically Signed   By: Newell Eke M.D.   On: 08/10/2023 14:26   DG Chest Port 1 View Result Date: 08/08/2023 CLINICAL DATA:  Status post bronchoscopy with biopsy. EXAM: PORTABLE CHEST 1 VIEW COMPARISON:  Chest CT 08/03/2023 FINDINGS: Fullness in left mediastinum compatible with known mass. Patchy densities in right suprahilar region also compatible with known lesion on prior CT. No evidence for pneumothorax. Heart size is stable. IMPRESSION: 1. No evidence for pneumothorax. 2. Known chest lesions. Electronically Signed   By: Juliene Balder M.D.   On: 08/08/2023 14:14   DG C-ARM BRONCHOSCOPY Result Date: 08/08/2023 C-ARM BRONCHOSCOPY: Fluoroscopy was utilized by the requesting physician.  No radiographic interpretation.       IMPRESSION/PLAN: 1. Small cell carcinoma involving the LUL and synchronous Stage IA3, cT1cN0M0, NSCLC,  adenocarcinoma of the RUL.  Dr. Dewey discusses the patient's imaging and workup thus far and agrees with the plans for tomorrow's PET scan and MRI brain.  We will follow-up with thoracic oncology navigation so that the patient can be seen urgently by medical oncology.  We discussed the considerations of chemoradiation to the left upper lobe with plans to begin this on 08/29/2023.  The patient is in agreement with this plan.  We also discussed the rationale to consider stereotactic body radiotherapy (SBRT) to the right upper lobe definitively for  treatment following chemoradiation.  We discussed the risks, benefits, short and long-term effects of radiotherapy.  The patient is in agreement with this plan and will simulate tomorrow. Written consent is obtained and placed in the chart, a copy was provided to the patient.  2. History of T1c favorable adenocarcinoma of the prostate.  The patient continues to be without evidence of disease based on PSA and follows with [facility].  He is also followed in that group as well because of his history of transitional bladder cancer and we appreciate their recommendations and management for the patient.  In a visit lasting 60 minutes, greater than 50% of the time was spent face to face discussing the patient's condition, in preparation for the discussion, and coordinating the patient's care.   The above documentation reflects my direct findings during this shared patient visit. Please see the separate note by Dr. Dewey on this date for the remainder of the patient's plan of care.    John [name], [facility]   **Disclaimer: This note was dictated with voice recognition software. Similar sounding words can inadvertently be transcribed and this note may contain transcription errors which may not have been corrected upon publication of note.**

## 2023-08-22 ENCOUNTER — Other Ambulatory Visit: Payer: Self-pay

## 2023-08-22 DIAGNOSIS — C3411 Malignant neoplasm of upper lobe, right bronchus or lung: Secondary | ICD-10-CM

## 2023-08-23 ENCOUNTER — Inpatient Hospital Stay: Payer: 59

## 2023-08-23 ENCOUNTER — Inpatient Hospital Stay: Payer: 59 | Admitting: Internal Medicine

## 2023-08-23 NOTE — Progress Notes (Unsigned)
[facility] [phone]   [phone]  CONSULT NOTE  REFERRING PHYSICIAN: Dr. Levy Pupa  REASON FOR CONSULTATION:  78 years old African-American male recently diagnosed with lung cancer  HPI AUSAR GEORGIOU is a 78 y.o. 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     The patient was seen by his primary care provider complaining of shortness of breath and he had CT angiogram of the chest on May 02, 2023 and that showed large left hilar mass measuring 7.4 x 6.1 cm causing severe narrowing of the left upper lobe pulmonary artery and superior left pulmonary vein with mildly enlarged right hilar lymph node measuring 1.0 cm there was also a spiculated solid nodule of the posterior right upper lobe with perifissural component measuring 2.4 x 1.8 cm.  There was also findings consistent with interstitial lung disease.    On August 08, 2023 the patient underwent video bronchoscopy with robotic assisted bronchoscopic navigation under the care of Dr. Delton Coombes.    The final cytology ([id number]) of the right upper lobe fine-needle aspiration was consistent with adenocarcinoma. Immunohistochemical stains are positive for CK7 and TTF-1 and are negative for CD56, synaptophysin and p40.  The left upper lobe biopsy ([id number]) was consistent with a small cell carcinoma. Immunohistochemical stains are positive for TTF-1, synaptophysin and CD56 and are negative for CK7 and p40 immunophenotypically consistent with small cell carcinoma.    The patient had the a PET scan on 08/19/2023 but the report is still pending.  He had MRI of the brain on the same day that showed no  evidence of metastatic disease to the brain.  He was referred to me today for evaluation and recommendation regarding treatment of his condition.  Overall, the patient is feeling *** today. He denies fevers, chills, night sweats, or unexplained weight loss. He denies chest pain, shortness of breath, cough, and hemoptysis. Denies nausea, vomiting, diarrhea, or constipation. Denies headaches or vision changes.    HPI  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 at a level above the muscularis mucosa (per path report)   Frequency of urination 02/11/2021   History of prostate cancer 2014   dx 2013--  Stage T1c,  Gleason 3+3,  PSA 6.38,  vol 25.41cc--  s/p  radioactive seed implants 08-01-2012 urologist-  dr Ronne Binning--  last PSA 0.8  in April 2017   Hydrocele    Hypertension 02/11/2021   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   worked up with cardiology dr hochrein and f/u prn   Nocturia 02/11/2021   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   Wears glasses 02/11/2021    Past Surgical History:  Procedure Laterality Date   ABDOMINAL HERNIA REPAIR  1990's   BRONCHIAL BIOPSY  08/08/2023   Procedure: BRONCHIAL BIOPSIES;  Surgeon: Leslye Peer, MD;  Location: [facility] ENDOSCOPY;  Service: Pulmonary;;   BRONCHIAL BRUSHINGS  08/08/2023   Procedure: BRONCHIAL BRUSHINGS;  Surgeon: Leslye Peer, MD;  Location: [facility] ENDOSCOPY;  Service: Pulmonary;;   BRONCHIAL  NEEDLE ASPIRATION BIOPSY  08/08/2023   Procedure: BRONCHIAL NEEDLE ASPIRATION BIOPSIES;  Surgeon: Leslye Peer, MD;  Location: [facility] ENDOSCOPY;  Service: Pulmonary;;   CYSTOSCOPY W/ RETROGRADES N/A 11/03/2015   Procedure: CYSTOSCOPY WITH RETROGRADE PYELOGRAM;  Surgeon: Malen Gauze, MD;  Location: [facility];  Service:  Urology;  Laterality: N/A;   CYSTOSCOPY W/ RETROGRADES Left 01/09/2016   Procedure: CYSTOSCOPY WITH LEFT RETROGRADE PYELOGRAM;  Surgeon: Malen Gauze, MD;  Location: [facility];  Service: Urology;  Laterality: Left;   CYSTOSCOPY W/ RETROGRADES Bilateral 05/28/2016   Procedure: CYSTOSCOPY WITH RETROGRADE PYELOGRAM;  Surgeon: Malen Gauze, MD;  Location: [facility];  Service: Urology;  Laterality: Bilateral;   CYSTOSCOPY W/ RETROGRADES Bilateral 02/17/2021   Procedure: CYSTOSCOPY WITH RETROGRADE PYELOGRAM;  Surgeon: Belva Agee, MD;  Location: [facility];  Service: Urology;  Laterality: Bilateral;   CYSTOSCOPY W/ URETERAL STENT REMOVAL Left 01/09/2016   Procedure: CYSTOSCOPY WITH LEFT STENT EXCHANGE;  Surgeon: Malen Gauze, MD;  Location: [facility];  Service: Urology;  Laterality: Left;   CYSTOSCOPY WITH BIOPSY N/A 05/28/2016   Procedure: CYSTOSCOPY WITH BIOPSY;  Surgeon: Malen Gauze, MD;  Location: [facility];  Service: Urology;  Laterality: N/A;   CYSTOSCOPY WITH URETHRAL DILATATION N/A 05/28/2016   Procedure: CYSTOSCOPY WITH URETHRAL DILATATION;  Surgeon: Malen Gauze, MD;  Location: [facility];  Service: Urology;  Laterality: N/A;   HEMOSTASIS CONTROL  08/08/2023   Procedure: HEMOSTASIS CONTROL;  Surgeon: Leslye Peer, MD;  Location: [facility] ENDOSCOPY;  Service: Pulmonary;;   PROSTATE BIOPSY  05/08/12   Adenocarcinoma   RADIOACTIVE SEED IMPLANT  08/01/2012   Procedure: RADIOACTIVE SEED IMPLANT;  Surgeon: Lindaann Slough, MD;  Location: [facility];  Service: Urology;  Laterality: N/A;  73 seeds implanted no seeds found in bladder   TRANSURETHRAL RESECTION OF BLADDER TUMOR N/A 11/03/2015   Procedure: TRANSURETHRAL RESECTION OF BLADDER TUMOR (TURBT)WITH INSERTION STENT;  Surgeon: Malen Gauze, MD;  Location: [facility];  Service:  Urology;  Laterality: N/A;   TRANSURETHRAL RESECTION OF BLADDER TUMOR N/A 02/17/2021   Procedure: TRANSURETHRAL RESECTION OF BLADDER TUMOR (TURBT);  Surgeon: Belva Agee, MD;  Location: [facility];  Service: Urology;  Laterality: N/A;  30 MINS   TRANSURETHRAL RESECTION OF BLADDER TUMOR WITH GYRUS (TURBT-GYRUS) N/A 01/09/2016   Procedure: TRANSURETHRAL RESECTION OF BLADDER TUMOR WITH GYRUS (TURBT-GYRUS);  Surgeon: Malen Gauze, MD;  Location: [facility];  Service: Urology;  Laterality: N/A;   VIDEO BRONCHOSCOPY WITH RADIAL ENDOBRONCHIAL ULTRASOUND  08/08/2023   Procedure: VIDEO BRONCHOSCOPY WITH RADIAL ENDOBRONCHIAL ULTRASOUND;  Surgeon: Leslye Peer, MD;  Location: [facility] ENDOSCOPY;  Service: Pulmonary;;    Family History  Problem Relation Age of Onset   Hyperlipidemia Mother    Asthma Mother    Heart failure Mother    Aneurysm Father    Hyperlipidemia Sister    Asthma Sister     Social History Social History   Tobacco Use   Smoking status: Former    Current packs/day: 0.00    Average packs/day: 0.5 packs/day for 42.0 years (21.0 ttl pk-yrs)    Types: Cigarettes    Start date: 10/28/1972    Quit date: 10/29/2014    Years since quitting: 8.8   Smokeless tobacco: Never  Vaping Use   Vaping status: Never Used  Substance Use Topics   Alcohol use:  Yes    Comment: occasionally blended whiskey    Drug use: Yes    Types: Marijuana    No Known Allergies  Current Outpatient Medications  Medication Sig Dispense Refill   albuterol (VENTOLIN HFA) 108 (90 Base) MCG/ACT inhaler Inhale 1-2 puffs into the lungs every 6 (six) hours as needed for wheezing or shortness of breath. 8 g 2   amLODipine (NORVASC) 10 MG tablet Take 10 mg by mouth daily.     COMIRNATY syringe      FLUZONE HIGH-DOSE 0.5 ML injection      No current facility-administered medications for this visit.   Facility-Administered Medications Ordered in Other Visits  Medication Dose  Route Frequency Provider Last Rate Last Admin   fludeoxyglucose F - 18 (FDG) injection 7.4 millicurie  7.4 millicurie Intravenous Once Jeronimo Greaves, MD        Review of Systems  {Ros - complete:30496}  Physical Exam  RAL:{CHL ONC PE [phone]} SKIN: {CHL ONC PE NWGN:5621308657} HEAD: {CHL ONC PE QION:6295284132} EYES: {CHL ONC PE GMWN:0272536644} EARS: {CHL ONC PE IHKV:4259563875} OROPHARYNX:{CHL ONC PE OROPHARYNX:[phone]}  NECK: {CHL ONC PE IEPP:2951884166} LYMPH:  {CHL ONC PE AYTKZ:6010932355} BREAST:{CHL ONC PE BREAST:[phone]} LUNGS: {CHL ONC PE DDUKG:2542706237} HEART: {CHL ONC PE SEGBT:5176160737} ABDOMEN:{CHL ONC PE [phone]} BACK: {CHL ONC PE TGGY:6948546270} EXTREMITIES:{CHL ONC PE EXTREMITIES:[phone]}  NEURO: {CHL ONC PE NEURO:[phone]}  PERFORMANCE STATUS: ECOG ***  LABORATORY DATA: Lab Results  Component Value Date   WBC 8.9 08/08/2023   HGB 12.7 (L) 08/08/2023   HCT 38.7 (L) 08/08/2023   MCV 98.0 08/08/2023   PLT 210 08/08/2023      Chemistry      Component Value Date/Time   NA 138 08/08/2023 0735   K 3.8 08/08/2023 0735   CL 106 08/08/2023 0735   CO2 23 08/08/2023 0735   BUN 16 08/08/2023 0735   CREATININE 1.12 08/08/2023 0735      Component Value Date/Time   CALCIUM 9.0 08/08/2023 0735   ALKPHOS 69 07/25/2012 1023   AST 17 07/25/2012 1023   ALT 9 07/25/2012 1023   BILITOT 0.5 07/25/2012 1023       RADIOGRAPHIC STUDIES: MR BRAIN W WO CONTRAST Result Date: 08/22/2023 CLINICAL DATA:  Provided history: Malignant neoplasm of unspecified part of unspecified bronchus or lung. Non-small cell lung cancer, staging. EXAM: MRI HEAD WITHOUT AND WITH CONTRAST TECHNIQUE: Multiplanar, multiecho pulse sequences of the brain and surrounding structures were obtained without and with intravenous contrast. CONTRAST:  6mL GADAVIST GADOBUTROL 1 MMOL/ML IV SOLN COMPARISON:  None. FINDINGS: Brain: Mild-to-moderate generalized parenchymal  atrophy. Multifocal T2 FLAIR hyperintense signal abnormality within the cerebral white matter and pons, nonspecific but compatible with minimal chronic small vessel ischemic disease. No cortical encephalomalacia identified. There is no acute infarct. No evidence of an intracranial mass. No chronic intracranial blood products. No extra-axial fluid collection. No midline shift. No pathologic intracranial enhancement is identified. Vascular: Maintained flow voids within the proximal large arterial vessels. Skull and upper cervical spine: No focal worrisome marrow lesion. Incompletely assessed cervical spondylosis. Sinuses/orbits: No orbital mass or acute orbital finding.Minimal mucosal thickening within the bilateral ethmoid and left sphenoid sinuses. Small mucous retention cyst within the right maxillary sinus. IMPRESSION: 1. No evidence of intracranial metastatic disease. 2. Minimal chronic small vessel ischemic changes within the cerebral white matter and pons. 3. Mild-to-moderate generalized parenchymal atrophy. 4. Paranasal sinus disease as described. Electronically Signed   By: Jackey Loge D.O.   On: 08/22/2023 09:32   CT  Super D Chest Wo Contrast Result Date: 08/10/2023 CLINICAL DATA:  Shortness of breath. Bladder and prostate cancer, lung mass. * Tracking Code: BO * EXAM: CT CHEST WITHOUT CONTRAST TECHNIQUE: Multidetector CT imaging of the chest was performed using thin slice collimation for electromagnetic bronchoscopy planning purposes, without intravenous contrast. RADIATION DOSE REDUCTION: This exam was performed according to the departmental dose-optimization program which includes automated exposure control, adjustment of the mA and/or kV according to patient size and/or use of iterative reconstruction technique. COMPARISON:  05/05/2023. FINDINGS: Cardiovascular: Atherosclerotic calcification of the aorta, aortic valve and coronary arteries. Heart is at the upper limits of normal in size to mildly  enlarged. No pericardial effusion. Enlarged pulmonic trunk. Mediastinum/Nodes: Enlarging AP window mass, measuring approximately 6.7 x 9.2 cm, previously 6.1 x 7.4 cm. Hilar regions are difficult to definitively evaluate without IV contrast. No axillary adenopathy. Air in the esophagus can be seen with dysmotility. Lungs/Pleura: Centrilobular and paraseptal emphysema. Spiculated nodule in the posterior segment right upper lobe measures 2.3 x 2.6 cm (5/65), enlarged from 1.8 x 2.4 cm on 05/05/2023. Peripheral and basilar predominant subpleural ground-glass, traction bronchiectasis/bronchiolectasis and subpleural reticular densities. Obstruction of the left upper lobe bronchus with narrowing of the lingular bronchus. No pleural fluid. Debris in the airway. Upper Abdomen: Visualized portions of the liver, gallbladder, adrenal glands, kidneys, spleen, pancreas, stomach and bowel are grossly unremarkable. No upper abdominal adenopathy. Musculoskeletal: Degenerative changes in the spine. IMPRESSION: 1. Progressive stage IV lung cancer as evidenced by and enlarging AP window mass and enlarging spiculated right upper lobe nodule. 2. 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 3. Aortic atherosclerosis (ICD10-I70.0). Coronary artery calcification. 4. Enlarged pulmonic trunk, indicative of pulmonary arterial hypertension. 5.  Emphysema (ICD10-J43.9). Electronically Signed   By: Leanna Battles M.D.   On: 08/10/2023 14:26   DG Chest Port 1 View Result Date: 08/08/2023 CLINICAL DATA:  Status post bronchoscopy with biopsy. EXAM: PORTABLE CHEST 1 VIEW COMPARISON:  Chest CT 08/03/2023 FINDINGS: Fullness in left mediastinum compatible with known mass. Patchy densities in right suprahilar region also compatible with known lesion on prior CT. No evidence for pneumothorax. Heart size is stable. IMPRESSION: 1. No evidence for pneumothorax. 2. Known chest lesions. Electronically Signed   By: Richarda Overlie M.D.    On: 08/08/2023 14:14   DG C-ARM BRONCHOSCOPY Result Date: 08/08/2023 C-ARM BRONCHOSCOPY: Fluoroscopy was utilized by the requesting physician.  No radiographic interpretation.    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 He presented with ***.    ***PET and brain MRI  Dr. Arbutus Ped had a lengthly discussion with the patient today about his current condition and treatment options. Given the small cell lung cancer in the left lung, Dr. Arbutus Ped would recommend chemotherapy with carboplatin for an AUC of 5 on day 1 and etoposide 100 mg/m2 on days 1, 2 and 3 IV every 3 weeks. The patient is interested in proceeding with systemic chemotherapy.  He is expected to start her first dose of this treatment on 09/01/23.  We discussed the adverse side effects of treatment including but not limited to alopecia, myelosuppression, nausea and vomiting, peripheral neuropathy, liver or renal dysfunction as well as immunotherapy mediated adverse effects.   I will arrange for the patient to have a chemoeducation class prior to receiving his first cycle of chemotherapy.   I sent a prescription for Compazine  10 mg every 6 hours as needed for nausea.   The patient will follow-up in 2 weeks for a one-week follow-up visit after completing his first cycle of chemotherapy.  He will undergo SBRT to the synchronous stage I NSCLC on the right and definitive radiation on the left.   The patient voices understanding of current disease status and treatment options and is in agreement with the current care plan.  All questions were answered. The patient knows to call the clinic with any problems, questions or concerns. We can certainly see the patient much sooner if necessary.  Thank you so much for allowing me to participate in the care of Purvis Sheffield. I will continue to follow up the  patient with you and assist in his care.  I spent {CHL ONC TIME VISIT - HQION:[id number]} counseling the patient face to face. The total time spent in the appointment was {CHL ONC TIME VISIT - GMWNU:[id number]}.  Disclaimer: This note was dictated with voice recognition software. Similar sounding words can inadvertently be transcribed and may not be corrected upon review.   Aniylah Avans L Joeziah Voit August 23, 2023, 8:38 PM

## 2023-08-23 NOTE — Progress Notes (Signed)
The pt called me back inquiring about the appt I offered on 2/13. I told him our scheduler tried calling him and Steward Drone and left messages. Steward Drone states that she had not been able to check her messages. I reiterated the details of the appt to the pt and his sister. They are both aware that the pts lab appt is at the cancer center at 1pm and he will see Cassie with Dr Arbutus Ped at 1:30. No additional questions were voiced by the pt or Steward Drone at this time.

## 2023-08-23 NOTE — Progress Notes (Signed)
Pt did not show up for his consult with Dr. Arbutus Ped. I called the pt first. There was no answer at his listed number and the VM box had not yet been set up. I called his sister, Steward Drone, who is listed as his emergency contact. Steward Drone states "it was today? I'm not sure he knew that." I said I had spoken to him on Thursday 2/6 about it. Steward Drone took my name and contact information and said she'd have him call me. Awaiting call at this time.

## 2023-08-23 NOTE — Progress Notes (Deleted)
 [facility] Telephone:([phone]) [phone]   Fax:(336) [phone]  CONSULT NOTE  REFERRING PHYSICIAN: Dr. Levy Pupa  REASON FOR CONSULTATION:  78 years old African-American male recently diagnosed with lung cancer  HPI TREMOND SHIMABUKURO is a 78 y.o. 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  The patient was seen by his primary care provider complaining of shortness of breath and he had CT angiogram of the chest on May 02, 2023 and that showed large left hilar mass measuring 7.4 x 6.1 cm causing severe narrowing of the left upper lobe pulmonary artery and superior left pulmonary vein with mildly enlarged right hilar lymph node measuring 1.0 cm there was also a spiculated solid nodule of the posterior right upper lobe with perifissural component measuring 2.4 x 1.8 cm.  There was also findings consistent with interstitial lung disease.  On August 08, 2023 the patient underwent video bronchoscopy with robotic assisted bronchoscopic navigation under the care of Dr. Delton Coombes.  The final cytology ([id number]) of the right upper lobe fine-needle aspiration was consistent with adenocarcinoma. Immunohistochemical stains are positive for CK7 and TTF-1 and are negative for CD56, synaptophysin and p40.  The left upper lobe biopsy ([id number]) was consistent with a small cell carcinoma. Immunohistochemical stains are positive for TTF-1, synaptophysin and CD56 and are negative for CK7 and p40 immunophenotypically consistent with small cell carcinoma.  The patient had the a PET scan on 08/19/2023 but the report is still pending.  He had MRI of the brain on the same day that showed no evidence of  metastatic disease to the brain. He was referred to me today for evaluation and recommendation regarding treatment of his condition.   HPI  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 at a level above the muscularis mucosa (per path report)   Frequency of urination 02/11/2021   History of prostate cancer 2014   dx 2013--  Stage T1c,  Gleason 3+3,  PSA 6.38,  vol 25.41cc--  s/p  radioactive seed implants 08-01-2012 urologist-  dr Ronne Binning--  last PSA 0.8  in April 2017   Hydrocele    Hypertension 02/11/2021   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   worked up with cardiology dr hochrein and f/u prn   Nocturia 02/11/2021   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   Wears glasses 02/11/2021    Past Surgical History:  Procedure Laterality Date   ABDOMINAL HERNIA REPAIR  1990's   BRONCHIAL BIOPSY  08/08/2023   Procedure: BRONCHIAL BIOPSIES;  Surgeon: Leslye Peer, MD;  Location: [facility] ENDOSCOPY;  Service: Pulmonary;;   BRONCHIAL BRUSHINGS  08/08/2023   Procedure: BRONCHIAL BRUSHINGS;  Surgeon: Leslye Peer, MD;  Location: [facility] ENDOSCOPY;  Service: Pulmonary;;   BRONCHIAL NEEDLE ASPIRATION BIOPSY  08/08/2023   Procedure: BRONCHIAL NEEDLE ASPIRATION BIOPSIES;  Surgeon: Leslye Peer, MD;  Location: [facility] ENDOSCOPY;  Service: Pulmonary;;   CYSTOSCOPY W/ RETROGRADES N/A 11/03/2015   Procedure: CYSTOSCOPY WITH RETROGRADE PYELOGRAM;  Surgeon: Malen Gauze, MD;  Location: [facility];  Service: Urology;  Laterality: N/A;   CYSTOSCOPY W/ RETROGRADES Left 01/09/2016   Procedure: CYSTOSCOPY WITH LEFT RETROGRADE PYELOGRAM;  Surgeon: Malen Gauze, MD;  Location: [facility];  Service: Urology;  Laterality: Left;   CYSTOSCOPY W/ RETROGRADES Bilateral 05/28/2016    Procedure: CYSTOSCOPY WITH RETROGRADE PYELOGRAM;  Surgeon: Malen Gauze, MD;  Location: [facility];  Service: Urology;  Laterality: Bilateral;   CYSTOSCOPY W/ RETROGRADES Bilateral 02/17/2021   Procedure: CYSTOSCOPY WITH RETROGRADE PYELOGRAM;  Surgeon: Belva Agee, MD;  Location: [facility];  Service: Urology;  Laterality: Bilateral;   CYSTOSCOPY W/ URETERAL STENT REMOVAL Left 01/09/2016   Procedure: CYSTOSCOPY WITH LEFT STENT EXCHANGE;  Surgeon: Malen Gauze, MD;  Location: [facility];  Service: Urology;  Laterality: Left;   CYSTOSCOPY WITH BIOPSY N/A 05/28/2016   Procedure: CYSTOSCOPY WITH BIOPSY;  Surgeon: Malen Gauze, MD;  Location: [facility];  Service: Urology;  Laterality: N/A;   CYSTOSCOPY WITH URETHRAL DILATATION N/A 05/28/2016   Procedure: CYSTOSCOPY WITH URETHRAL DILATATION;  Surgeon: Malen Gauze, MD;  Location: [facility];  Service: Urology;  Laterality: N/A;   HEMOSTASIS CONTROL  08/08/2023   Procedure: HEMOSTASIS CONTROL;  Surgeon: Leslye Peer, MD;  Location: [facility] ENDOSCOPY;  Service: Pulmonary;;   PROSTATE BIOPSY  05/08/12   Adenocarcinoma   RADIOACTIVE SEED IMPLANT  08/01/2012   Procedure: RADIOACTIVE SEED IMPLANT;  Surgeon: Lindaann Slough, MD;  Location: [facility];  Service: Urology;  Laterality: N/A;  73 seeds implanted no seeds found in bladder   TRANSURETHRAL RESECTION OF BLADDER TUMOR N/A 11/03/2015   Procedure: TRANSURETHRAL RESECTION OF BLADDER TUMOR (TURBT)WITH INSERTION STENT;  Surgeon: Malen Gauze, MD;  Location: [facility];  Service: Urology;  Laterality: N/A;   TRANSURETHRAL RESECTION OF BLADDER TUMOR N/A 02/17/2021   Procedure: TRANSURETHRAL RESECTION OF BLADDER TUMOR (TURBT);  Surgeon: Belva Agee, MD;  Location: [facility];  Service: Urology;  Laterality: N/A;  30 MINS   TRANSURETHRAL RESECTION OF  BLADDER TUMOR WITH GYRUS (TURBT-GYRUS) N/A 01/09/2016   Procedure: TRANSURETHRAL RESECTION OF BLADDER TUMOR WITH GYRUS (TURBT-GYRUS);  Surgeon: Malen Gauze, MD;  Location: [facility];  Service: Urology;  Laterality: N/A;   VIDEO BRONCHOSCOPY WITH RADIAL ENDOBRONCHIAL ULTRASOUND  08/08/2023   Procedure: VIDEO BRONCHOSCOPY WITH RADIAL ENDOBRONCHIAL ULTRASOUND;  Surgeon: Leslye Peer, MD;  Location: [facility] ENDOSCOPY;  Service: Pulmonary;;    Family History  Problem Relation Age of Onset   Hyperlipidemia Mother    Asthma Mother    Heart failure Mother    Aneurysm Father    Hyperlipidemia Sister    Asthma Sister     Social History Social History   Tobacco Use   Smoking status: Former    Current packs/day: 0.00    Average packs/day: 0.5 packs/day for 42.0 years (21.0 ttl pk-yrs)    Types: Cigarettes    Start date: 10/28/1972    Quit date: 10/29/2014    Years since quitting: 8.8   Smokeless tobacco: Never  Vaping Use   Vaping status: Never Used  Substance Use Topics   Alcohol use: Yes    Comment: occasionally blended whiskey    Drug use: Yes    Types: Marijuana    No Known Allergies  Current Outpatient Medications  Medication Sig Dispense Refill   albuterol (VENTOLIN HFA) 108 (90 Base) MCG/ACT inhaler Inhale 1-2 puffs into the lungs  every 6 (six) hours as needed for wheezing or shortness of breath. 8 g 2   amLODipine (NORVASC) 10 MG tablet Take 10 mg by mouth daily.     COMIRNATY syringe      FLUZONE HIGH-DOSE 0.5 ML injection      No current facility-administered medications for this visit.   Facility-Administered Medications Ordered in Other Visits  Medication Dose Route Frequency Provider Last Rate Last Admin   fludeoxyglucose F - 18 (FDG) injection 7.4 millicurie  7.4 millicurie Intravenous Once Jeronimo Greaves, MD        Review of Systems  {Ros - complete:30496}  Physical Exam  RAL:{CHL ONC PE GENERAL:[phone]} SKIN: {CHL ONC PE  ZOXW:[id number]} HEAD: {CHL ONC PE XBJY:7829562130} EYES: {CHL ONC PE QMVH:8469629528} EARS: {CHL ONC PE UXLK:4401027253} OROPHARYNX:{CHL ONC PE OROPHARYNX:[id number]}  NECK: {CHL ONC PE GUYQ:0347425956} LYMPH:  {CHL ONC PE LOVFI:4332951884} BREAST:{CHL ONC PE BREAST:[phone]} LUNGS: {CHL ONC PE ZYSAY:3016010932} HEART: {CHL ONC PE TFTDD:2202542706} ABDOMEN:{CHL ONC PE [phone]} BACK: {CHL ONC PE CBJS:2831517616} EXTREMITIES:{CHL ONC PE [phone]}  NEURO: {CHL ONC PE NEURO:[phone]}  PERFORMANCE STATUS: ECOG ***  LABORATORY DATA: Lab Results  Component Value Date   WBC 8.9 08/08/2023   HGB 12.7 (L) 08/08/2023   HCT 38.7 (L) 08/08/2023   MCV 98.0 08/08/2023   PLT 210 08/08/2023      Chemistry      Component Value Date/Time   NA 138 08/08/2023 0735   K 3.8 08/08/2023 0735   CL 106 08/08/2023 0735   CO2 23 08/08/2023 0735   BUN 16 08/08/2023 0735   CREATININE 1.12 08/08/2023 0735      Component Value Date/Time   CALCIUM 9.0 08/08/2023 0735   ALKPHOS 69 07/25/2012 1023   AST 17 07/25/2012 1023   ALT 9 07/25/2012 1023   BILITOT 0.5 07/25/2012 1023       RADIOGRAPHIC STUDIES: MR BRAIN W WO CONTRAST Result Date: 08/22/2023 CLINICAL DATA:  Provided history: Malignant neoplasm of unspecified part of unspecified bronchus or lung. Non-small cell lung cancer, staging. EXAM: MRI HEAD WITHOUT AND WITH CONTRAST TECHNIQUE: Multiplanar, multiecho pulse sequences of the brain and surrounding structures were obtained without and with intravenous contrast. CONTRAST:  6mL GADAVIST GADOBUTROL 1 MMOL/ML IV SOLN COMPARISON:  None. FINDINGS: Brain: Mild-to-moderate generalized parenchymal atrophy. Multifocal T2 FLAIR hyperintense signal abnormality within the cerebral white matter and pons, nonspecific but compatible with minimal chronic small vessel ischemic disease. No cortical encephalomalacia identified. There is no acute infarct. No evidence of an intracranial  mass. No chronic intracranial blood products. No extra-axial fluid collection. No midline shift. No pathologic intracranial enhancement is identified. Vascular: Maintained flow voids within the proximal large arterial vessels. Skull and upper cervical spine: No focal worrisome marrow lesion. Incompletely assessed cervical spondylosis. Sinuses/orbits: No orbital mass or acute orbital finding.Minimal mucosal thickening within the bilateral ethmoid and left sphenoid sinuses. Small mucous retention cyst within the right maxillary sinus. IMPRESSION: 1. No evidence of intracranial metastatic disease. 2. Minimal chronic small vessel ischemic changes within the cerebral white matter and pons. 3. Mild-to-moderate generalized parenchymal atrophy. 4. Paranasal sinus disease as described. Electronically Signed   By: Jackey Loge D.O.   On: 08/22/2023 09:32   CT Super D Chest Wo Contrast Result Date: 08/10/2023 CLINICAL DATA:  Shortness of breath. Bladder and prostate cancer, lung mass. * Tracking Code: BO * EXAM: CT CHEST WITHOUT CONTRAST TECHNIQUE: Multidetector CT imaging of the chest was performed using thin slice collimation for electromagnetic bronchoscopy planning purposes, without intravenous  contrast. RADIATION DOSE REDUCTION: This exam was performed according to the departmental dose-optimization program which includes automated exposure control, adjustment of the mA and/or kV according to patient size and/or use of iterative reconstruction technique. COMPARISON:  05/05/2023. FINDINGS: Cardiovascular: Atherosclerotic calcification of the aorta, aortic valve and coronary arteries. Heart is at the upper limits of normal in size to mildly enlarged. No pericardial effusion. Enlarged pulmonic trunk. Mediastinum/Nodes: Enlarging AP window mass, measuring approximately 6.7 x 9.2 cm, previously 6.1 x 7.4 cm. Hilar regions are difficult to definitively evaluate without IV contrast. No axillary adenopathy. Air in the  esophagus can be seen with dysmotility. Lungs/Pleura: Centrilobular and paraseptal emphysema. Spiculated nodule in the posterior segment right upper lobe measures 2.3 x 2.6 cm (5/65), enlarged from 1.8 x 2.4 cm on 05/05/2023. Peripheral and basilar predominant subpleural ground-glass, traction bronchiectasis/bronchiolectasis and subpleural reticular densities. Obstruction of the left upper lobe bronchus with narrowing of the lingular bronchus. No pleural fluid. Debris in the airway. Upper Abdomen: Visualized portions of the liver, gallbladder, adrenal glands, kidneys, spleen, pancreas, stomach and bowel are grossly unremarkable. No upper abdominal adenopathy. Musculoskeletal: Degenerative changes in the spine. IMPRESSION: 1. Progressive stage IV lung cancer as evidenced by and enlarging AP window mass and enlarging spiculated right upper lobe nodule. 2. 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 3. Aortic atherosclerosis (ICD10-I70.0). Coronary artery calcification. 4. Enlarged pulmonic trunk, indicative of pulmonary arterial hypertension. 5.  Emphysema (ICD10-J43.9). Electronically Signed   By: Leanna Battles M.D.   On: 08/10/2023 14:26   DG Chest Port 1 View Result Date: 08/08/2023 CLINICAL DATA:  Status post bronchoscopy with biopsy. EXAM: PORTABLE CHEST 1 VIEW COMPARISON:  Chest CT 08/03/2023 FINDINGS: Fullness in left mediastinum compatible with known mass. Patchy densities in right suprahilar region also compatible with known lesion on prior CT. No evidence for pneumothorax. Heart size is stable. IMPRESSION: 1. No evidence for pneumothorax. 2. Known chest lesions. Electronically Signed   By: Richarda Overlie M.D.   On: 08/08/2023 14:14   DG C-ARM BRONCHOSCOPY Result Date: 08/08/2023 C-ARM BRONCHOSCOPY: Fluoroscopy was utilized by the requesting physician.  No radiographic interpretation.    ASSESSMENT:   PLAN:  The patient voices understanding of current disease status and  treatment options and is in agreement with the current care plan.  All questions were answered. The patient knows to call the clinic with any problems, questions or concerns. We can certainly see the patient much sooner if necessary.  Thank you so much for allowing me to participate in the care of Purvis Sheffield. I will continue to follow up the patient with you and assist in his care.  I spent {CHL ONC TIME VISIT - WUJWJ:[id number]} counseling the patient face to face. The total time spent in the appointment was {CHL ONC TIME VISIT - OZHYQ:[id number]}.  Disclaimer: This note was dictated with voice recognition software. Similar sounding words can inadvertently be transcribed and may not be corrected upon review.   Andora Krull K Deshawna Mcneece August 23, 2023, 1:15 PM

## 2023-08-25 ENCOUNTER — Other Ambulatory Visit: Payer: Self-pay

## 2023-08-25 ENCOUNTER — Telehealth: Payer: Self-pay | Admitting: Radiation Oncology

## 2023-08-25 ENCOUNTER — Inpatient Hospital Stay: Payer: 59

## 2023-08-25 ENCOUNTER — Inpatient Hospital Stay (HOSPITAL_BASED_OUTPATIENT_CLINIC_OR_DEPARTMENT_OTHER): Payer: 59 | Admitting: Physician Assistant

## 2023-08-25 ENCOUNTER — Other Ambulatory Visit (HOSPITAL_COMMUNITY): Payer: No Typology Code available for payment source

## 2023-08-25 VITALS — BP 138/68 | HR 48 | Temp 97.9°F | Resp 16 | Wt 150.2 lb

## 2023-08-25 DIAGNOSIS — C3411 Malignant neoplasm of upper lobe, right bronchus or lung: Secondary | ICD-10-CM | POA: Insufficient documentation

## 2023-08-25 DIAGNOSIS — Z79630 Long term (current) use of alkylating agent: Secondary | ICD-10-CM | POA: Insufficient documentation

## 2023-08-25 DIAGNOSIS — Z8551 Personal history of malignant neoplasm of bladder: Secondary | ICD-10-CM | POA: Insufficient documentation

## 2023-08-25 DIAGNOSIS — Z79634 Long term (current) use of topoisomerase inhibitor: Secondary | ICD-10-CM | POA: Insufficient documentation

## 2023-08-25 DIAGNOSIS — Z5112 Encounter for antineoplastic immunotherapy: Secondary | ICD-10-CM | POA: Insufficient documentation

## 2023-08-25 DIAGNOSIS — Z87891 Personal history of nicotine dependence: Secondary | ICD-10-CM

## 2023-08-25 DIAGNOSIS — C3490 Malignant neoplasm of unspecified part of unspecified bronchus or lung: Secondary | ICD-10-CM | POA: Diagnosis not present

## 2023-08-25 DIAGNOSIS — Z8546 Personal history of malignant neoplasm of prostate: Secondary | ICD-10-CM | POA: Insufficient documentation

## 2023-08-25 DIAGNOSIS — C3412 Malignant neoplasm of upper lobe, left bronchus or lung: Secondary | ICD-10-CM | POA: Diagnosis not present

## 2023-08-25 DIAGNOSIS — Z79620 Long term (current) use of immunosuppressive biologic: Secondary | ICD-10-CM | POA: Insufficient documentation

## 2023-08-25 DIAGNOSIS — Z510 Encounter for antineoplastic radiation therapy: Secondary | ICD-10-CM | POA: Diagnosis not present

## 2023-08-25 DIAGNOSIS — Z5111 Encounter for antineoplastic chemotherapy: Secondary | ICD-10-CM | POA: Insufficient documentation

## 2023-08-25 DIAGNOSIS — Z7189 Other specified counseling: Secondary | ICD-10-CM | POA: Insufficient documentation

## 2023-08-25 LAB — CMP (CANCER CENTER ONLY)
ALT: 16 U/L (ref 0–44)
AST: 17 U/L (ref 15–41)
Albumin: 3.9 g/dL (ref 3.5–5.0)
Alkaline Phosphatase: 63 U/L (ref 38–126)
Anion gap: 4 — ABNORMAL LOW (ref 5–15)
BUN: 10 mg/dL (ref 8–23)
CO2: 32 mmol/L (ref 22–32)
Calcium: 9.5 mg/dL (ref 8.9–10.3)
Chloride: 105 mmol/L (ref 98–111)
Creatinine: 1.12 mg/dL (ref 0.61–1.24)
GFR, Estimated: >60 mL/min (ref >60)
Glucose, Bld: 109 mg/dL — ABNORMAL HIGH (ref 70–99)
Potassium: 3.7 mmol/L (ref 3.5–5.1)
Sodium: 141 mmol/L (ref 135–145)
Total Bilirubin: 0.6 mg/dL (ref 0.0–1.2)
Total Protein: 7.1 g/dL (ref 6.5–8.1)

## 2023-08-25 LAB — CBC WITH DIFFERENTIAL (CANCER CENTER ONLY)
Abs Immature Granulocytes: 0.05 10*3/uL (ref 0.00–0.07)
Basophils Absolute: 0.1 10*3/uL (ref 0.0–0.1)
Basophils Relative: 1 %
Eosinophils Absolute: 0.2 10*3/uL (ref 0.0–0.5)
Eosinophils Relative: 1 %
HCT: 40.4 % (ref 39.0–52.0)
Hemoglobin: 13.3 g/dL (ref 13.0–17.0)
Immature Granulocytes: 1 %
Lymphocytes Relative: 16 %
Lymphs Abs: 1.7 10*3/uL (ref 0.7–4.0)
MCH: 31.9 pg (ref 26.0–34.0)
MCHC: 32.9 g/dL (ref 30.0–36.0)
MCV: 96.9 fL (ref 80.0–100.0)
Monocytes Absolute: 0.8 10*3/uL (ref 0.1–1.0)
Monocytes Relative: 8 %
Neutro Abs: 7.8 10*3/uL — ABNORMAL HIGH (ref 1.7–7.7)
Neutrophils Relative %: 73 %
Platelet Count: 262 10*3/uL (ref 150–400)
RBC: 4.17 MIL/uL — ABNORMAL LOW (ref 4.22–5.81)
RDW: 12.2 % (ref 11.5–15.5)
WBC Count: 10.7 10*3/uL — ABNORMAL HIGH (ref 4.0–10.5)
nRBC: 0 % (ref 0.0–0.2)

## 2023-08-25 MED ORDER — PROCHLORPERAZINE MALEATE 10 MG PO TABS: 10.0000 mg | ORAL_TABLET | Freq: Four times a day (QID) | ORAL | 2 refills | Status: AC | PRN

## 2023-08-25 MED ORDER — LIDOCAINE-PRILOCAINE 2.5-2.5 % EX CREA: 1.0000 | TOPICAL_CREAM | CUTANEOUS | 2 refills | Status: AC | PRN

## 2023-08-25 NOTE — Telephone Encounter (Signed)
I called the patient and his niece Martie Lee Mson to let them know about his PET scan results. He is aware his case was discussed in thoracic oncology conference and Dr. Arbutus Ped will recommend radiation for extensive stage disease. The patient states he's had several subcutaneous nodules that have been present for about 2-3 weeks. Dr. Mitzi Hansen recommends giving a shorter course of radiation over 3 weeks with palliative intent. We will follow up with how his left lung cancer responds to systemic therapy as well and revisit the discussion for possible SBRT to the RUL. He is in agreement with this plan.

## 2023-08-25 NOTE — Patient Instructions (Addendum)
-  There are two main categories of lung cancer, they are named based on the size of the cancer cell. One is called Non-Small cell lung cancer. The other type is Small Cell Lung Cancer -The sample (biopsy) that they took of your tumor was consistent with Small Cell Lung Cancer -We covered a lot of important information at your appointment today regarding what the treatment plan is moving forward. Here are the the main points that were discussed at your office visit with [place] today:  -The treatment that you will receive consists of Two chemotherapy drugs, called Carboplatin and Etoposide and One Immunotherapy drug called Imfinzi (Durvalumab)  -We are planning on starting your treatment next week on 09/05/23 but before your start your treatment, I would like you to attend a Chemotherapy Education Class. This involves having you sit down with one of our nurse educators. She will discuss with your one-on-one more details about your treatment as well as general information about resources here at the [facility].  -Your treatment will be given for three consecutive days every 3 weeks. We will check your labs once a week for the first ~4 treatments just to make sure that important components of your blood are in an acceptable range -We will get a CT scan after 2 treatments to check on the progress of treatment  Medications:  -I have sent a few important medication prescriptions to your pharmacy.  -Compazine was sent to your pharmacy. This medication is for nausea. You may take this every 6 hours as needed if you feel nausous. .   Side Effects:  -The adverse effect of this treatment including but not limited to alopecia (losing your hair), myelosuppression (drops in the blood counts), nausea and vomiting, peripheral neuropathy (numbness and tingling in the hands and feet), liver or renal dysfunction.   Referrals:   Imaging:   Follow up:  -We will see you back for a follow up visit in __ weeks.

## 2023-08-25 NOTE — Progress Notes (Signed)
START ON PATHWAY REGIMEN - Small Cell Lung     Cycles 1 through 4: A cycle is every 21 days:     Durvalumab      Carboplatin      Etoposide    Cycles 5 and beyond: A cycle is every 28 days:     Durvalumab   **Always confirm dose/schedule in your pharmacy ordering system**  Patient Characteristics: Newly Diagnosed, Preoperative or Nonsurgical Candidate (Clinical Staging), First Line, Extensive Stage Therapeutic Status: Newly Diagnosed, Preoperative or Nonsurgical Candidate (Clinical Staging) AJCC T Category: cT4 AJCC N Category: cN2a AJCC M Category: cM1b AJCC 9 Stage Grouping: IVA Check here if patient was staged using an edition other than AJCC Staging 9th Edition: false Stage Classification: Extensive Intent of Therapy: Non-Curative / Palliative Intent, Discussed with Patient

## 2023-08-26 ENCOUNTER — Encounter: Payer: Self-pay | Admitting: Internal Medicine

## 2023-08-26 ENCOUNTER — Encounter (HOSPITAL_COMMUNITY): Payer: Self-pay

## 2023-08-26 NOTE — Progress Notes (Signed)
Pt returned my call at this time. I wanted to let him know that there was availability for him to have his port placed on 2/20 at 2:30 with a 12:30 arrival time, and his education appt could be rescheduled to 2/21 at 11:00. Pt agreed to both appts. Scheduler Darrold Span changed the pt's appt for his education appt and IR scheduler Denita Lung scheduled his port placement. I will reach out to radiation oncology to reschedule his treatments for those days to accommodate his port and education appts. Pt is aware of the date/time/location of his port appt. I gave him instructions to not eat after midnight and he has to stop fluid intake 6hrs prior. He knows he has to have a driver and someone has to stay with him for 24hrs to monitor him for any potential adverse effects of the anesthesia he receives. Pt knows he can call me with any questions or concerns.

## 2023-08-26 NOTE — Progress Notes (Signed)
The proposed treatment discussed in conference is for discussion purpose only and is not a binding recommendation.  The patients have not been physically examined, or presented with their treatment options.  Therefore, final treatment plans cannot be decided.

## 2023-08-26 NOTE — Progress Notes (Signed)
I was able to reach out to the RTT Support Rep, Joni Reining, and reschedule pt's radiation on 2/20 for 12:20 to take place prior to his port placement. I called the pt after speaking to Joni Reining that he radiation appt could be at 12:20, which is cutting it close to his port placement report time, or 8:30. Pt opted for the 12:20 appt as he would have transportation issues that early in the morning. I asked that he be at the cancer center NLT 12:00 to check in. I called Joni Reining back to let her know the pt will take the 12:20 slot and to check in at 12:00. I also message Denita Lung, IR scheduler, to let her know there is a slight chance he may be a few minutes late. Tiffany acknowledged message.

## 2023-08-26 NOTE — Progress Notes (Signed)
08/25/2023--I met the pt today face to face at his consult with C.Heilingoetter, Onc PA and Dr Arbutus Ped. Pt is accompanied by his niece and nephew. The plan for the patient is concurrent chemoradiation. Pt will be receiving 4 cycles of carbo/etoposide/imfinzi D1, followed by etoposide D2 and D3, with neulasta injection on D5. After 2 cycles, the pt will receive imaging to assess reponse to the treatment. Pt knows he will be receiving a chemotherapy education class to go over his medication, SEs, what to expect, etc. Pt was encouraged to bring 1 or 2 support people with him so they can have information as well. Pt would like a port, I will alert Cassie to let her know to put an order in for that. I went over the port information section of the Journey book to explain how the port functions, how to use the EMLA cream, etc. I provided the pt with a Lung Cancer Journey book, including information about the [facility], nutrition during chemo, and booklets on Small Cell Lung Cancer, Living with Cancer, and radiation therapy. I will also place a nutrition consult for assessment and assistance during his treatment.  Pt has my contact information and I let him know I will schedule his port placement for him.

## 2023-08-27 ENCOUNTER — Other Ambulatory Visit: Payer: Self-pay

## 2023-08-29 ENCOUNTER — Other Ambulatory Visit: Payer: Self-pay

## 2023-08-29 ENCOUNTER — Ambulatory Visit
Admission: RE | Admit: 2023-08-29 | Discharge: 2023-08-29 | Disposition: A | Discharge status: Home / Self Care | Payer: 59 | Source: Ambulatory Visit | Attending: Radiation Oncology | Admitting: Radiation Oncology

## 2023-08-29 DIAGNOSIS — Z79620 Long term (current) use of immunosuppressive biologic: Secondary | ICD-10-CM | POA: Diagnosis not present

## 2023-08-29 DIAGNOSIS — Z79634 Long term (current) use of topoisomerase inhibitor: Secondary | ICD-10-CM | POA: Diagnosis not present

## 2023-08-29 DIAGNOSIS — Z5111 Encounter for antineoplastic chemotherapy: Secondary | ICD-10-CM | POA: Diagnosis not present

## 2023-08-29 DIAGNOSIS — C3412 Malignant neoplasm of upper lobe, left bronchus or lung: Secondary | ICD-10-CM | POA: Diagnosis not present

## 2023-08-29 DIAGNOSIS — Z8551 Personal history of malignant neoplasm of bladder: Secondary | ICD-10-CM | POA: Diagnosis not present

## 2023-08-29 DIAGNOSIS — Z87891 Personal history of nicotine dependence: Secondary | ICD-10-CM | POA: Diagnosis not present

## 2023-08-29 DIAGNOSIS — Z79630 Long term (current) use of alkylating agent: Secondary | ICD-10-CM | POA: Diagnosis not present

## 2023-08-29 DIAGNOSIS — C3411 Malignant neoplasm of upper lobe, right bronchus or lung: Secondary | ICD-10-CM | POA: Diagnosis not present

## 2023-08-29 DIAGNOSIS — Z510 Encounter for antineoplastic radiation therapy: Secondary | ICD-10-CM | POA: Diagnosis not present

## 2023-08-29 DIAGNOSIS — Z5112 Encounter for antineoplastic immunotherapy: Secondary | ICD-10-CM | POA: Diagnosis not present

## 2023-08-29 LAB — RAD ONC ARIA SESSION SUMMARY
Course Elapsed Days: 0
Plan Fractions Treated to Date: 1
Plan Prescribed Dose Per Fraction: 2.5 Gy
Plan Total Fractions Prescribed: 15
Plan Total Prescribed Dose: 37.5 Gy
Reference Point Dosage Given to Date: 2.5 Gy
Reference Point Session Dosage Given: 2.5 Gy
Session Number: 1

## 2023-08-29 NOTE — Progress Notes (Signed)
 Pharmacist Chemotherapy Monitoring - Initial Assessment    Anticipated start date: 09/06/23   The following has been reviewed per standard work regarding the patient's treatment regimen: The patient's diagnosis, treatment plan and drug doses, and organ/hematologic function Lab orders and baseline tests specific to treatment regimen  The treatment plan start date, drug sequencing, and pre-medications Prior authorization status  Patient's documented medication list, including drug-drug interaction screen and prescriptions for anti-emetics and supportive care specific to the treatment regimen The drug concentrations, fluid compatibility, administration routes, and timing of the medications to be used The patient's access for treatment and lifetime cumulative dose history, if applicable  The patient's medication allergies and previous infusion related reactions, if applicable   Changes made to treatment plan:  N/A  Follow up needed:  Pending authorization for treatment  and port placement   Drusilla Kanner, PharmD, MBA

## 2023-08-30 ENCOUNTER — Ambulatory Visit
Admission: RE | Admit: 2023-08-30 | Discharge: 2023-08-30 | Disposition: A | Discharge status: Home / Self Care | Payer: 59 | Source: Ambulatory Visit | Attending: Radiation Oncology | Admitting: Radiation Oncology

## 2023-08-30 ENCOUNTER — Other Ambulatory Visit: Payer: Self-pay

## 2023-08-30 DIAGNOSIS — Z8551 Personal history of malignant neoplasm of bladder: Secondary | ICD-10-CM | POA: Diagnosis not present

## 2023-08-30 DIAGNOSIS — C3412 Malignant neoplasm of upper lobe, left bronchus or lung: Secondary | ICD-10-CM | POA: Diagnosis not present

## 2023-08-30 DIAGNOSIS — Z510 Encounter for antineoplastic radiation therapy: Secondary | ICD-10-CM | POA: Diagnosis not present

## 2023-08-30 DIAGNOSIS — Z79630 Long term (current) use of alkylating agent: Secondary | ICD-10-CM | POA: Diagnosis not present

## 2023-08-30 DIAGNOSIS — Z79620 Long term (current) use of immunosuppressive biologic: Secondary | ICD-10-CM | POA: Diagnosis not present

## 2023-08-30 DIAGNOSIS — Z5111 Encounter for antineoplastic chemotherapy: Secondary | ICD-10-CM | POA: Diagnosis not present

## 2023-08-30 DIAGNOSIS — C3411 Malignant neoplasm of upper lobe, right bronchus or lung: Secondary | ICD-10-CM | POA: Diagnosis not present

## 2023-08-30 DIAGNOSIS — Z79634 Long term (current) use of topoisomerase inhibitor: Secondary | ICD-10-CM | POA: Diagnosis not present

## 2023-08-30 DIAGNOSIS — Z87891 Personal history of nicotine dependence: Secondary | ICD-10-CM | POA: Diagnosis not present

## 2023-08-30 DIAGNOSIS — Z5112 Encounter for antineoplastic immunotherapy: Secondary | ICD-10-CM | POA: Diagnosis not present

## 2023-08-30 LAB — RAD ONC ARIA SESSION SUMMARY
Course Elapsed Days: 1
Plan Fractions Treated to Date: 2
Plan Prescribed Dose Per Fraction: 2.5 Gy
Plan Total Fractions Prescribed: 15
Plan Total Prescribed Dose: 37.5 Gy
Reference Point Dosage Given to Date: 5 Gy
Reference Point Session Dosage Given: 2.5 Gy
Session Number: 2

## 2023-08-31 ENCOUNTER — Other Ambulatory Visit: Payer: Self-pay | Admitting: Radiology

## 2023-08-31 ENCOUNTER — Other Ambulatory Visit: Payer: Self-pay

## 2023-08-31 ENCOUNTER — Ambulatory Visit
Admission: RE | Admit: 2023-08-31 | Discharge: 2023-08-31 | Disposition: A | Discharge status: Home / Self Care | Payer: 59 | Source: Ambulatory Visit | Attending: Radiation Oncology | Admitting: Radiation Oncology

## 2023-08-31 DIAGNOSIS — Z5112 Encounter for antineoplastic immunotherapy: Secondary | ICD-10-CM | POA: Diagnosis not present

## 2023-08-31 DIAGNOSIS — Z510 Encounter for antineoplastic radiation therapy: Secondary | ICD-10-CM | POA: Diagnosis not present

## 2023-08-31 DIAGNOSIS — C3411 Malignant neoplasm of upper lobe, right bronchus or lung: Secondary | ICD-10-CM

## 2023-08-31 DIAGNOSIS — Z87891 Personal history of nicotine dependence: Secondary | ICD-10-CM | POA: Diagnosis not present

## 2023-08-31 DIAGNOSIS — Z79630 Long term (current) use of alkylating agent: Secondary | ICD-10-CM | POA: Diagnosis not present

## 2023-08-31 DIAGNOSIS — Z79634 Long term (current) use of topoisomerase inhibitor: Secondary | ICD-10-CM | POA: Diagnosis not present

## 2023-08-31 DIAGNOSIS — Z5111 Encounter for antineoplastic chemotherapy: Secondary | ICD-10-CM | POA: Diagnosis not present

## 2023-08-31 DIAGNOSIS — Z8551 Personal history of malignant neoplasm of bladder: Secondary | ICD-10-CM | POA: Diagnosis not present

## 2023-08-31 DIAGNOSIS — Z79620 Long term (current) use of immunosuppressive biologic: Secondary | ICD-10-CM | POA: Diagnosis not present

## 2023-08-31 DIAGNOSIS — C3412 Malignant neoplasm of upper lobe, left bronchus or lung: Secondary | ICD-10-CM | POA: Diagnosis not present

## 2023-08-31 LAB — RAD ONC ARIA SESSION SUMMARY
Course Elapsed Days: 2
Plan Fractions Treated to Date: 3
Plan Prescribed Dose Per Fraction: 2.5 Gy
Plan Total Fractions Prescribed: 15
Plan Total Prescribed Dose: 37.5 Gy
Reference Point Dosage Given to Date: 7.5 Gy
Reference Point Session Dosage Given: 2.5 Gy
Session Number: 3

## 2023-08-31 NOTE — H&P (Shared)
Chief Complaint:  lung cancer - image guided port a cath placement   Referring Provider(s): Heilingoetter, Cassandra  Supervising Physician: Richarda Overlie  Patient Status: [facility] - Out-pt  History of Present Illness: John [name] is a 78 y.o. male with a history of bladder cancer, hypertension, and aortic regurgitation.  In October 2024, pt was seen by his primary care for shortness of breath and a CT angiogram was obtained 05/02/23 revealing enlarged lymph nodes and a spiculated lesion.  Following this he underwent a bronchoscopy with Dr. Delton Coombes 08/08/23 revealing FNA cytology was consistent with adenocarcinoma.  Pt was referred to Cassandra Heilingoetter PA-C at the [facility] where she plans to initiate chemotherapy 09/05/23. Pt was referred to interventional radiology for image guided port insertion.    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 at a level above the muscularis mucosa (per path report)   Frequency of urination 02/11/2021   History of prostate cancer 2014   dx 2013--  Stage T1c,  Gleason 3+3,  PSA 6.38,  vol 25.41cc--  s/p  radioactive seed implants 08-01-2012 urologist-  dr Ronne Binning--  last PSA 0.8  in April 2017   Hydrocele    Hypertension 02/11/2021   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   worked up with cardiology dr hochrein and f/u prn   Nocturia 02/11/2021   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   Wears glasses 02/11/2021    Past Surgical History:  Procedure Laterality Date   ABDOMINAL HERNIA REPAIR  1990's   BRONCHIAL BIOPSY  08/08/2023   Procedure: BRONCHIAL BIOPSIES;  Surgeon: Leslye Peer, MD;  Location: [facility] ENDOSCOPY;  Service: Pulmonary;;   BRONCHIAL BRUSHINGS  08/08/2023   Procedure: BRONCHIAL BRUSHINGS;   Surgeon: Leslye Peer, MD;  Location: [facility] ENDOSCOPY;  Service: Pulmonary;;   BRONCHIAL NEEDLE ASPIRATION BIOPSY  08/08/2023   Procedure: BRONCHIAL NEEDLE ASPIRATION BIOPSIES;  Surgeon: Leslye Peer, MD;  Location: [facility] ENDOSCOPY;  Service: Pulmonary;;   CYSTOSCOPY W/ RETROGRADES N/A 11/03/2015   Procedure: CYSTOSCOPY WITH RETROGRADE PYELOGRAM;  Surgeon: Malen Gauze, MD;  Location: [facility];  Service: Urology;  Laterality: N/A;   CYSTOSCOPY W/ RETROGRADES Left 01/09/2016   Procedure: CYSTOSCOPY WITH LEFT RETROGRADE PYELOGRAM;  Surgeon: Malen Gauze, MD;  Location: [facility];  Service: Urology;  Laterality: Left;   CYSTOSCOPY W/ RETROGRADES Bilateral 05/28/2016   Procedure: CYSTOSCOPY WITH RETROGRADE PYELOGRAM;  Surgeon: Malen Gauze, MD;  Location: [facility];  Service: Urology;  Laterality: Bilateral;   CYSTOSCOPY W/ RETROGRADES Bilateral 02/17/2021   Procedure: CYSTOSCOPY WITH RETROGRADE PYELOGRAM;  Surgeon: Belva Agee, MD;  Location: [facility];  Service: Urology;  Laterality: Bilateral;   CYSTOSCOPY W/ URETERAL STENT REMOVAL Left 01/09/2016   Procedure: CYSTOSCOPY WITH LEFT STENT EXCHANGE;  Surgeon: Malen Gauze, MD;  Location: [facility];  Service: Urology;  Laterality: Left;   CYSTOSCOPY WITH BIOPSY N/A 05/28/2016   Procedure: CYSTOSCOPY WITH BIOPSY;  Surgeon: Malen Gauze, MD;  Location: [facility];  Service: Urology;  Laterality: N/A;   CYSTOSCOPY WITH URETHRAL DILATATION N/A 05/28/2016   Procedure: CYSTOSCOPY WITH URETHRAL DILATATION;  Surgeon: Malen Gauze, MD;  Location: [facility];  Service: Urology;  Laterality: N/A;   HEMOSTASIS CONTROL  08/08/2023   Procedure: HEMOSTASIS CONTROL;  Surgeon: Leslye Peer, MD;  Location: [facility] ENDOSCOPY;  Service: Pulmonary;;   PROSTATE BIOPSY  05/08/12   Adenocarcinoma   RADIOACTIVE SEED IMPLANT   08/01/2012   Procedure: RADIOACTIVE SEED IMPLANT;  Surgeon: Lindaann Slough, MD;  Location:  SURGERY CENTER;  Service: Urology;  Laterality: N/A;  73 seeds implanted no seeds found in bladder   TRANSURETHRAL RESECTION OF BLADDER TUMOR N/A 11/03/2015   Procedure: TRANSURETHRAL RESECTION OF BLADDER TUMOR (TURBT)WITH INSERTION STENT;  Surgeon: Malen Gauze, MD;  Location: [facility];  Service: Urology;  Laterality: N/A;   TRANSURETHRAL RESECTION OF BLADDER TUMOR N/A 02/17/2021   Procedure: TRANSURETHRAL RESECTION OF BLADDER TUMOR (TURBT);  Surgeon: Belva Agee, MD;  Location: [facility];  Service: Urology;  Laterality: N/A;  30 MINS   TRANSURETHRAL RESECTION OF BLADDER TUMOR WITH GYRUS (TURBT-GYRUS) N/A 01/09/2016   Procedure: TRANSURETHRAL RESECTION OF BLADDER TUMOR WITH GYRUS (TURBT-GYRUS);  Surgeon: Malen Gauze, MD;  Location: [facility];  Service: Urology;  Laterality: N/A;   VIDEO BRONCHOSCOPY WITH RADIAL ENDOBRONCHIAL ULTRASOUND  08/08/2023   Procedure: VIDEO BRONCHOSCOPY WITH RADIAL ENDOBRONCHIAL ULTRASOUND;  Surgeon: Leslye Peer, MD;  Location: [facility] ENDOSCOPY;  Service: Pulmonary;;    Allergies: Patient has no known allergies.  Medications: Prior to Admission medications   Medication Sig Start Date End Date Taking? Authorizing Provider  albuterol (VENTOLIN HFA) 108 (90 Base) MCG/ACT inhaler Inhale 1-2 puffs into the lungs every 6 (six) hours as needed for wheezing or shortness of breath. 08/16/23   Bevelyn Ngo, NP  amLODipine (NORVASC) 10 MG tablet Take 10 mg by mouth daily.    [provider]  COMIRNATY syringe  04/17/23   [provider]  FLUZONE HIGH-DOSE 0.5 ML injection  04/17/23   [provider]  lidocaine-prilocaine (EMLA) cream Apply 1 Application topically as needed. 08/25/23   Heilingoetter, Cassandra L, PA-C  prochlorperazine (COMPAZINE) 10 MG tablet Take 1 tablet (10 mg total)  by mouth every 6 (six) hours as needed. 08/25/23   Heilingoetter, Cassandra L, PA-C     Family History  Problem Relation Age of Onset   Hyperlipidemia Mother    Asthma Mother    Heart failure Mother    Aneurysm Father    Hyperlipidemia Sister    Asthma Sister     Social History   Socioeconomic History   Marital status: Single    Spouse name: Not on file   Number of children: Not on file   Years of education: Not on file   Highest education level: Not on file  Occupational History   Not on file  Tobacco Use   Smoking status: Former    Current packs/day: 0.00    Average packs/day: 0.5 packs/day for 42.0 years (21.0 ttl pk-yrs)    Types: Cigarettes    Start date: 10/28/1972    Quit date: 10/29/2014    Years since quitting: 8.8   Smokeless tobacco: Never  Vaping Use   Vaping status: Never Used  Substance and Sexual Activity   Alcohol use: Yes    Comment: occasionally blended whiskey    Drug use: Yes    Types: Marijuana   Sexual activity: Not on file  Other Topics Concern   Not on file  Social History Narrative   Retired.  Lives alone.  Social Drivers of Corporate investment banker Strain: Not on file  Food Insecurity: No Food Insecurity (08/18/2023)   Hunger Vital Sign    Worried About Running Out of Food in the Last Year: Never true    Ran Out of Food in the Last Year: Never true  Transportation Needs: No Transportation Needs (08/18/2023)   PRAPARE - Administrator, Civil Service (Medical): No    Lack of Transportation (Non-Medical): No  Physical Activity: Not on file  Stress: Not on file  Social Connections: Not on file     Review of Systems: A 12 point ROS discussed and pertinent positives are indicated in the HPI above.  All other systems are negative.  Review of Systems  Vital Signs: There were no vitals taken for this visit.  Advance Care Plan: The advanced care place/surrogate decision maker was discussed at the time of visit and the  patient did not wish to discuss or was not able to name a surrogate decision maker or provide an advance care plan.  Physical Exam  Imaging: NM PET Image Initial (PI) Skull Base To Thigh Result Date: 08/24/2023 CLINICAL DATA:  Initial treatment strategy for non-small cell lung cancer. EXAM: NUCLEAR MEDICINE PET SKULL BASE TO THIGH TECHNIQUE: 7.52 mCi F-18 FDG was injected intravenously. Full-ring PET imaging was performed from the skull base to thigh after the radiotracer. CT data was obtained and used for attenuation correction and anatomic localization. Fasting blood glucose: 113 mg/dl COMPARISON:  CT chest 16/04/9603 FINDINGS: Mediastinal blood pool activity: SUV max 1.51 Liver activity: SUV max NA NECK: No hypermetabolic lymph nodes in the neck. Incidental CT findings: None. CHEST: There is a large left hilar/mediastinal mass which is centered around the AP window region and extending into the left paratracheal region and left pre vascular regions. This is diffusely FDG avid. The mass measures 9.4 x 6.7 cm and has an SUV max of 10.46, image 77/4. Within the superior mediastinum there is a pre-vascular node measuring 1 cm with SUV max of 4.84. Left lower lobe subpleural nodule abutting the scratch set subpleural nodule adjacent to the posterior arch of the thoracic aorta measures 2 cm with SUV max of 6.91, image 79/4. Within the anterior left upper lobe there is a subpleural nodule measuring 1.1 cm with SUV max of 4.92. Ill-defined nodule in the posterior right upper lobe measures 2 cm with SUV max of 6.27. Adjacent nodule measures 1.5 cm with SUV max of 2.34. Incidental CT findings: Aortic atherosclerosis. Coronary artery calcifications. Heart size is enlarged. No pericardial or pleural effusion. ABDOMEN/PELVIS: No abnormal hypermetabolic activity within the liver, pancreas, adrenal glands, or spleen. No hypermetabolic lymph nodes in the abdomen or pelvis. Incidental CT findings: Aortic atherosclerosis.  Seed implants noted within the prostate gland. SKELETON: No tracer avid osseous metastatic disease. MULTIPLE SUBCUTANEOUS NODULES APPEAR TRACER AVID.  FOR EXAMPLE: MULTIPLE SUBCUTANEOUS NODULES APPEAR TRACER AVID.  FOR EXAMPLE -Within the paramidline right ventral chest wall there is a 1.5 cm nodule with SUV max of 4.25, image 82/4. -Within the right lateral chest wall there is a subcutaneous nodule measuring 1.3 cm with SUV max of 3.99. -Left lateral chest wall subcutaneous nodule measures 0.7 cm with SUV max of 1.67, image 75/4. Incidental CT findings: None. IMPRESSION: 1. There is a large left hilar/mediastinal mass which is diffusely FDG avid and compatible with primary bronchogenic carcinoma. 2. There are multiple tracer avid bilateral pulmonary nodules compatible with metastatic disease. 3. There is a  tracer avid pre-vascular lymph node compatible with metastatic disease. 4. Multiple tracer avid subcutaneous nodules compatible with metastatic disease. 5.  Aortic Atherosclerosis (ICD10-I70.0). Electronically Signed   By: Signa Kell M.D.   On: 08/24/2023 17:41   MR BRAIN W WO CONTRAST Result Date: 08/22/2023 CLINICAL DATA:  Provided history: Malignant neoplasm of unspecified part of unspecified bronchus or lung. Non-small cell lung cancer, staging. EXAM: MRI HEAD WITHOUT AND WITH CONTRAST TECHNIQUE: Multiplanar, multiecho pulse sequences of the brain and surrounding structures were obtained without and with intravenous contrast. CONTRAST:  6mL GADAVIST GADOBUTROL 1 MMOL/ML IV SOLN COMPARISON:  None. FINDINGS: Brain: Mild-to-moderate generalized parenchymal atrophy. Multifocal T2 FLAIR hyperintense signal abnormality within the cerebral white matter and pons, nonspecific but compatible with minimal chronic small vessel ischemic disease. No cortical encephalomalacia identified. There is no acute infarct. No evidence of an intracranial mass. No chronic intracranial blood products. No extra-axial fluid  collection. No midline shift. No pathologic intracranial enhancement is identified. Vascular: Maintained flow voids within the proximal large arterial vessels. Skull and upper cervical spine: No focal worrisome marrow lesion. Incompletely assessed cervical spondylosis. Sinuses/orbits: No orbital mass or acute orbital finding.Minimal mucosal thickening within the bilateral ethmoid and left sphenoid sinuses. Small mucous retention cyst within the right maxillary sinus. IMPRESSION: 1. No evidence of intracranial metastatic disease. 2. Minimal chronic small vessel ischemic changes within the cerebral white matter and pons. 3. Mild-to-moderate generalized parenchymal atrophy. 4. Paranasal sinus disease as described. Electronically Signed   By: Jackey Loge D.O.   On: 08/22/2023 09:32   CT Super D Chest Wo Contrast Result Date: 08/10/2023 CLINICAL DATA:  Shortness of breath. Bladder and prostate cancer, lung mass. * Tracking Code: BO * EXAM: CT CHEST WITHOUT CONTRAST TECHNIQUE: Multidetector CT imaging of the chest was performed using thin slice collimation for electromagnetic bronchoscopy planning purposes, without intravenous contrast. RADIATION DOSE REDUCTION: This exam was performed according to the departmental dose-optimization program which includes automated exposure control, adjustment of the mA and/or kV according to patient size and/or use of iterative reconstruction technique. COMPARISON:  05/05/2023. FINDINGS: Cardiovascular: Atherosclerotic calcification of the aorta, aortic valve and coronary arteries. Heart is at the upper limits of normal in size to mildly enlarged. No pericardial effusion. Enlarged pulmonic trunk. Mediastinum/Nodes: Enlarging AP window mass, measuring approximately 6.7 x 9.2 cm, previously 6.1 x 7.4 cm. Hilar regions are difficult to definitively evaluate without IV contrast. No axillary adenopathy. Air in the esophagus can be seen with dysmotility. Lungs/Pleura: Centrilobular and  paraseptal emphysema. Spiculated nodule in the posterior segment right upper lobe measures 2.3 x 2.6 cm (5/65), enlarged from 1.8 x 2.4 cm on 05/05/2023. Peripheral and basilar predominant subpleural ground-glass, traction bronchiectasis/bronchiolectasis and subpleural reticular densities. Obstruction of the left upper lobe bronchus with narrowing of the lingular bronchus. No pleural fluid. Debris in the airway. Upper Abdomen: Visualized portions of the liver, gallbladder, adrenal glands, kidneys, spleen, pancreas, stomach and bowel are grossly unremarkable. No upper abdominal adenopathy. Musculoskeletal: Degenerative changes in the spine. IMPRESSION: 1. Progressive stage IV lung cancer as evidenced by and enlarging AP window mass and enlarging spiculated right upper lobe nodule. 2. 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 3. Aortic atherosclerosis (ICD10-I70.0). Coronary artery calcification. 4. Enlarged pulmonic trunk, indicative of pulmonary arterial hypertension. 5.  Emphysema (ICD10-J43.9). Electronically Signed   By: Leanna Battles M.D.   On: 08/10/2023 14:26   DG Chest Port 1 View Result Date: 08/08/2023 CLINICAL DATA:  Status post bronchoscopy with biopsy. EXAM: PORTABLE  CHEST 1 VIEW COMPARISON:  Chest CT 08/03/2023 FINDINGS: Fullness in left mediastinum compatible with known mass. Patchy densities in right suprahilar region also compatible with known lesion on prior CT. No evidence for pneumothorax. Heart size is stable. IMPRESSION: 1. No evidence for pneumothorax. 2. Known chest lesions. Electronically Signed   By: Richarda Overlie M.D.   On: 08/08/2023 14:14   DG C-ARM BRONCHOSCOPY Result Date: 08/08/2023 C-ARM BRONCHOSCOPY: Fluoroscopy was utilized by the requesting physician.  No radiographic interpretation.    Labs:  CBC: Recent Labs    08/08/23 0735 08/25/23 1251  WBC 8.9 10.7*  HGB 12.7* 13.3  HCT 38.7* 40.4  PLT 210 262    COAGS: No results for input(s):  "INR", "APTT" in the last 8760 hours.  BMP: Recent Labs    08/08/23 0735 08/25/23 1251  NA 138 141  K 3.8 3.7  CL 106 105  CO2 23 32  GLUCOSE 95 109*  BUN 16 10  CALCIUM 9.0 9.5  CREATININE 1.12 1.12  GFRNONAA >60 >60    LIVER FUNCTION TESTS: Recent Labs    08/25/23 1251  BILITOT 0.6  AST 17  ALT 16  ALKPHOS 63  PROT 7.1  ALBUMIN 3.9    TUMOR MARKERS: No results for input(s): "AFPTM", "CEA", "CA199", "CHROMGRNA" in the last 8760 hours.  Assessment and Plan:  Pt with lung adenocarcinoma diagnosis starting chemotherapy on 09/05/23 scheduled for image guided port a catheter placement 09/01/23.    Risks and benefits of image guided port-a-catheter placement was discussed with the patient including, but not limited to bleeding, infection, pneumothorax, or fibrin sheath development and need for additional procedures.  All of the patient's questions were answered, patient is agreeable to proceed. Consent signed and in chart.  Thank you for allowing our service to participate in NEIKO TRIVEDI 's care.  Electronically Signed: Loman Brooklyn, PA-C   08/31/2023, 4:45 PM    I spent a total of  30 Minutes   in face to face in clinical consultation, greater than 50% of which was counseling/coordinating care for image guided port a catheter placement.

## 2023-09-01 ENCOUNTER — Inpatient Hospital Stay (HOSPITAL_COMMUNITY)
Admission: RE | Admit: 2023-09-01 | Discharge: 2023-09-01 | Disposition: A | Discharge status: Home / Self Care | Payer: 59 | Source: Ambulatory Visit | Attending: Physician Assistant | Admitting: Physician Assistant

## 2023-09-01 ENCOUNTER — Ambulatory Visit: Payer: 59

## 2023-09-01 ENCOUNTER — Other Ambulatory Visit: Payer: 59

## 2023-09-01 ENCOUNTER — Ambulatory Visit (HOSPITAL_COMMUNITY): Payer: 59

## 2023-09-01 NOTE — Progress Notes (Signed)
 Pt left me a VM this morning, however I was unable to make out what he was saying. I called him back to assess his navigation needs. Pt states he had to reschedule his port to 3/10  and hoped it was okay. I told him that was fine, he can get his first infusion via PIV. I also requested a weekly lab appt be added to 3/4 as weekly labs are a part of his regimen. Patricia Nettle notified via [facility]. Message acknowledged and pt scheduled.

## 2023-09-02 ENCOUNTER — Other Ambulatory Visit: Payer: Self-pay

## 2023-09-02 ENCOUNTER — Ambulatory Visit
Admission: RE | Admit: 2023-09-02 | Discharge: 2023-09-02 | Disposition: A | Discharge status: Home / Self Care | Payer: 59 | Source: Ambulatory Visit | Attending: Radiation Oncology | Admitting: Radiation Oncology

## 2023-09-02 ENCOUNTER — Inpatient Hospital Stay: Payer: 59

## 2023-09-02 DIAGNOSIS — C3412 Malignant neoplasm of upper lobe, left bronchus or lung: Secondary | ICD-10-CM

## 2023-09-02 DIAGNOSIS — Z5111 Encounter for antineoplastic chemotherapy: Secondary | ICD-10-CM | POA: Diagnosis not present

## 2023-09-02 DIAGNOSIS — Z79634 Long term (current) use of topoisomerase inhibitor: Secondary | ICD-10-CM | POA: Diagnosis not present

## 2023-09-02 DIAGNOSIS — C3411 Malignant neoplasm of upper lobe, right bronchus or lung: Secondary | ICD-10-CM | POA: Diagnosis not present

## 2023-09-02 DIAGNOSIS — Z5112 Encounter for antineoplastic immunotherapy: Secondary | ICD-10-CM | POA: Diagnosis not present

## 2023-09-02 DIAGNOSIS — Z510 Encounter for antineoplastic radiation therapy: Secondary | ICD-10-CM | POA: Diagnosis not present

## 2023-09-02 DIAGNOSIS — Z79630 Long term (current) use of alkylating agent: Secondary | ICD-10-CM | POA: Diagnosis not present

## 2023-09-02 DIAGNOSIS — Z79620 Long term (current) use of immunosuppressive biologic: Secondary | ICD-10-CM | POA: Diagnosis not present

## 2023-09-02 DIAGNOSIS — Z8551 Personal history of malignant neoplasm of bladder: Secondary | ICD-10-CM | POA: Diagnosis not present

## 2023-09-02 DIAGNOSIS — Z87891 Personal history of nicotine dependence: Secondary | ICD-10-CM | POA: Diagnosis not present

## 2023-09-02 LAB — RAD ONC ARIA SESSION SUMMARY
Course Elapsed Days: 4
Plan Fractions Treated to Date: 4
Plan Prescribed Dose Per Fraction: 2.5 Gy
Plan Total Fractions Prescribed: 15
Plan Total Prescribed Dose: 37.5 Gy
Reference Point Dosage Given to Date: 10 Gy
Reference Point Session Dosage Given: 2.5 Gy
Session Number: 4

## 2023-09-02 MED ORDER — SONAFINE EX EMUL
1.0000 | Freq: Once | CUTANEOUS | Status: AC
  Administered 2023-09-02: 1 via TOPICAL

## 2023-09-02 NOTE — Progress Notes (Deleted)
 [facility] OFFICE PROGRESS NOTE  Practice, [facility] [address] [place] [address]  DIAGNOSIS:  1) Extensive stage small cell lung cancer.  Presented with a large left hilar/mediastinal mass, multiple bilateral pulmonary nodules, prevascular lymph node, and subcutaneous nodules consistent with metastatic disease.  He was diagnosed in January 2025. 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  First dose on  INTERVAL HISTORY: John [name] 78 y.o. 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  MEDICATIONS:  Current Outpatient Medications  Medication Sig Dispense Refill   albuterol (VENTOLIN HFA) 108 (90 Base) MCG/ACT inhaler Inhale 1-2 puffs into the lungs every 6 (six) hours as needed for wheezing or  shortness of breath. 8 g 2   amLODipine (NORVASC) 10 MG tablet Take 10 mg by mouth daily.     COMIRNATY syringe      FLUZONE HIGH-DOSE 0.5 ML injection      lidocaine-prilocaine (EMLA) cream Apply 1 Application topically as needed. 30 g 2   prochlorperazine (COMPAZINE) 10 MG tablet Take 1 tablet (10 mg total) by mouth every 6 (six) hours as needed. 30 tablet 2   No current facility-administered medications for this visit.    SURGICAL HISTORY:  Past Surgical History:  Procedure Laterality Date   ABDOMINAL HERNIA REPAIR  1990's   BRONCHIAL BIOPSY  08/08/2023   Procedure: BRONCHIAL BIOPSIES;  Surgeon: Leslye Peer, MD;  Location: [facility] ENDOSCOPY;  Service: Pulmonary;;   BRONCHIAL BRUSHINGS  08/08/2023   Procedure: BRONCHIAL BRUSHINGS;  Surgeon: Leslye Peer, MD;  Location: [facility] ENDOSCOPY;  Service: Pulmonary;;   BRONCHIAL NEEDLE ASPIRATION BIOPSY  08/08/2023   Procedure: BRONCHIAL NEEDLE ASPIRATION BIOPSIES;  Surgeon: Leslye Peer, MD;  Location: [facility] ENDOSCOPY;  Service: Pulmonary;;   CYSTOSCOPY W/ RETROGRADES N/A 11/03/2015   Procedure: CYSTOSCOPY WITH RETROGRADE PYELOGRAM;  Surgeon: Malen Gauze, MD;  Location: [facility];  Service: Urology;  Laterality: N/A;   CYSTOSCOPY W/ RETROGRADES Left 01/09/2016   Procedure: CYSTOSCOPY WITH LEFT RETROGRADE PYELOGRAM;  Surgeon: Malen Gauze, MD;  Location: [facility];  Service: Urology;  Laterality: Left;   CYSTOSCOPY W/ RETROGRADES Bilateral 05/28/2016   Procedure: CYSTOSCOPY WITH  RETROGRADE PYELOGRAM;  Surgeon: Malen Gauze, MD;  Location: [facility];  Service: Urology;  Laterality: Bilateral;   CYSTOSCOPY W/ RETROGRADES Bilateral 02/17/2021   Procedure: CYSTOSCOPY WITH RETROGRADE PYELOGRAM;  Surgeon: Belva Agee, MD;  Location: [facility];  Service: Urology;  Laterality: Bilateral;   CYSTOSCOPY W/ URETERAL STENT REMOVAL Left 01/09/2016   Procedure: CYSTOSCOPY WITH  LEFT STENT EXCHANGE;  Surgeon: Malen Gauze, MD;  Location: [facility];  Service: Urology;  Laterality: Left;   CYSTOSCOPY WITH BIOPSY N/A 05/28/2016   Procedure: CYSTOSCOPY WITH BIOPSY;  Surgeon: Malen Gauze, MD;  Location: [facility];  Service: Urology;  Laterality: N/A;   CYSTOSCOPY WITH URETHRAL DILATATION N/A 05/28/2016   Procedure: CYSTOSCOPY WITH URETHRAL DILATATION;  Surgeon: Malen Gauze, MD;  Location: [facility];  Service: Urology;  Laterality: N/A;   HEMOSTASIS CONTROL  08/08/2023   Procedure: HEMOSTASIS CONTROL;  Surgeon: Leslye Peer, MD;  Location: [facility] ENDOSCOPY;  Service: Pulmonary;;   PROSTATE BIOPSY  05/08/12   Adenocarcinoma   RADIOACTIVE SEED IMPLANT  08/01/2012   Procedure: RADIOACTIVE SEED IMPLANT;  Surgeon: Lindaann Slough, MD;  Location: [facility];  Service: Urology;  Laterality: N/A;  73 seeds implanted no seeds found in bladder   TRANSURETHRAL RESECTION OF BLADDER TUMOR N/A 11/03/2015   Procedure: TRANSURETHRAL RESECTION OF BLADDER TUMOR (TURBT)WITH INSERTION STENT;  Surgeon: Malen Gauze, MD;  Location: [facility];  Service: Urology;  Laterality: N/A;   TRANSURETHRAL RESECTION OF BLADDER TUMOR N/A 02/17/2021   Procedure: TRANSURETHRAL RESECTION OF BLADDER TUMOR (TURBT);  Surgeon: Belva Agee, MD;  Location: [facility];  Service: Urology;  Laterality: N/A;  30 MINS   TRANSURETHRAL RESECTION OF BLADDER TUMOR WITH GYRUS (TURBT-GYRUS) N/A 01/09/2016   Procedure: TRANSURETHRAL RESECTION OF BLADDER TUMOR WITH GYRUS (TURBT-GYRUS);  Surgeon: Malen Gauze, MD;  Location: [facility];  Service: Urology;  Laterality: N/A;   VIDEO BRONCHOSCOPY WITH RADIAL ENDOBRONCHIAL ULTRASOUND  08/08/2023   Procedure: VIDEO BRONCHOSCOPY WITH RADIAL ENDOBRONCHIAL ULTRASOUND;  Surgeon: Leslye Peer, MD;  Location: [facility] ENDOSCOPY;  Service: Pulmonary;;     REVIEW OF SYSTEMS:   Review of Systems  Constitutional: Negative for appetite change, chills, fatigue, fever and unexpected weight change.  HENT:   Negative for mouth sores, nosebleeds, sore throat and trouble swallowing.   Eyes: Negative for eye problems and icterus.  Respiratory: Negative for cough, hemoptysis, shortness of breath and wheezing.   Cardiovascular: Negative for chest pain and leg swelling.  Gastrointestinal: Negative for abdominal pain, constipation, diarrhea, nausea and vomiting.  Genitourinary: Negative for bladder incontinence, difficulty urinating, dysuria, frequency and hematuria.   Musculoskeletal: Negative for back pain, gait problem, neck pain and neck stiffness.  Skin: Negative for itching and rash.  Neurological: Negative for dizziness, extremity weakness, gait problem, headaches, light-headedness and seizures.  Hematological: Negative for adenopathy. Does not bruise/bleed easily.  Psychiatric/Behavioral: Negative for confusion, depression and sleep disturbance. The patient is not nervous/anxious.     PHYSICAL EXAMINATION:  There were no vitals taken for this visit.  ECOG PERFORMANCE STATUS: {CHL ONC ECOG [id number]  Physical Exam  Constitutional: Oriented to person, place, and time and well-developed, well-nourished, and in no distress. No distress.  HENT:  Head: Normocephalic and atraumatic.  Mouth/Throat: Oropharynx is clear and moist. No oropharyngeal exudate.  Eyes: Conjunctivae are normal. Right eye exhibits no discharge. Left eye exhibits no discharge. No scleral  icterus.  Neck: Normal range of motion. Neck supple.  Cardiovascular: Normal rate, regular rhythm, normal heart sounds and intact distal pulses.   Pulmonary/Chest: Effort normal and breath sounds normal. No respiratory distress. No wheezes. No rales.  Abdominal: Soft. Bowel sounds are normal. Exhibits no distension and no mass. There is no tenderness.  Musculoskeletal: Normal range of  motion. Exhibits no edema.  Lymphadenopathy:    No cervical adenopathy.  Neurological: Alert and oriented to person, place, and time. Exhibits normal muscle tone. Gait normal. Coordination normal.  Skin: Skin is warm and dry. No rash noted. Not diaphoretic. No erythema. No pallor.  Psychiatric: Mood, memory and judgment normal.  Vitals reviewed.  LABORATORY DATA: Lab Results  Component Value Date   WBC 10.7 (H) 08/25/2023   HGB 13.3 08/25/2023   HCT 40.4 08/25/2023   MCV 96.9 08/25/2023   PLT 262 08/25/2023      Chemistry      Component Value Date/Time   NA 141 08/25/2023 1251   K 3.7 08/25/2023 1251   CL 105 08/25/2023 1251   CO2 32 08/25/2023 1251   BUN 10 08/25/2023 1251   CREATININE 1.12 08/25/2023 1251      Component Value Date/Time   CALCIUM 9.5 08/25/2023 1251   ALKPHOS 63 08/25/2023 1251   AST 17 08/25/2023 1251   ALT 16 08/25/2023 1251   BILITOT 0.6 08/25/2023 1251       RADIOGRAPHIC STUDIES:  NM PET Image Initial (PI) Skull Base To Thigh Result Date: 08/24/2023 CLINICAL DATA:  Initial treatment strategy for non-small cell lung cancer. EXAM: NUCLEAR MEDICINE PET SKULL BASE TO THIGH TECHNIQUE: 7.52 mCi F-18 FDG was injected intravenously. Full-ring PET imaging was performed from the skull base to thigh after the radiotracer. CT data was obtained and used for attenuation correction and anatomic localization. Fasting blood glucose: 113 mg/dl COMPARISON:  CT chest 88/41/6606 FINDINGS: Mediastinal blood pool activity: SUV max 1.51 Liver activity: SUV max NA NECK: No hypermetabolic lymph nodes in the neck. Incidental CT findings: None. CHEST: There is a large left hilar/mediastinal mass which is centered around the AP window region and extending into the left paratracheal region and left pre vascular regions. This is diffusely FDG avid. The mass measures 9.4 x 6.7 cm and has an SUV max of 10.46, image 77/4. Within the superior mediastinum there is a pre-vascular node  measuring 1 cm with SUV max of 4.84. Left lower lobe subpleural nodule abutting the scratch set subpleural nodule adjacent to the posterior arch of the thoracic aorta measures 2 cm with SUV max of 6.91, image 79/4. Within the anterior left upper lobe there is a subpleural nodule measuring 1.1 cm with SUV max of 4.92. Ill-defined nodule in the posterior right upper lobe measures 2 cm with SUV max of 6.27. Adjacent nodule measures 1.5 cm with SUV max of 2.34. Incidental CT findings: Aortic atherosclerosis. Coronary artery calcifications. Heart size is enlarged. No pericardial or pleural effusion. ABDOMEN/PELVIS: No abnormal hypermetabolic activity within the liver, pancreas, adrenal glands, or spleen. No hypermetabolic lymph nodes in the abdomen or pelvis. Incidental CT findings: Aortic atherosclerosis. Seed implants noted within the prostate gland. SKELETON: No tracer avid osseous metastatic disease. MULTIPLE SUBCUTANEOUS NODULES APPEAR TRACER AVID.  FOR EXAMPLE: MULTIPLE SUBCUTANEOUS NODULES APPEAR TRACER AVID.  FOR EXAMPLE -Within the paramidline right ventral chest wall there is a 1.5 cm nodule with SUV max of 4.25, image 82/4. -Within the right lateral chest wall there is a subcutaneous nodule measuring 1.3  cm with SUV max of 3.99. -Left lateral chest wall subcutaneous nodule measures 0.7 cm with SUV max of 1.67, image 75/4. Incidental CT findings: None. IMPRESSION: 1. There is a large left hilar/mediastinal mass which is diffusely FDG avid and compatible with primary bronchogenic carcinoma. 2. There are multiple tracer avid bilateral pulmonary nodules compatible with metastatic disease. 3. There is a tracer avid pre-vascular lymph node compatible with metastatic disease. 4. Multiple tracer avid subcutaneous nodules compatible with metastatic disease. 5.  Aortic Atherosclerosis (ICD10-I70.0). Electronically Signed   By: Signa Kell M.D.   On: 08/24/2023 17:41   MR BRAIN W WO CONTRAST Result Date:  08/22/2023 CLINICAL DATA:  Provided history: Malignant neoplasm of unspecified part of unspecified bronchus or lung. Non-small cell lung cancer, staging. EXAM: MRI HEAD WITHOUT AND WITH CONTRAST TECHNIQUE: Multiplanar, multiecho pulse sequences of the brain and surrounding structures were obtained without and with intravenous contrast. CONTRAST:  6mL GADAVIST GADOBUTROL 1 MMOL/ML IV SOLN COMPARISON:  None. FINDINGS: Brain: Mild-to-moderate generalized parenchymal atrophy. Multifocal T2 FLAIR hyperintense signal abnormality within the cerebral white matter and pons, nonspecific but compatible with minimal chronic small vessel ischemic disease. No cortical encephalomalacia identified. There is no acute infarct. No evidence of an intracranial mass. No chronic intracranial blood products. No extra-axial fluid collection. No midline shift. No pathologic intracranial enhancement is identified. Vascular: Maintained flow voids within the proximal large arterial vessels. Skull and upper cervical spine: No focal worrisome marrow lesion. Incompletely assessed cervical spondylosis. Sinuses/orbits: No orbital mass or acute orbital finding.Minimal mucosal thickening within the bilateral ethmoid and left sphenoid sinuses. Small mucous retention cyst within the right maxillary sinus. IMPRESSION: 1. No evidence of intracranial metastatic disease. 2. Minimal chronic small vessel ischemic changes within the cerebral white matter and pons. 3. Mild-to-moderate generalized parenchymal atrophy. 4. Paranasal sinus disease as described. Electronically Signed   By: Jackey Loge D.O.   On: 08/22/2023 09:32   CT Super D Chest Wo Contrast Result Date: 08/10/2023 CLINICAL DATA:  Shortness of breath. Bladder and prostate cancer, lung mass. * Tracking Code: BO * EXAM: CT CHEST WITHOUT CONTRAST TECHNIQUE: Multidetector CT imaging of the chest was performed using thin slice collimation for electromagnetic bronchoscopy planning purposes, without  intravenous contrast. RADIATION DOSE REDUCTION: This exam was performed according to the departmental dose-optimization program which includes automated exposure control, adjustment of the mA and/or kV according to patient size and/or use of iterative reconstruction technique. COMPARISON:  05/05/2023. FINDINGS: Cardiovascular: Atherosclerotic calcification of the aorta, aortic valve and coronary arteries. Heart is at the upper limits of normal in size to mildly enlarged. No pericardial effusion. Enlarged pulmonic trunk. Mediastinum/Nodes: Enlarging AP window mass, measuring approximately 6.7 x 9.2 cm, previously 6.1 x 7.4 cm. Hilar regions are difficult to definitively evaluate without IV contrast. No axillary adenopathy. Air in the esophagus can be seen with dysmotility. Lungs/Pleura: Centrilobular and paraseptal emphysema. Spiculated nodule in the posterior segment right upper lobe measures 2.3 x 2.6 cm (5/65), enlarged from 1.8 x 2.4 cm on 05/05/2023. Peripheral and basilar predominant subpleural ground-glass, traction bronchiectasis/bronchiolectasis and subpleural reticular densities. Obstruction of the left upper lobe bronchus with narrowing of the lingular bronchus. No pleural fluid. Debris in the airway. Upper Abdomen: Visualized portions of the liver, gallbladder, adrenal glands, kidneys, spleen, pancreas, stomach and bowel are grossly unremarkable. No upper abdominal adenopathy. Musculoskeletal: Degenerative changes in the spine. IMPRESSION: 1. Progressive stage IV lung cancer as evidenced by and enlarging AP window mass and enlarging spiculated right upper lobe  nodule. 2. 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 3. Aortic atherosclerosis (ICD10-I70.0). Coronary artery calcification. 4. Enlarged pulmonic trunk, indicative of pulmonary arterial hypertension. 5.  Emphysema (ICD10-J43.9). Electronically Signed   By: Leanna Battles M.D.   On: 08/10/2023 14:26   DG Chest Port 1  View Result Date: 08/08/2023 CLINICAL DATA:  Status post bronchoscopy with biopsy. EXAM: PORTABLE CHEST 1 VIEW COMPARISON:  Chest CT 08/03/2023 FINDINGS: Fullness in left mediastinum compatible with known mass. Patchy densities in right suprahilar region also compatible with known lesion on prior CT. No evidence for pneumothorax. Heart size is stable. IMPRESSION: 1. No evidence for pneumothorax. 2. Known chest lesions. Electronically Signed   By: Richarda Overlie M.D.   On: 08/08/2023 14:14   DG C-ARM BRONCHOSCOPY Result Date: 08/08/2023 C-ARM BRONCHOSCOPY: Fluoroscopy was utilized by the requesting physician.  No radiographic interpretation.     ASSESSMENT/PLAN:  No problem-specific Assessment & Plan notes found for this encounter.   No orders of the defined types were placed in this encounter.    I spent {CHL ONC TIME VISIT - WUJWJ:[id number]} counseling the patient face to face. The total time spent in the appointment was {CHL ONC TIME VISIT - OZHYQ:[id number]}.  Llewyn Heap L Xitlaly Ault, PA-C 09/02/23

## 2023-09-05 ENCOUNTER — Ambulatory Visit
Admission: RE | Admit: 2023-09-05 | Discharge: 2023-09-05 | Disposition: A | Discharge status: Home / Self Care | Payer: 59 | Source: Ambulatory Visit | Attending: Radiation Oncology | Admitting: Radiation Oncology

## 2023-09-05 ENCOUNTER — Other Ambulatory Visit: Payer: Self-pay

## 2023-09-05 DIAGNOSIS — Z5112 Encounter for antineoplastic immunotherapy: Secondary | ICD-10-CM | POA: Diagnosis not present

## 2023-09-05 DIAGNOSIS — C3411 Malignant neoplasm of upper lobe, right bronchus or lung: Secondary | ICD-10-CM | POA: Diagnosis not present

## 2023-09-05 DIAGNOSIS — Z79634 Long term (current) use of topoisomerase inhibitor: Secondary | ICD-10-CM | POA: Diagnosis not present

## 2023-09-05 DIAGNOSIS — Z5111 Encounter for antineoplastic chemotherapy: Secondary | ICD-10-CM | POA: Diagnosis not present

## 2023-09-05 DIAGNOSIS — Z510 Encounter for antineoplastic radiation therapy: Secondary | ICD-10-CM | POA: Diagnosis not present

## 2023-09-05 DIAGNOSIS — C3412 Malignant neoplasm of upper lobe, left bronchus or lung: Secondary | ICD-10-CM | POA: Diagnosis not present

## 2023-09-05 DIAGNOSIS — Z79620 Long term (current) use of immunosuppressive biologic: Secondary | ICD-10-CM | POA: Diagnosis not present

## 2023-09-05 DIAGNOSIS — Z8551 Personal history of malignant neoplasm of bladder: Secondary | ICD-10-CM | POA: Diagnosis not present

## 2023-09-05 DIAGNOSIS — Z87891 Personal history of nicotine dependence: Secondary | ICD-10-CM | POA: Diagnosis not present

## 2023-09-05 DIAGNOSIS — Z79630 Long term (current) use of alkylating agent: Secondary | ICD-10-CM | POA: Diagnosis not present

## 2023-09-05 LAB — RAD ONC ARIA SESSION SUMMARY
Course Elapsed Days: 7
Plan Fractions Treated to Date: 5
Plan Prescribed Dose Per Fraction: 2.5 Gy
Plan Total Fractions Prescribed: 15
Plan Total Prescribed Dose: 37.5 Gy
Reference Point Dosage Given to Date: 12.5 Gy
Reference Point Session Dosage Given: 2.5 Gy
Session Number: 5

## 2023-09-05 MED FILL — Fosaprepitant Dimeglumine For IV Infusion 150 MG (Base Eq): INTRAVENOUS | Qty: 5 | Status: AC

## 2023-09-06 ENCOUNTER — Inpatient Hospital Stay: Payer: 59

## 2023-09-06 ENCOUNTER — Other Ambulatory Visit: Payer: 59

## 2023-09-06 ENCOUNTER — Ambulatory Visit
Admission: RE | Admit: 2023-09-06 | Discharge: 2023-09-06 | Disposition: A | Discharge status: Home / Self Care | Payer: 59 | Source: Ambulatory Visit | Attending: Radiation Oncology

## 2023-09-06 ENCOUNTER — Other Ambulatory Visit: Payer: Self-pay

## 2023-09-06 ENCOUNTER — Ambulatory Visit: Payer: 59 | Admitting: Physician Assistant

## 2023-09-06 VITALS — BP 125/60 | HR 81 | Temp 98.0°F | Resp 18 | Wt 148.1 lb

## 2023-09-06 DIAGNOSIS — Z8551 Personal history of malignant neoplasm of bladder: Secondary | ICD-10-CM | POA: Diagnosis not present

## 2023-09-06 DIAGNOSIS — Z79634 Long term (current) use of topoisomerase inhibitor: Secondary | ICD-10-CM | POA: Diagnosis not present

## 2023-09-06 DIAGNOSIS — Z87891 Personal history of nicotine dependence: Secondary | ICD-10-CM | POA: Diagnosis not present

## 2023-09-06 DIAGNOSIS — Z5112 Encounter for antineoplastic immunotherapy: Secondary | ICD-10-CM | POA: Diagnosis not present

## 2023-09-06 DIAGNOSIS — Z510 Encounter for antineoplastic radiation therapy: Secondary | ICD-10-CM | POA: Diagnosis not present

## 2023-09-06 DIAGNOSIS — C3412 Malignant neoplasm of upper lobe, left bronchus or lung: Secondary | ICD-10-CM

## 2023-09-06 DIAGNOSIS — C3411 Malignant neoplasm of upper lobe, right bronchus or lung: Secondary | ICD-10-CM | POA: Diagnosis not present

## 2023-09-06 DIAGNOSIS — Z79630 Long term (current) use of alkylating agent: Secondary | ICD-10-CM | POA: Diagnosis not present

## 2023-09-06 DIAGNOSIS — Z5111 Encounter for antineoplastic chemotherapy: Secondary | ICD-10-CM | POA: Diagnosis not present

## 2023-09-06 DIAGNOSIS — Z79620 Long term (current) use of immunosuppressive biologic: Secondary | ICD-10-CM | POA: Diagnosis not present

## 2023-09-06 LAB — CMP (CANCER CENTER ONLY)
ALT: 8 U/L (ref 0–44)
AST: 20 U/L (ref 15–41)
Albumin: 3.9 g/dL (ref 3.5–5.0)
Alkaline Phosphatase: 56 U/L (ref 38–126)
Anion gap: 6 (ref 5–15)
BUN: 14 mg/dL (ref 8–23)
CO2: 29 mmol/L (ref 22–32)
Calcium: 9.8 mg/dL (ref 8.9–10.3)
Chloride: 109 mmol/L (ref 98–111)
Creatinine: 1.04 mg/dL (ref 0.61–1.24)
GFR, Estimated: >60 mL/min (ref >60)
Glucose, Bld: 98 mg/dL (ref 70–99)
Potassium: 3.7 mmol/L (ref 3.5–5.1)
Sodium: 144 mmol/L (ref 135–145)
Total Bilirubin: 0.6 mg/dL (ref 0.0–1.2)
Total Protein: 7 g/dL (ref 6.5–8.1)

## 2023-09-06 LAB — CBC WITH DIFFERENTIAL (CANCER CENTER ONLY)
Abs Immature Granulocytes: 0.03 10*3/uL (ref 0.00–0.07)
Basophils Absolute: 0.1 10*3/uL (ref 0.0–0.1)
Basophils Relative: 1 %
Eosinophils Absolute: 0.3 10*3/uL (ref 0.0–0.5)
Eosinophils Relative: 3 %
HCT: 39.5 % (ref 39.0–52.0)
Hemoglobin: 13.2 g/dL (ref 13.0–17.0)
Immature Granulocytes: 0 %
Lymphocytes Relative: 13 %
Lymphs Abs: 1 10*3/uL (ref 0.7–4.0)
MCH: 32.1 pg (ref 26.0–34.0)
MCHC: 33.4 g/dL (ref 30.0–36.0)
MCV: 96.1 fL (ref 80.0–100.0)
Monocytes Absolute: 0.8 10*3/uL (ref 0.1–1.0)
Monocytes Relative: 10 %
Neutro Abs: 5.7 10*3/uL (ref 1.7–7.7)
Neutrophils Relative %: 73 %
Platelet Count: 185 10*3/uL (ref 150–400)
RBC: 4.11 MIL/uL — ABNORMAL LOW (ref 4.22–5.81)
RDW: 12 % (ref 11.5–15.5)
WBC Count: 7.9 10*3/uL (ref 4.0–10.5)
nRBC: 0 % (ref 0.0–0.2)

## 2023-09-06 LAB — RAD ONC ARIA SESSION SUMMARY
Course Elapsed Days: 8
Plan Fractions Treated to Date: 6
Plan Prescribed Dose Per Fraction: 2.5 Gy
Plan Total Fractions Prescribed: 15
Plan Total Prescribed Dose: 37.5 Gy
Reference Point Dosage Given to Date: 15 Gy
Reference Point Session Dosage Given: 2.5 Gy
Session Number: 6

## 2023-09-06 LAB — TSH: TSH: 1.138 u[IU]/mL (ref 0.350–4.500)

## 2023-09-06 MED ORDER — SODIUM CHLORIDE 0.9 % IV SOLN
150.0000 mg | Freq: Once | INTRAVENOUS | Status: AC
  Administered 2023-09-06: 150 mg via INTRAVENOUS
  Filled 2023-09-06: qty 150

## 2023-09-06 MED ORDER — DURVALUMAB 500 MG/10ML IV SOLN
1500.0000 mg | Freq: Once | INTRAVENOUS | Status: AC
  Administered 2023-09-06: 1500 mg via INTRAVENOUS
  Filled 2023-09-06: qty 30

## 2023-09-06 MED ORDER — SODIUM CHLORIDE 0.9 % IV SOLN
387.0000 mg | Freq: Once | INTRAVENOUS | Status: AC
  Administered 2023-09-06: 390 mg via INTRAVENOUS
  Filled 2023-09-06: qty 39

## 2023-09-06 MED ORDER — PALONOSETRON HCL INJECTION 0.25 MG/5ML
0.2500 mg | Freq: Once | INTRAVENOUS | Status: AC
  Administered 2023-09-06: 0.25 mg via INTRAVENOUS
  Filled 2023-09-06: qty 5

## 2023-09-06 MED ORDER — SODIUM CHLORIDE 0.9 % IV SOLN
100.0000 mg/m2 | Freq: Once | INTRAVENOUS | Status: AC
  Administered 2023-09-06: 200 mg via INTRAVENOUS
  Filled 2023-09-06: qty 10

## 2023-09-06 MED ORDER — DEXAMETHASONE SODIUM PHOSPHATE 10 MG/ML IJ SOLN
10.0000 mg | Freq: Once | INTRAMUSCULAR | Status: AC
  Administered 2023-09-06: 10 mg via INTRAVENOUS
  Filled 2023-09-06: qty 1

## 2023-09-06 MED ORDER — SODIUM CHLORIDE 0.9 % IV SOLN: INTRAVENOUS | Status: DC

## 2023-09-06 NOTE — Patient Instructions (Signed)
 [facility]  Discharge Instructions: Thank you for choosing [facility] to provide your oncology and hematology care.   If you have a lab appointment with the Cancer Center, please go directly to the Cancer Center and check in at the registration area.   Wear comfortable clothing and clothing appropriate for easy access to any Portacath or PICC line.   We strive to give you quality time with your provider. You may need to reschedule your appointment if you arrive late (15 or more minutes).  Arriving late affects you and other patients whose appointments are after yours.  Also, if you miss three or more appointments without notifying the office, you may be dismissed from the clinic at the provider's discretion.      For prescription refill requests, have your pharmacy contact our office and allow 72 hours for refills to be completed.    Today you received the following chemotherapy and/or immunotherapy agents: CARBOplatin (PARAPLATIN), durvalumab (IMFINZI) etoposide (VEPESID)    To help prevent nausea and vomiting after your treatment, we encourage you to take your nausea medication as directed.  BELOW ARE SYMPTOMS THAT SHOULD BE REPORTED IMMEDIATELY: *FEVER GREATER THAN 100.4 F (38 C) OR HIGHER *CHILLS OR SWEATING *NAUSEA AND VOMITING THAT IS NOT CONTROLLED WITH YOUR NAUSEA MEDICATION *UNUSUAL SHORTNESS OF BREATH *UNUSUAL BRUISING OR BLEEDING *URINARY PROBLEMS (pain or burning when urinating, or frequent urination) *BOWEL PROBLEMS (unusual diarrhea, constipation, pain near the anus) TENDERNESS IN MOUTH AND THROAT WITH OR WITHOUT PRESENCE OF ULCERS (sore throat, sores in mouth, or a toothache) UNUSUAL RASH, SWELLING OR PAIN  UNUSUAL VAGINAL DISCHARGE OR ITCHING   Items with * indicate a potential emergency and should be followed up as soon as possible or go to the Emergency Department if any problems should  occur.  Please show the CHEMOTHERAPY ALERT CARD or IMMUNOTHERAPY ALERT CARD at check-in to the Emergency Department and triage nurse.  Should you have questions after your visit or need to cancel or reschedule your appointment, please contact [facility]  Dept: [phone]  and follow the prompts.  Office hours are 8:00 a.m. to 4:30 p.m. Monday - Friday. Please note that voicemails left after 4:00 p.m. may not be returned until the following business day.  We are closed weekends and major holidays. You have access to a nurse at all times for urgent questions. Please call the main number to the clinic Dept: [phone] and follow the prompts.   For any non-urgent questions, you may also contact your provider using MyChart. We now offer e-Visits for anyone 87 and older to request care online for non-urgent symptoms. For details visit [email or web address].   Also download the MyChart app! Go to the app store, search "MyChart", open the app, select [facility], and log in with your MyChart username and password.  Carboplatin Injection What is this medication? CARBOPLATIN (KAR boe pla tin) treats some types of cancer. It works by slowing down the growth of cancer cells. This medicine may be used for other purposes; ask your health care provider or pharmacist if you have questions. COMMON BRAND NAME(S): Paraplatin What should I tell my care team before I take this medication? They need to know if you have any of these conditions: Blood disorders Hearing problems Kidney disease Recent or ongoing radiation therapy An unusual or allergic reaction to  carboplatin, cisplatin, other medications, foods, dyes, or preservatives Pregnant or trying to get pregnant Breast-feeding How should I use this medication? This medication is injected into a vein. It is given by your care team in a hospital or clinic setting. Talk to your care team about the  use of this medication in children. Special care may be needed. Overdosage: If you think you have taken too much of this medicine contact a poison control center or emergency room at once. NOTE: This medicine is only for you. Do not share this medicine with others. What if I miss a dose? Keep appointments for follow-up doses. It is important not to miss your dose. Call your care team if you are unable to keep an appointment. What may interact with this medication? Medications for seizures Some antibiotics, such as amikacin, gentamicin, neomycin, streptomycin, tobramycin Vaccines This list may not describe all possible interactions. Give your health care provider a list of all the medicines, herbs, non-prescription drugs, or dietary supplements you use. Also tell them if you smoke, drink alcohol, or use illegal drugs. Some items may interact with your medicine. What should I watch for while using this medication? Your condition will be monitored carefully while you are receiving this medication. You may need blood work while taking this medication. This medication may make you feel generally unwell. This is not uncommon, as chemotherapy can affect healthy cells as well as cancer cells. Report any side effects. Continue your course of treatment even though you feel ill unless your care team tells you to stop. In some cases, you may be given additional medications to help with side effects. Follow all directions for their use. This medication may increase your risk of getting an infection. Call your care team for advice if you get a fever, chills, sore throat, or other symptoms of a cold or flu. Do not treat yourself. Try to avoid being around people who are sick. Avoid taking medications that contain aspirin, acetaminophen, ibuprofen, naproxen, or ketoprofen unless instructed by your care team. These medications may hide a fever. Be careful brushing or flossing your teeth or using a toothpick because  you may get an infection or bleed more easily. If you have any dental work done, tell your dentist you are receiving this medication. Talk to your care team if you wish to become pregnant or think you might be pregnant. This medication can cause serious birth defects. Talk to your care team about effective forms of contraception. Do not breast-feed while taking this medication. What side effects may I notice from receiving this medication? Side effects that you should report to your care team as soon as possible: Allergic reactions--skin rash, itching, hives, swelling of the face, lips, tongue, or throat Infection--fever, chills, cough, sore throat, wounds that don't heal, pain or trouble when passing urine, general feeling of discomfort or being unwell Low red blood cell level--unusual weakness or fatigue, dizziness, headache, trouble breathing Pain, tingling, or numbness in the hands or feet, muscle weakness, change in vision, confusion or trouble speaking, loss of balance or coordination, trouble walking, seizures Unusual bruising or bleeding Side effects that usually do not require medical attention (report to your care team if they continue or are bothersome): Hair loss Nausea Unusual weakness or fatigue Vomiting This list may not describe all possible side effects. Call your doctor for medical advice about side effects. You may report side effects to FDA at 1-800-FDA-1088. Where should I keep my medication? This medication is given in  a hospital or clinic. It will not be stored at home. NOTE: This sheet is a summary. It may not cover all possible information. If you have questions about this medicine, talk to your doctor, pharmacist, or health care provider.  2024 Elsevier/Gold Standard (2021-10-20 00:00:00) Etoposide Injection What is this medication? ETOPOSIDE (e toe POE side) treats some types of cancer. It works by slowing down the growth of cancer cells. This medicine may be used  for other purposes; ask your health care provider or pharmacist if you have questions. COMMON BRAND NAME(S): Etopophos, Toposar, VePesid What should I tell my care team before I take this medication? They need to know if you have any of these conditions: Infection Kidney disease Liver disease Low blood counts, such as low white cell, platelet, red cell counts An unusual or allergic reaction to etoposide, other medications, foods, dyes, or preservatives If you or your partner are pregnant or trying to get pregnant Breastfeeding How should I use this medication? This medication is injected into a vein. It is given by your care team in a hospital or clinic setting. Talk to your care team about the use of this medication in children. Special care may be needed. Overdosage: If you think you have taken too much of this medicine contact a poison control center or emergency room at once. NOTE: This medicine is only for you. Do not share this medicine with others. What if I miss a dose? Keep appointments for follow-up doses. It is important not to miss your dose. Call your care team if you are unable to keep an appointment. What may interact with this medication? Warfarin This list may not describe all possible interactions. Give your health care provider a list of all the medicines, herbs, non-prescription drugs, or dietary supplements you use. Also tell them if you smoke, drink alcohol, or use illegal drugs. Some items may interact with your medicine. What should I watch for while using this medication? Your condition will be monitored carefully while you are receiving this medication. This medication may make you feel generally unwell. This is not uncommon as chemotherapy can affect healthy cells as well as cancer cells. Report any side effects. Continue your course of treatment even though you feel ill unless your care team tells you to stop. This medication can cause serious side effects. To  reduce the risk, your care team may give you other medications to take before receiving this one. Be sure to follow the directions from your care team. This medication may increase your risk of getting an infection. Call your care team for advice if you get a fever, chills, sore throat, or other symptoms of a cold or flu. Do not treat yourself. Try to avoid being around people who are sick. This medication may increase your risk to bruise or bleed. Call your care team if you notice any unusual bleeding. Talk to your care team about your risk of cancer. You may be more at risk for certain types of cancers if you take this medication. Talk to your care team if you may be pregnant. Serious birth defects can occur if you take this medication during pregnancy and for 6 months after the last dose. You will need a negative pregnancy test before starting this medication. Contraception is recommended while taking this medication and for 6 months after the last dose. Your care team can help you find the option that works for you. If your partner can get pregnant, use a condom during  sex while taking this medication and for 4 months after the last dose. Do not breastfeed while taking this medication. This medication may cause infertility. Talk to your care team if you are concerned about your fertility. What side effects may I notice from receiving this medication? Side effects that you should report to your care team as soon as possible: Allergic reactions--skin rash, itching, hives, swelling of the face, lips, tongue, or throat Infection--fever, chills, cough, sore throat, wounds that don't heal, pain or trouble when passing urine, general feeling of discomfort or being unwell Low red blood cell level--unusual weakness or fatigue, dizziness, headache, trouble breathing Unusual bruising or bleeding Side effects that usually do not require medical attention (report to your care team if they continue or are  bothersome): Diarrhea Fatigue Hair loss Loss of appetite Nausea Vomiting This list may not describe all possible side effects. Call your doctor for medical advice about side effects. You may report side effects to FDA at 1-800-FDA-1088. Where should I keep my medication? This medication is given in a hospital or clinic. It will not be stored at home. NOTE: This sheet is a summary. It may not cover all possible information. If you have questions about this medicine, talk to your doctor, pharmacist, or health care provider.  2024 Elsevier/Gold Standard (2021-11-19 00:00:00)Durvalumab Injection What is this medication? DURVALUMAB (dur VAL ue mab) treats some types of cancer. It works by helping your immune system slow or stop the spread of cancer cells. It is a monoclonal antibody. This medicine may be used for other purposes; ask your health care provider or pharmacist if you have questions. COMMON BRAND NAME(S): IMFINZI What should I tell my care team before I take this medication? They need to know if you have any of these conditions: Allogeneic stem cell transplant (uses someone else's stem cells) Autoimmune diseases, such as Crohn disease, ulcerative colitis, lupus History of chest radiation Nervous system problems, such as Guillain-Barre syndrome, myasthenia gravis Organ transplant An unusual or allergic reaction to durvalumab, other medications, foods, dyes, or preservatives Pregnant or trying to get pregnant Breast-feeding How should I use this medication? This medication is infused into a vein. It is given by your care team in a hospital or clinic setting. A special MedGuide will be given to you before each treatment. Be sure to read this information carefully each time. Talk to your care team about the use of this medication in children. Special care may be needed. Overdosage: If you think you have taken too much of this medicine contact a poison control center or emergency room  at once. NOTE: This medicine is only for you. Do not share this medicine with others. What if I miss a dose? Keep appointments for follow-up doses. It is important not to miss your dose. Call your care team if you are unable to keep an appointment. What may interact with this medication? Interactions have not been studied. This list may not describe all possible interactions. Give your health care provider a list of all the medicines, herbs, non-prescription drugs, or dietary supplements you use. Also tell them if you smoke, drink alcohol, or use illegal drugs. Some items may interact with your medicine. What should I watch for while using this medication? Your condition will be monitored carefully while you are receiving this medication. You may need blood work while taking this medication. This medication may cause serious skin reactions. They can happen weeks to months after starting the medication. Contact your care team  right away if you notice fevers or flu-like symptoms with a rash. The rash may be red or purple and then turn into blisters or peeling of the skin. You may also notice a red rash with swelling of the face, lips, or lymph nodes in your neck or under your arms. Tell your care team right away if you have any change in your eyesight. Talk to your care team if you may be pregnant. Serious birth defects can occur if you take this medication during pregnancy and for 3 months after the last dose. You will need a negative pregnancy test before starting this medication. Contraception is recommended while taking this medication and for 3 months after the last dose. Your care team can help you find the option that works for you. Do not breastfeed while taking this medication and for 3 months after the last dose. What side effects may I notice from receiving this medication? Side effects that you should report to your care team as soon as possible: Allergic reactions--skin rash, itching,  hives, swelling of the face, lips, tongue, or throat Dry cough, shortness of breath or trouble breathing Eye pain, redness, irritation, or discharge with blurry or decreased vision Heart muscle inflammation--unusual weakness or fatigue, shortness of breath, chest pain, fast or irregular heartbeat, dizziness, swelling of the ankles, feet, or hands Hormone gland problems--headache, sensitivity to light, unusual weakness or fatigue, dizziness, fast or irregular heartbeat, increased sensitivity to cold or heat, excessive sweating, constipation, hair loss, increased thirst or amount of urine, tremors or shaking, irritability Infusion reactions--chest pain, shortness of breath or trouble breathing, feeling faint or lightheaded Kidney injury (glomerulonephritis)--decrease in the amount of urine, red or dark brown urine, foamy or bubbly urine, swelling of the ankles, hands, or feet Liver injury--right upper belly pain, loss of appetite, nausea, light-colored stool, dark yellow or brown urine, yellowing skin or eyes, unusual weakness or fatigue Pain, tingling, or numbness in the hands or feet, muscle weakness, change in vision, confusion or trouble speaking, loss of balance or coordination, trouble walking, seizures Rash, fever, and swollen lymph nodes Redness, blistering, peeling, or loosening of the skin, including inside the mouth Sudden or severe stomach pain, bloody diarrhea, fever, nausea, vomiting Side effects that usually do not require medical attention (report these to your care team if they continue or are bothersome): Bone, joint, or muscle pain Diarrhea Fatigue Loss of appetite Nausea Skin rash This list may not describe all possible side effects. Call your doctor for medical advice about side effects. You may report side effects to FDA at 1-800-FDA-1088. Where should I keep my medication? This medication is given in a hospital or clinic. It will not be stored at home. NOTE: This sheet is a  summary. It may not cover all possible information. If you have questions about this medicine, talk to your doctor, pharmacist, or health care provider.  2024 Elsevier/Gold Standard (2021-11-10 00:00:00)

## 2023-09-07 ENCOUNTER — Inpatient Hospital Stay: Payer: 59

## 2023-09-07 ENCOUNTER — Ambulatory Visit
Admission: RE | Admit: 2023-09-07 | Discharge: 2023-09-07 | Disposition: A | Discharge status: Home / Self Care | Payer: 59 | Source: Ambulatory Visit | Attending: Radiation Oncology | Admitting: Radiation Oncology

## 2023-09-07 ENCOUNTER — Other Ambulatory Visit: Payer: Self-pay

## 2023-09-07 VITALS — BP 103/49 | HR 69 | Temp 97.9°F | Resp 18

## 2023-09-07 DIAGNOSIS — Z79630 Long term (current) use of alkylating agent: Secondary | ICD-10-CM | POA: Diagnosis not present

## 2023-09-07 DIAGNOSIS — Z87891 Personal history of nicotine dependence: Secondary | ICD-10-CM | POA: Diagnosis not present

## 2023-09-07 DIAGNOSIS — C3411 Malignant neoplasm of upper lobe, right bronchus or lung: Secondary | ICD-10-CM | POA: Diagnosis not present

## 2023-09-07 DIAGNOSIS — Z510 Encounter for antineoplastic radiation therapy: Secondary | ICD-10-CM | POA: Diagnosis not present

## 2023-09-07 DIAGNOSIS — Z5112 Encounter for antineoplastic immunotherapy: Secondary | ICD-10-CM | POA: Diagnosis not present

## 2023-09-07 DIAGNOSIS — Z5111 Encounter for antineoplastic chemotherapy: Secondary | ICD-10-CM | POA: Diagnosis not present

## 2023-09-07 DIAGNOSIS — Z79634 Long term (current) use of topoisomerase inhibitor: Secondary | ICD-10-CM | POA: Diagnosis not present

## 2023-09-07 DIAGNOSIS — Z79620 Long term (current) use of immunosuppressive biologic: Secondary | ICD-10-CM | POA: Diagnosis not present

## 2023-09-07 DIAGNOSIS — C3412 Malignant neoplasm of upper lobe, left bronchus or lung: Secondary | ICD-10-CM

## 2023-09-07 DIAGNOSIS — Z8551 Personal history of malignant neoplasm of bladder: Secondary | ICD-10-CM | POA: Diagnosis not present

## 2023-09-07 LAB — RAD ONC ARIA SESSION SUMMARY
Course Elapsed Days: 9
Plan Fractions Treated to Date: 7
Plan Prescribed Dose Per Fraction: 2.5 Gy
Plan Total Fractions Prescribed: 15
Plan Total Prescribed Dose: 37.5 Gy
Reference Point Dosage Given to Date: 17.5 Gy
Reference Point Session Dosage Given: 2.5 Gy
Session Number: 7

## 2023-09-07 LAB — T4: T4, Total: 8.8 ug/dL (ref 4.5–12.0)

## 2023-09-07 MED ORDER — DEXAMETHASONE SODIUM PHOSPHATE 10 MG/ML IJ SOLN
10.0000 mg | Freq: Once | INTRAMUSCULAR | Status: AC
  Administered 2023-09-07: 10 mg via INTRAVENOUS
  Filled 2023-09-07: qty 1

## 2023-09-07 MED ORDER — SODIUM CHLORIDE 0.9 % IV SOLN: INTRAVENOUS | Status: DC

## 2023-09-07 MED ORDER — SODIUM CHLORIDE 0.9 % IV SOLN
100.0000 mg/m2 | Freq: Once | INTRAVENOUS | Status: AC
  Administered 2023-09-07: 200 mg via INTRAVENOUS
  Filled 2023-09-07: qty 10

## 2023-09-08 ENCOUNTER — Inpatient Hospital Stay: Payer: 59

## 2023-09-08 ENCOUNTER — Ambulatory Visit
Admission: RE | Admit: 2023-09-08 | Discharge: 2023-09-08 | Disposition: A | Discharge status: Home / Self Care | Payer: 59 | Source: Ambulatory Visit | Attending: Radiation Oncology

## 2023-09-08 ENCOUNTER — Other Ambulatory Visit: Payer: Self-pay

## 2023-09-08 VITALS — BP 105/53 | HR 64 | Temp 98.3°F | Resp 16

## 2023-09-08 DIAGNOSIS — Z79630 Long term (current) use of alkylating agent: Secondary | ICD-10-CM | POA: Diagnosis not present

## 2023-09-08 DIAGNOSIS — Z8551 Personal history of malignant neoplasm of bladder: Secondary | ICD-10-CM | POA: Diagnosis not present

## 2023-09-08 DIAGNOSIS — C3412 Malignant neoplasm of upper lobe, left bronchus or lung: Secondary | ICD-10-CM

## 2023-09-08 DIAGNOSIS — Z510 Encounter for antineoplastic radiation therapy: Secondary | ICD-10-CM | POA: Diagnosis not present

## 2023-09-08 DIAGNOSIS — Z79620 Long term (current) use of immunosuppressive biologic: Secondary | ICD-10-CM | POA: Diagnosis not present

## 2023-09-08 DIAGNOSIS — C3411 Malignant neoplasm of upper lobe, right bronchus or lung: Secondary | ICD-10-CM | POA: Diagnosis not present

## 2023-09-08 DIAGNOSIS — Z87891 Personal history of nicotine dependence: Secondary | ICD-10-CM | POA: Diagnosis not present

## 2023-09-08 DIAGNOSIS — Z5112 Encounter for antineoplastic immunotherapy: Secondary | ICD-10-CM | POA: Diagnosis not present

## 2023-09-08 DIAGNOSIS — Z5111 Encounter for antineoplastic chemotherapy: Secondary | ICD-10-CM | POA: Diagnosis not present

## 2023-09-08 DIAGNOSIS — Z79634 Long term (current) use of topoisomerase inhibitor: Secondary | ICD-10-CM | POA: Diagnosis not present

## 2023-09-08 LAB — RAD ONC ARIA SESSION SUMMARY
Course Elapsed Days: 10
Plan Fractions Treated to Date: 8
Plan Prescribed Dose Per Fraction: 2.5 Gy
Plan Total Fractions Prescribed: 15
Plan Total Prescribed Dose: 37.5 Gy
Reference Point Dosage Given to Date: 20 Gy
Reference Point Session Dosage Given: 2.5 Gy
Session Number: 8

## 2023-09-08 MED ORDER — SODIUM CHLORIDE 0.9 % IV SOLN: INTRAVENOUS | Status: DC

## 2023-09-08 MED ORDER — DEXAMETHASONE SODIUM PHOSPHATE 10 MG/ML IJ SOLN
10.0000 mg | Freq: Once | INTRAMUSCULAR | Status: AC
  Administered 2023-09-08: 10 mg via INTRAVENOUS
  Filled 2023-09-08: qty 1

## 2023-09-08 MED ORDER — SODIUM CHLORIDE 0.9 % IV SOLN
100.0000 mg/m2 | Freq: Once | INTRAVENOUS | Status: AC
  Administered 2023-09-08: 200 mg via INTRAVENOUS
  Filled 2023-09-08: qty 10

## 2023-09-08 NOTE — Patient Instructions (Signed)
 [facility]  Discharge Instructions: Thank you for choosing [facility] to provide your oncology and hematology care.   If you have a lab appointment with the Cancer Center, please go directly to the Cancer Center and check in at the registration area.   Wear comfortable clothing and clothing appropriate for easy access to any Portacath or PICC line.   We strive to give you quality time with your provider. You may need to reschedule your appointment if you arrive late (15 or more minutes).  Arriving late affects you and other patients whose appointments are after yours.  Also, if you miss three or more appointments without notifying the office, you may be dismissed from the clinic at the provider's discretion.      For prescription refill requests, have your pharmacy contact our office and allow 72 hours for refills to be completed.    Today you received the following chemotherapy and/or immunotherapy agents: Vepesid      To help prevent nausea and vomiting after your treatment, we encourage you to take your nausea medication as directed.  BELOW ARE SYMPTOMS THAT SHOULD BE REPORTED IMMEDIATELY: *FEVER GREATER THAN 100.4 F (38 C) OR HIGHER *CHILLS OR SWEATING *NAUSEA AND VOMITING THAT IS NOT CONTROLLED WITH YOUR NAUSEA MEDICATION *UNUSUAL SHORTNESS OF BREATH *UNUSUAL BRUISING OR BLEEDING *URINARY PROBLEMS (pain or burning when urinating, or frequent urination) *BOWEL PROBLEMS (unusual diarrhea, constipation, pain near the anus) TENDERNESS IN MOUTH AND THROAT WITH OR WITHOUT PRESENCE OF ULCERS (sore throat, sores in mouth, or a toothache) UNUSUAL RASH, SWELLING OR PAIN  UNUSUAL VAGINAL DISCHARGE OR ITCHING   Items with * indicate a potential emergency and should be followed up as soon as possible or go to the Emergency Department if any problems should occur.  Please show the CHEMOTHERAPY ALERT CARD or IMMUNOTHERAPY  ALERT CARD at check-in to the Emergency Department and triage nurse.  Should you have questions after your visit or need to cancel or reschedule your appointment, please contact [facility]  Dept: [phone]  and follow the prompts.  Office hours are 8:00 a.m. to 4:30 p.m. Monday - Friday. Please note that voicemails left after 4:00 p.m. may not be returned until the following business day.  We are closed weekends and major holidays. You have access to a nurse at all times for urgent questions. Please call the main number to the clinic Dept: [phone] and follow the prompts.   For any non-urgent questions, you may also contact your provider using MyChart. We now offer e-Visits for anyone 46 and older to request care online for non-urgent symptoms. For details visit [email or web address].   Also download the MyChart app! Go to the app store, search "MyChart", open the app, select [place], and log in with your MyChart username and password.

## 2023-09-09 ENCOUNTER — Ambulatory Visit
Admission: RE | Admit: 2023-09-09 | Discharge: 2023-09-09 | Disposition: A | Discharge status: Home / Self Care | Payer: 59 | Source: Ambulatory Visit | Attending: Radiation Oncology | Admitting: Radiation Oncology

## 2023-09-09 ENCOUNTER — Other Ambulatory Visit: Payer: Self-pay

## 2023-09-09 ENCOUNTER — Telehealth: Payer: Self-pay | Admitting: *Deleted

## 2023-09-09 DIAGNOSIS — Z79620 Long term (current) use of immunosuppressive biologic: Secondary | ICD-10-CM | POA: Diagnosis not present

## 2023-09-09 DIAGNOSIS — Z510 Encounter for antineoplastic radiation therapy: Secondary | ICD-10-CM | POA: Diagnosis not present

## 2023-09-09 DIAGNOSIS — Z79634 Long term (current) use of topoisomerase inhibitor: Secondary | ICD-10-CM | POA: Diagnosis not present

## 2023-09-09 DIAGNOSIS — C3411 Malignant neoplasm of upper lobe, right bronchus or lung: Secondary | ICD-10-CM | POA: Diagnosis not present

## 2023-09-09 DIAGNOSIS — C3412 Malignant neoplasm of upper lobe, left bronchus or lung: Secondary | ICD-10-CM | POA: Diagnosis not present

## 2023-09-09 DIAGNOSIS — Z5111 Encounter for antineoplastic chemotherapy: Secondary | ICD-10-CM | POA: Diagnosis not present

## 2023-09-09 DIAGNOSIS — Z79630 Long term (current) use of alkylating agent: Secondary | ICD-10-CM | POA: Diagnosis not present

## 2023-09-09 DIAGNOSIS — Z5112 Encounter for antineoplastic immunotherapy: Secondary | ICD-10-CM | POA: Diagnosis not present

## 2023-09-09 DIAGNOSIS — Z8551 Personal history of malignant neoplasm of bladder: Secondary | ICD-10-CM | POA: Diagnosis not present

## 2023-09-09 DIAGNOSIS — Z87891 Personal history of nicotine dependence: Secondary | ICD-10-CM | POA: Diagnosis not present

## 2023-09-09 LAB — RAD ONC ARIA SESSION SUMMARY
Course Elapsed Days: 11
Plan Fractions Treated to Date: 9
Plan Prescribed Dose Per Fraction: 2.5 Gy
Plan Total Fractions Prescribed: 15
Plan Total Prescribed Dose: 37.5 Gy
Reference Point Dosage Given to Date: 22.5 Gy
Reference Point Session Dosage Given: 2.5 Gy
Session Number: 9

## 2023-09-09 NOTE — Telephone Encounter (Signed)
-----   Message from Nurse Ashby Dawes sent at 09/06/2023  1:58 PM EST ----- Regarding: First time/ Durvalumab, Carboplatin, and Etoposide/ Dr Arbutus Ped pt Pt had first time durvaluab, carboplatin, and etoposide today. Tolerated well.\  Thanks!

## 2023-09-09 NOTE — Telephone Encounter (Signed)
 Called to check on pt to see how he did with his recent treatment.  He reports doing well & denies problems.  He is on his way for his radiation appt.  Discussed appt for tomorrow for injection & reminded about claritin/tylenol.  He knows his appts & how to reach [place] if needed.

## 2023-09-10 ENCOUNTER — Other Ambulatory Visit: Payer: Self-pay

## 2023-09-10 ENCOUNTER — Inpatient Hospital Stay: Payer: 59 | Attending: Internal Medicine

## 2023-09-10 VITALS — BP 131/51 | HR 73 | Temp 97.5°F | Resp 17

## 2023-09-10 DIAGNOSIS — Z8551 Personal history of malignant neoplasm of bladder: Secondary | ICD-10-CM | POA: Diagnosis not present

## 2023-09-10 DIAGNOSIS — C3412 Malignant neoplasm of upper lobe, left bronchus or lung: Secondary | ICD-10-CM

## 2023-09-10 DIAGNOSIS — C3411 Malignant neoplasm of upper lobe, right bronchus or lung: Secondary | ICD-10-CM | POA: Insufficient documentation

## 2023-09-10 DIAGNOSIS — Z5111 Encounter for antineoplastic chemotherapy: Secondary | ICD-10-CM | POA: Insufficient documentation

## 2023-09-10 DIAGNOSIS — Z5112 Encounter for antineoplastic immunotherapy: Secondary | ICD-10-CM | POA: Diagnosis not present

## 2023-09-10 DIAGNOSIS — Z8546 Personal history of malignant neoplasm of prostate: Secondary | ICD-10-CM | POA: Diagnosis not present

## 2023-09-10 DIAGNOSIS — Z5189 Encounter for other specified aftercare: Secondary | ICD-10-CM | POA: Diagnosis not present

## 2023-09-10 DIAGNOSIS — R229 Localized swelling, mass and lump, unspecified: Secondary | ICD-10-CM | POA: Diagnosis not present

## 2023-09-10 MED ORDER — PEGFILGRASTIM-FPGK 6 MG/0.6ML ~~LOC~~ SOSY
6.0000 mg | PREFILLED_SYRINGE | Freq: Once | SUBCUTANEOUS | Status: AC
  Administered 2023-09-10: 6 mg via SUBCUTANEOUS

## 2023-09-10 NOTE — Progress Notes (Unsigned)
 [facility] OFFICE PROGRESS NOTE  Practice, [facility] [address] 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  Presented with a large left hilar/mediastinal mass, multiple bilateral pulmonary nodules, prevascular lymph node, and subcutaneous nodules consistent with metastatic disease.  He was diagnosed in January 2025. 3) history of prostate cancer in 2014 4) history of urothelial carcinoma in 2017  PRIOR THERAPY: None  CURRENT THERAPY: 1) palliative course of radiation to the left lung. The last days expected on 09/16/2023. Under the care of Dr. Mitzi Hansen 2) Palliative systemic chemotherapy with carboplatin for an AUC of 5 on day 1 and etoposide 100 mg/m2 on days 1, 2 and 3, and Imfinzi 1500 mg IV every 3 weeks with neulsta support. First dose on 09/06/23.  3) SBRT to the synchronous stage I NSCLC  INTERVAL HISTORY: GABRIELL CASIMIR 78 y.o. male returns to the clinic today for a follow-up visit.  The patient establish care in the clinic on 08/25/2023.  The patient was found to have extensive stage small cell lung cancer in addition to synchronous early-stage non-small cell lung cancer.  He is currently undergoing palliative radiation to the left lung under the care of Dr. Mitzi Hansen and SBRT to the synchronous stage I non-small cell lung cancer.  He underwent his first cycle of systemic chemotherapy and immunotherapy last week and tolerated it well without any concerning adverse side effects. He had his Neulasta injection on 09/10/2023.  He is scheduled for Port-A-Cath on 09/19/2023.    Overall, he states he is feeling "same as I feel for last 25 years".  He denies any fever or chills.  He reports he intermittently struggles with night sweats on and off and has been doing so for the last 5 to 6 years.  Over the last 6 months, he has lost about 40 pounds  unintentionally.  He gained 2 lbs since he was last seen. His appetite has been "pretty good".  He denies shortness of breath. He denies any significant cough send for cough related to allergies but overall is not having allergies at this time.  He denies any hemoptysis or chest pain.  He denies any nausea, vomiting, diarrhea, or constipation.  He denies any headache or visual changes.  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  MEDICATIONS:  Current Outpatient Medications  Medication Sig Dispense Refill  albuterol (VENTOLIN HFA) 108 (90 Base) MCG/ACT inhaler Inhale 1-2 puffs into the lungs every 6 (six) hours as needed for wheezing or shortness of breath. 8 g 2   amLODipine (NORVASC) 10 MG tablet Take 10 mg by mouth daily.     COMIRNATY syringe      FLUZONE HIGH-DOSE 0.5 ML injection      lidocaine-prilocaine (EMLA) cream Apply 1 Application topically as needed. 30 g 2   prochlorperazine (COMPAZINE) 10 MG tablet Take 1 tablet (10 mg total) by mouth every 6 (six) hours as needed. 30 tablet 2   No  current facility-administered medications for this visit.    SURGICAL HISTORY:  Past Surgical History:  Procedure Laterality Date   ABDOMINAL HERNIA REPAIR  1990's   BRONCHIAL BIOPSY  08/08/2023   Procedure: BRONCHIAL BIOPSIES;  Surgeon: Leslye Peer, MD;  Location: [facility] ENDOSCOPY;  Service: Pulmonary;;   BRONCHIAL BRUSHINGS  08/08/2023   Procedure: BRONCHIAL BRUSHINGS;  Surgeon: Leslye Peer, MD;  Location: [facility] ENDOSCOPY;  Service: Pulmonary;;   BRONCHIAL NEEDLE ASPIRATION BIOPSY  08/08/2023   Procedure: BRONCHIAL NEEDLE ASPIRATION BIOPSIES;  Surgeon: Leslye Peer, MD;  Location: [facility] ENDOSCOPY;  Service: Pulmonary;;   CYSTOSCOPY W/ RETROGRADES N/A 11/03/2015   Procedure: CYSTOSCOPY WITH RETROGRADE PYELOGRAM;  Surgeon: Malen Gauze, MD;  Location: [facility];  Service: Urology;  Laterality: N/A;   CYSTOSCOPY W/ RETROGRADES Left 01/09/2016   Procedure: CYSTOSCOPY WITH LEFT RETROGRADE PYELOGRAM;  Surgeon: Malen Gauze, MD;  Location: [facility];  Service: Urology;  Laterality: Left;   CYSTOSCOPY W/ RETROGRADES Bilateral 05/28/2016   Procedure: CYSTOSCOPY WITH RETROGRADE PYELOGRAM;  Surgeon: Malen Gauze, MD;  Location: [facility];  Service: Urology;  Laterality: Bilateral;   CYSTOSCOPY W/ RETROGRADES Bilateral 02/17/2021   Procedure: CYSTOSCOPY WITH RETROGRADE PYELOGRAM;  Surgeon: Belva Agee, MD;  Location: [facility];  Service: Urology;  Laterality: Bilateral;   CYSTOSCOPY W/ URETERAL STENT REMOVAL Left 01/09/2016   Procedure: CYSTOSCOPY WITH LEFT STENT EXCHANGE;  Surgeon: Malen Gauze, MD;  Location: [facility];  Service: Urology;  Laterality: Left;   CYSTOSCOPY WITH BIOPSY N/A 05/28/2016   Procedure: CYSTOSCOPY WITH BIOPSY;  Surgeon: Malen Gauze, MD;  Location: [facility];  Service: Urology;  Laterality: N/A;   CYSTOSCOPY WITH URETHRAL DILATATION N/A 05/28/2016    Procedure: CYSTOSCOPY WITH URETHRAL DILATATION;  Surgeon: Malen Gauze, MD;  Location: [facility];  Service: Urology;  Laterality: N/A;   HEMOSTASIS CONTROL  08/08/2023   Procedure: HEMOSTASIS CONTROL;  Surgeon: Leslye Peer, MD;  Location: [facility] ENDOSCOPY;  Service: Pulmonary;;   PROSTATE BIOPSY  05/08/12   Adenocarcinoma   RADIOACTIVE SEED IMPLANT  08/01/2012   Procedure: RADIOACTIVE SEED IMPLANT;  Surgeon: Lindaann Slough, MD;  Location:  [facility];  Service: Urology;  Laterality: N/A;  73 seeds implanted no seeds found in bladder   TRANSURETHRAL RESECTION OF BLADDER TUMOR N/A 11/03/2015   Procedure: TRANSURETHRAL RESECTION OF BLADDER TUMOR (TURBT)WITH INSERTION STENT;  Surgeon: Malen Gauze, MD;  Location: [facility];  Service: Urology;  Laterality: N/A;   TRANSURETHRAL RESECTION OF BLADDER TUMOR N/A 02/17/2021   Procedure: TRANSURETHRAL RESECTION OF BLADDER TUMOR (TURBT);  Surgeon: Belva Agee, MD;  Location: [facility];  Service: Urology;  Laterality: N/A;  30 MINS   TRANSURETHRAL RESECTION OF BLADDER TUMOR WITH GYRUS (TURBT-GYRUS) N/A 01/09/2016   Procedure: TRANSURETHRAL RESECTION  OF BLADDER TUMOR WITH GYRUS (TURBT-GYRUS);  Surgeon: Malen Gauze, MD;  Location: [facility];  Service: Urology;  Laterality: N/A;   VIDEO BRONCHOSCOPY WITH RADIAL ENDOBRONCHIAL ULTRASOUND  08/08/2023   Procedure: VIDEO BRONCHOSCOPY WITH RADIAL ENDOBRONCHIAL ULTRASOUND;  Surgeon: Leslye Peer, MD;  Location: [facility] ENDOSCOPY;  Service: Pulmonary;;    REVIEW OF SYSTEMS:   Review of Systems  Constitutional: Negative for appetite change, chills, fatigue, fever and unexpected weight change.  HENT:   Negative for mouth sores, nosebleeds, sore throat and trouble swallowing.   Eyes: Negative for eye problems and icterus.  Respiratory: Negative for cough, hemoptysis, shortness of breath and wheezing.   Cardiovascular:  Negative for chest pain and leg swelling.  Gastrointestinal: Negative for abdominal pain, constipation, diarrhea, nausea and vomiting.  Genitourinary: Negative for bladder incontinence, difficulty urinating, dysuria, frequency and hematuria.   Musculoskeletal: Negative for back pain, gait problem, neck pain and neck stiffness.  Skin: Negative for itching and rash.  Neurological: Negative for dizziness, extremity weakness, gait problem, headaches, light-headedness and seizures.  Hematological: Negative for adenopathy. Does not bruise/bleed easily.  Psychiatric/Behavioral: Negative for confusion, depression and sleep disturbance. The patient is not nervous/anxious.     PHYSICAL EXAMINATION:  Blood pressure (!) 134/52, pulse 85, temperature 97.6 F (36.4 C), temperature source Temporal, resp. rate 16, weight 152 lb 11.2 oz (69.3 kg), SpO2 98%.  ECOG PERFORMANCE STATUS: 1  Physical Exam  Constitutional: Oriented to person, place, and time and thin appearing male and in no distress.  HENT:  Head: Normocephalic and atraumatic.  Mouth/Throat: Oropharynx is clear and moist. No oropharyngeal exudate.  Eyes: Conjunctivae are normal. Right eye exhibits no discharge. Left eye exhibits no discharge. No scleral icterus.  Neck: Normal range of motion. Neck supple.  Cardiovascular: Normal rate, regular rhythm, normal heart sounds and intact distal pulses.   Pulmonary/Chest: Effort normal.  Quiet breath sounds in the left lung.  No respiratory distress. No wheezes. No rales.   Abdominal: Soft. Bowel sounds are normal. Exhibits no distension and no mass. There is no tenderness.  Musculoskeletal: Normal range of motion. Exhibits no edema.  Lymphadenopathy:    No cervical adenopathy.  Neurological: Alert and oriented to person, place, and time. Exhibits normal muscle tone. Gait normal. Coordination normal.  Skin: Positive for scattered subcutaneous nodules.  Skin is warm and dry. No rash noted. Not  diaphoretic. No erythema. No pallor.  Psychiatric: Mood, memory and judgment normal.  Vitals reviewed.  LABORATORY DATA: Lab Results  Component Value Date   WBC 18.6 (H) 09/12/2023   HGB 11.8 (L) 09/12/2023   HCT 36.2 (L) 09/12/2023   MCV 98.1 09/12/2023   PLT 97 (L) 09/12/2023      Chemistry      Component Value Date/Time   NA 141 09/12/2023 1510   K 4.2 09/12/2023 1510   CL 104 09/12/2023 1510   CO2 34 (H) 09/12/2023 1510   BUN 15 09/12/2023 1510   CREATININE 0.83 09/12/2023 1510      Component Value Date/Time   CALCIUM 9.6 09/12/2023 1510   ALKPHOS 67 09/12/2023 1510   AST 17 09/12/2023 1510   ALT 9 09/12/2023 1510   BILITOT 0.5 09/12/2023 1510       RADIOGRAPHIC STUDIES:  NM PET Image Initial (PI) Skull Base To Thigh Result Date: 08/24/2023 CLINICAL DATA:  Initial treatment strategy for non-small cell lung cancer. EXAM: NUCLEAR MEDICINE PET SKULL BASE TO THIGH TECHNIQUE: 7.52 mCi F-18 FDG was injected intravenously.  Full-ring PET imaging was performed from the skull base to thigh after the radiotracer. CT data was obtained and used for attenuation correction and anatomic localization. Fasting blood glucose: 113 mg/dl COMPARISON:  CT chest 13/02/6577 FINDINGS: Mediastinal blood pool activity: SUV max 1.51 Liver activity: SUV max NA NECK: No hypermetabolic lymph nodes in the neck. Incidental CT findings: None. CHEST: There is a large left hilar/mediastinal mass which is centered around the AP window region and extending into the left paratracheal region and left pre vascular regions. This is diffusely FDG avid. The mass measures 9.4 x 6.7 cm and has an SUV max of 10.46, image 77/4. Within the superior mediastinum there is a pre-vascular node measuring 1 cm with SUV max of 4.84. Left lower lobe subpleural nodule abutting the scratch set subpleural nodule adjacent to the posterior arch of the thoracic aorta measures 2 cm with SUV max of 6.91, image 79/4. Within the anterior left  upper lobe there is a subpleural nodule measuring 1.1 cm with SUV max of 4.92. Ill-defined nodule in the posterior right upper lobe measures 2 cm with SUV max of 6.27. Adjacent nodule measures 1.5 cm with SUV max of 2.34. Incidental CT findings: Aortic atherosclerosis. Coronary artery calcifications. Heart size is enlarged. No pericardial or pleural effusion. ABDOMEN/PELVIS: No abnormal hypermetabolic activity within the liver, pancreas, adrenal glands, or spleen. No hypermetabolic lymph nodes in the abdomen or pelvis. Incidental CT findings: Aortic atherosclerosis. Seed implants noted within the prostate gland. SKELETON: No tracer avid osseous metastatic disease. MULTIPLE SUBCUTANEOUS NODULES APPEAR TRACER AVID.  FOR EXAMPLE: MULTIPLE SUBCUTANEOUS NODULES APPEAR TRACER AVID.  FOR EXAMPLE -Within the paramidline right ventral chest wall there is a 1.5 cm nodule with SUV max of 4.25, image 82/4. -Within the right lateral chest wall there is a subcutaneous nodule measuring 1.3 cm with SUV max of 3.99. -Left lateral chest wall subcutaneous nodule measures 0.7 cm with SUV max of 1.67, image 75/4. Incidental CT findings: None. IMPRESSION: 1. There is a large left hilar/mediastinal mass which is diffusely FDG avid and compatible with primary bronchogenic carcinoma. 2. There are multiple tracer avid bilateral pulmonary nodules compatible with metastatic disease. 3. There is a tracer avid pre-vascular lymph node compatible with metastatic disease. 4. Multiple tracer avid subcutaneous nodules compatible with metastatic disease. 5.  Aortic Atherosclerosis (ICD10-I70.0). Electronically Signed   By: Signa Kell M.D.   On: 08/24/2023 17:41   MR BRAIN W WO CONTRAST Result Date: 08/22/2023 CLINICAL DATA:  Provided history: Malignant neoplasm of unspecified part of unspecified bronchus or lung. Non-small cell lung cancer, staging. EXAM: MRI HEAD WITHOUT AND WITH CONTRAST TECHNIQUE: Multiplanar, multiecho pulse sequences of  the brain and surrounding structures were obtained without and with intravenous contrast. CONTRAST:  6mL GADAVIST GADOBUTROL 1 MMOL/ML IV SOLN COMPARISON:  None. FINDINGS: Brain: Mild-to-moderate generalized parenchymal atrophy. Multifocal T2 FLAIR hyperintense signal abnormality within the cerebral white matter and pons, nonspecific but compatible with minimal chronic small vessel ischemic disease. No cortical encephalomalacia identified. There is no acute infarct. No evidence of an intracranial mass. No chronic intracranial blood products. No extra-axial fluid collection. No midline shift. No pathologic intracranial enhancement is identified. Vascular: Maintained flow voids within the proximal large arterial vessels. Skull and upper cervical spine: No focal worrisome marrow lesion. Incompletely assessed cervical spondylosis. Sinuses/orbits: No orbital mass or acute orbital finding.Minimal mucosal thickening within the bilateral ethmoid and left sphenoid sinuses. Small mucous retention cyst within the right maxillary sinus. IMPRESSION: 1. No evidence of intracranial  metastatic disease. 2. Minimal chronic small vessel ischemic changes within the cerebral white matter and pons. 3. Mild-to-moderate generalized parenchymal atrophy. 4. Paranasal sinus disease as described. Electronically Signed   By: Jackey Loge D.O.   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  Presented with a large left hilar/mediastinal mass, multiple bilateral pulmonary nodules, prevascular lymph node, and subcutaneous nodules consistent with metastatic disease.  He was diagnosed in January 2025. 3) history of prostate cancer in 2014 4) history of urothelial carcinoma in 2017   The patient is going to start a 3-week palliative course of  radiation to the left lung.  The last days expected on 09/16/2023.  This will be given under the care of Dr. Mitzi Hansen.    Given the small cell lung cancer in the left lung, Dr. Arbutus Ped recommended chemotherapy with carboplatin for an AUC of 5 on day 1 and etoposide 100 mg/m2 on days 1, 2 and 3, and Imfinzi 1500 mg IV every 3 weeks with neulsta support. He started this on 09/06/23. He is status post 1 cycle and tolerated it well without any appreciable adverse side effects.  Labs were reviewed.   We will see him back in 2 weeks for evaluation and repeat blood work before undergoing cycle #2     I will request a lab visit prior to his port-a-cath insertion next week for weekly labs.    He will undergo SBRT to the synchronous stage I NSCLC .    He is expected to have a port-a-cath placed on 09/19/23. We reviewed how to use the EMLA cream once his port-a-cath site heals.   The patient was advised to call immediately if he has any concerning symptoms in the interval. The patient voices understanding of current disease status and treatment options and is in agreement with the current care plan. All questions were answered. The patient knows to call the clinic with any problems, questions or concerns. We can certainly see the patient much sooner if necessary       No orders of the defined types were placed in this encounter.   The total time spent in the appointment was 20-29 minutes  Chelsye Suhre L Cambria Osten, PA-C 09/12/23

## 2023-09-11 ENCOUNTER — Other Ambulatory Visit: Payer: Self-pay

## 2023-09-12 ENCOUNTER — Other Ambulatory Visit: Payer: Self-pay | Admitting: Physician Assistant

## 2023-09-12 ENCOUNTER — Inpatient Hospital Stay (HOSPITAL_BASED_OUTPATIENT_CLINIC_OR_DEPARTMENT_OTHER): Payer: 59 | Admitting: Physician Assistant

## 2023-09-12 ENCOUNTER — Inpatient Hospital Stay: Payer: 59

## 2023-09-12 ENCOUNTER — Ambulatory Visit
Admission: RE | Admit: 2023-09-12 | Discharge: 2023-09-12 | Disposition: A | Discharge status: Home / Self Care | Payer: 59 | Source: Ambulatory Visit | Attending: Radiation Oncology | Admitting: Radiation Oncology

## 2023-09-12 ENCOUNTER — Other Ambulatory Visit: Payer: Self-pay

## 2023-09-12 VITALS — BP 134/52 | HR 85 | Temp 97.6°F | Resp 16 | Wt 152.7 lb

## 2023-09-12 DIAGNOSIS — Z87891 Personal history of nicotine dependence: Secondary | ICD-10-CM | POA: Insufficient documentation

## 2023-09-12 DIAGNOSIS — C3411 Malignant neoplasm of upper lobe, right bronchus or lung: Secondary | ICD-10-CM

## 2023-09-12 DIAGNOSIS — Z8551 Personal history of malignant neoplasm of bladder: Secondary | ICD-10-CM | POA: Diagnosis not present

## 2023-09-12 DIAGNOSIS — C61 Malignant neoplasm of prostate: Secondary | ICD-10-CM | POA: Insufficient documentation

## 2023-09-12 DIAGNOSIS — C3412 Malignant neoplasm of upper lobe, left bronchus or lung: Secondary | ICD-10-CM

## 2023-09-12 DIAGNOSIS — R229 Localized swelling, mass and lump, unspecified: Secondary | ICD-10-CM | POA: Diagnosis not present

## 2023-09-12 DIAGNOSIS — Z79630 Long term (current) use of alkylating agent: Secondary | ICD-10-CM | POA: Insufficient documentation

## 2023-09-12 DIAGNOSIS — Z8546 Personal history of malignant neoplasm of prostate: Secondary | ICD-10-CM | POA: Insufficient documentation

## 2023-09-12 DIAGNOSIS — Z5112 Encounter for antineoplastic immunotherapy: Secondary | ICD-10-CM | POA: Diagnosis not present

## 2023-09-12 DIAGNOSIS — Z5111 Encounter for antineoplastic chemotherapy: Secondary | ICD-10-CM | POA: Diagnosis not present

## 2023-09-12 DIAGNOSIS — Z79634 Long term (current) use of topoisomerase inhibitor: Secondary | ICD-10-CM | POA: Insufficient documentation

## 2023-09-12 DIAGNOSIS — Z79620 Long term (current) use of immunosuppressive biologic: Secondary | ICD-10-CM | POA: Insufficient documentation

## 2023-09-12 DIAGNOSIS — Z510 Encounter for antineoplastic radiation therapy: Secondary | ICD-10-CM | POA: Insufficient documentation

## 2023-09-12 DIAGNOSIS — Z5189 Encounter for other specified aftercare: Secondary | ICD-10-CM | POA: Diagnosis not present

## 2023-09-12 LAB — CMP (CANCER CENTER ONLY)
ALT: 9 U/L (ref 0–44)
AST: 17 U/L (ref 15–41)
Albumin: 3.8 g/dL (ref 3.5–5.0)
Alkaline Phosphatase: 67 U/L (ref 38–126)
Anion gap: 3 — ABNORMAL LOW (ref 5–15)
BUN: 15 mg/dL (ref 8–23)
CO2: 34 mmol/L — ABNORMAL HIGH (ref 22–32)
Calcium: 9.6 mg/dL (ref 8.9–10.3)
Chloride: 104 mmol/L (ref 98–111)
Creatinine: 0.83 mg/dL (ref 0.61–1.24)
GFR, Estimated: >60 mL/min (ref >60)
Glucose, Bld: 103 mg/dL — ABNORMAL HIGH (ref 70–99)
Potassium: 4.2 mmol/L (ref 3.5–5.1)
Sodium: 141 mmol/L (ref 135–145)
Total Bilirubin: 0.5 mg/dL (ref 0.0–1.2)
Total Protein: 6.6 g/dL (ref 6.5–8.1)

## 2023-09-12 LAB — CBC WITH DIFFERENTIAL (CANCER CENTER ONLY)
Abs Immature Granulocytes: 0.4 10*3/uL — ABNORMAL HIGH (ref 0.00–0.07)
Band Neutrophils: 2 %
Basophils Absolute: 0 10*3/uL (ref 0.0–0.1)
Basophils Relative: 0 %
Eosinophils Absolute: 0 10*3/uL (ref 0.0–0.5)
Eosinophils Relative: 0 %
HCT: 36.2 % — ABNORMAL LOW (ref 39.0–52.0)
Hemoglobin: 11.8 g/dL — ABNORMAL LOW (ref 13.0–17.0)
Lymphocytes Relative: 1 %
Lymphs Abs: 0.2 10*3/uL — ABNORMAL LOW (ref 0.7–4.0)
MCH: 32 pg (ref 26.0–34.0)
MCHC: 32.6 g/dL (ref 30.0–36.0)
MCV: 98.1 fL (ref 80.0–100.0)
Metamyelocytes Relative: 2 %
Monocytes Absolute: 0 10*3/uL — ABNORMAL LOW (ref 0.1–1.0)
Monocytes Relative: 0 %
Neutro Abs: 18 10*3/uL — ABNORMAL HIGH (ref 1.7–7.7)
Neutrophils Relative %: 95 %
Platelet Count: 97 10*3/uL — ABNORMAL LOW (ref 150–400)
RBC: 3.69 MIL/uL — ABNORMAL LOW (ref 4.22–5.81)
RDW: 12.2 % (ref 11.5–15.5)
Smear Review: NORMAL
WBC Count: 18.6 10*3/uL — ABNORMAL HIGH (ref 4.0–10.5)
nRBC: 0 % (ref 0.0–0.2)

## 2023-09-12 LAB — RAD ONC ARIA SESSION SUMMARY
Course Elapsed Days: 14
Plan Fractions Treated to Date: 10
Plan Prescribed Dose Per Fraction: 2.5 Gy
Plan Total Fractions Prescribed: 15
Plan Total Prescribed Dose: 37.5 Gy
Reference Point Dosage Given to Date: 25 Gy
Reference Point Session Dosage Given: 2.5 Gy
Session Number: 10

## 2023-09-13 ENCOUNTER — Ambulatory Visit
Admission: RE | Admit: 2023-09-13 | Discharge: 2023-09-13 | Disposition: A | Discharge status: Home / Self Care | Payer: 59 | Source: Ambulatory Visit | Attending: Radiation Oncology

## 2023-09-13 ENCOUNTER — Other Ambulatory Visit: Payer: 59

## 2023-09-13 ENCOUNTER — Other Ambulatory Visit: Payer: Self-pay

## 2023-09-13 DIAGNOSIS — Z8551 Personal history of malignant neoplasm of bladder: Secondary | ICD-10-CM | POA: Diagnosis not present

## 2023-09-13 DIAGNOSIS — Z5111 Encounter for antineoplastic chemotherapy: Secondary | ICD-10-CM | POA: Diagnosis not present

## 2023-09-13 DIAGNOSIS — Z5112 Encounter for antineoplastic immunotherapy: Secondary | ICD-10-CM | POA: Diagnosis not present

## 2023-09-13 DIAGNOSIS — Z87891 Personal history of nicotine dependence: Secondary | ICD-10-CM | POA: Diagnosis not present

## 2023-09-13 DIAGNOSIS — R229 Localized swelling, mass and lump, unspecified: Secondary | ICD-10-CM | POA: Diagnosis not present

## 2023-09-13 DIAGNOSIS — Z5189 Encounter for other specified aftercare: Secondary | ICD-10-CM | POA: Diagnosis not present

## 2023-09-13 DIAGNOSIS — C3412 Malignant neoplasm of upper lobe, left bronchus or lung: Secondary | ICD-10-CM | POA: Diagnosis not present

## 2023-09-13 DIAGNOSIS — Z510 Encounter for antineoplastic radiation therapy: Secondary | ICD-10-CM | POA: Diagnosis not present

## 2023-09-13 DIAGNOSIS — C3411 Malignant neoplasm of upper lobe, right bronchus or lung: Secondary | ICD-10-CM | POA: Diagnosis not present

## 2023-09-13 LAB — RAD ONC ARIA SESSION SUMMARY
Course Elapsed Days: 15
Plan Fractions Treated to Date: 11
Plan Prescribed Dose Per Fraction: 2.5 Gy
Plan Total Fractions Prescribed: 15
Plan Total Prescribed Dose: 37.5 Gy
Reference Point Dosage Given to Date: 27.5 Gy
Reference Point Session Dosage Given: 2.5 Gy
Session Number: 11

## 2023-09-14 ENCOUNTER — Other Ambulatory Visit: Payer: Self-pay

## 2023-09-14 ENCOUNTER — Ambulatory Visit
Admission: RE | Admit: 2023-09-14 | Discharge: 2023-09-14 | Disposition: A | Discharge status: Home / Self Care | Payer: 59 | Source: Ambulatory Visit | Attending: Radiation Oncology

## 2023-09-14 DIAGNOSIS — Z87891 Personal history of nicotine dependence: Secondary | ICD-10-CM | POA: Diagnosis not present

## 2023-09-14 DIAGNOSIS — R229 Localized swelling, mass and lump, unspecified: Secondary | ICD-10-CM | POA: Diagnosis not present

## 2023-09-14 DIAGNOSIS — Z5111 Encounter for antineoplastic chemotherapy: Secondary | ICD-10-CM | POA: Diagnosis not present

## 2023-09-14 DIAGNOSIS — Z8551 Personal history of malignant neoplasm of bladder: Secondary | ICD-10-CM | POA: Diagnosis not present

## 2023-09-14 DIAGNOSIS — Z510 Encounter for antineoplastic radiation therapy: Secondary | ICD-10-CM | POA: Diagnosis not present

## 2023-09-14 DIAGNOSIS — C3411 Malignant neoplasm of upper lobe, right bronchus or lung: Secondary | ICD-10-CM | POA: Diagnosis not present

## 2023-09-14 DIAGNOSIS — C3412 Malignant neoplasm of upper lobe, left bronchus or lung: Secondary | ICD-10-CM | POA: Diagnosis not present

## 2023-09-14 DIAGNOSIS — Z5112 Encounter for antineoplastic immunotherapy: Secondary | ICD-10-CM | POA: Diagnosis not present

## 2023-09-14 DIAGNOSIS — Z5189 Encounter for other specified aftercare: Secondary | ICD-10-CM | POA: Diagnosis not present

## 2023-09-14 LAB — RAD ONC ARIA SESSION SUMMARY
Course Elapsed Days: 16
Plan Fractions Treated to Date: 12
Plan Prescribed Dose Per Fraction: 2.5 Gy
Plan Total Fractions Prescribed: 15
Plan Total Prescribed Dose: 37.5 Gy
Reference Point Dosage Given to Date: 30 Gy
Reference Point Session Dosage Given: 2.5 Gy
Session Number: 12

## 2023-09-15 ENCOUNTER — Ambulatory Visit
Admission: RE | Admit: 2023-09-15 | Discharge: 2023-09-15 | Disposition: A | Discharge status: Home / Self Care | Payer: 59 | Source: Ambulatory Visit | Attending: Radiation Oncology | Admitting: Radiation Oncology

## 2023-09-15 ENCOUNTER — Other Ambulatory Visit: Payer: Self-pay

## 2023-09-15 DIAGNOSIS — Z87891 Personal history of nicotine dependence: Secondary | ICD-10-CM | POA: Diagnosis not present

## 2023-09-15 DIAGNOSIS — Z5112 Encounter for antineoplastic immunotherapy: Secondary | ICD-10-CM | POA: Diagnosis not present

## 2023-09-15 DIAGNOSIS — Z8551 Personal history of malignant neoplasm of bladder: Secondary | ICD-10-CM | POA: Diagnosis not present

## 2023-09-15 DIAGNOSIS — Z510 Encounter for antineoplastic radiation therapy: Secondary | ICD-10-CM | POA: Diagnosis not present

## 2023-09-15 DIAGNOSIS — C3412 Malignant neoplasm of upper lobe, left bronchus or lung: Secondary | ICD-10-CM | POA: Diagnosis not present

## 2023-09-15 DIAGNOSIS — C3411 Malignant neoplasm of upper lobe, right bronchus or lung: Secondary | ICD-10-CM | POA: Diagnosis not present

## 2023-09-15 DIAGNOSIS — Z5189 Encounter for other specified aftercare: Secondary | ICD-10-CM | POA: Diagnosis not present

## 2023-09-15 DIAGNOSIS — Z5111 Encounter for antineoplastic chemotherapy: Secondary | ICD-10-CM | POA: Diagnosis not present

## 2023-09-15 DIAGNOSIS — R229 Localized swelling, mass and lump, unspecified: Secondary | ICD-10-CM | POA: Diagnosis not present

## 2023-09-15 LAB — RAD ONC ARIA SESSION SUMMARY
Course Elapsed Days: 17
Plan Fractions Treated to Date: 13
Plan Prescribed Dose Per Fraction: 2.5 Gy
Plan Total Fractions Prescribed: 15
Plan Total Prescribed Dose: 37.5 Gy
Reference Point Dosage Given to Date: 32.5 Gy
Reference Point Session Dosage Given: 2.5 Gy
Session Number: 13

## 2023-09-16 ENCOUNTER — Ambulatory Visit
Admission: RE | Admit: 2023-09-16 | Discharge: 2023-09-16 | Disposition: A | Discharge status: Home / Self Care | Payer: 59 | Source: Ambulatory Visit | Attending: Radiation Oncology | Admitting: Radiation Oncology

## 2023-09-16 ENCOUNTER — Ambulatory Visit
Admission: RE | Admit: 2023-09-16 | Discharge: 2023-09-16 | Disposition: A | Discharge status: Home / Self Care | Source: Ambulatory Visit | Attending: Radiation Oncology | Admitting: Radiation Oncology

## 2023-09-16 ENCOUNTER — Other Ambulatory Visit: Payer: Self-pay

## 2023-09-16 ENCOUNTER — Other Ambulatory Visit: Payer: Self-pay | Admitting: Radiology

## 2023-09-16 DIAGNOSIS — Z5111 Encounter for antineoplastic chemotherapy: Secondary | ICD-10-CM | POA: Diagnosis not present

## 2023-09-16 DIAGNOSIS — C3411 Malignant neoplasm of upper lobe, right bronchus or lung: Secondary | ICD-10-CM | POA: Diagnosis not present

## 2023-09-16 DIAGNOSIS — C3412 Malignant neoplasm of upper lobe, left bronchus or lung: Secondary | ICD-10-CM | POA: Diagnosis not present

## 2023-09-16 DIAGNOSIS — Z8551 Personal history of malignant neoplasm of bladder: Secondary | ICD-10-CM | POA: Diagnosis not present

## 2023-09-16 DIAGNOSIS — R229 Localized swelling, mass and lump, unspecified: Secondary | ICD-10-CM | POA: Diagnosis not present

## 2023-09-16 DIAGNOSIS — Z510 Encounter for antineoplastic radiation therapy: Secondary | ICD-10-CM | POA: Diagnosis not present

## 2023-09-16 DIAGNOSIS — Z5112 Encounter for antineoplastic immunotherapy: Secondary | ICD-10-CM | POA: Diagnosis not present

## 2023-09-16 DIAGNOSIS — Z5189 Encounter for other specified aftercare: Secondary | ICD-10-CM | POA: Diagnosis not present

## 2023-09-16 DIAGNOSIS — Z87891 Personal history of nicotine dependence: Secondary | ICD-10-CM | POA: Diagnosis not present

## 2023-09-16 LAB — RAD ONC ARIA SESSION SUMMARY
Course Elapsed Days: 18
Plan Fractions Treated to Date: 14
Plan Prescribed Dose Per Fraction: 2.5 Gy
Plan Total Fractions Prescribed: 15
Plan Total Prescribed Dose: 37.5 Gy
Reference Point Dosage Given to Date: 35 Gy
Reference Point Session Dosage Given: 2.5 Gy
Session Number: 14

## 2023-09-16 NOTE — H&P (Signed)
 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  Presented with a large left hilar/mediastinal mass, multiple bilateral pulmonary nodules, prevascular lymph node, and subcutaneous nodules consistent with metastatic disease.  He was diagnosed in January 2025. 3) history of prostate cancer in 2014 4) history of urothelial carcinoma in 2017  Referred for port a cath placement to assist with treatment of lung cancer  Referring Provider(s): Mohamed,M  Supervising Physician: Oley Balm  Patient Status: [facility] - Out-pt  History of Present Illness: John [name] is a 78 y.o. 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 He is scheduled today for port a cath placement to assist with treatment of lung cancer.      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 at a level above the muscularis mucosa (per path report)   Frequency of urination 02/11/2021   History of prostate cancer 2014   dx 2013--  Stage T1c,  Gleason 3+3,  PSA 6.38,  vol 25.41cc--  s/p  radioactive seed implants 08-01-2012 urologist-  dr Ronne Binning--  last PSA 0.8  in April 2017   Hydrocele    Hypertension 02/11/2021   Mobitz type 1 second degree atrioventricular  block 08/11/2018   worked up with cardiology dr hochrein and f/u prn   Nocturia 02/11/2021   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   Wears glasses 02/11/2021    Past Surgical History:  Procedure Laterality Date   ABDOMINAL HERNIA REPAIR  1990's   BRONCHIAL BIOPSY  08/08/2023   Procedure: BRONCHIAL BIOPSIES;  Surgeon: Leslye Peer, MD;  Location: [facility] ENDOSCOPY;  Service: Pulmonary;;   BRONCHIAL BRUSHINGS  08/08/2023   Procedure: BRONCHIAL BRUSHINGS;  Surgeon: Leslye Peer, MD;  Location: [facility] ENDOSCOPY;  Service: Pulmonary;;   BRONCHIAL NEEDLE ASPIRATION BIOPSY  08/08/2023   Procedure: BRONCHIAL NEEDLE ASPIRATION BIOPSIES;  Surgeon: Leslye Peer, MD;  Location: [facility] ENDOSCOPY;  Service: Pulmonary;;   CYSTOSCOPY W/ RETROGRADES N/A 11/03/2015   Procedure: CYSTOSCOPY WITH RETROGRADE PYELOGRAM;  Surgeon: Malen Gauze, MD;  Location: [facility];  Service: Urology;  Laterality: N/A;   CYSTOSCOPY W/ RETROGRADES Left 01/09/2016   Procedure: CYSTOSCOPY WITH LEFT RETROGRADE PYELOGRAM;  Surgeon: Malen Gauze, MD;  Location: [facility];  Service: Urology;  Laterality: Left;   CYSTOSCOPY W/ RETROGRADES Bilateral 05/28/2016   Procedure: CYSTOSCOPY WITH RETROGRADE PYELOGRAM;  Surgeon: Malen Gauze, MD;  Location: [facility];  Service: Urology;  Laterality: Bilateral;   CYSTOSCOPY W/ RETROGRADES Bilateral 02/17/2021   Procedure: CYSTOSCOPY WITH RETROGRADE PYELOGRAM;  Surgeon: Belva Agee, MD;  Location: [facility];  Service: Urology;  Laterality: Bilateral;   CYSTOSCOPY W/ URETERAL STENT REMOVAL Left 01/09/2016   Procedure: CYSTOSCOPY WITH LEFT STENT EXCHANGE;  Surgeon: Malen Gauze, MD;  Location: [facility];  Service: Urology;  Laterality: Left;   CYSTOSCOPY WITH BIOPSY N/A 05/28/2016   Procedure: CYSTOSCOPY WITH BIOPSY;  Surgeon: Malen Gauze, MD;  Location: [facility];  Service: Urology;   Laterality: N/A;   CYSTOSCOPY WITH URETHRAL DILATATION N/A 05/28/2016   Procedure: CYSTOSCOPY WITH URETHRAL DILATATION;  Surgeon: Malen Gauze, MD;  Location[facility];  Service: Urology;  Laterality: N/A;   HEMOSTASIS CONTROL  08/08/2023   Procedure: HEMOSTASIS CONTROL;  Surgeon: Leslye Peer, MD;  Location: [facility] ENDOSCOPY;  Service: Pulmonary;;   PROSTATE BIOPSY  05/08/12   Adenocarcinoma   RADIOACTIVE SEED IMPLANT  08/01/2012   Procedure: RADIOACTIVE SEED IMPLANT;  Surgeon: Lindaann Slough, MD;  Location: [facility];  Service: Urology;  Laterality: N/A;  73 seeds implanted no seeds found in bladder   TRANSURETHRAL RESECTION OF BLADDER TUMOR N/A 11/03/2015   Procedure: TRANSURETHRAL RESECTION OF BLADDER TUMOR (TURBT)WITH INSERTION STENT;  Surgeon: Malen Gauze, MD;  Location: [facility];  Service: Urology;  Laterality: N/A;   TRANSURETHRAL RESECTION OF BLADDER TUMOR N/A 02/17/2021   Procedure: TRANSURETHRAL RESECTION OF BLADDER TUMOR (TURBT);  Surgeon: Belva Agee, MD;  Location: [facility];  Service: Urology;  Laterality: N/A;  30 MINS   TRANSURETHRAL RESECTION OF BLADDER TUMOR WITH GYRUS (TURBT-GYRUS) N/A 01/09/2016   Procedure: TRANSURETHRAL RESECTION OF BLADDER TUMOR WITH GYRUS (TURBT-GYRUS);  Surgeon: Malen Gauze, MD;  Location: [facility];  Service: Urology;  Laterality: N/A;   VIDEO BRONCHOSCOPY WITH RADIAL ENDOBRONCHIAL ULTRASOUND  08/08/2023   Procedure: VIDEO BRONCHOSCOPY WITH RADIAL ENDOBRONCHIAL ULTRASOUND;  Surgeon: Leslye Peer, MD;  Location: [facility] ENDOSCOPY;  Service: Pulmonary;;    Allergies: Patient has no known allergies.  Medications: Prior to Admission medications   Medication Sig Start Date End Date Taking? Authorizing Provider  albuterol (VENTOLIN HFA) 108 (90 Base) MCG/ACT inhaler Inhale 1-2 puffs into the lungs every 6 (six) hours as needed for wheezing or shortness  of breath. 08/16/23   Bevelyn Ngo, NP  amLODipine (NORVASC) 10 MG tablet Take 10 mg by mouth daily.    [provider]  COMIRNATY syringe  04/17/23   [provider]  FLUZONE HIGH-DOSE 0.5 ML injection  04/17/23   [provider]  lidocaine-prilocaine (EMLA) cream Apply 1 Application topically as needed. 08/25/23   Heilingoetter, Cassandra L, PA-C  prochlorperazine (COMPAZINE) 10 MG tablet Take 1 tablet (10 mg total) by mouth every 6 (six) hours as needed. 08/25/23   Heilingoetter, Cassandra L, PA-C     Family History  Problem Relation Age of Onset   Hyperlipidemia Mother    Asthma Mother    Heart failure Mother    Aneurysm Father    Hyperlipidemia Sister    Asthma Sister     Social History   Socioeconomic History   Marital status: Single    Spouse name: Not on file   Number of children: Not on file   Years of education: Not on file   Highest education level: Not on file  Occupational History   Not on file  Tobacco Use   Smoking status: Former    Current packs/day: 0.00    Average packs/day: 0.5 packs/day for 42.0 years (21.0 ttl pk-yrs)    Types: Cigarettes    Start date: 10/28/1972    Quit date: 10/29/2014    Years since quitting: 8.8   Smokeless tobacco: Never  Vaping Use   Vaping status:  Never Used  Substance and Sexual Activity   Alcohol use: Yes    Comment: occasionally blended whiskey    Drug use: Yes    Types: Marijuana   Sexual activity: Not on file  Other Topics Concern   Not on file  Social History Narrative   Retired.  Lives alone.     Social Drivers of Corporate investment banker Strain: Not on file  Food Insecurity: No Food Insecurity (08/18/2023)   Hunger Vital Sign    Worried About Running Out of Food in the Last Year: Never true    Ran Out of Food in the Last Year: Never true  Transportation Needs: No Transportation Needs (08/18/2023)   PRAPARE - Administrator, Civil Service (Medical): No    Lack of  Transportation (Non-Medical): No  Physical Activity: Not on file  Stress: Not on file  Social Connections: Not on file      Review of Systems: denies fever,HA,CP,dyspnea, abd pain, back pain,N/V or bleeding; he does have occ cough  Vital Signs: Vitals:   09/19/23 1253  BP: (!) 120/55  Pulse: 76  Resp: 16  Temp: 98 F (36.7 C)  SpO2: 98%      Advance Care Plan: no documents on file  Physical Exam; awake/alert; chest- CTA bilat; heart- RRR; abd-soft,+BS,NT; no sig LE edema  Imaging: NM PET Image Initial (PI) Skull Base To Thigh Result Date: 08/24/2023 CLINICAL DATA:  Initial treatment strategy for non-small cell lung cancer. EXAM: NUCLEAR MEDICINE PET SKULL BASE TO THIGH TECHNIQUE: 7.52 mCi F-18 FDG was injected intravenously. Full-ring PET imaging was performed from the skull base to thigh after the radiotracer. CT data was obtained and used for attenuation correction and anatomic localization. Fasting blood glucose: 113 mg/dl COMPARISON:  CT chest 16/04/9603 FINDINGS: Mediastinal blood pool activity: SUV max 1.51 Liver activity: SUV max NA NECK: No hypermetabolic lymph nodes in the neck. Incidental CT findings: None. CHEST: There is a large left hilar/mediastinal mass which is centered around the AP window region and extending into the left paratracheal region and left pre vascular regions. This is diffusely FDG avid. The mass measures 9.4 x 6.7 cm and has an SUV max of 10.46, image 77/4. Within the superior mediastinum there is a pre-vascular node measuring 1 cm with SUV max of 4.84. Left lower lobe subpleural nodule abutting the scratch set subpleural nodule adjacent to the posterior arch of the thoracic aorta measures 2 cm with SUV max of 6.91, image 79/4. Within the anterior left upper lobe there is a subpleural nodule measuring 1.1 cm with SUV max of 4.92. Ill-defined nodule in the posterior right upper lobe measures 2 cm with SUV max of 6.27. Adjacent nodule measures 1.5 cm with  SUV max of 2.34. Incidental CT findings: Aortic atherosclerosis. Coronary artery calcifications. Heart size is enlarged. No pericardial or pleural effusion. ABDOMEN/PELVIS: No abnormal hypermetabolic activity within the liver, pancreas, adrenal glands, or spleen. No hypermetabolic lymph nodes in the abdomen or pelvis. Incidental CT findings: Aortic atherosclerosis. Seed implants noted within the prostate gland. SKELETON: No tracer avid osseous metastatic disease. MULTIPLE SUBCUTANEOUS NODULES APPEAR TRACER AVID.  FOR EXAMPLE: MULTIPLE SUBCUTANEOUS NODULES APPEAR TRACER AVID.  FOR EXAMPLE -Within the paramidline right ventral chest wall there is a 1.5 cm nodule with SUV max of 4.25, image 82/4. -Within the right lateral chest wall there is a subcutaneous nodule measuring 1.3 cm with SUV max of 3.99. -Left lateral chest wall subcutaneous nodule measures 0.7 cm  with SUV max of 1.67, image 75/4. Incidental CT findings: None. IMPRESSION: 1. There is a large left hilar/mediastinal mass which is diffusely FDG avid and compatible with primary bronchogenic carcinoma. 2. There are multiple tracer avid bilateral pulmonary nodules compatible with metastatic disease. 3. There is a tracer avid pre-vascular lymph node compatible with metastatic disease. 4. Multiple tracer avid subcutaneous nodules compatible with metastatic disease. 5.  Aortic Atherosclerosis (ICD10-I70.0). Electronically Signed   By: Signa Kell M.D.   On: 08/24/2023 17:41   MR BRAIN W WO CONTRAST Result Date: 08/22/2023 CLINICAL DATA:  Provided history: Malignant neoplasm of unspecified part of unspecified bronchus or lung. Non-small cell lung cancer, staging. EXAM: MRI HEAD WITHOUT AND WITH CONTRAST TECHNIQUE: Multiplanar, multiecho pulse sequences of the brain and surrounding structures were obtained without and with intravenous contrast. CONTRAST:  6mL GADAVIST GADOBUTROL 1 MMOL/ML IV SOLN COMPARISON:  None. FINDINGS: Brain: Mild-to-moderate generalized  parenchymal atrophy. Multifocal T2 FLAIR hyperintense signal abnormality within the cerebral white matter and pons, nonspecific but compatible with minimal chronic small vessel ischemic disease. No cortical encephalomalacia identified. There is no acute infarct. No evidence of an intracranial mass. No chronic intracranial blood products. No extra-axial fluid collection. No midline shift. No pathologic intracranial enhancement is identified. Vascular: Maintained flow voids within the proximal large arterial vessels. Skull and upper cervical spine: No focal worrisome marrow lesion. Incompletely assessed cervical spondylosis. Sinuses/orbits: No orbital mass or acute orbital finding.Minimal mucosal thickening within the bilateral ethmoid and left sphenoid sinuses. Small mucous retention cyst within the right maxillary sinus. IMPRESSION: 1. No evidence of intracranial metastatic disease. 2. Minimal chronic small vessel ischemic changes within the cerebral white matter and pons. 3. Mild-to-moderate generalized parenchymal atrophy. 4. Paranasal sinus disease as described. Electronically Signed   By: Jackey Loge D.O.   On: 08/22/2023 09:32    Labs:  CBC: Recent Labs    08/08/23 0735 08/25/23 1251 09/06/23 0730 09/12/23 1510  WBC 8.9 10.7* 7.9 18.6*  HGB 12.7* 13.3 13.2 11.8*  HCT 38.7* 40.4 39.5 36.2*  PLT 210 262 185 97*    COAGS: No results for input(s): "INR", "APTT" in the last 8760 hours.  BMP: Recent Labs    08/08/23 0735 08/25/23 1251 09/06/23 0730 09/12/23 1510  NA 138 141 144 141  K 3.8 3.7 3.7 4.2  CL 106 105 109 104  CO2 23 32 29 34*  GLUCOSE 95 109* 98 103*  BUN 16 10 14 15   CALCIUM 9.0 9.5 9.8 9.6  CREATININE 1.12 1.12 1.04 0.83  GFRNONAA >60 >60 >60 >60    LIVER FUNCTION TESTS: Recent Labs    08/25/23 1251 09/06/23 0730 09/12/23 1510  BILITOT 0.6 0.6 0.5  AST 17 20 17   ALT 16 8 9   ALKPHOS 63 56 67  PROT 7.1 7.0 6.6  ALBUMIN 3.9 3.9 3.8    TUMOR MARKERS: No  results for input(s): "AFPTM", "CEA", "CA199", "CHROMGRNA" in the last 8760 hours.  Assessment and Plan: 78 y.o. 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 He is scheduled today for port a cath placement to assist with treatment of lung cancer.Risks and  benefits of image guided port-a-catheter placement was discussed with the patient/niece  including, but not limited to bleeding, infection, pneumothorax, or fibrin sheath development and need for additional procedures.  All of the patient's questions were answered, patient is agreeable to proceed. Consent signed and in chart.    Thank you for allowing our service to participate in RILEE WENDLING 's care.  Electronically Signed: D. Jeananne Rama, PA-C   09/16/2023, 3:55 PM      I spent a total of  30 Minutes   in face to face in clinical consultation, greater than 50% of which was counseling/coordinating care for port a cath placement

## 2023-09-17 NOTE — Progress Notes (Signed)
 [facility] OFFICE PROGRESS NOTE  Practice, [facility] [address] 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  Presented with a large left hilar/mediastinal mass, multiple bilateral pulmonary nodules, prevascular lymph node, and subcutaneous nodules consistent with metastatic disease.  He was diagnosed in January 2025. 3) history of prostate cancer in 2014 4) history of urothelial carcinoma in 2017  PRIOR THERAPY: 1) palliative course of radiation to the left lung. The last days expected on 09/16/2023. Under the care of Dr. Mitzi Hansen. Completed on 09/19/23  CURRENT THERAPY:  2) Palliative systemic chemotherapy with carboplatin for an AUC of 5 on day 1 and etoposide 100 mg/m2 on days 1, 2 and 3, and Imfinzi 1500 mg IV every 3 weeks with neulsta support. First dose on 09/06/23. Status post 1 cycle.  3) SBRT to the synchronous stage I NSCLC  INTERVAL HISTORY: John [name] 78 y.o. male returns to the clinic today for a follow-up visit accompanied by a nephew. The patient establish care in the clinic on 08/25/2023.  The patient was found to have extensive stage small cell lung cancer in addition to synchronous early-stage non-small cell lung cancer.  He is currently undergoing palliative radiation to the left lung under the care of Dr. Mitzi Hansen and SBRT to the synchronous stage I non-small cell lung cancer.  He underwent his first cycle of systemic chemotherapy and immunotherapy 3 weeks ago and tolerated it well. He had a port-a-cath placed in the interval.   Overall, he states he is feeling fine today.  He denies any fever or chills.  He reports he intermittently struggles with night sweats on and off and has been doing so for the last 5 to 6 years.  Over the last 6 months, he has lost about 40 pounds unintentionally. His appetite has been "pretty good, and I  love it". He denies shortness of breath. He denies any significant cough unless related to allergies but overall is not having allergies at this time.  He denies any hemoptysis or chest pain.  He denies any nausea, vomiting, diarrhea, or constipation.  He denies any headache or visual changes.  He is here today for evaluation before undergoing cycle #2.    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  MEDICATIONS:  Current Outpatient Medications  Medication Sig Dispense Refill   albuterol (VENTOLIN HFA) 108 (90 Base) MCG/ACT inhaler Inhale 1-2 puffs into the lungs every 6 (six) hours as needed for wheezing or shortness of breath. 8 g 2  amLODipine (NORVASC) 10 MG tablet Take 10 mg by mouth daily.     COMIRNATY syringe      FLUZONE HIGH-DOSE 0.5 ML injection      lidocaine-prilocaine (EMLA) cream Apply 1 Application topically as needed. 30 g 2   prochlorperazine (COMPAZINE) 10 MG tablet Take 1 tablet (10 mg total) by mouth every 6 (six) hours as needed. 30 tablet 2   No current facility-administered medications for this visit.    SURGICAL HISTORY:  Past Surgical History:  Procedure Laterality Date   ABDOMINAL HERNIA  REPAIR  1990's   BRONCHIAL BIOPSY  08/08/2023   Procedure: BRONCHIAL BIOPSIES;  Surgeon: Leslye Peer, MD;  Location: [facility] ENDOSCOPY;  Service: Pulmonary;;   BRONCHIAL BRUSHINGS  08/08/2023   Procedure: BRONCHIAL BRUSHINGS;  Surgeon: Leslye Peer, MD;  Location: [facility]COPY;  Service: Pulmonary;;   BRONCHIAL NEEDLE ASPIRATION BIOPSY  08/08/2023   Procedure: BRONCHIAL NEEDLE ASPIRATION BIOPSIES;  Surgeon: Leslye Peer, MD;  Location: [facility] ENDOSCOPY;  Service: Pulmonary;;   CYSTOSCOPY W/ RETROGRADES N/A 11/03/2015   Procedure: CYSTOSCOPY WITH RETROGRADE PYELOGRAM;  Surgeon: Malen Gauze, MD;  Location: [facility];  Service: Urology;  Laterality: N/A;   CYSTOSCOPY W/ RETROGRADES Left 01/09/2016   Procedure: CYSTOSCOPY WITH LEFT RETROGRADE PYELOGRAM;  Surgeon: Malen Gauze, MD;  Location: [facility];  Service: Urology;  Laterality: Left;   CYSTOSCOPY W/ RETROGRADES Bilateral 05/28/2016   Procedure: CYSTOSCOPY WITH RETROGRADE PYELOGRAM;  Surgeon: Malen Gauze, MD;  Location: [facility];  Service: Urology;  Laterality: Bilateral;   CYSTOSCOPY W/ RETROGRADES Bilateral 02/17/2021   Procedure: CYSTOSCOPY WITH RETROGRADE PYELOGRAM;  Surgeon: Belva Agee, MD;  Location: [facility];  Service: Urology;  Laterality: Bilateral;   CYSTOSCOPY W/ URETERAL STENT REMOVAL Left 01/09/2016   Procedure: CYSTOSCOPY WITH LEFT STENT EXCHANGE;  Surgeon: Malen Gauze, MD;  Location: [facility];  Service: Urology;  Laterality: Left;   CYSTOSCOPY WITH BIOPSY N/A 05/28/2016   Procedure: CYSTOSCOPY WITH BIOPSY;  Surgeon: Malen Gauze, MD;  Location: [facility];  Service: Urology;  Laterality: N/A;   CYSTOSCOPY WITH URETHRAL DILATATION N/A 05/28/2016   Procedure: CYSTOSCOPY WITH URETHRAL DILATATION;  Surgeon: Malen Gauze, MD;  Location: [facility];  Service: Urology;  Laterality:  N/A;   HEMOSTASIS CONTROL  08/08/2023   Procedure: HEMOSTASIS CONTROL;  Surgeon: Leslye Peer, MD;  Location: [facility] ENDOSCOPY;  Service: Pulmonary;;   IR IMAGING GUIDED PORT INSERTION  09/19/2023   PROSTATE BIOPSY  05/08/12   Adenocarcinoma   RADIOACTIVE SEED IMPLANT  08/01/2012   Procedure: RADIOACTIVE SEED IMPLANT;  Surgeon: Lindaann Slough, MD;  Location: [facility];  Service: Urology;  Laterality: N/A;  73 seeds implanted no seeds found in bladder   TRANSURETHRAL RESECTION OF BLADDER TUMOR N/A 11/03/2015   Procedure: TRANSURETHRAL RESECTION OF BLADDER TUMOR (TURBT)WITH INSERTION STENT;  Surgeon: Malen Gauze, MD;  Location: [facility];  Service: Urology;  Laterality: N/A;   TRANSURETHRAL RESECTION OF BLADDER TUMOR N/A 02/17/2021   Procedure: TRANSURETHRAL RESECTION OF BLADDER TUMOR (TURBT);  Surgeon: Belva Agee, MD;  Location: [facility];  Service: Urology;  Laterality: N/A;  30 MINS   TRANSURETHRAL RESECTION OF BLADDER TUMOR WITH GYRUS (TURBT-GYRUS) N/A 01/09/2016   Procedure: TRANSURETHRAL RESECTION OF BLADDER TUMOR WITH GYRUS (TURBT-GYRUS);  Surgeon: Malen Gauze, MD;  Location: [facility];  Service: Urology;  Laterality: N/A;   VIDEO BRONCHOSCOPY WITH RADIAL ENDOBRONCHIAL ULTRASOUND  08/08/2023   Procedure: VIDEO BRONCHOSCOPY WITH RADIAL ENDOBRONCHIAL ULTRASOUND;  Surgeon: Leslye Peer, MD;  Location: [facility] ENDOSCOPY;  Service: Pulmonary;;    REVIEW OF SYSTEMS:   Review of Systems  Constitutional: Negative for appetite change, chills, fatigue, fever and unexpected weight change.  HENT: Negative for mouth sores, nosebleeds, sore throat and trouble swallowing.   Eyes: Negative for eye problems and icterus.  Respiratory: Negative for cough, hemoptysis, shortness of breath and wheezing.   Cardiovascular: Negative for chest pain and leg swelling.  Gastrointestinal: Negative for abdominal pain, constipation,  diarrhea, nausea and vomiting.  Genitourinary: Negative for bladder incontinence, difficulty urinating, dysuria, frequency and hematuria.   Musculoskeletal: Negative for back pain, gait problem, neck pain and neck stiffness.  Skin: Negative for itching and rash.  Neurological: Negative for dizziness, extremity weakness, gait problem, headaches, light-headedness and seizures.  Hematological: Negative for adenopathy. Does not bruise/bleed easily.  Psychiatric/Behavioral: Negative for confusion, depression and sleep disturbance. The patient is not nervous/anxious.     PHYSICAL EXAMINATION:  Blood pressure 121/61, pulse 95, temperature 98 F (36.7 C), temperature source Temporal, resp. rate 16, weight 150 lb 4.8 oz (68.2 kg), SpO2 97%.  ECOG PERFORMANCE STATUS: 1  Physical Exam  Constitutional: Oriented to person, place, and time and thin appearing male and in no distress.  HENT:  Head: Normocephalic and atraumatic.  Mouth/Throat: Oropharynx is clear and moist. No oropharyngeal exudate.  Eyes: Conjunctivae are normal. Right eye exhibits no discharge. Left eye exhibits no discharge. No scleral icterus.  Neck: Normal range of motion. Neck supple.  Cardiovascular: Normal rate, regular rhythm, normal heart sounds and intact distal pulses.   Pulmonary/Chest: Effort normal.  Quiet breath sounds in the left lung.  No respiratory distress. No wheezes. No rales.   Abdominal: Soft. Bowel sounds are normal. Exhibits no distension and no mass. There is no tenderness.  Musculoskeletal: Normal range of motion. Exhibits no edema.  Lymphadenopathy:    No cervical adenopathy.  Neurological: Alert and oriented to person, place, and time. Exhibits normal muscle tone. Gait normal. Coordination normal.  Skin: Positive for scattered subcutaneous nodules.  Skin is warm and dry. No rash noted. Not diaphoretic. No erythema. No pallor.  Psychiatric: Mood, memory and judgment normal.  Vitals reviewed.     LABORATORY DATA: Lab Results  Component Value Date   WBC 17.2 (H) 09/26/2023   HGB 11.1 (L) 09/26/2023   HCT 33.4 (L) 09/26/2023   MCV 98.2 09/26/2023   PLT 241 09/26/2023      Chemistry      Component Value Date/Time   NA 143 09/19/2023 1030   K 3.7 09/19/2023 1030   CL 107 09/19/2023 1030   CO2 31 09/19/2023 1030   BUN 11 09/19/2023 1030   CREATININE 0.92 09/19/2023 1030      Component Value Date/Time   CALCIUM 9.4 09/19/2023 1030   ALKPHOS 86 09/19/2023 1030   AST 15 09/19/2023 1030   ALT 11 09/19/2023 1030   BILITOT 0.6 09/19/2023 1030       RADIOGRAPHIC STUDIES:  IR IMAGING GUIDED PORT INSERTION Result Date: 09/20/2023 CLINICAL DATA:  Right upper lobe lung carcinoma. Needs durable venous access for planned treatment regimen. EXAM: TUNNELED PORT CATHETER PLACEMENT WITH ULTRASOUND AND FLUOROSCOPIC GUIDANCE FLUOROSCOPY: Radiation Exposure Index (as provided by the fluoroscopic device): 1 mGy air Kerma ANESTHESIA/SEDATION: Intravenous Fentanyl and Versed 2mg  were administered by RN during a total moderate (conscious)  sedation time of 14 minutes; the patient's level of consciousness and physiological / cardiorespiratory status were monitored continuously by radiology RN under my direct supervision. TECHNIQUE: The procedure, risks, benefits, and alternatives were explained to the patient. Questions regarding the procedure were encouraged and answered. The patient understands and consents to the procedure. Patency of the right IJ vein was confirmed with ultrasound with image documentation. An appropriate skin site was determined. Skin site was marked. Region was prepped using maximum barrier technique including cap and mask, sterile gown, sterile gloves, large sterile sheet, and Chlorhexidine as cutaneous antisepsis. The region was infiltrated locally with 1% lidocaine. Under real-time ultrasound guidance, the right IJ vein was accessed with a 21 gauge micropuncture needle;  the needle tip within the vein was confirmed with ultrasound image documentation. Needle was exchanged over a 018 guidewire for transitional dilator, and vascular measurement was performed. A small incision was made on the right anterior chest wall and a subcutaneous pocket fashioned. The power-injectable port was positioned and its catheter tunneled to the right IJ dermatotomy site. The transitional dilator was exchanged over an Amplatz wire for a peel-away sheath, through which the port catheter, which had been trimmed to the appropriate length, was advanced and positioned under fluoroscopy with its tip at the cavoatrial junction. Spot chest radiograph confirms good catheter position and no pneumothorax. The port was flushed per protocol. The pocket was closed with deep interrupted and subcuticular continuous 3-0 Monocryl sutures. The incisions were covered with Dermabond then covered with a sterile dressing. The patient tolerated the procedure well. COMPLICATIONS: COMPLICATIONS None immediate IMPRESSION: Technically successful right IJ power-injectable port catheter placement. Ready for routine use. Electronically Signed   By: Corlis Leak M.D.   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  Presented with a large left hilar/mediastinal mass, multiple bilateral pulmonary nodules, prevascular lymph node, and subcutaneous nodules consistent with metastatic disease.  He was diagnosed in January 2025. 3) history of prostate cancer in 2014 4) history of urothelial carcinoma in 2017   The patient is going to start a 3-week palliative course of radiation to the left lung.  The last days on 09/19/2023.  This will be given under the care of Dr. Mitzi Hansen.    Given the small cell lung cancer in the left lung, Dr.  Arbutus Ped recommended chemotherapy with carboplatin for an AUC of 5 on day 1 and etoposide 100 mg/m2 on days 1, 2 and 3, and Imfinzi 1500 mg IV every 3 weeks with neulsta support. He started this on 09/06/23. He is status post 1 cycle and tolerated it well without any appreciable adverse side effects.   Labs were reviewed. Recommend he proceed with cycle #2 today as scheduled.    We will see him back in 3 weeks for evaluation and repeat blood work before undergoing cycle #3   He will undergo SBRT to the synchronous stage I NSCLC.   I will arrange for a restaging CT scan prior to his next cycle of treatment.   The patient was advised to call immediately if she has any concerning symptoms in the interval. The patient voices understanding of current disease status and treatment options and is in agreement with the current care plan. All questions were answered. The patient knows to call the clinic with  any problems, questions or concerns. We can certainly see the patient much sooner if necessary    Orders Placed This Encounter  Procedures   CT CHEST ABDOMEN PELVIS W CONTRAST    Standing Status:   Future    Expected Date:   10/12/2023    Expiration Date:   09/25/2024    If indicated for the ordered procedure, I authorize the administration of contrast media per Radiology protocol:   Yes    Does the patient have a contrast media/X-ray dye allergy?:   Yes    Preferred imaging location?:   [facility]    If indicated for the ordered procedure, I authorize the administration of oral contrast media per Radiology protocol:   Yes     The total time spent in the appointment was 20-29  minutes  Camdin Hegner L Hydee Fleece, PA-C 09/26/23

## 2023-09-19 ENCOUNTER — Inpatient Hospital Stay

## 2023-09-19 ENCOUNTER — Ambulatory Visit (HOSPITAL_COMMUNITY)
Admission: RE | Admit: 2023-09-19 | Discharge: 2023-09-19 | Disposition: A | Discharge status: Home / Self Care | Payer: 59 | Source: Ambulatory Visit | Attending: Physician Assistant | Admitting: Physician Assistant

## 2023-09-19 ENCOUNTER — Ambulatory Visit
Admission: RE | Admit: 2023-09-19 | Discharge: 2023-09-19 | Disposition: A | Discharge status: Home / Self Care | Source: Ambulatory Visit | Attending: Radiation Oncology

## 2023-09-19 ENCOUNTER — Other Ambulatory Visit: Payer: Self-pay

## 2023-09-19 ENCOUNTER — Ambulatory Visit: Payer: 59

## 2023-09-19 ENCOUNTER — Encounter (HOSPITAL_COMMUNITY): Payer: Self-pay

## 2023-09-19 DIAGNOSIS — L409 Psoriasis, unspecified: Secondary | ICD-10-CM | POA: Insufficient documentation

## 2023-09-19 DIAGNOSIS — I351 Nonrheumatic aortic (valve) insufficiency: Secondary | ICD-10-CM | POA: Insufficient documentation

## 2023-09-19 DIAGNOSIS — I700 Atherosclerosis of aorta: Secondary | ICD-10-CM | POA: Insufficient documentation

## 2023-09-19 DIAGNOSIS — Z5112 Encounter for antineoplastic immunotherapy: Secondary | ICD-10-CM | POA: Diagnosis not present

## 2023-09-19 DIAGNOSIS — C3411 Malignant neoplasm of upper lobe, right bronchus or lung: Secondary | ICD-10-CM

## 2023-09-19 DIAGNOSIS — Z8546 Personal history of malignant neoplasm of prostate: Secondary | ICD-10-CM | POA: Insufficient documentation

## 2023-09-19 DIAGNOSIS — C7989 Secondary malignant neoplasm of other specified sites: Secondary | ICD-10-CM | POA: Insufficient documentation

## 2023-09-19 DIAGNOSIS — R229 Localized swelling, mass and lump, unspecified: Secondary | ICD-10-CM | POA: Diagnosis not present

## 2023-09-19 DIAGNOSIS — C3402 Malignant neoplasm of left main bronchus: Secondary | ICD-10-CM | POA: Insufficient documentation

## 2023-09-19 DIAGNOSIS — Z5111 Encounter for antineoplastic chemotherapy: Secondary | ICD-10-CM | POA: Diagnosis not present

## 2023-09-19 DIAGNOSIS — I10 Essential (primary) hypertension: Secondary | ICD-10-CM | POA: Insufficient documentation

## 2023-09-19 DIAGNOSIS — Z510 Encounter for antineoplastic radiation therapy: Secondary | ICD-10-CM | POA: Diagnosis not present

## 2023-09-19 DIAGNOSIS — Z87891 Personal history of nicotine dependence: Secondary | ICD-10-CM | POA: Insufficient documentation

## 2023-09-19 DIAGNOSIS — C3412 Malignant neoplasm of upper lobe, left bronchus or lung: Secondary | ICD-10-CM | POA: Diagnosis not present

## 2023-09-19 DIAGNOSIS — Z8551 Personal history of malignant neoplasm of bladder: Secondary | ICD-10-CM | POA: Diagnosis not present

## 2023-09-19 DIAGNOSIS — Z5189 Encounter for other specified aftercare: Secondary | ICD-10-CM | POA: Diagnosis not present

## 2023-09-19 HISTORY — PX: IR IMAGING GUIDED PORT INSERTION: IMG5740

## 2023-09-19 LAB — CMP (CANCER CENTER ONLY)
ALT: 11 U/L (ref 0–44)
AST: 15 U/L (ref 15–41)
Albumin: 3.8 g/dL (ref 3.5–5.0)
Alkaline Phosphatase: 86 U/L (ref 38–126)
Anion gap: 5 (ref 5–15)
BUN: 11 mg/dL (ref 8–23)
CO2: 31 mmol/L (ref 22–32)
Calcium: 9.4 mg/dL (ref 8.9–10.3)
Chloride: 107 mmol/L (ref 98–111)
Creatinine: 0.92 mg/dL (ref 0.61–1.24)
GFR, Estimated: >60 mL/min (ref >60)
Glucose, Bld: 95 mg/dL (ref 70–99)
Potassium: 3.7 mmol/L (ref 3.5–5.1)
Sodium: 143 mmol/L (ref 135–145)
Total Bilirubin: 0.6 mg/dL (ref 0.0–1.2)
Total Protein: 6.6 g/dL (ref 6.5–8.1)

## 2023-09-19 LAB — RAD ONC ARIA SESSION SUMMARY
Course Elapsed Days: 21
Plan Fractions Treated to Date: 15
Plan Prescribed Dose Per Fraction: 2.5 Gy
Plan Total Fractions Prescribed: 15
Plan Total Prescribed Dose: 37.5 Gy
Reference Point Dosage Given to Date: 37.5 Gy
Reference Point Session Dosage Given: 2.5 Gy
Session Number: 15

## 2023-09-19 LAB — CBC WITH DIFFERENTIAL (CANCER CENTER ONLY)
Abs Immature Granulocytes: 1.45 10*3/uL — ABNORMAL HIGH (ref 0.00–0.07)
Basophils Absolute: 0 10*3/uL (ref 0.0–0.1)
Basophils Relative: 0 %
Eosinophils Absolute: 0 10*3/uL (ref 0.0–0.5)
Eosinophils Relative: 0 %
HCT: 36 % — ABNORMAL LOW (ref 39.0–52.0)
Hemoglobin: 11.7 g/dL — ABNORMAL LOW (ref 13.0–17.0)
Immature Granulocytes: 8 %
Lymphocytes Relative: 4 %
Lymphs Abs: 0.7 10*3/uL (ref 0.7–4.0)
MCH: 32.3 pg (ref 26.0–34.0)
MCHC: 32.5 g/dL (ref 30.0–36.0)
MCV: 99.4 fL (ref 80.0–100.0)
Monocytes Absolute: 1.5 10*3/uL — ABNORMAL HIGH (ref 0.1–1.0)
Monocytes Relative: 8 %
Neutro Abs: 13.8 10*3/uL — ABNORMAL HIGH (ref 1.7–7.7)
Neutrophils Relative %: 80 %
Platelet Count: 73 10*3/uL — ABNORMAL LOW (ref 150–400)
RBC: 3.62 MIL/uL — ABNORMAL LOW (ref 4.22–5.81)
RDW: 13.2 % (ref 11.5–15.5)
WBC Count: 17.4 10*3/uL — ABNORMAL HIGH (ref 4.0–10.5)
nRBC: 0.4 % — ABNORMAL HIGH (ref 0.0–0.2)

## 2023-09-19 LAB — CBC
HCT: 36.4 % — ABNORMAL LOW (ref 39.0–52.0)
Hemoglobin: 11.7 g/dL — ABNORMAL LOW (ref 13.0–17.0)
MCH: 32 pg (ref 26.0–34.0)
MCHC: 32.1 g/dL (ref 30.0–36.0)
MCV: 99.5 fL (ref 80.0–100.0)
Platelets: 75 10*3/uL — ABNORMAL LOW (ref 150–400)
RBC: 3.66 MIL/uL — ABNORMAL LOW (ref 4.22–5.81)
RDW: 13.2 % (ref 11.5–15.5)
WBC: 18.9 10*3/uL — ABNORMAL HIGH (ref 4.0–10.5)
nRBC: 0.4 % — ABNORMAL HIGH (ref 0.0–0.2)

## 2023-09-19 LAB — PROTIME-INR
INR: 1 (ref 0.8–1.2)
Prothrombin Time: 13.4 s (ref 11.4–15.2)

## 2023-09-19 MED ORDER — MIDAZOLAM HCL 2 MG/2ML IJ SOLN
INTRAMUSCULAR | Status: AC
  Filled 2023-09-19: qty 4

## 2023-09-19 MED ORDER — HEPARIN SOD (PORK) LOCK FLUSH 100 UNIT/ML IV SOLN
500.0000 [IU] | Freq: Once | INTRAVENOUS | Status: AC
  Administered 2023-09-19: 500 [IU] via INTRAVENOUS

## 2023-09-19 MED ORDER — MIDAZOLAM HCL 2 MG/2ML IJ SOLN
INTRAMUSCULAR | Status: AC | PRN
  Administered 2023-09-19 (×2): 1 mg via INTRAVENOUS

## 2023-09-19 MED ORDER — FENTANYL CITRATE (PF) 100 MCG/2ML IJ SOLN
INTRAMUSCULAR | Status: AC | PRN
  Administered 2023-09-19 (×2): 50 ug via INTRAVENOUS

## 2023-09-19 MED ORDER — HEPARIN SOD (PORK) LOCK FLUSH 100 UNIT/ML IV SOLN
INTRAVENOUS | Status: AC
  Filled 2023-09-19: qty 5

## 2023-09-19 MED ORDER — SODIUM CHLORIDE 0.9 % IV SOLN: INTRAVENOUS | Status: DC

## 2023-09-19 MED ORDER — LIDOCAINE-EPINEPHRINE 1 %-1:100000 IJ SOLN
INTRAMUSCULAR | Status: AC
  Filled 2023-09-19: qty 1

## 2023-09-19 MED ORDER — FENTANYL CITRATE (PF) 100 MCG/2ML IJ SOLN
INTRAMUSCULAR | Status: AC
  Filled 2023-09-19: qty 2

## 2023-09-19 MED ORDER — LIDOCAINE-EPINEPHRINE 1 %-1:100000 IJ SOLN
20.0000 mL | Freq: Once | INTRAMUSCULAR | Status: AC
  Administered 2023-09-19: 20 mL via INTRADERMAL

## 2023-09-19 NOTE — Progress Notes (Unsigned)
 [name] left me a VM this morning asking to confirm an appt at 12:30. I checked [name]'s chart and he does have an appt at 12:30 for his pre-procedural appt prior to his port placement at 2:30.  I called the pt back and he was concerned that he didn't see Dr.Moody today after his final treatment. I told the pt I will reach out to Rad Onc to see it and when he will be due to see Dr.Moody again and they can reach out to him to make a follow up appt.

## 2023-09-20 ENCOUNTER — Encounter: Payer: Self-pay | Admitting: Internal Medicine

## 2023-09-20 ENCOUNTER — Ambulatory Visit: Payer: 59

## 2023-09-20 NOTE — Radiation Completion Notes (Addendum)
  Radiation Oncology         [phone] ________________________________  Name: John [name] MRN: [id number]  Date of Service: 09/19/2023  DOB: [birth year]  End of Treatment Note     Diagnosis: Extensive Stage Small Cell Carcinoma of the Left lung  Intent: Curative     ==========DELIVERED PLANS==========  First Treatment Date: 2023-08-29 Last Treatment Date: 2023-09-19   Plan Name: Lung_L Site: Lung, Left Technique: 3D Mode: Photon Dose Per Fraction: 2.5 Gy Prescribed Dose (Delivered / Prescribed): 37.5 Gy / 37.5 Gy Prescribed Fxs (Delivered / Prescribed): 15 / 15     ==========ON TREATMENT VISIT DATES========== 2023-09-02, 2023-09-09, 2023-09-16    See weekly On Treatment Notes in Epic for details in the Media tab (listed as Progress notes on the On Treatment Visit Dates listed above).The patient tolerated radiation. He developed fatigue and anticipated skin changes in the treatment field.   The patient will receive a call in about one month from the radiation oncology department. He will continue follow up with Dr. Arbutus Ped as well.      Osker Comacho, PAC

## 2023-09-21 ENCOUNTER — Ambulatory Visit: Payer: 59

## 2023-09-22 ENCOUNTER — Ambulatory Visit: Payer: 59

## 2023-09-23 ENCOUNTER — Ambulatory Visit: Payer: 59

## 2023-09-23 MED FILL — Fosaprepitant Dimeglumine For IV Infusion 150 MG (Base Eq): INTRAVENOUS | Qty: 5 | Status: AC

## 2023-09-26 ENCOUNTER — Inpatient Hospital Stay: Payer: 59

## 2023-09-26 ENCOUNTER — Inpatient Hospital Stay (HOSPITAL_BASED_OUTPATIENT_CLINIC_OR_DEPARTMENT_OTHER): Payer: 59 | Admitting: Physician Assistant

## 2023-09-26 ENCOUNTER — Ambulatory Visit: Payer: 59

## 2023-09-26 VITALS — BP 121/61 | HR 95 | Temp 98.0°F | Resp 16 | Wt 150.3 lb

## 2023-09-26 DIAGNOSIS — Z5189 Encounter for other specified aftercare: Secondary | ICD-10-CM | POA: Diagnosis not present

## 2023-09-26 DIAGNOSIS — Z5111 Encounter for antineoplastic chemotherapy: Secondary | ICD-10-CM | POA: Insufficient documentation

## 2023-09-26 DIAGNOSIS — C3412 Malignant neoplasm of upper lobe, left bronchus or lung: Secondary | ICD-10-CM

## 2023-09-26 DIAGNOSIS — Z95828 Presence of other vascular implants and grafts: Secondary | ICD-10-CM | POA: Insufficient documentation

## 2023-09-26 DIAGNOSIS — Z5112 Encounter for antineoplastic immunotherapy: Secondary | ICD-10-CM | POA: Diagnosis not present

## 2023-09-26 DIAGNOSIS — C3402 Malignant neoplasm of left main bronchus: Secondary | ICD-10-CM | POA: Diagnosis not present

## 2023-09-26 DIAGNOSIS — C3411 Malignant neoplasm of upper lobe, right bronchus or lung: Secondary | ICD-10-CM | POA: Diagnosis not present

## 2023-09-26 DIAGNOSIS — R229 Localized swelling, mass and lump, unspecified: Secondary | ICD-10-CM | POA: Diagnosis not present

## 2023-09-26 DIAGNOSIS — Z8551 Personal history of malignant neoplasm of bladder: Secondary | ICD-10-CM | POA: Diagnosis not present

## 2023-09-26 LAB — CBC WITH DIFFERENTIAL (CANCER CENTER ONLY)
Abs Immature Granulocytes: 0.26 10*3/uL — ABNORMAL HIGH (ref 0.00–0.07)
Basophils Absolute: 0 10*3/uL (ref 0.0–0.1)
Basophils Relative: 0 %
Eosinophils Absolute: 0 10*3/uL (ref 0.0–0.5)
Eosinophils Relative: 0 %
HCT: 33.4 % — ABNORMAL LOW (ref 39.0–52.0)
Hemoglobin: 11.1 g/dL — ABNORMAL LOW (ref 13.0–17.0)
Immature Granulocytes: 2 %
Lymphocytes Relative: 3 %
Lymphs Abs: 0.5 10*3/uL — ABNORMAL LOW (ref 0.7–4.0)
MCH: 32.6 pg (ref 26.0–34.0)
MCHC: 33.2 g/dL (ref 30.0–36.0)
MCV: 98.2 fL (ref 80.0–100.0)
Monocytes Absolute: 1.1 10*3/uL — ABNORMAL HIGH (ref 0.1–1.0)
Monocytes Relative: 7 %
Neutro Abs: 15.3 10*3/uL — ABNORMAL HIGH (ref 1.7–7.7)
Neutrophils Relative %: 88 %
Platelet Count: 241 10*3/uL (ref 150–400)
RBC: 3.4 MIL/uL — ABNORMAL LOW (ref 4.22–5.81)
RDW: 13.6 % (ref 11.5–15.5)
WBC Count: 17.2 10*3/uL — ABNORMAL HIGH (ref 4.0–10.5)
nRBC: 0 % (ref 0.0–0.2)

## 2023-09-26 LAB — CMP (CANCER CENTER ONLY)
ALT: 16 U/L (ref 0–44)
AST: 16 U/L (ref 15–41)
Albumin: 3.6 g/dL (ref 3.5–5.0)
Alkaline Phosphatase: 77 U/L (ref 38–126)
Anion gap: 4 — ABNORMAL LOW (ref 5–15)
BUN: 14 mg/dL (ref 8–23)
CO2: 30 mmol/L (ref 22–32)
Calcium: 9.2 mg/dL (ref 8.9–10.3)
Chloride: 106 mmol/L (ref 98–111)
Creatinine: 0.99 mg/dL (ref 0.61–1.24)
GFR, Estimated: >60 mL/min (ref >60)
Glucose, Bld: 158 mg/dL — ABNORMAL HIGH (ref 70–99)
Potassium: 3.7 mmol/L (ref 3.5–5.1)
Sodium: 140 mmol/L (ref 135–145)
Total Bilirubin: 0.4 mg/dL (ref 0.0–1.2)
Total Protein: 7 g/dL (ref 6.5–8.1)

## 2023-09-26 MED ORDER — SODIUM CHLORIDE 0.9% FLUSH
10.0000 mL | INTRAVENOUS | Status: DC | PRN
  Administered 2023-09-26: 10 mL

## 2023-09-26 MED ORDER — SODIUM CHLORIDE 0.9 % IV SOLN: INTRAVENOUS | Status: DC

## 2023-09-26 MED ORDER — SODIUM CHLORIDE 0.9 % IV SOLN
1500.0000 mg | Freq: Once | INTRAVENOUS | Status: AC
  Administered 2023-09-26: 1500 mg via INTRAVENOUS
  Filled 2023-09-26: qty 30

## 2023-09-26 MED ORDER — SODIUM CHLORIDE 0.9 % IV SOLN
150.0000 mg | Freq: Once | INTRAVENOUS | Status: AC
  Administered 2023-09-26: 150 mg via INTRAVENOUS
  Filled 2023-09-26: qty 150

## 2023-09-26 MED ORDER — PALONOSETRON HCL INJECTION 0.25 MG/5ML
0.2500 mg | Freq: Once | INTRAVENOUS | Status: AC
  Administered 2023-09-26: 0.25 mg via INTRAVENOUS
  Filled 2023-09-26: qty 5

## 2023-09-26 MED ORDER — DEXAMETHASONE SODIUM PHOSPHATE 10 MG/ML IJ SOLN
10.0000 mg | Freq: Once | INTRAMUSCULAR | Status: AC
  Administered 2023-09-26: 10 mg via INTRAVENOUS
  Filled 2023-09-26: qty 1

## 2023-09-26 MED ORDER — HEPARIN SOD (PORK) LOCK FLUSH 100 UNIT/ML IV SOLN
500.0000 [IU] | Freq: Once | INTRAVENOUS | Status: AC | PRN
  Administered 2023-09-26: 500 [IU]

## 2023-09-26 MED ORDER — SODIUM CHLORIDE 0.9 % IV SOLN
387.0000 mg | Freq: Once | INTRAVENOUS | Status: AC
  Administered 2023-09-26: 390 mg via INTRAVENOUS
  Filled 2023-09-26: qty 39

## 2023-09-26 MED ORDER — SODIUM CHLORIDE 0.9% FLUSH
10.0000 mL | Freq: Once | INTRAVENOUS | Status: AC
  Administered 2023-09-26: 10 mL

## 2023-09-26 MED ORDER — SODIUM CHLORIDE 0.9 % IV SOLN
100.0000 mg/m2 | Freq: Once | INTRAVENOUS | Status: AC
  Administered 2023-09-26: 200 mg via INTRAVENOUS
  Filled 2023-09-26: qty 10

## 2023-09-26 NOTE — Patient Instructions (Signed)
 [facility]  Discharge Instructions: Thank you for choosing [facility] to provide your oncology and hematology care.   If you have a lab appointment with the Cancer Center, please go directly to the Cancer Center and check in at the registration area.   Wear comfortable clothing and clothing appropriate for easy access to any Portacath or PICC line.   We strive to give you quality time with your provider. You may need to reschedule your appointment if you arrive late (15 or more minutes).  Arriving late affects you and other patients whose appointments are after yours.  Also, if you miss three or more appointments without notifying the office, you may be dismissed from the clinic at the provider's discretion.      For prescription refill requests, have your pharmacy contact our office and allow 72 hours for refills to be completed.    Today you received the following chemotherapy and/or immunotherapy agents: Imfinzi, Carboplatin, Etoposide      To help prevent nausea and vomiting after your treatment, we encourage you to take your nausea medication as directed.  BELOW ARE SYMPTOMS THAT SHOULD BE REPORTED IMMEDIATELY: *FEVER GREATER THAN 100.4 F (38 C) OR HIGHER *CHILLS OR SWEATING *NAUSEA AND VOMITING THAT IS NOT CONTROLLED WITH YOUR NAUSEA MEDICATION *UNUSUAL SHORTNESS OF BREATH *UNUSUAL BRUISING OR BLEEDING *URINARY PROBLEMS (pain or burning when urinating, or frequent urination) *BOWEL PROBLEMS (unusual diarrhea, constipation, pain near the anus) TENDERNESS IN MOUTH AND THROAT WITH OR WITHOUT PRESENCE OF ULCERS (sore throat, sores in mouth, or a toothache) UNUSUAL RASH, SWELLING OR PAIN  UNUSUAL VAGINAL DISCHARGE OR ITCHING   Items with * indicate a potential emergency and should be followed up as soon as possible or go to the Emergency Department if any problems should occur.  Please show the CHEMOTHERAPY ALERT  CARD or IMMUNOTHERAPY ALERT CARD at check-in to the Emergency Department and triage nurse.  Should you have questions after your visit or need to cancel or reschedule your appointment, please contact [facility]  Dept: [phone]  and follow the prompts.  Office hours are 8:00 a.m. to 4:30 p.m. Monday - Friday. Please note that voicemails left after 4:00 p.m. may not be returned until the following business day.  We are closed weekends and major holidays. You have access to a nurse at all times for urgent questions. Please call the main number to the clinic Dept: [phone] and follow the prompts.   For any non-urgent questions, you may also contact your provider using MyChart. We now offer e-Visits for anyone 7 and older to request care online for non-urgent symptoms. For details visit [email or web address].   Also download the MyChart app! Go to the app store, search "MyChart", open the app, select [place], and log in with your MyChart username and password.

## 2023-09-27 ENCOUNTER — Inpatient Hospital Stay: Payer: 59

## 2023-09-27 ENCOUNTER — Ambulatory Visit: Payer: 59

## 2023-09-27 VITALS — BP 110/51 | HR 82 | Temp 97.7°F | Resp 16

## 2023-09-27 DIAGNOSIS — C3411 Malignant neoplasm of upper lobe, right bronchus or lung: Secondary | ICD-10-CM | POA: Diagnosis not present

## 2023-09-27 DIAGNOSIS — R229 Localized swelling, mass and lump, unspecified: Secondary | ICD-10-CM | POA: Diagnosis not present

## 2023-09-27 DIAGNOSIS — C3412 Malignant neoplasm of upper lobe, left bronchus or lung: Secondary | ICD-10-CM

## 2023-09-27 DIAGNOSIS — Z8551 Personal history of malignant neoplasm of bladder: Secondary | ICD-10-CM | POA: Diagnosis not present

## 2023-09-27 DIAGNOSIS — Z5112 Encounter for antineoplastic immunotherapy: Secondary | ICD-10-CM | POA: Diagnosis not present

## 2023-09-27 DIAGNOSIS — Z5189 Encounter for other specified aftercare: Secondary | ICD-10-CM | POA: Diagnosis not present

## 2023-09-27 DIAGNOSIS — Z5111 Encounter for antineoplastic chemotherapy: Secondary | ICD-10-CM | POA: Diagnosis not present

## 2023-09-27 MED ORDER — SODIUM CHLORIDE 0.9 % IV SOLN
100.0000 mg/m2 | Freq: Once | INTRAVENOUS | Status: AC
  Administered 2023-09-27: 200 mg via INTRAVENOUS
  Filled 2023-09-27: qty 10

## 2023-09-27 MED ORDER — SODIUM CHLORIDE 0.9% FLUSH
10.0000 mL | INTRAVENOUS | Status: DC | PRN
  Administered 2023-09-27: 10 mL

## 2023-09-27 MED ORDER — HEPARIN SOD (PORK) LOCK FLUSH 100 UNIT/ML IV SOLN
500.0000 [IU] | Freq: Once | INTRAVENOUS | Status: AC | PRN
  Administered 2023-09-27: 500 [IU]

## 2023-09-27 MED ORDER — DEXAMETHASONE SODIUM PHOSPHATE 10 MG/ML IJ SOLN
10.0000 mg | Freq: Once | INTRAMUSCULAR | Status: AC
  Administered 2023-09-27: 10 mg via INTRAVENOUS
  Filled 2023-09-27: qty 1

## 2023-09-27 MED ORDER — SODIUM CHLORIDE 0.9 % IV SOLN: INTRAVENOUS | Status: DC

## 2023-09-27 NOTE — Patient Instructions (Signed)
 [facility]  Discharge Instructions: Thank you for choosing [facility] to provide your oncology and hematology care.   If you have a lab appointment with the Cancer Center, please go directly to the Cancer Center and check in at the registration area.   Wear comfortable clothing and clothing appropriate for easy access to any Portacath or PICC line.   We strive to give you quality time with your provider. You may need to reschedule your appointment if you arrive late (15 or more minutes).  Arriving late affects you and other patients whose appointments are after yours.  Also, if you miss three or more appointments without notifying the office, you may be dismissed from the clinic at the provider's discretion.      For prescription refill requests, have your pharmacy contact our office and allow 72 hours for refills to be completed.    Today you received the following chemotherapy and/or immunotherapy agents: Etoposide      To help prevent nausea and vomiting after your treatment, we encourage you to take your nausea medication as directed.  BELOW ARE SYMPTOMS THAT SHOULD BE REPORTED IMMEDIATELY: *FEVER GREATER THAN 100.4 F (38 C) OR HIGHER *CHILLS OR SWEATING *NAUSEA AND VOMITING THAT IS NOT CONTROLLED WITH YOUR NAUSEA MEDICATION *UNUSUAL SHORTNESS OF BREATH *UNUSUAL BRUISING OR BLEEDING *URINARY PROBLEMS (pain or burning when urinating, or frequent urination) *BOWEL PROBLEMS (unusual diarrhea, constipation, pain near the anus) TENDERNESS IN MOUTH AND THROAT WITH OR WITHOUT PRESENCE OF ULCERS (sore throat, sores in mouth, or a toothache) UNUSUAL RASH, SWELLING OR PAIN  UNUSUAL VAGINAL DISCHARGE OR ITCHING   Items with * indicate a potential emergency and should be followed up as soon as possible or go to the Emergency Department if any problems should occur.  Please show the CHEMOTHERAPY ALERT CARD or IMMUNOTHERAPY  ALERT CARD at check-in to the Emergency Department and triage nurse.  Should you have questions after your visit or need to cancel or reschedule your appointment, please contact [facility]  Dept: [phone]  and follow the prompts.  Office hours are 8:00 a.m. to 4:30 p.m. Monday - Friday. Please note that voicemails left after 4:00 p.m. may not be returned until the following business day.  We are closed weekends and major holidays. You have access to a nurse at all times for urgent questions. Please call the main number to the clinic Dept: [phone] and follow the prompts.   For any non-urgent questions, you may also contact your provider using MyChart. We now offer e-Visits for anyone 10 and older to request care online for non-urgent symptoms. For details visit [email or web address].   Also download the MyChart app! Go to the app store, search "MyChart", open the app, select [place], and log in with your MyChart username and password.

## 2023-09-28 ENCOUNTER — Ambulatory Visit: Payer: 59

## 2023-09-28 ENCOUNTER — Inpatient Hospital Stay: Payer: 59

## 2023-09-28 VITALS — BP 122/42 | HR 73 | Temp 97.4°F | Resp 14

## 2023-09-28 DIAGNOSIS — C3412 Malignant neoplasm of upper lobe, left bronchus or lung: Secondary | ICD-10-CM

## 2023-09-28 DIAGNOSIS — Z5112 Encounter for antineoplastic immunotherapy: Secondary | ICD-10-CM | POA: Diagnosis not present

## 2023-09-28 DIAGNOSIS — R229 Localized swelling, mass and lump, unspecified: Secondary | ICD-10-CM | POA: Diagnosis not present

## 2023-09-28 DIAGNOSIS — Z5111 Encounter for antineoplastic chemotherapy: Secondary | ICD-10-CM | POA: Diagnosis not present

## 2023-09-28 DIAGNOSIS — Z5189 Encounter for other specified aftercare: Secondary | ICD-10-CM | POA: Diagnosis not present

## 2023-09-28 DIAGNOSIS — C3411 Malignant neoplasm of upper lobe, right bronchus or lung: Secondary | ICD-10-CM | POA: Diagnosis not present

## 2023-09-28 DIAGNOSIS — Z8551 Personal history of malignant neoplasm of bladder: Secondary | ICD-10-CM | POA: Diagnosis not present

## 2023-09-28 MED ORDER — SODIUM CHLORIDE 0.9 % IV SOLN
INTRAVENOUS | Status: DC
Start: 2023-09-28 — End: 2023-09-28

## 2023-09-28 MED ORDER — DEXAMETHASONE SODIUM PHOSPHATE 10 MG/ML IJ SOLN
10.0000 mg | Freq: Once | INTRAMUSCULAR | Status: AC
  Administered 2023-09-28: 10 mg via INTRAVENOUS
  Filled 2023-09-28: qty 1

## 2023-09-28 MED ORDER — SODIUM CHLORIDE 0.9 % IV SOLN
100.0000 mg/m2 | Freq: Once | INTRAVENOUS | Status: AC
  Administered 2023-09-28: 200 mg via INTRAVENOUS
  Filled 2023-09-28: qty 10

## 2023-09-28 MED ORDER — HEPARIN SOD (PORK) LOCK FLUSH 100 UNIT/ML IV SOLN
500.0000 [IU] | Freq: Once | INTRAVENOUS | Status: AC | PRN
  Administered 2023-09-28: 500 [IU]

## 2023-09-28 MED ORDER — SODIUM CHLORIDE 0.9% FLUSH
10.0000 mL | INTRAVENOUS | Status: DC | PRN
  Administered 2023-09-28: 10 mL

## 2023-09-28 NOTE — Patient Instructions (Signed)
 [facility] - A DEPT OF [facility]  Discharge Instructions: Thank you for choosing [facility] to provide your oncology and hematology care.   If you have a lab appointment with the Cancer Center, please go directly to the Cancer Center and check in at the registration area.   Wear comfortable clothing and clothing appropriate for easy access to any Portacath or PICC line.   We strive to give you quality time with your provider. You may need to reschedule your appointment if you arrive late (15 or more minutes).  Arriving late affects you and other patients whose appointments are after yours.  Also, if you miss three or more appointments without notifying the office, you may be dismissed from the clinic at the provider's discretion.      For prescription refill requests, have your pharmacy contact our office and allow 72 hours for refills to be completed.    Today you received the following chemotherapy and/or immunotherapy agents: etoposide      To help prevent nausea and vomiting after your treatment, we encourage you to take your nausea medication as directed.  BELOW ARE SYMPTOMS THAT SHOULD BE REPORTED IMMEDIATELY: *FEVER GREATER THAN 100.4 F (38 C) OR HIGHER *CHILLS OR SWEATING *NAUSEA AND VOMITING THAT IS NOT CONTROLLED WITH YOUR NAUSEA MEDICATION *UNUSUAL SHORTNESS OF BREATH *UNUSUAL BRUISING OR BLEEDING *URINARY PROBLEMS (pain or burning when urinating, or frequent urination) *BOWEL PROBLEMS (unusual diarrhea, constipation, pain near the anus) TENDERNESS IN MOUTH AND THROAT WITH OR WITHOUT PRESENCE OF ULCERS (sore throat, sores in mouth, or a toothache) UNUSUAL RASH, SWELLING OR PAIN  UNUSUAL VAGINAL DISCHARGE OR ITCHING   Items with * indicate a potential emergency and should be followed up as soon as possible or go to the Emergency Department if any problems should occur.  Please show the CHEMOTHERAPY ALERT CARD or IMMUNOTHERAPY  ALERT CARD at check-in to the Emergency Department and triage nurse.  Should you have questions after your visit or need to cancel or reschedule your appointment, please contact [facility] - A DEPT [facility]  Dept: [phone]  and follow the prompts.  Office hours are 8:00 a.m. to 4:30 p.m. Monday - Friday. Please note that voicemails left after 4:00 p.m. may not be returned until the following business day.  We are closed weekends and major holidays. You have access to a nurse at all times for urgent questions. Please call the main number to the clinic Dept: [phone] and follow the prompts.   For any non-urgent questions, you may also contact your provider using MyChart. We now offer e-Visits for anyone 40 and older to request care online for non-urgent symptoms. For details visit [email or web address].   Also download the MyChart app! Go to the app store, search "MyChart", open the app, select [place], and log in with your MyChart username and password.

## 2023-09-29 ENCOUNTER — Ambulatory Visit: Payer: 59

## 2023-09-29 ENCOUNTER — Other Ambulatory Visit: Payer: Self-pay

## 2023-09-30 ENCOUNTER — Ambulatory Visit: Payer: 59

## 2023-09-30 ENCOUNTER — Inpatient Hospital Stay: Payer: 59

## 2023-09-30 VITALS — BP 121/46 | HR 80 | Temp 98.1°F | Resp 16

## 2023-09-30 DIAGNOSIS — Z8551 Personal history of malignant neoplasm of bladder: Secondary | ICD-10-CM | POA: Diagnosis not present

## 2023-09-30 DIAGNOSIS — C3411 Malignant neoplasm of upper lobe, right bronchus or lung: Secondary | ICD-10-CM | POA: Diagnosis not present

## 2023-09-30 DIAGNOSIS — Z5111 Encounter for antineoplastic chemotherapy: Secondary | ICD-10-CM | POA: Diagnosis not present

## 2023-09-30 DIAGNOSIS — C3412 Malignant neoplasm of upper lobe, left bronchus or lung: Secondary | ICD-10-CM

## 2023-09-30 DIAGNOSIS — R229 Localized swelling, mass and lump, unspecified: Secondary | ICD-10-CM | POA: Diagnosis not present

## 2023-09-30 DIAGNOSIS — Z5189 Encounter for other specified aftercare: Secondary | ICD-10-CM | POA: Diagnosis not present

## 2023-09-30 DIAGNOSIS — Z5112 Encounter for antineoplastic immunotherapy: Secondary | ICD-10-CM | POA: Diagnosis not present

## 2023-09-30 MED ORDER — PEGFILGRASTIM-FPGK 6 MG/0.6ML ~~LOC~~ SOSY
6.0000 mg | PREFILLED_SYRINGE | Freq: Once | SUBCUTANEOUS | Status: AC
  Administered 2023-09-30: 6 mg via SUBCUTANEOUS
  Filled 2023-09-30: qty 0.6

## 2023-10-03 ENCOUNTER — Ambulatory Visit: Payer: 59

## 2023-10-03 ENCOUNTER — Inpatient Hospital Stay

## 2023-10-03 ENCOUNTER — Encounter: Payer: Self-pay | Admitting: Internal Medicine

## 2023-10-03 DIAGNOSIS — R229 Localized swelling, mass and lump, unspecified: Secondary | ICD-10-CM | POA: Diagnosis not present

## 2023-10-03 DIAGNOSIS — Z5111 Encounter for antineoplastic chemotherapy: Secondary | ICD-10-CM | POA: Diagnosis not present

## 2023-10-03 DIAGNOSIS — Z5112 Encounter for antineoplastic immunotherapy: Secondary | ICD-10-CM | POA: Diagnosis not present

## 2023-10-03 DIAGNOSIS — Z8551 Personal history of malignant neoplasm of bladder: Secondary | ICD-10-CM | POA: Diagnosis not present

## 2023-10-03 DIAGNOSIS — C3411 Malignant neoplasm of upper lobe, right bronchus or lung: Secondary | ICD-10-CM

## 2023-10-03 DIAGNOSIS — Z5189 Encounter for other specified aftercare: Secondary | ICD-10-CM | POA: Diagnosis not present

## 2023-10-03 DIAGNOSIS — Z95828 Presence of other vascular implants and grafts: Secondary | ICD-10-CM

## 2023-10-03 LAB — CBC WITH DIFFERENTIAL (CANCER CENTER ONLY)
Abs Immature Granulocytes: 1.9 10*3/uL — ABNORMAL HIGH (ref 0.00–0.07)
Basophils Absolute: 0 10*3/uL (ref 0.0–0.1)
Basophils Relative: 0 %
Eosinophils Absolute: 0 10*3/uL (ref 0.0–0.5)
Eosinophils Relative: 0 %
HCT: 30.3 % — ABNORMAL LOW (ref 39.0–52.0)
Hemoglobin: 10.1 g/dL — ABNORMAL LOW (ref 13.0–17.0)
Immature Granulocytes: 4 %
Lymphocytes Relative: 1 %
Lymphs Abs: 0.4 10*3/uL — ABNORMAL LOW (ref 0.7–4.0)
MCH: 33 pg (ref 26.0–34.0)
MCHC: 33.3 g/dL (ref 30.0–36.0)
MCV: 99 fL (ref 80.0–100.0)
Monocytes Absolute: 1 10*3/uL (ref 0.1–1.0)
Monocytes Relative: 2 %
Neutro Abs: 40.5 10*3/uL — ABNORMAL HIGH (ref 1.7–7.7)
Neutrophils Relative %: 93 %
Platelet Count: 321 10*3/uL (ref 150–400)
RBC: 3.06 MIL/uL — ABNORMAL LOW (ref 4.22–5.81)
RDW: 13.7 % (ref 11.5–15.5)
Smear Review: NORMAL
WBC Count: 43.8 10*3/uL — ABNORMAL HIGH (ref 4.0–10.5)
nRBC: 0 % (ref 0.0–0.2)

## 2023-10-03 LAB — CMP (CANCER CENTER ONLY)
ALT: 12 U/L (ref 0–44)
AST: 18 U/L (ref 15–41)
Albumin: 3.6 g/dL (ref 3.5–5.0)
Alkaline Phosphatase: 129 U/L — ABNORMAL HIGH (ref 38–126)
Anion gap: 4 — ABNORMAL LOW (ref 5–15)
BUN: 19 mg/dL (ref 8–23)
CO2: 32 mmol/L (ref 22–32)
Calcium: 9.7 mg/dL (ref 8.9–10.3)
Chloride: 103 mmol/L (ref 98–111)
Creatinine: 0.85 mg/dL (ref 0.61–1.24)
GFR, Estimated: >60 mL/min (ref >60)
Glucose, Bld: 95 mg/dL (ref 70–99)
Potassium: 4.6 mmol/L (ref 3.5–5.1)
Sodium: 139 mmol/L (ref 135–145)
Total Bilirubin: 0.4 mg/dL (ref 0.0–1.2)
Total Protein: 6.8 g/dL (ref 6.5–8.1)

## 2023-10-03 MED ORDER — HEPARIN SOD (PORK) LOCK FLUSH 100 UNIT/ML IV SOLN
500.0000 [IU] | Freq: Once | INTRAVENOUS | Status: AC
  Administered 2023-10-03: 500 [IU]

## 2023-10-03 MED ORDER — SODIUM CHLORIDE 0.9% FLUSH
10.0000 mL | Freq: Once | INTRAVENOUS | Status: AC
  Administered 2023-10-03: 10 mL

## 2023-10-04 ENCOUNTER — Ambulatory Visit: Payer: 59

## 2023-10-05 ENCOUNTER — Ambulatory Visit: Payer: 59

## 2023-10-06 ENCOUNTER — Ambulatory Visit: Payer: 59

## 2023-10-07 ENCOUNTER — Ambulatory Visit: Payer: 59

## 2023-10-10 ENCOUNTER — Inpatient Hospital Stay

## 2023-10-10 ENCOUNTER — Ambulatory Visit: Payer: 59

## 2023-10-10 DIAGNOSIS — C3411 Malignant neoplasm of upper lobe, right bronchus or lung: Secondary | ICD-10-CM | POA: Diagnosis not present

## 2023-10-10 DIAGNOSIS — Z5189 Encounter for other specified aftercare: Secondary | ICD-10-CM | POA: Diagnosis not present

## 2023-10-10 DIAGNOSIS — Z5112 Encounter for antineoplastic immunotherapy: Secondary | ICD-10-CM | POA: Diagnosis not present

## 2023-10-10 DIAGNOSIS — Z8551 Personal history of malignant neoplasm of bladder: Secondary | ICD-10-CM | POA: Diagnosis not present

## 2023-10-10 DIAGNOSIS — R229 Localized swelling, mass and lump, unspecified: Secondary | ICD-10-CM | POA: Diagnosis not present

## 2023-10-10 DIAGNOSIS — Z5111 Encounter for antineoplastic chemotherapy: Secondary | ICD-10-CM | POA: Diagnosis not present

## 2023-10-10 DIAGNOSIS — Z95828 Presence of other vascular implants and grafts: Secondary | ICD-10-CM

## 2023-10-10 LAB — CMP (CANCER CENTER ONLY)
ALT: 24 U/L (ref 0–44)
AST: 19 U/L (ref 15–41)
Albumin: 3.6 g/dL (ref 3.5–5.0)
Alkaline Phosphatase: 111 U/L (ref 38–126)
Anion gap: 7 (ref 5–15)
BUN: 14 mg/dL (ref 8–23)
CO2: 28 mmol/L (ref 22–32)
Calcium: 9.5 mg/dL (ref 8.9–10.3)
Chloride: 104 mmol/L (ref 98–111)
Creatinine: 0.94 mg/dL (ref 0.61–1.24)
GFR, Estimated: >60 mL/min (ref >60)
Glucose, Bld: 154 mg/dL — ABNORMAL HIGH (ref 70–99)
Potassium: 4.2 mmol/L (ref 3.5–5.1)
Sodium: 139 mmol/L (ref 135–145)
Total Bilirubin: 0.5 mg/dL (ref 0.0–1.2)
Total Protein: 6.9 g/dL (ref 6.5–8.1)

## 2023-10-10 LAB — CBC WITH DIFFERENTIAL (CANCER CENTER ONLY)
Abs Immature Granulocytes: 1.04 10*3/uL — ABNORMAL HIGH (ref 0.00–0.07)
Basophils Absolute: 0.1 10*3/uL (ref 0.0–0.1)
Basophils Relative: 0 %
Eosinophils Absolute: 0.1 10*3/uL (ref 0.0–0.5)
Eosinophils Relative: 0 %
HCT: 31.1 % — ABNORMAL LOW (ref 39.0–52.0)
Hemoglobin: 9.9 g/dL — ABNORMAL LOW (ref 13.0–17.0)
Immature Granulocytes: 4 %
Lymphocytes Relative: 2 %
Lymphs Abs: 0.6 10*3/uL — ABNORMAL LOW (ref 0.7–4.0)
MCH: 32.1 pg (ref 26.0–34.0)
MCHC: 31.8 g/dL (ref 30.0–36.0)
MCV: 101 fL — ABNORMAL HIGH (ref 80.0–100.0)
Monocytes Absolute: 1.8 10*3/uL — ABNORMAL HIGH (ref 0.1–1.0)
Monocytes Relative: 7 %
Neutro Abs: 21.9 10*3/uL — ABNORMAL HIGH (ref 1.7–7.7)
Neutrophils Relative %: 87 %
Platelet Count: 149 10*3/uL — ABNORMAL LOW (ref 150–400)
RBC: 3.08 MIL/uL — ABNORMAL LOW (ref 4.22–5.81)
RDW: 15.4 % (ref 11.5–15.5)
Smear Review: NORMAL
WBC Count: 25.4 10*3/uL — ABNORMAL HIGH (ref 4.0–10.5)
nRBC: 0.5 % — ABNORMAL HIGH (ref 0.0–0.2)

## 2023-10-10 MED ORDER — SODIUM CHLORIDE 0.9% FLUSH
10.0000 mL | Freq: Once | INTRAVENOUS | Status: AC
  Administered 2023-10-10: 10 mL

## 2023-10-10 MED ORDER — HEPARIN SOD (PORK) LOCK FLUSH 100 UNIT/ML IV SOLN
500.0000 [IU] | Freq: Once | INTRAVENOUS | Status: AC
  Administered 2023-10-10: 500 [IU]

## 2023-10-11 ENCOUNTER — Ambulatory Visit: Payer: 59

## 2023-10-12 ENCOUNTER — Ambulatory Visit: Payer: 59

## 2023-10-12 ENCOUNTER — Ambulatory Visit (HOSPITAL_COMMUNITY)
Admission: RE | Admit: 2023-10-12 | Discharge: 2023-10-12 | Disposition: A | Discharge status: Home / Self Care | Source: Ambulatory Visit | Attending: Physician Assistant | Admitting: Physician Assistant

## 2023-10-12 DIAGNOSIS — C3402 Malignant neoplasm of left main bronchus: Secondary | ICD-10-CM | POA: Diagnosis not present

## 2023-10-12 DIAGNOSIS — C679 Malignant neoplasm of bladder, unspecified: Secondary | ICD-10-CM | POA: Diagnosis not present

## 2023-10-12 DIAGNOSIS — J9859 Other diseases of mediastinum, not elsewhere classified: Secondary | ICD-10-CM | POA: Diagnosis not present

## 2023-10-12 MED ORDER — HEPARIN SOD (PORK) LOCK FLUSH 100 UNIT/ML IV SOLN
500.0000 [IU] | Freq: Once | INTRAVENOUS | Status: AC
  Administered 2023-10-12: 500 [IU] via INTRAVENOUS

## 2023-10-12 MED ORDER — HEPARIN SOD (PORK) LOCK FLUSH 100 UNIT/ML IV SOLN
INTRAVENOUS | Status: AC
  Filled 2023-10-12: qty 5

## 2023-10-12 MED ORDER — IOHEXOL 300 MG/ML  SOLN
100.0000 mL | Freq: Once | INTRAMUSCULAR | Status: AC | PRN
  Administered 2023-10-12: 100 mL via INTRAVENOUS

## 2023-10-12 NOTE — Addendum Note (Signed)
 Encounter addended by: Edward Qualia on: 10/12/2023 11:56 AM  Actions taken: Imaging Exam ended

## 2023-10-14 MED FILL — Fosaprepitant Dimeglumine For IV Infusion 150 MG (Base Eq): INTRAVENOUS | Qty: 5 | Status: AC

## 2023-10-14 NOTE — Progress Notes (Signed)
  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 No voicemail available.   The patient has scheduled follow up with his medical oncologist Dr. Arbutus Ped for ongoing care, and was encouraged to call if he  develops concerns or questions regarding radiation.    Ruel Favors, LPN

## 2023-10-17 ENCOUNTER — Inpatient Hospital Stay: Payer: 59 | Attending: Internal Medicine

## 2023-10-17 ENCOUNTER — Ambulatory Visit
Admission: RE | Admit: 2023-10-17 | Discharge: 2023-10-17 | Disposition: A | Discharge status: Home / Self Care | Source: Ambulatory Visit | Attending: Internal Medicine | Admitting: Internal Medicine

## 2023-10-17 ENCOUNTER — Inpatient Hospital Stay: Payer: 59

## 2023-10-17 ENCOUNTER — Inpatient Hospital Stay (HOSPITAL_BASED_OUTPATIENT_CLINIC_OR_DEPARTMENT_OTHER): Payer: 59 | Admitting: Internal Medicine

## 2023-10-17 VITALS — BP 107/49 | HR 75 | Temp 97.4°F | Resp 16 | Ht 72.0 in | Wt 152.8 lb

## 2023-10-17 VITALS — BP 116/48 | HR 84 | Temp 97.9°F | Resp 16

## 2023-10-17 DIAGNOSIS — C3412 Malignant neoplasm of upper lobe, left bronchus or lung: Secondary | ICD-10-CM

## 2023-10-17 DIAGNOSIS — Z5112 Encounter for antineoplastic immunotherapy: Secondary | ICD-10-CM | POA: Insufficient documentation

## 2023-10-17 DIAGNOSIS — Z5189 Encounter for other specified aftercare: Secondary | ICD-10-CM | POA: Diagnosis not present

## 2023-10-17 DIAGNOSIS — Z87891 Personal history of nicotine dependence: Secondary | ICD-10-CM | POA: Insufficient documentation

## 2023-10-17 DIAGNOSIS — Z8551 Personal history of malignant neoplasm of bladder: Secondary | ICD-10-CM | POA: Diagnosis not present

## 2023-10-17 DIAGNOSIS — Z79634 Long term (current) use of topoisomerase inhibitor: Secondary | ICD-10-CM | POA: Insufficient documentation

## 2023-10-17 DIAGNOSIS — Z95828 Presence of other vascular implants and grafts: Secondary | ICD-10-CM

## 2023-10-17 DIAGNOSIS — Z923 Personal history of irradiation: Secondary | ICD-10-CM | POA: Insufficient documentation

## 2023-10-17 DIAGNOSIS — Z79620 Long term (current) use of immunosuppressive biologic: Secondary | ICD-10-CM | POA: Insufficient documentation

## 2023-10-17 DIAGNOSIS — C3411 Malignant neoplasm of upper lobe, right bronchus or lung: Secondary | ICD-10-CM | POA: Insufficient documentation

## 2023-10-17 DIAGNOSIS — Z8546 Personal history of malignant neoplasm of prostate: Secondary | ICD-10-CM | POA: Insufficient documentation

## 2023-10-17 DIAGNOSIS — Z5111 Encounter for antineoplastic chemotherapy: Secondary | ICD-10-CM | POA: Insufficient documentation

## 2023-10-17 DIAGNOSIS — Z510 Encounter for antineoplastic radiation therapy: Secondary | ICD-10-CM | POA: Insufficient documentation

## 2023-10-17 DIAGNOSIS — C61 Malignant neoplasm of prostate: Secondary | ICD-10-CM | POA: Insufficient documentation

## 2023-10-17 DIAGNOSIS — Z79630 Long term (current) use of alkylating agent: Secondary | ICD-10-CM | POA: Insufficient documentation

## 2023-10-17 LAB — CBC WITH DIFFERENTIAL (CANCER CENTER ONLY)
Abs Immature Granulocytes: 0.83 10*3/uL — ABNORMAL HIGH (ref 0.00–0.07)
Basophils Absolute: 0.1 10*3/uL (ref 0.0–0.1)
Basophils Relative: 0 %
Eosinophils Absolute: 0.2 10*3/uL (ref 0.0–0.5)
Eosinophils Relative: 1 %
HCT: 28.9 % — ABNORMAL LOW (ref 39.0–52.0)
Hemoglobin: 9.4 g/dL — ABNORMAL LOW (ref 13.0–17.0)
Immature Granulocytes: 3 %
Lymphocytes Relative: 2 %
Lymphs Abs: 0.6 10*3/uL — ABNORMAL LOW (ref 0.7–4.0)
MCH: 32.6 pg (ref 26.0–34.0)
MCHC: 32.5 g/dL (ref 30.0–36.0)
MCV: 100.3 fL — ABNORMAL HIGH (ref 80.0–100.0)
Monocytes Absolute: 2 10*3/uL — ABNORMAL HIGH (ref 0.1–1.0)
Monocytes Relative: 8 %
Neutro Abs: 22.9 10*3/uL — ABNORMAL HIGH (ref 1.7–7.7)
Neutrophils Relative %: 86 %
Platelet Count: 308 10*3/uL (ref 150–400)
RBC: 2.88 MIL/uL — ABNORMAL LOW (ref 4.22–5.81)
RDW: 15.3 % (ref 11.5–15.5)
WBC Count: 26.6 10*3/uL — ABNORMAL HIGH (ref 4.0–10.5)
nRBC: 0.2 % (ref 0.0–0.2)

## 2023-10-17 LAB — CMP (CANCER CENTER ONLY)
ALT: 15 U/L (ref 0–44)
AST: 14 U/L — ABNORMAL LOW (ref 15–41)
Albumin: 3.4 g/dL — ABNORMAL LOW (ref 3.5–5.0)
Alkaline Phosphatase: 88 U/L (ref 38–126)
Anion gap: 5 (ref 5–15)
BUN: 16 mg/dL (ref 8–23)
CO2: 28 mmol/L (ref 22–32)
Calcium: 9.4 mg/dL (ref 8.9–10.3)
Chloride: 106 mmol/L (ref 98–111)
Creatinine: 1 mg/dL (ref 0.61–1.24)
GFR, Estimated: >60 mL/min (ref >60)
Glucose, Bld: 116 mg/dL — ABNORMAL HIGH (ref 70–99)
Potassium: 4.2 mmol/L (ref 3.5–5.1)
Sodium: 139 mmol/L (ref 135–145)
Total Bilirubin: 0.4 mg/dL (ref 0.0–1.2)
Total Protein: 7.2 g/dL (ref 6.5–8.1)

## 2023-10-17 LAB — TSH: TSH: 1.277 u[IU]/mL (ref 0.350–4.500)

## 2023-10-17 MED ORDER — PALONOSETRON HCL INJECTION 0.25 MG/5ML
0.2500 mg | Freq: Once | INTRAVENOUS | Status: AC
  Administered 2023-10-17: 0.25 mg via INTRAVENOUS
  Filled 2023-10-17: qty 5

## 2023-10-17 MED ORDER — SODIUM CHLORIDE 0.9 % IV SOLN
1500.0000 mg | Freq: Once | INTRAVENOUS | Status: AC
  Administered 2023-10-17: 1500 mg via INTRAVENOUS
  Filled 2023-10-17: qty 30

## 2023-10-17 MED ORDER — SODIUM CHLORIDE 0.9 % IV SOLN
100.0000 mg/m2 | Freq: Once | INTRAVENOUS | Status: AC
  Administered 2023-10-17: 200 mg via INTRAVENOUS
  Filled 2023-10-17: qty 10

## 2023-10-17 MED ORDER — DEXAMETHASONE SODIUM PHOSPHATE 10 MG/ML IJ SOLN
10.0000 mg | Freq: Once | INTRAMUSCULAR | Status: AC
  Administered 2023-10-17: 10 mg via INTRAVENOUS
  Filled 2023-10-17: qty 1

## 2023-10-17 MED ORDER — SODIUM CHLORIDE 0.9% FLUSH
10.0000 mL | Freq: Once | INTRAVENOUS | Status: AC
  Administered 2023-10-17: 10 mL

## 2023-10-17 MED ORDER — SODIUM CHLORIDE 0.9 % IV SOLN: INTRAVENOUS | Status: DC

## 2023-10-17 MED ORDER — FOSAPREPITANT DIMEGLUMINE INJECTION 150 MG
150.0000 mg | Freq: Once | INTRAVENOUS | Status: AC
  Administered 2023-10-17: 150 mg via INTRAVENOUS
  Filled 2023-10-17: qty 150

## 2023-10-17 MED ORDER — HEPARIN SOD (PORK) LOCK FLUSH 100 UNIT/ML IV SOLN
500.0000 [IU] | Freq: Once | INTRAVENOUS | Status: AC | PRN
Start: 2023-10-17 — End: 2023-10-17
  Administered 2023-10-17: 500 [IU]

## 2023-10-17 MED ORDER — SODIUM CHLORIDE 0.9 % IV SOLN
387.0000 mg | Freq: Once | INTRAVENOUS | Status: AC
  Administered 2023-10-17: 390 mg via INTRAVENOUS
  Filled 2023-10-17: qty 39

## 2023-10-17 MED ORDER — SODIUM CHLORIDE 0.9% FLUSH
10.0000 mL | INTRAVENOUS | Status: DC | PRN
  Administered 2023-10-17: 10 mL

## 2023-10-17 NOTE — Patient Instructions (Signed)
 [facility]  Discharge Instructions: Thank you for choosing [facility] to provide your oncology and hematology care.   If you have a lab appointment with the Cancer Center, please go directly to the Cancer Center and check in at the registration area.   Wear comfortable clothing and clothing appropriate for easy access to any Portacath or PICC line.   We strive to give you quality time with your provider. You may need to reschedule your appointment if you arrive late (15 or more minutes).  Arriving late affects you and other patients whose appointments are after yours.  Also, if you miss three or more appointments without notifying the office, you may be dismissed from the clinic at the provider's discretion.      For prescription refill requests, have your pharmacy contact our office and allow 72 hours for refills to be completed.    Today you received the following chemotherapy and/or immunotherapy agents: Durvalumab (Imfinzi), Carboplatin, and Etoposide.      To help prevent nausea and vomiting after your treatment, we encourage you to take your nausea medication as directed.  BELOW ARE SYMPTOMS THAT SHOULD BE REPORTED IMMEDIATELY: *FEVER GREATER THAN 100.4 F (38 C) OR HIGHER *CHILLS OR SWEATING *NAUSEA AND VOMITING THAT IS NOT CONTROLLED WITH YOUR NAUSEA MEDICATION *UNUSUAL SHORTNESS OF BREATH *UNUSUAL BRUISING OR BLEEDING *URINARY PROBLEMS (pain or burning when urinating, or frequent urination) *BOWEL PROBLEMS (unusual diarrhea, constipation, pain near the anus) TENDERNESS IN MOUTH AND THROAT WITH OR WITHOUT PRESENCE OF ULCERS (sore throat, sores in mouth, or a toothache) UNUSUAL RASH, SWELLING OR PAIN  UNUSUAL VAGINAL DISCHARGE OR ITCHING   Items with * indicate a potential emergency and should be followed up as soon as possible or go to the Emergency Department if any problems should occur.  Please show the  CHEMOTHERAPY ALERT CARD or IMMUNOTHERAPY ALERT CARD at check-in to the Emergency Department and triage nurse.  Should you have questions after your visit or need to cancel or reschedule your appointment, please contact [facility]  Dept: [phone]  and follow the prompts.  Office hours are 8:00 a.m. to 4:30 p.m. Monday - Friday. Please note that voicemails left after 4:00 p.m. may not be returned until the following business day.  We are closed weekends and major holidays. You have access to a nurse at all times for urgent questions. Please call the main number to the clinic Dept: [phone] and follow the prompts.   For any non-urgent questions, you may also contact your provider using MyChart. We now offer e-Visits for anyone 95 and older to request care online for non-urgent symptoms. For details visit [email or web address].   Also download the MyChart app! Go to the app store, search "MyChart", open the app, select [place], and log in with your MyChart username and password.

## 2023-10-17 NOTE — Patient Instructions (Signed)

## 2023-10-17 NOTE — Progress Notes (Signed)
 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  Presented with a large left hilar/mediastinal mass, multiple bilateral pulmonary nodules, prevascular lymph node, and subcutaneous nodules consistent with metastatic disease.  He was diagnosed in January 2025. 3) history of prostate cancer in 2014 4) history of urothelial carcinoma in 2017   PRIOR THERAPY: 1) palliative course of radiation to the left lung. The last days expected on 09/16/2023. Under the care of Dr. Mitzi Hansen. Completed on 09/19/23   CURRENT THERAPY:  2) Palliative systemic chemotherapy with carboplatin for an AUC of 5 on day 1 and etoposide 100 mg/m2 on days 1, 2 and 3, and Imfinzi 1500 mg IV every 3 weeks with neulsta support. First dose on 09/06/23. Status post 2 cycles.  3) SBRT to the synchronous stage I NSCLC  INTERVAL HISTORY: John [name] 78 y.o. male returns to the clinic today for follow-up visit.Discussed the use of AI scribe software for clinical note transcription with the patient, who gave verbal consent to proceed.  History of Present Illness   John [name] is a 78 year old male with adenocarcinoma of the lung who presents for evaluation and repeat CT scan for restaging before starting cycle number three of chemotherapy.  He is undergoing chemotherapy for adenocarcinoma of the lung and is here for evaluation and repeat CT scan of the chest, abdomen, and pelvis for restaging before starting the third cycle of chemotherapy. He has been doing well overall since his last visit three weeks ago.  He continues to experience hoarseness of voice. No nausea, vomiting, or diarrhea from the chemotherapy. No breathing issues reported.  He mentions experiencing a  transient rib cage discomfort last week, which lasted for about three to four seconds each day and has since resolved.  He has not experienced any weight loss and believes his weight remains around 150 pounds, noting a slight weight gain of two pounds since the last visit.      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  MEDICATIONS:  Current Outpatient Medications  Medication Sig Dispense Refill   albuterol (VENTOLIN HFA) 108 (90 Base) MCG/ACT inhaler Inhale 1-2 puffs into the lungs every 6 (six) hours as needed for wheezing or shortness of breath. 8 g 2   amLODipine (NORVASC) 10 MG tablet Take 10 mg by mouth daily.     COMIRNATY syringe  FLUZONE HIGH-DOSE 0.5 ML injection      lidocaine-prilocaine (EMLA) cream Apply 1 Application topically as needed. 30 g 2   prochlorperazine (COMPAZINE) 10 MG tablet Take 1 tablet (10 mg total) by mouth every 6 (six) hours as needed. 30 tablet 2   No current facility-administered medications for this visit.    SURGICAL HISTORY:  Past Surgical History:  Procedure Laterality Date   ABDOMINAL HERNIA REPAIR  1990's   BRONCHIAL BIOPSY  08/08/2023   Procedure: BRONCHIAL BIOPSIES;   Surgeon: Leslye Peer, MD;  Location: [facility] ENDOSCOPY;  Service: Pulmonary;;   BRONCHIAL BRUSHINGS  08/08/2023   Procedure: BRONCHIAL BRUSHINGS;  Surgeon: Leslye Peer, MD;  Location: [facility] ENDOSCOPY;  Service: Pulmonary;;   BRONCHIAL NEEDLE ASPIRATION BIOPSY  08/08/2023   Procedure: BRONCHIAL NEEDLE ASPIRATION BIOPSIES;  Surgeon: Leslye Peer, MD;  Location: [facility] ENDOSCOPY;  Service: Pulmonary;;   CYSTOSCOPY W/ RETROGRADES N/A 11/03/2015   Procedure: CYSTOSCOPY WITH RETROGRADE PYELOGRAM;  Surgeon: Malen Gauze, MD;  Location: [facility];  Service: Urology;  Laterality: N/A;   CYSTOSCOPY W/ RETROGRADES Left 01/09/2016   Procedure: CYSTOSCOPY WITH LEFT RETROGRADE PYELOGRAM;  Surgeon: Malen Gauze, MD;  Location: [facility];  Service: Urology;  Laterality: Left;   CYSTOSCOPY W/ RETROGRADES Bilateral 05/28/2016   Procedure: CYSTOSCOPY WITH RETROGRADE PYELOGRAM;  Surgeon: Malen Gauze, MD;  Location: [facility];  Service: Urology;  Laterality: Bilateral;   CYSTOSCOPY W/ RETROGRADES Bilateral 02/17/2021   Procedure: CYSTOSCOPY WITH RETROGRADE PYELOGRAM;  Surgeon: Belva Agee, MD;  Location: [facility];  Service: Urology;  Laterality: Bilateral;   CYSTOSCOPY W/ URETERAL STENT REMOVAL Left 01/09/2016   Procedure: CYSTOSCOPY WITH LEFT STENT EXCHANGE;  Surgeon: Malen Gauze, MD;  Location: [facility];  Service: Urology;  Laterality: Left;   CYSTOSCOPY WITH BIOPSY N/A 05/28/2016   Procedure: CYSTOSCOPY WITH BIOPSY;  Surgeon: Malen Gauze, MD;  Location: [facility];  Service: Urology;  Laterality: N/A;   CYSTOSCOPY WITH URETHRAL DILATATION N/A 05/28/2016   Procedure: CYSTOSCOPY WITH URETHRAL DILATATION;  Surgeon: Malen Gauze, MD;  Location: [facility];  Service: Urology;  Laterality: N/A;   HEMOSTASIS CONTROL  08/08/2023   Procedure: HEMOSTASIS CONTROL;   Surgeon: Leslye Peer, MD;  Location: [facility] ENDOSCOPY;  Service: Pulmonary;;   IR IMAGING GUIDED PORT INSERTION  09/19/2023   PROSTATE BIOPSY  05/08/12   Adenocarcinoma   RADIOACTIVE SEED IMPLANT  08/01/2012   Procedure: RADIOACTIVE SEED IMPLANT;  Surgeon: Lindaann Slough, MD;  Location: [facility];  Service: Urology;  Laterality: N/A;  73 seeds implanted no seeds found in bladder   TRANSURETHRAL RESECTION OF BLADDER TUMOR N/A 11/03/2015   Procedure: TRANSURETHRAL RESECTION OF BLADDER TUMOR (TURBT)WITH INSERTION STENT;  Surgeon: Malen Gauze, MD;  Location: [facility];  Service: Urology;  Laterality: N/A;   TRANSURETHRAL RESECTION OF BLADDER TUMOR N/A 02/17/2021   Procedure: TRANSURETHRAL RESECTION OF BLADDER TUMOR (TURBT);  Surgeon: Belva Agee, MD;  Location: [facility];  Service: Urology;  Laterality: N/A;  30 MINS   TRANSURETHRAL RESECTION OF BLADDER TUMOR WITH GYRUS (TURBT-GYRUS) N/A 01/09/2016   Procedure: TRANSURETHRAL RESECTION OF BLADDER TUMOR WITH GYRUS (TURBT-GYRUS);  Surgeon: Malen Gauze, MD;  Location: [facility];  Service: Urology;  Laterality: N/A;   VIDEO BRONCHOSCOPY WITH RADIAL ENDOBRONCHIAL ULTRASOUND  08/08/2023   Procedure: VIDEO BRONCHOSCOPY WITH RADIAL ENDOBRONCHIAL ULTRASOUND;  Surgeon: Leslye Peer, MD;  Location: [facility] ENDOSCOPY;  Service: Pulmonary;;    REVIEW OF SYSTEMS:  Constitutional: negative Eyes: negative Ears, nose, mouth, throat, and face: negative Respiratory: positive for dyspnea on exertion Cardiovascular: negative Gastrointestinal: negative Genitourinary:negative Integument/breast: negative Hematologic/lymphatic: negative Musculoskeletal:negative Neurological: negative Behavioral/Psych: negative Endocrine: negative Allergic/Immunologic: negative   PHYSICAL EXAMINATION: General appearance: alert, cooperative, fatigued, and no distress Head: Normocephalic, without obvious  abnormality, atraumatic Neck: no adenopathy, no JVD, supple, symmetrical, trachea midline, and thyroid not enlarged, symmetric, no tenderness/mass/nodules Lymph nodes: Cervical, supraclavicular, and axillary nodes normal. Resp: clear to auscultation bilaterally Back: symmetric, no curvature. ROM normal. No CVA tenderness. Cardio: regular rate and rhythm, S1, S2 normal, no murmur, click, rub or gallop GI: soft, non-tender; bowel sounds normal; no masses,  no organomegaly Extremities: extremities normal, atraumatic, no cyanosis or edema Neurologic: Alert and oriented X 3, normal strength and tone. Normal symmetric reflexes. Normal coordination and gait  ECOG PERFORMANCE STATUS: 1 - Symptomatic but completely ambulatory  Blood pressure (!) 107/49, pulse 75, temperature (!) 97.4 F (36.3 C), temperature source Temporal, resp. rate 16, height 6' (1.829 m), weight 152 lb 12.8 oz (69.3 kg), SpO2 100%.  LABORATORY DATA: Lab Results  Component Value Date   WBC 26.6 (H) 10/17/2023   HGB 9.4 (L) 10/17/2023   HCT 28.9 (L) 10/17/2023   MCV 100.3 (H) 10/17/2023   PLT 308 10/17/2023      Chemistry      Component Value Date/Time   NA 139 10/10/2023 1129   K 4.2 10/10/2023 1129   CL 104 10/10/2023 1129   CO2 28 10/10/2023 1129   BUN 14 10/10/2023 1129   CREATININE 0.94 10/10/2023 1129      Component Value Date/Time   CALCIUM 9.5 10/10/2023 1129   ALKPHOS 111 10/10/2023 1129   AST 19 10/10/2023 1129   ALT 24 10/10/2023 1129   BILITOT 0.5 10/10/2023 1129       RADIOGRAPHIC STUDIES: CT CHEST ABDOMEN PELVIS W CONTRAST Result Date: 10/14/2023 CLINICAL DATA:  Assess treatment response small-cell carcinoma of hilum of the left lung. Also history of bladder and prostate cancer. * Tracking Code: BO * EXAM: CT CHEST, ABDOMEN, AND PELVIS WITH CONTRAST TECHNIQUE: Multidetector CT imaging of the chest, abdomen and pelvis was performed following the standard protocol during bolus administration of  intravenous contrast. RADIATION DOSE REDUCTION: This exam was performed according to the departmental dose-optimization program which includes automated exposure control, adjustment of the mA and/or kV according to patient size and/or use of iterative reconstruction technique. CONTRAST:  OMNIPAQUE IOHEXOL 300 MG/ML  SOLN COMPARISON:  PET-CT scan 08/19/2023.  Noncontrast CT 08/03/2023. FINDINGS: CT CHEST FINDINGS Cardiovascular: Right IJ chest port is accessed. Tip of the catheter extends to the upper right atrium. Heart is nonenlarged. Trace pericardial fluid. Coronary artery calcifications are seen. The thoracic aorta has normal course and caliber with mild atherosclerotic calcified plaque. There is a bovine type aortic arch, normal variant. Somewhat small right brachiocephalic vein, unchanged. Mediastinum/Nodes: Slightly patulous thoracic esophagus. Slightly small thyroid gland. No discrete abnormal lymph node enlargement identified in the axillary regions. Prior exams demonstrated a large confluent mass centered along the AP window extension along the hilum and deeper into the mediastinum. On the prior this measured 9.4 x 6.7 cm and today when measured on series 2, image 25 measures 4.2 by 6.2 cm. In addition there is some hypermetabolic nodes identified in the more superior aspect of the left-sided mediastinum including an node seen  anterior to the left subclavian artery which previously measured 10 mm and today is barely visible with a small area of tissue on image 20 of series 2 measuring 6 mm. Previously there is also a nodular focus medially adjacent to the descending thoracic aorta which is hypermetabolic. AP dimension of this lesion at that time would have measured 2.1 cm. Today there is only minimal thickening in this location on series 2, image 32. No new nodal enlargement along the right hilum. Some small nodes are seen in this location, less than a cm in short axis. No new mediastinal nodal  enlargement. Lungs/Pleura: Scattered peripheral areas of scarring and fibrotic changes along the lungs with interstitial septal thickening. Paraseptal and centrilobular emphysematous changes are also identified. No pneumothorax or effusion. No new areas of consolidation. The spiculated right upper lobe lung nodules again seen. Today on series 4, image 57 this lesion which abuts the major fissure measures 2.3 by 2.1 cm. Previously when measured in same fashion is today the lesion would have measured 2.9 by 2.2 cm. Again this was hypermetabolic. No new aggressive liver lesions are identified at this time. In addition there was a focus of abnormal uptake and nodular tissue immediately posterior to the anterior margin left first rib with abnormal uptake. Minimal thickening in this location remains measured at a thickness of 3 mm on series 4, image 27. Musculoskeletal: Scattered degenerative changes along the spine. On the prior there were hypermetabolic soft tissue nodules identified along the thorax. Example along the right anterior hemithorax immediately anterior to the right third rib is a focus today on series 2, image 30 measuring 16 by 10 mm. Previously transverse dimension of 15 mm, similar. Additional focus superficial to the right scapular musculature. Today small area of nodular tissue in this location on series 2, image 24 measures 12 by 4 mm. Previously larger at 14 by 7 mm. No definite new soft tissue nodule identified along the thorax. CT ABDOMEN PELVIS FINDINGS Hepatobiliary: No focal liver abnormality is seen. No gallstones, gallbladder wall thickening, or biliary dilatation. Patent portal vein Pancreas: Unremarkable. No pancreatic ductal dilatation or surrounding inflammatory changes. Spleen: Normal in size without focal abnormality. Adrenals/Urinary Tract: Adrenal glands are preserved. Moderate atrophy of the left kidney with lower pole parapelvic cysts and some parenchymal CIS and presumed calyceal  diverticula. No enhancing renal mass. Small right-sided benign parenchymal cyst as well. The ureters have a normal course and caliber extending down to the urinary bladder. Preserved contour to the urinary bladder. Stomach/Bowel: On this non oral contrast exam, large bowel has a normal course and caliber with significant colonic stool. Normal appendix seen retrocecal in the right hemipelvis. Stomach and small bowel have a normal course and caliber. There are several fluid-filled loops of nondilated small bowel in the left mid abdomen and pelvis. Vascular/Lymphatic: Aortic atherosclerosis. No enlarged abdominal or pelvic lymph nodes. Reproductive: Brachytherapy changes along the prostate. Other: No free air or free fluid. Musculoskeletal: Mild degenerative changes along the spine. Scattered degenerative changes well along the hip joints, right-greater-than-left. Curvature of the spine. Areas of disc bulging and encroachment. Small area of soft tissue nodularity along the anterior abdominal wall on the left side on series 2, image 60 and image 75 are unchanged from previous and were not hypermetabolic. IMPRESSION: Overall significant interval improvement. Marked decrease in size of the confluent mass along left side of the mediastinum. There is also decrease in size of mediastinal lymph nodes and areas of pleural masses along left  hemithorax including adjacent to the descending thoracic aorta and posterior to the left first rib. Right upper lobe spiculated lung mass is slightly smaller today. Soft tissue hypermetabolic lesions along the right hemithorax are again seen. The lateral lesion is smaller. The anterior lesion is similar. No new nodal enlargement, mass lesion. Atrophy of the left kidney with areas of calyceal diverticula. Brachytherapy changes of the prostate. Emphysematous lung changes. Electronically Signed   By: Karen Kays M.D.   On: 10/14/2023 14:46   IR IMAGING GUIDED PORT INSERTION Result Date:  09/20/2023 CLINICAL DATA:  Right upper lobe lung carcinoma. Needs durable venous access for planned treatment regimen. EXAM: TUNNELED PORT CATHETER PLACEMENT WITH ULTRASOUND AND FLUOROSCOPIC GUIDANCE FLUOROSCOPY: Radiation Exposure Index (as provided by the fluoroscopic device): 1 mGy air Kerma ANESTHESIA/SEDATION: Intravenous Fentanyl and Versed 2mg  were administered by RN during a total moderate (conscious) sedation time of 14 minutes; the patient's level of consciousness and physiological / cardiorespiratory status were monitored continuously by radiology RN under my direct supervision. TECHNIQUE: The procedure, risks, benefits, and alternatives were explained to the patient. Questions regarding the procedure were encouraged and answered. The patient understands and consents to the procedure. Patency of the right IJ vein was confirmed with ultrasound with image documentation. An appropriate skin site was determined. Skin site was marked. Region was prepped using maximum barrier technique including cap and mask, sterile gown, sterile gloves, large sterile sheet, and Chlorhexidine as cutaneous antisepsis. The region was infiltrated locally with 1% lidocaine. Under real-time ultrasound guidance, the right IJ vein was accessed with a 21 gauge micropuncture needle; the needle tip within the vein was confirmed with ultrasound image documentation. Needle was exchanged over a 018 guidewire for transitional dilator, and vascular measurement was performed. A small incision was made on the right anterior chest wall and a subcutaneous pocket fashioned. The power-injectable port was positioned and its catheter tunneled to the right IJ dermatotomy site. The transitional dilator was exchanged over an Amplatz wire for a peel-away sheath, through which the port catheter, which had been trimmed to the appropriate length, was advanced and positioned under fluoroscopy with its tip at the cavoatrial junction. Spot chest  radiograph confirms good catheter position and no pneumothorax. The port was flushed per protocol. The pocket was closed with deep interrupted and subcuticular continuous 3-0 Monocryl sutures. The incisions were covered with Dermabond then covered with a sterile dressing. The patient tolerated the procedure well. COMPLICATIONS: COMPLICATIONS None immediate IMPRESSION: Technically successful right IJ power-injectable port catheter placement. Ready for routine use. Electronically Signed   By: Corlis Leak M.D.   On: 09/20/2023 11:49    ASSESSMENT AND PLAN:  This is a very pleasant 78 years old African-American male with  extensive stage small cell lung cancer.  Presented with a large left hilar/mediastinal mass, multiple bilateral pulmonary nodules, prevascular lymph node, and subcutaneous nodules consistent with metastatic disease.  He was diagnosed in January 2025.  He also has a history of a stage I adenocarcinoma of the right upper lobe status post SBRT. He is currently undergoing systemic chemotherapy with carboplatin for AUC of 5 on day 1, etoposide 100 Mg/M2 on days 1, 2 and 3 with Neulasta support in addition to Imfinzi 1500 Mg IV on day 1 every 3 weeks.  He is status post 2 cycles.    Extensive stage small cell lung cancer.   Presented with a large left hilar/mediastinal mass, multiple bilateral pulmonary nodules, prevascular lymph node, and subcutaneous nodules consistent with  metastatic disease.  He was diagnosed in January 2025. He is currently undergoing Palliative systemic chemotherapy with carboplatin for an AUC of 5 on day 1 and etoposide 100 mg/m2 on days 1, 2 and 3, and Imfinzi 1500 mg IV every 3 weeks with neulsta support. First dose on 09/06/23. Status post 2 cycles and he completed SBRT to the synchronous stage I NSCLC CT scan shows marked decrease in the size of the confluent mass along the left mediastinum, mediastinal lymph nodes, and pleural masses along the left hemothorax. The right  upper lobe nodule, previously identified as adenocarcinoma, has also reduced in size with radiation therapy. The left chest mass decreased from 9.4 x 6.7 cm to 4.2 x 6.2 cm, indicating a positive response to treatment. No significant chemotherapy side effects reported, such as nausea, vomiting, or diarrhea. Slight transient rib cage discomfort noted but resolved. - Proceed with cycle three of chemotherapy today. - Schedule follow-up in three weeks for evaluation and cycle four of chemotherapy.   The patient was advised to call immediately if he has any other concerning symptoms in the interval. The patient voices understanding of current disease status and treatment options and is in agreement with the current care plan.  All questions were answered. The patient knows to call the clinic with any problems, questions or concerns. We can certainly see the patient much sooner if necessary.  The total time spent in the appointment was 30 minutes.  Disclaimer: This note was dictated with voice recognition software. Similar sounding words can inadvertently be transcribed and may not be corrected upon review.

## 2023-10-18 ENCOUNTER — Inpatient Hospital Stay: Payer: 59

## 2023-10-18 VITALS — BP 106/51 | HR 61 | Temp 97.6°F | Resp 18

## 2023-10-18 DIAGNOSIS — Z5111 Encounter for antineoplastic chemotherapy: Secondary | ICD-10-CM | POA: Diagnosis not present

## 2023-10-18 DIAGNOSIS — C3412 Malignant neoplasm of upper lobe, left bronchus or lung: Secondary | ICD-10-CM

## 2023-10-18 DIAGNOSIS — Z5189 Encounter for other specified aftercare: Secondary | ICD-10-CM | POA: Diagnosis not present

## 2023-10-18 DIAGNOSIS — Z5112 Encounter for antineoplastic immunotherapy: Secondary | ICD-10-CM | POA: Diagnosis not present

## 2023-10-18 DIAGNOSIS — Z79634 Long term (current) use of topoisomerase inhibitor: Secondary | ICD-10-CM | POA: Diagnosis not present

## 2023-10-18 DIAGNOSIS — Z923 Personal history of irradiation: Secondary | ICD-10-CM | POA: Diagnosis not present

## 2023-10-18 DIAGNOSIS — Z79620 Long term (current) use of immunosuppressive biologic: Secondary | ICD-10-CM | POA: Diagnosis not present

## 2023-10-18 DIAGNOSIS — C3411 Malignant neoplasm of upper lobe, right bronchus or lung: Secondary | ICD-10-CM | POA: Diagnosis not present

## 2023-10-18 DIAGNOSIS — Z8551 Personal history of malignant neoplasm of bladder: Secondary | ICD-10-CM | POA: Diagnosis not present

## 2023-10-18 LAB — T4: T4, Total: 7.9 ug/dL (ref 4.5–12.0)

## 2023-10-18 MED ORDER — SODIUM CHLORIDE 0.9 % IV SOLN
INTRAVENOUS | Status: DC
Start: 2023-10-18 — End: 2023-10-18

## 2023-10-18 MED ORDER — DEXAMETHASONE SODIUM PHOSPHATE 10 MG/ML IJ SOLN
10.0000 mg | Freq: Once | INTRAMUSCULAR | Status: AC
  Administered 2023-10-18: 10 mg via INTRAVENOUS
  Filled 2023-10-18: qty 1

## 2023-10-18 MED ORDER — ETOPOSIDE CHEMO INJECTION 1 GM/50ML
100.0000 mg/m2 | Freq: Once | INTRAVENOUS | Status: AC
  Administered 2023-10-18: 200 mg via INTRAVENOUS
  Filled 2023-10-18: qty 10

## 2023-10-19 ENCOUNTER — Inpatient Hospital Stay: Payer: 59

## 2023-10-19 VITALS — BP 115/51 | HR 70 | Temp 97.8°F | Resp 16

## 2023-10-19 DIAGNOSIS — Z5189 Encounter for other specified aftercare: Secondary | ICD-10-CM | POA: Diagnosis not present

## 2023-10-19 DIAGNOSIS — Z5111 Encounter for antineoplastic chemotherapy: Secondary | ICD-10-CM | POA: Diagnosis not present

## 2023-10-19 DIAGNOSIS — C3411 Malignant neoplasm of upper lobe, right bronchus or lung: Secondary | ICD-10-CM | POA: Diagnosis not present

## 2023-10-19 DIAGNOSIS — Z5112 Encounter for antineoplastic immunotherapy: Secondary | ICD-10-CM | POA: Diagnosis not present

## 2023-10-19 DIAGNOSIS — C3412 Malignant neoplasm of upper lobe, left bronchus or lung: Secondary | ICD-10-CM

## 2023-10-19 DIAGNOSIS — Z923 Personal history of irradiation: Secondary | ICD-10-CM | POA: Diagnosis not present

## 2023-10-19 DIAGNOSIS — Z79620 Long term (current) use of immunosuppressive biologic: Secondary | ICD-10-CM | POA: Diagnosis not present

## 2023-10-19 DIAGNOSIS — Z79634 Long term (current) use of topoisomerase inhibitor: Secondary | ICD-10-CM | POA: Diagnosis not present

## 2023-10-19 DIAGNOSIS — Z8551 Personal history of malignant neoplasm of bladder: Secondary | ICD-10-CM | POA: Diagnosis not present

## 2023-10-19 MED ORDER — SODIUM CHLORIDE 0.9% FLUSH: 10.0000 mL | INTRAVENOUS | Status: DC | PRN

## 2023-10-19 MED ORDER — SODIUM CHLORIDE 0.9 % IV SOLN
100.0000 mg/m2 | Freq: Once | INTRAVENOUS | Status: AC
  Administered 2023-10-19: 200 mg via INTRAVENOUS
  Filled 2023-10-19: qty 10

## 2023-10-19 MED ORDER — SODIUM CHLORIDE 0.9 % IV SOLN: INTRAVENOUS | Status: DC

## 2023-10-19 MED ORDER — HEPARIN SOD (PORK) LOCK FLUSH 100 UNIT/ML IV SOLN: 500.0000 [IU] | Freq: Once | INTRAVENOUS | Status: DC | PRN

## 2023-10-19 MED ORDER — DEXAMETHASONE SODIUM PHOSPHATE 10 MG/ML IJ SOLN
10.0000 mg | Freq: Once | INTRAMUSCULAR | Status: AC
  Administered 2023-10-19: 10 mg via INTRAVENOUS
  Filled 2023-10-19: qty 1

## 2023-10-19 NOTE — Patient Instructions (Signed)
 [facility] - A DEPT OF [facility]  Discharge Instructions: Thank you for choosing [facility] to provide your oncology and hematology care.   If you have a lab appointment with the Cancer Center, please go directly to the Cancer Center and check in at the registration area.   Wear comfortable clothing and clothing appropriate for easy access to any Portacath or PICC line.   We strive to give you quality time with your provider. You may need to reschedule your appointment if you arrive late (15 or more minutes).  Arriving late affects you and other patients whose appointments are after yours.  Also, if you miss three or more appointments without notifying the office, you may be dismissed from the clinic at the provider's discretion.      For prescription refill requests, have your pharmacy contact our office and allow 72 hours for refills to be completed.    Today you received the following chemotherapy and/or immunotherapy agents vepesid      To help prevent nausea and vomiting after your treatment, we encourage you to take your nausea medication as directed.  BELOW ARE SYMPTOMS THAT SHOULD BE REPORTED IMMEDIATELY: *FEVER GREATER THAN 100.4 F (38 C) OR HIGHER *CHILLS OR SWEATING *NAUSEA AND VOMITING THAT IS NOT CONTROLLED WITH YOUR NAUSEA MEDICATION *UNUSUAL SHORTNESS OF BREATH *UNUSUAL BRUISING OR BLEEDING *URINARY PROBLEMS (pain or burning when urinating, or frequent urination) *BOWEL PROBLEMS (unusual diarrhea, constipation, pain near the anus) TENDERNESS IN MOUTH AND THROAT WITH OR WITHOUT PRESENCE OF ULCERS (sore throat, sores in mouth, or a toothache) UNUSUAL RASH, SWELLING OR PAIN  UNUSUAL VAGINAL DISCHARGE OR ITCHING   Items with * indicate a potential emergency and should be followed up as soon as possible or go to the Emergency Department if any problems should occur.  Please show the CHEMOTHERAPY ALERT CARD or IMMUNOTHERAPY  ALERT CARD at check-in to the Emergency Department and triage nurse.  Should you have questions after your visit or need to cancel or reschedule your appointment, please contact [facility] - A DEPT OF [facility]  Dept: [phone]  and follow the prompts.  Office hours are 8:00 a.m. to 4:30 p.m. Monday - Friday. Please note that voicemails left after 4:00 p.m. may not be returned until the following business day.  We are closed weekends and major holidays. You have access to a nurse at all times for urgent questions. Please call the main number to the clinic Dept: [phone] and follow the prompts.   For any non-urgent questions, you may also contact your provider using MyChart. We now offer e-Visits for anyone 27 and older to request care online for non-urgent symptoms. For details visit [email or web address].   Also download the MyChart app! Go to the app store, search "MyChart", open the app, select [place], and log in with your MyChart username and password.

## 2023-10-21 ENCOUNTER — Inpatient Hospital Stay: Payer: 59

## 2023-10-21 VITALS — BP 110/53 | HR 78 | Temp 98.0°F | Resp 17

## 2023-10-21 DIAGNOSIS — Z79634 Long term (current) use of topoisomerase inhibitor: Secondary | ICD-10-CM | POA: Diagnosis not present

## 2023-10-21 DIAGNOSIS — Z5111 Encounter for antineoplastic chemotherapy: Secondary | ICD-10-CM | POA: Diagnosis not present

## 2023-10-21 DIAGNOSIS — C3411 Malignant neoplasm of upper lobe, right bronchus or lung: Secondary | ICD-10-CM | POA: Diagnosis not present

## 2023-10-21 DIAGNOSIS — Z79620 Long term (current) use of immunosuppressive biologic: Secondary | ICD-10-CM | POA: Diagnosis not present

## 2023-10-21 DIAGNOSIS — C3412 Malignant neoplasm of upper lobe, left bronchus or lung: Secondary | ICD-10-CM

## 2023-10-21 DIAGNOSIS — Z5189 Encounter for other specified aftercare: Secondary | ICD-10-CM | POA: Diagnosis not present

## 2023-10-21 DIAGNOSIS — Z5112 Encounter for antineoplastic immunotherapy: Secondary | ICD-10-CM | POA: Diagnosis not present

## 2023-10-21 DIAGNOSIS — Z8551 Personal history of malignant neoplasm of bladder: Secondary | ICD-10-CM | POA: Diagnosis not present

## 2023-10-21 DIAGNOSIS — Z923 Personal history of irradiation: Secondary | ICD-10-CM | POA: Diagnosis not present

## 2023-10-21 MED ORDER — PEGFILGRASTIM-FPGK 6 MG/0.6ML ~~LOC~~ SOSY
6.0000 mg | PREFILLED_SYRINGE | Freq: Once | SUBCUTANEOUS | Status: AC
  Administered 2023-10-21: 6 mg via SUBCUTANEOUS
  Filled 2023-10-21: qty 0.6

## 2023-10-24 ENCOUNTER — Encounter: Payer: Self-pay | Admitting: Internal Medicine

## 2023-10-24 ENCOUNTER — Telehealth: Payer: Self-pay

## 2023-10-24 ENCOUNTER — Inpatient Hospital Stay

## 2023-10-24 DIAGNOSIS — Z79634 Long term (current) use of topoisomerase inhibitor: Secondary | ICD-10-CM | POA: Diagnosis not present

## 2023-10-24 DIAGNOSIS — Z5111 Encounter for antineoplastic chemotherapy: Secondary | ICD-10-CM | POA: Diagnosis not present

## 2023-10-24 DIAGNOSIS — Z5189 Encounter for other specified aftercare: Secondary | ICD-10-CM | POA: Diagnosis not present

## 2023-10-24 DIAGNOSIS — Z923 Personal history of irradiation: Secondary | ICD-10-CM | POA: Diagnosis not present

## 2023-10-24 DIAGNOSIS — C3411 Malignant neoplasm of upper lobe, right bronchus or lung: Secondary | ICD-10-CM

## 2023-10-24 DIAGNOSIS — Z95828 Presence of other vascular implants and grafts: Secondary | ICD-10-CM

## 2023-10-24 DIAGNOSIS — Z79620 Long term (current) use of immunosuppressive biologic: Secondary | ICD-10-CM | POA: Diagnosis not present

## 2023-10-24 DIAGNOSIS — Z5112 Encounter for antineoplastic immunotherapy: Secondary | ICD-10-CM | POA: Diagnosis not present

## 2023-10-24 DIAGNOSIS — Z8551 Personal history of malignant neoplasm of bladder: Secondary | ICD-10-CM | POA: Diagnosis not present

## 2023-10-24 LAB — CBC WITH DIFFERENTIAL (CANCER CENTER ONLY)
Abs Immature Granulocytes: 4.34 10*3/uL — ABNORMAL HIGH (ref 0.00–0.07)
Basophils Absolute: 0 10*3/uL (ref 0.0–0.1)
Basophils Relative: 0 %
Eosinophils Absolute: 0.1 10*3/uL (ref 0.0–0.5)
Eosinophils Relative: 0 %
HCT: 26 % — ABNORMAL LOW (ref 39.0–52.0)
Hemoglobin: 8.6 g/dL — ABNORMAL LOW (ref 13.0–17.0)
Immature Granulocytes: 6 %
Lymphocytes Relative: 1 %
Lymphs Abs: 0.5 10*3/uL — ABNORMAL LOW (ref 0.7–4.0)
MCH: 32.7 pg (ref 26.0–34.0)
MCHC: 33.1 g/dL (ref 30.0–36.0)
MCV: 98.9 fL (ref 80.0–100.0)
Monocytes Absolute: 0.6 10*3/uL (ref 0.1–1.0)
Monocytes Relative: 1 %
Neutro Abs: 65.5 10*3/uL — ABNORMAL HIGH (ref 1.7–7.7)
Neutrophils Relative %: 92 %
Platelet Count: 235 10*3/uL (ref 150–400)
RBC: 2.63 MIL/uL — ABNORMAL LOW (ref 4.22–5.81)
RDW: 15 % (ref 11.5–15.5)
WBC Count: 71 10*3/uL (ref 4.0–10.5)
nRBC: 0 % (ref 0.0–0.2)

## 2023-10-24 LAB — CMP (CANCER CENTER ONLY)
ALT: 10 U/L (ref 0–44)
AST: 14 U/L — ABNORMAL LOW (ref 15–41)
Albumin: 3.5 g/dL (ref 3.5–5.0)
Alkaline Phosphatase: 130 U/L — ABNORMAL HIGH (ref 38–126)
Anion gap: 4 — ABNORMAL LOW (ref 5–15)
BUN: 23 mg/dL (ref 8–23)
CO2: 30 mmol/L (ref 22–32)
Calcium: 9.5 mg/dL (ref 8.9–10.3)
Chloride: 107 mmol/L (ref 98–111)
Creatinine: 0.86 mg/dL (ref 0.61–1.24)
GFR, Estimated: >60 mL/min (ref >60)
Glucose, Bld: 132 mg/dL — ABNORMAL HIGH (ref 70–99)
Potassium: 4.3 mmol/L (ref 3.5–5.1)
Sodium: 141 mmol/L (ref 135–145)
Total Bilirubin: 0.3 mg/dL (ref 0.0–1.2)
Total Protein: 6.6 g/dL (ref 6.5–8.1)

## 2023-10-24 MED ORDER — HEPARIN SOD (PORK) LOCK FLUSH 100 UNIT/ML IV SOLN
500.0000 [IU] | Freq: Once | INTRAVENOUS | Status: AC
Start: 2023-10-24 — End: 2023-10-24
  Administered 2023-10-24: 500 [IU]

## 2023-10-24 MED ORDER — SODIUM CHLORIDE 0.9% FLUSH
10.0000 mL | Freq: Once | INTRAVENOUS | Status: AC
Start: 2023-10-24 — End: 2023-10-24
  Administered 2023-10-24: 10 mL

## 2023-10-24 NOTE — Telephone Encounter (Signed)
 Spoke with patient in regards to lab results.  Per Cassie, PA- WBC level is high, likely from stimufend injection received after treatment.  Informed patient to call with any new signs or symptoms of infection. Patient verbalized understanding.

## 2023-10-31 ENCOUNTER — Inpatient Hospital Stay

## 2023-10-31 DIAGNOSIS — Z79634 Long term (current) use of topoisomerase inhibitor: Secondary | ICD-10-CM | POA: Diagnosis not present

## 2023-10-31 DIAGNOSIS — C3412 Malignant neoplasm of upper lobe, left bronchus or lung: Secondary | ICD-10-CM

## 2023-10-31 DIAGNOSIS — Z5111 Encounter for antineoplastic chemotherapy: Secondary | ICD-10-CM | POA: Diagnosis not present

## 2023-10-31 DIAGNOSIS — Z5112 Encounter for antineoplastic immunotherapy: Secondary | ICD-10-CM | POA: Diagnosis not present

## 2023-10-31 DIAGNOSIS — C3411 Malignant neoplasm of upper lobe, right bronchus or lung: Secondary | ICD-10-CM | POA: Diagnosis not present

## 2023-10-31 DIAGNOSIS — Z79620 Long term (current) use of immunosuppressive biologic: Secondary | ICD-10-CM | POA: Diagnosis not present

## 2023-10-31 DIAGNOSIS — Z923 Personal history of irradiation: Secondary | ICD-10-CM | POA: Diagnosis not present

## 2023-10-31 DIAGNOSIS — Z5189 Encounter for other specified aftercare: Secondary | ICD-10-CM | POA: Diagnosis not present

## 2023-10-31 DIAGNOSIS — Z8551 Personal history of malignant neoplasm of bladder: Secondary | ICD-10-CM | POA: Diagnosis not present

## 2023-10-31 LAB — CMP (CANCER CENTER ONLY)
ALT: 16 U/L (ref 0–44)
AST: 17 U/L (ref 15–41)
Albumin: 3.6 g/dL (ref 3.5–5.0)
Alkaline Phosphatase: 104 U/L (ref 38–126)
Anion gap: 6 (ref 5–15)
BUN: 11 mg/dL (ref 8–23)
CO2: 28 mmol/L (ref 22–32)
Calcium: 9.2 mg/dL (ref 8.9–10.3)
Chloride: 106 mmol/L (ref 98–111)
Creatinine: 0.88 mg/dL (ref 0.61–1.24)
GFR, Estimated: >60 mL/min (ref >60)
Glucose, Bld: 151 mg/dL — ABNORMAL HIGH (ref 70–99)
Potassium: 4 mmol/L (ref 3.5–5.1)
Sodium: 140 mmol/L (ref 135–145)
Total Bilirubin: 0.4 mg/dL (ref 0.0–1.2)
Total Protein: 6.6 g/dL (ref 6.5–8.1)

## 2023-10-31 LAB — CBC WITH DIFFERENTIAL (CANCER CENTER ONLY)
Abs Immature Granulocytes: 0.3 10*3/uL — ABNORMAL HIGH (ref 0.00–0.07)
Basophils Absolute: 0.2 10*3/uL — ABNORMAL HIGH (ref 0.0–0.1)
Basophils Relative: 1 %
Eosinophils Absolute: 0.1 10*3/uL (ref 0.0–0.5)
Eosinophils Relative: 0 %
HCT: 29.4 % — ABNORMAL LOW (ref 39.0–52.0)
Hemoglobin: 9.2 g/dL — ABNORMAL LOW (ref 13.0–17.0)
Immature Granulocytes: 2 %
Lymphocytes Relative: 3 %
Lymphs Abs: 0.5 10*3/uL — ABNORMAL LOW (ref 0.7–4.0)
MCH: 32.2 pg (ref 26.0–34.0)
MCHC: 31.3 g/dL (ref 30.0–36.0)
MCV: 102.8 fL — ABNORMAL HIGH (ref 80.0–100.0)
Monocytes Absolute: 1.2 10*3/uL — ABNORMAL HIGH (ref 0.1–1.0)
Monocytes Relative: 7 %
Neutro Abs: 16.6 10*3/uL — ABNORMAL HIGH (ref 1.7–7.7)
Neutrophils Relative %: 87 %
Platelet Count: 124 10*3/uL — ABNORMAL LOW (ref 150–400)
RBC: 2.86 MIL/uL — ABNORMAL LOW (ref 4.22–5.81)
RDW: 17.3 % — ABNORMAL HIGH (ref 11.5–15.5)
Smear Review: NORMAL
WBC Count: 18.9 10*3/uL — ABNORMAL HIGH (ref 4.0–10.5)
nRBC: 0.3 % — ABNORMAL HIGH (ref 0.0–0.2)

## 2023-11-02 ENCOUNTER — Other Ambulatory Visit: Payer: Self-pay

## 2023-11-03 NOTE — Progress Notes (Signed)
 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  Presented with a large left hilar/mediastinal mass, multiple bilateral pulmonary nodules, prevascular lymph node, and subcutaneous nodules consistent with metastatic disease.  He was diagnosed in January 2025. 3) history of prostate cancer in 2014 4) history of urothelial carcinoma in 2017  PRIOR THERAPY: 1) palliative course of radiation to the left lung. The last days expected on 09/16/2023. Under the care of Dr. Jeryl Moris. Completed on 09/19/23   CURRENT THERAPY: 1) Palliative systemic chemotherapy with carboplatin  for an AUC of 5 on day 1 and etoposide  100 mg/m2 on days 1, 2 and 3, and Imfinzi  1500 mg IV every 3 weeks with neulsta support. First dose on 09/06/23. Status post 3 cycles.  2) SBRT to the synchronous stage I NSCLC  INTERVAL HISTORY: John [name] 78 y.o. male returns to the clinic today for a follow-up visit. The patient establish care in the clinic on 08/25/2023. The patient was found to have extensive stage small cell lung cancer in addition to synchronous early-stage non-small cell lung cancer.  He is currently undergoing palliative radiation to the left lung under the care of Dr. Jeryl Moris and SBRT to the synchronous stage I non-small cell lung cancer.  He is status post 3 cycles of chemotherapy and immunotherapy and tolerates it well.   Overall, he states he is feeling fine today.  He denies any fever or chills.  He reports he intermittently struggles with night sweats on and off and has been doing so for the last 5 to 6 years which is unchanged.  Over the last 6 months, he has lost about 40 pounds unintentionally. His appetite has been "very good". He gained a few pounds. He denies shortness of breath. He denies any  significant cough unless related to allergies. He denies any hemoptysis or chest pain.  He denies any nausea, vomiting, diarrhea, or significant constipation. He takes miralax if needed for constipation. He denies any headache or visual changes.  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  MEDICATIONS:  Current Outpatient Medications  Medication Sig Dispense Refill   albuterol  (VENTOLIN  HFA) 108 (90 Base) MCG/ACT inhaler Inhale 1-2 puffs into the lungs every 6 (six) hours as needed for wheezing or shortness of breath. 8 g 2   amLODipine (NORVASC) 10 MG tablet Take 10 mg by mouth daily.  COMIRNATY syringe      FLUZONE HIGH-DOSE 0.5 ML injection      lidocaine -prilocaine  (EMLA ) cream Apply 1 Application topically as needed. 30 g 2   prochlorperazine  (COMPAZINE ) 10 MG tablet Take 1 tablet (10 mg total) by mouth every 6 (six) hours as needed. 30 tablet 2   No current facility-administered medications for this visit.    SURGICAL HISTORY:  Past Surgical History:  Procedure Laterality Date   ABDOMINAL HERNIA REPAIR  1990's   BRONCHIAL BIOPSY  08/08/2023    Procedure: BRONCHIAL BIOPSIES;  Surgeon: Denson Flake, MD;  Location: [facility] ENDOSCOPY;  Service: Pulmonary;;   BRONCHIAL BRUSHINGS  08/08/2023   Procedure: BRONCHIAL BRUSHINGS;  Surgeon: Denson Flake, MD;  Location: [facility] ENDOSCOPY;  Service: Pulmonary;;   BRONCHIAL NEEDLE ASPIRATION BIOPSY  08/08/2023   Procedure: BRONCHIAL NEEDLE ASPIRATION BIOPSIES;  Surgeon: Denson Flake, MD;  Location: [facility] ENDOSCOPY;  Service: Pulmonary;;   CYSTOSCOPY W/ RETROGRADES N/A 11/03/2015   Procedure: CYSTOSCOPY WITH RETROGRADE PYELOGRAM;  Surgeon: Marco Severs, MD;  Location: [facility];  Service: Urology;  Laterality: N/A;   CYSTOSCOPY W/ RETROGRADES Left 01/09/2016   Procedure: CYSTOSCOPY WITH LEFT RETROGRADE PYELOGRAM;  Surgeon: Marco Severs, MD;  Location: [facility];  Service: Urology;  Laterality: Left;   CYSTOSCOPY W/ RETROGRADES Bilateral 05/28/2016   Procedure: CYSTOSCOPY WITH RETROGRADE PYELOGRAM;  Surgeon: Marco Severs, MD;  Location: [facility];  Service: Urology;  Laterality: Bilateral;   CYSTOSCOPY W/ RETROGRADES Bilateral 02/17/2021   Procedure: CYSTOSCOPY WITH RETROGRADE PYELOGRAM;  Surgeon: Sherlyn Ditto, MD;  Location: [facility];  Service: Urology;  Laterality: Bilateral;   CYSTOSCOPY W/ URETERAL STENT REMOVAL Left 01/09/2016   Procedure: CYSTOSCOPY WITH LEFT STENT EXCHANGE;  Surgeon: Marco Severs, MD;  Location: [facility];  Service: Urology;  Laterality: Left;   CYSTOSCOPY WITH BIOPSY N/A 05/28/2016   Procedure: CYSTOSCOPY WITH BIOPSY;  Surgeon: Marco Severs, MD;  Location: [facility];  Service: Urology;  Laterality: N/A;   CYSTOSCOPY WITH URETHRAL DILATATION N/A 05/28/2016   Procedure: CYSTOSCOPY WITH URETHRAL DILATATION;  Surgeon: Marco Severs, MD;  Location: [facility];  Service: Urology;  Laterality: N/A;   HEMOSTASIS CONTROL  08/08/2023    Procedure: HEMOSTASIS CONTROL;  Surgeon: Denson Flake, MD;  Location: [facility] ENDOSCOPY;  Service: Pulmonary;;   IR IMAGING GUIDED PORT INSERTION  09/19/2023   PROSTATE BIOPSY  05/08/12   Adenocarcinoma   RADIOACTIVE SEED IMPLANT  08/01/2012   Procedure: RADIOACTIVE SEED IMPLANT;  Surgeon: Jinny Mounts, MD;  Location: [facility];  Service: Urology;  Laterality: N/A;  73 seeds implanted no seeds found in bladder   TRANSURETHRAL RESECTION OF BLADDER TUMOR N/A 11/03/2015   Procedure: TRANSURETHRAL RESECTION OF BLADDER TUMOR (TURBT)WITH INSERTION STENT;  Surgeon: Marco Severs, MD;  Location: [facility];  Service: Urology;  Laterality: N/A;   TRANSURETHRAL RESECTION OF BLADDER TUMOR N/A 02/17/2021   Procedure: TRANSURETHRAL RESECTION OF BLADDER TUMOR (TURBT);  Surgeon: Sherlyn Ditto, MD;  Location: [facility];  Service: Urology;  Laterality: N/A;  30 MINS   TRANSURETHRAL RESECTION OF BLADDER TUMOR WITH GYRUS (TURBT-GYRUS) N/A 01/09/2016   Procedure: TRANSURETHRAL RESECTION OF BLADDER TUMOR WITH GYRUS (TURBT-GYRUS);  Surgeon: Marco Severs, MD;  Location: [facility];  Service: Urology;  Laterality: N/A;   VIDEO BRONCHOSCOPY WITH RADIAL ENDOBRONCHIAL ULTRASOUND  08/08/2023   Procedure:  VIDEO BRONCHOSCOPY WITH RADIAL ENDOBRONCHIAL ULTRASOUND;  Surgeon: Denson Flake, MD;  Location: [facility] ENDOSCOPY;  Service: Pulmonary;;    REVIEW OF SYSTEMS:   Review of Systems  Constitutional: Negative for appetite change, chills, fatigue, fever and unexpected weight change.  HENT: Negative for mouth sores, nosebleeds, sore throat and trouble swallowing.   Eyes: Negative for eye problems and icterus.  Respiratory: Negative for cough, hemoptysis, shortness of breath and wheezing.   Cardiovascular: Negative for chest pain and leg swelling.  Gastrointestinal: Negative for abdominal pain, constipation, diarrhea, nausea and vomiting.  Genitourinary:  Negative for bladder incontinence, difficulty urinating, dysuria, frequency and hematuria.   Musculoskeletal: Negative for back pain, gait problem, neck pain and neck stiffness.  Skin: Negative for itching and rash.  Neurological: Negative for dizziness, extremity weakness, gait problem, headaches, light-headedness and seizures.  Hematological: Negative for adenopathy. Does not bruise/bleed easily.  Psychiatric/Behavioral: Negative for confusion, depression and sleep disturbance. The patient is not nervous/anxious.     PHYSICAL EXAMINATION:  Blood pressure (!) 130/55, pulse 90, temperature 97.6 F (36.4 C), temperature source Temporal, resp. rate 18, weight 157 lb 6.4 oz (71.4 kg).  ECOG PERFORMANCE STATUS: 1  Physical Exam  Constitutional: Oriented to person, place, and time and thin appearing male and in no distress.  HENT:  Head: Normocephalic and atraumatic.  Mouth/Throat: Oropharynx is clear and moist. No oropharyngeal exudate.  Eyes: Conjunctivae are normal. Right eye exhibits no discharge. Left eye exhibits no discharge. No scleral icterus.  Neck: Normal range of motion. Neck supple.  Cardiovascular: Normal rate, regular rhythm, normal heart sounds and intact distal pulses.   Pulmonary/Chest: Effort normal.  Quiet breath sounds in the left lung.  No respiratory distress. No wheezes. No rales.   Abdominal: Soft. Bowel sounds are normal. Exhibits no distension and no mass. There is no tenderness.  Musculoskeletal: Normal range of motion. Exhibits no edema.  Lymphadenopathy:    No cervical adenopathy.  Neurological: Alert and oriented to person, place, and time. Exhibits normal muscle tone. Gait normal. Coordination normal.  Skin: Positive for scattered subcutaneous nodules.  Skin is warm and dry. No rash noted. Not diaphoretic. No erythema. No pallor.  Psychiatric: Mood, memory and judgment normal.  Vitals reviewed.  LABORATORY DATA: Lab Results  Component Value Date   WBC  18.2 (H) 11/08/2023   HGB 9.9 (L) 11/08/2023   HCT 30.2 (L) 11/08/2023   MCV 99.0 11/08/2023   PLT 195 11/08/2023      Chemistry      Component Value Date/Time   NA 140 10/31/2023 1203   K 4.0 10/31/2023 1203   CL 106 10/31/2023 1203   CO2 28 10/31/2023 1203   BUN 11 10/31/2023 1203   CREATININE 0.88 10/31/2023 1203      Component Value Date/Time   CALCIUM 9.2 10/31/2023 1203   ALKPHOS 104 10/31/2023 1203   AST 17 10/31/2023 1203   ALT 16 10/31/2023 1203   BILITOT 0.4 10/31/2023 1203       RADIOGRAPHIC STUDIES:  CT CHEST ABDOMEN PELVIS W CONTRAST Result Date: 10/14/2023 CLINICAL DATA:  Assess treatment response small-cell carcinoma of hilum of the left lung. Also history of bladder and prostate cancer. * Tracking Code: BO * EXAM: CT CHEST, ABDOMEN, AND PELVIS WITH CONTRAST TECHNIQUE: Multidetector CT imaging of the chest, abdomen and pelvis was performed following the standard protocol during bolus administration of intravenous contrast. RADIATION DOSE REDUCTION: This exam was performed according to the departmental dose-optimization program which includes automated exposure  control, adjustment of the mA and/or kV according to patient size and/or use of iterative reconstruction technique. CONTRAST:  OMNIPAQUE  IOHEXOL  300 MG/ML  SOLN COMPARISON:  PET-CT scan 08/19/2023.  Noncontrast CT 08/03/2023. FINDINGS: CT CHEST FINDINGS Cardiovascular: Right IJ chest port is accessed. Tip of the catheter extends to the upper right atrium. Heart is nonenlarged. Trace pericardial fluid. Coronary artery calcifications are seen. The thoracic aorta has normal course and caliber with mild atherosclerotic calcified plaque. There is a bovine type aortic arch, normal variant. Somewhat small right brachiocephalic vein, unchanged. Mediastinum/Nodes: Slightly patulous thoracic esophagus. Slightly small thyroid  gland. No discrete abnormal lymph node enlargement identified in the axillary regions. Prior  exams demonstrated a large confluent mass centered along the AP window extension along the hilum and deeper into the mediastinum. On the prior this measured 9.4 x 6.7 cm and today when measured on series 2, image 25 measures 4.2 by 6.2 cm. In addition there is some hypermetabolic nodes identified in the more superior aspect of the left-sided mediastinum including an node seen anterior to the left subclavian artery which previously measured 10 mm and today is barely visible with a small area of tissue on image 20 of series 2 measuring 6 mm. Previously there is also a nodular focus medially adjacent to the descending thoracic aorta which is hypermetabolic. AP dimension of this lesion at that time would have measured 2.1 cm. Today there is only minimal thickening in this location on series 2, image 32. No new nodal enlargement along the right hilum. Some small nodes are seen in this location, less than a cm in short axis. No new mediastinal nodal enlargement. Lungs/Pleura: Scattered peripheral areas of scarring and fibrotic changes along the lungs with interstitial septal thickening. Paraseptal and centrilobular emphysematous changes are also identified. No pneumothorax or effusion. No new areas of consolidation. The spiculated right upper lobe lung nodules again seen. Today on series 4, image 57 this lesion which abuts the major fissure measures 2.3 by 2.1 cm. Previously when measured in same fashion is today the lesion would have measured 2.9 by 2.2 cm. Again this was hypermetabolic. No new aggressive liver lesions are identified at this time. In addition there was a focus of abnormal uptake and nodular tissue immediately posterior to the anterior margin left first rib with abnormal uptake. Minimal thickening in this location remains measured at a thickness of 3 mm on series 4, image 27. Musculoskeletal: Scattered degenerative changes along the spine. On the prior there were hypermetabolic soft tissue nodules  identified along the thorax. Example along the right anterior hemithorax immediately anterior to the right third rib is a focus today on series 2, image 30 measuring 16 by 10 mm. Previously transverse dimension of 15 mm, similar. Additional focus superficial to the right scapular musculature. Today small area of nodular tissue in this location on series 2, image 24 measures 12 by 4 mm. Previously larger at 14 by 7 mm. No definite new soft tissue nodule identified along the thorax. CT ABDOMEN PELVIS FINDINGS Hepatobiliary: No focal liver abnormality is seen. No gallstones, gallbladder wall thickening, or biliary dilatation. Patent portal vein Pancreas: Unremarkable. No pancreatic ductal dilatation or surrounding inflammatory changes. Spleen: Normal in size without focal abnormality. Adrenals/Urinary Tract: Adrenal glands are preserved. Moderate atrophy of the left kidney with lower pole parapelvic cysts and some parenchymal CIS and presumed calyceal diverticula. No enhancing renal mass. Small right-sided benign parenchymal cyst as well. The ureters have a normal course and caliber extending  down to the urinary bladder. Preserved contour to the urinary bladder. Stomach/Bowel: On this non oral contrast exam, large bowel has a normal course and caliber with significant colonic stool. Normal appendix seen retrocecal in the right hemipelvis. Stomach and small bowel have a normal course and caliber. There are several fluid-filled loops of nondilated small bowel in the left mid abdomen and pelvis. Vascular/Lymphatic: Aortic atherosclerosis. No enlarged abdominal or pelvic lymph nodes. Reproductive: Brachytherapy changes along the prostate. Other: No free air or free fluid. Musculoskeletal: Mild degenerative changes along the spine. Scattered degenerative changes well along the hip joints, right-greater-than-left. Curvature of the spine. Areas of disc bulging and encroachment. Small area of soft tissue nodularity along the  anterior abdominal wall on the left side on series 2, image 60 and image 75 are unchanged from previous and were not hypermetabolic. IMPRESSION: Overall significant interval improvement. Marked decrease in size of the confluent mass along left side of the mediastinum. There is also decrease in size of mediastinal lymph nodes and areas of pleural masses along left hemithorax including adjacent to the descending thoracic aorta and posterior to the left first rib. Right upper lobe spiculated lung mass is slightly smaller today. Soft tissue hypermetabolic lesions along the right hemithorax are again seen. The lateral lesion is smaller. The anterior lesion is similar. No new nodal enlargement, mass lesion. Atrophy of the left kidney with areas of calyceal diverticula. Brachytherapy changes of the prostate. Emphysematous lung changes. Electronically Signed   By: Adrianna Horde M.D.   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  Presented with a large left hilar/mediastinal mass, multiple bilateral pulmonary nodules, prevascular lymph node, and subcutaneous nodules consistent with metastatic disease.  He was diagnosed in January 2025. 3) history of prostate cancer in 2014 4) history of urothelial carcinoma in 2017   The patient is going to start a 3-week palliative course of radiation to the left lung.  The last days on 09/19/2023.  This will be given under the care of Dr. Jeryl Moris.    Given the small cell lung cancer in the left lung, Dr. Marguerita Shih recommended chemotherapy with carboplatin  for an AUC of 5 on day 1 and etoposide  100 mg/m2 on days 1, 2 and 3, and Imfinzi  1500 mg IV every 3 weeks with neulsta support. He started this on 09/06/23. He is status post 3 cycle and tolerated it well without any appreciable  adverse side effects.   Labs were reviewed. His CMP is pending. As long as that is in parameters, he will not need. Recommend he proceed with cycle #4 today as scheduled.   I will arrange for another restaging CT scan of the chest, abdomen, and pelvis prior to his next cycle of treatment.   We will see him back in 3 weeks for evaluation and repeat blood work before undergoing cycle #5   He will undergo SBRT to the synchronous stage I NSCLC.   I have placed standing orders for sample to blood bank should he need blood transfusion in the interval.   His WBC was significantly elevated likely due to the neulasta  injection. He denies signs or symptoms of infection. I reviewed his WBC with Dr. Marguerita Shih., he would continue to neulasta   The patient was advised to call immediately if he has any  concerning symptoms in the interval. The patient voices understanding of current disease status and treatment options and is in agreement with the current care plan. All questions were answered. The patient knows to call the clinic with any problems, questions or concerns. We can certainly see the patient much sooner if necessary  Orders Placed This Encounter  Procedures   CT CHEST ABDOMEN PELVIS W CONTRAST    Standing Status:   Future    Expected Date:   11/24/2023    Expiration Date:   11/07/2024    If indicated for the ordered procedure, I authorize the administration of contrast media per Radiology protocol:   Yes    Does the patient have a contrast media/X-ray dye allergy?:   No    Preferred imaging location?:   [facility]    If indicated for the ordered procedure, I authorize the administration of oral contrast media per Radiology protocol:   Yes   Sample to Blood Bank    Standing Status:   Standing    Number of Occurrences:   5    Next Expected Occurrence:   11/11/2023    Expiration Date:   11/07/2024     The total time spent in the appointment was 20-29 minutes  Annasophia Crocker L  Reinhart Saulters, PA-C 11/08/23

## 2023-11-07 MED FILL — Fosaprepitant Dimeglumine For IV Infusion 150 MG (Base Eq): INTRAVENOUS | Qty: 5 | Status: AC

## 2023-11-08 ENCOUNTER — Inpatient Hospital Stay: Payer: 59

## 2023-11-08 ENCOUNTER — Telehealth: Payer: Self-pay | Admitting: Radiation Oncology

## 2023-11-08 ENCOUNTER — Inpatient Hospital Stay (HOSPITAL_BASED_OUTPATIENT_CLINIC_OR_DEPARTMENT_OTHER): Payer: 59 | Admitting: Physician Assistant

## 2023-11-08 VITALS — BP 130/55 | HR 90 | Temp 97.6°F | Resp 18 | Wt 157.4 lb

## 2023-11-08 DIAGNOSIS — Z5112 Encounter for antineoplastic immunotherapy: Secondary | ICD-10-CM | POA: Diagnosis not present

## 2023-11-08 DIAGNOSIS — C3402 Malignant neoplasm of left main bronchus: Secondary | ICD-10-CM | POA: Diagnosis not present

## 2023-11-08 DIAGNOSIS — C3412 Malignant neoplasm of upper lobe, left bronchus or lung: Secondary | ICD-10-CM

## 2023-11-08 DIAGNOSIS — Z95828 Presence of other vascular implants and grafts: Secondary | ICD-10-CM

## 2023-11-08 DIAGNOSIS — C3411 Malignant neoplasm of upper lobe, right bronchus or lung: Secondary | ICD-10-CM | POA: Diagnosis not present

## 2023-11-08 DIAGNOSIS — Z79620 Long term (current) use of immunosuppressive biologic: Secondary | ICD-10-CM | POA: Diagnosis not present

## 2023-11-08 DIAGNOSIS — Z923 Personal history of irradiation: Secondary | ICD-10-CM | POA: Diagnosis not present

## 2023-11-08 DIAGNOSIS — Z8551 Personal history of malignant neoplasm of bladder: Secondary | ICD-10-CM | POA: Diagnosis not present

## 2023-11-08 DIAGNOSIS — Z5111 Encounter for antineoplastic chemotherapy: Secondary | ICD-10-CM

## 2023-11-08 DIAGNOSIS — Z79634 Long term (current) use of topoisomerase inhibitor: Secondary | ICD-10-CM | POA: Diagnosis not present

## 2023-11-08 DIAGNOSIS — Z5189 Encounter for other specified aftercare: Secondary | ICD-10-CM | POA: Diagnosis not present

## 2023-11-08 LAB — CBC WITH DIFFERENTIAL (CANCER CENTER ONLY)
Abs Immature Granulocytes: 0.17 10*3/uL — ABNORMAL HIGH (ref 0.00–0.07)
Basophils Absolute: 0 10*3/uL (ref 0.0–0.1)
Basophils Relative: 0 %
Eosinophils Absolute: 0.1 10*3/uL (ref 0.0–0.5)
Eosinophils Relative: 1 %
HCT: 30.2 % — ABNORMAL LOW (ref 39.0–52.0)
Hemoglobin: 9.9 g/dL — ABNORMAL LOW (ref 13.0–17.0)
Immature Granulocytes: 1 %
Lymphocytes Relative: 3 %
Lymphs Abs: 0.5 10*3/uL — ABNORMAL LOW (ref 0.7–4.0)
MCH: 32.5 pg (ref 26.0–34.0)
MCHC: 32.8 g/dL (ref 30.0–36.0)
MCV: 99 fL (ref 80.0–100.0)
Monocytes Absolute: 1 10*3/uL (ref 0.1–1.0)
Monocytes Relative: 6 %
Neutro Abs: 16.3 10*3/uL — ABNORMAL HIGH (ref 1.7–7.7)
Neutrophils Relative %: 89 %
Platelet Count: 195 10*3/uL (ref 150–400)
RBC: 3.05 MIL/uL — ABNORMAL LOW (ref 4.22–5.81)
RDW: 15.9 % — ABNORMAL HIGH (ref 11.5–15.5)
WBC Count: 18.2 10*3/uL — ABNORMAL HIGH (ref 4.0–10.5)
nRBC: 0.1 % (ref 0.0–0.2)

## 2023-11-08 LAB — CMP (CANCER CENTER ONLY)
ALT: 12 U/L (ref 0–44)
AST: 15 U/L (ref 15–41)
Albumin: 3.4 g/dL — ABNORMAL LOW (ref 3.5–5.0)
Alkaline Phosphatase: 79 U/L (ref 38–126)
Anion gap: 5 (ref 5–15)
BUN: 14 mg/dL (ref 8–23)
CO2: 30 mmol/L (ref 22–32)
Calcium: 9.3 mg/dL (ref 8.9–10.3)
Chloride: 107 mmol/L (ref 98–111)
Creatinine: 0.92 mg/dL (ref 0.61–1.24)
GFR, Estimated: >60 mL/min (ref >60)
Glucose, Bld: 133 mg/dL — ABNORMAL HIGH (ref 70–99)
Potassium: 4.1 mmol/L (ref 3.5–5.1)
Sodium: 142 mmol/L (ref 135–145)
Total Bilirubin: 0.3 mg/dL (ref 0.0–1.2)
Total Protein: 6.8 g/dL (ref 6.5–8.1)

## 2023-11-08 MED ORDER — DEXAMETHASONE SODIUM PHOSPHATE 10 MG/ML IJ SOLN
10.0000 mg | Freq: Once | INTRAMUSCULAR | Status: AC
  Administered 2023-11-08: 10 mg via INTRAVENOUS
  Filled 2023-11-08: qty 1

## 2023-11-08 MED ORDER — DURVALUMAB 500 MG/10ML IV SOLN
1500.0000 mg | Freq: Once | INTRAVENOUS | Status: AC
  Administered 2023-11-08: 1500 mg via INTRAVENOUS
  Filled 2023-11-08: qty 30

## 2023-11-08 MED ORDER — SODIUM CHLORIDE 0.9% FLUSH
10.0000 mL | INTRAVENOUS | Status: DC | PRN
  Administered 2023-11-08: 10 mL

## 2023-11-08 MED ORDER — SODIUM CHLORIDE 0.9 % IV SOLN: INTRAVENOUS | Status: DC

## 2023-11-08 MED ORDER — SODIUM CHLORIDE 0.9 % IV SOLN
150.0000 mg | Freq: Once | INTRAVENOUS | Status: AC
  Administered 2023-11-08: 150 mg via INTRAVENOUS
  Filled 2023-11-08: qty 150

## 2023-11-08 MED ORDER — HEPARIN SOD (PORK) LOCK FLUSH 100 UNIT/ML IV SOLN
500.0000 [IU] | Freq: Once | INTRAVENOUS | Status: AC | PRN
  Administered 2023-11-08: 500 [IU]

## 2023-11-08 MED ORDER — PALONOSETRON HCL INJECTION 0.25 MG/5ML
0.2500 mg | Freq: Once | INTRAVENOUS | Status: AC
  Administered 2023-11-08: 0.25 mg via INTRAVENOUS
  Filled 2023-11-08: qty 5

## 2023-11-08 MED ORDER — SODIUM CHLORIDE 0.9 % IV SOLN
100.0000 mg/m2 | Freq: Once | INTRAVENOUS | Status: AC
  Administered 2023-11-08: 200 mg via INTRAVENOUS
  Filled 2023-11-08: qty 10

## 2023-11-08 MED ORDER — SODIUM CHLORIDE 0.9% FLUSH
10.0000 mL | Freq: Once | INTRAVENOUS | Status: AC
Start: 2023-11-08 — End: 2023-11-08
  Administered 2023-11-08: 10 mL

## 2023-11-08 MED ORDER — SODIUM CHLORIDE 0.9 % IV SOLN
387.0000 mg | Freq: Once | INTRAVENOUS | Status: AC
  Administered 2023-11-08: 390 mg via INTRAVENOUS
  Filled 2023-11-08: qty 39

## 2023-11-08 NOTE — Telephone Encounter (Signed)
 I spoke with the patient to let him know Dr. Jeryl Moris has seen his recent CT scan and would now offer SBRT to the RUL nodule like we have discussed previously since he finishes chemotherapy this week. We discussed simulation on a day he will already be here for labs. We reviewed risks and benefits of treatment as well as side effects and will sign written consent on the day of his simulation.

## 2023-11-08 NOTE — Patient Instructions (Signed)
 [facility] - A DEPT OF [facility]  Discharge Instructions: Thank you for choosing [facility] to provide your oncology and hematology care.   If you have a lab appointment with the Cancer Center, please go directly to the Cancer Center and check in at the registration area.   Wear comfortable clothing and clothing appropriate for easy access to any Portacath or PICC line.   We strive to give you quality time with your provider. You may need to reschedule your appointment if you arrive late (15 or more minutes).  Arriving late affects you and other patients whose appointments are after yours.  Also, if you miss three or more appointments without notifying the office, you may be dismissed from the clinic at the provider's discretion.      For prescription refill requests, have your pharmacy contact our office and allow 72 hours for refills to be completed.    Today you received the following chemotherapy and/or immunotherapy agents imfinzi , carboplatin , vepesid       To help prevent nausea and vomiting after your treatment, we encourage you to take your nausea medication as directed.  BELOW ARE SYMPTOMS THAT SHOULD BE REPORTED IMMEDIATELY: *FEVER GREATER THAN 100.4 F (38 C) OR HIGHER *CHILLS OR SWEATING *NAUSEA AND VOMITING THAT IS NOT CONTROLLED WITH YOUR NAUSEA MEDICATION *UNUSUAL SHORTNESS OF BREATH *UNUSUAL BRUISING OR BLEEDING *URINARY PROBLEMS (pain or burning when urinating, or frequent urination) *BOWEL PROBLEMS (unusual diarrhea, constipation, pain near the anus) TENDERNESS IN MOUTH AND THROAT WITH OR WITHOUT PRESENCE OF ULCERS (sore throat, sores in mouth, or a toothache) UNUSUAL RASH, SWELLING OR PAIN  UNUSUAL VAGINAL DISCHARGE OR ITCHING   Items with * indicate a potential emergency and should be followed up as soon as possible or go to the Emergency Department if any problems should occur.  Please show the CHEMOTHERAPY ALERT  CARD or IMMUNOTHERAPY ALERT CARD at check-in to the Emergency Department and triage nurse.  Should you have questions after your visit or need to cancel or reschedule your appointment, please contact [facility] - A DEPT OF [facility]  Dept: [phone]  and follow the prompts.  Office hours are 8:00 a.m. to 4:30 p.m. Monday - Friday. Please note that voicemails left after 4:00 p.m. may not be returned until the following business day.  We are closed weekends and major holidays. You have access to a nurse at all times for urgent questions. Please call the main number to the clinic Dept: [phone] and follow the prompts.   For any non-urgent questions, you may also contact your provider using MyChart. We now offer e-Visits for anyone 53 and older to request care online for non-urgent symptoms. For details visit [email or web address].   Also download the MyChart app! Go to the app store, search "MyChart", open the app, select [place], and log in with your MyChart username and password.

## 2023-11-09 ENCOUNTER — Ambulatory Visit: Payer: 59

## 2023-11-09 ENCOUNTER — Inpatient Hospital Stay: Payer: 59

## 2023-11-09 VITALS — BP 122/51 | HR 88 | Temp 98.1°F | Resp 18

## 2023-11-09 DIAGNOSIS — C3412 Malignant neoplasm of upper lobe, left bronchus or lung: Secondary | ICD-10-CM

## 2023-11-09 DIAGNOSIS — C3411 Malignant neoplasm of upper lobe, right bronchus or lung: Secondary | ICD-10-CM | POA: Diagnosis not present

## 2023-11-09 DIAGNOSIS — Z79620 Long term (current) use of immunosuppressive biologic: Secondary | ICD-10-CM | POA: Diagnosis not present

## 2023-11-09 DIAGNOSIS — Z5112 Encounter for antineoplastic immunotherapy: Secondary | ICD-10-CM | POA: Diagnosis not present

## 2023-11-09 DIAGNOSIS — Z5111 Encounter for antineoplastic chemotherapy: Secondary | ICD-10-CM | POA: Diagnosis not present

## 2023-11-09 DIAGNOSIS — Z8551 Personal history of malignant neoplasm of bladder: Secondary | ICD-10-CM | POA: Diagnosis not present

## 2023-11-09 DIAGNOSIS — Z5189 Encounter for other specified aftercare: Secondary | ICD-10-CM | POA: Diagnosis not present

## 2023-11-09 DIAGNOSIS — Z923 Personal history of irradiation: Secondary | ICD-10-CM | POA: Diagnosis not present

## 2023-11-09 DIAGNOSIS — Z79634 Long term (current) use of topoisomerase inhibitor: Secondary | ICD-10-CM | POA: Diagnosis not present

## 2023-11-09 MED ORDER — SODIUM CHLORIDE 0.9% FLUSH
10.0000 mL | INTRAVENOUS | Status: DC | PRN
  Administered 2023-11-09: 10 mL

## 2023-11-09 MED ORDER — SODIUM CHLORIDE 0.9 % IV SOLN
100.0000 mg/m2 | Freq: Once | INTRAVENOUS | Status: AC
  Administered 2023-11-09: 200 mg via INTRAVENOUS
  Filled 2023-11-09: qty 10

## 2023-11-09 MED ORDER — DEXAMETHASONE SODIUM PHOSPHATE 10 MG/ML IJ SOLN
10.0000 mg | Freq: Once | INTRAMUSCULAR | Status: AC
  Administered 2023-11-09: 10 mg via INTRAVENOUS
  Filled 2023-11-09: qty 1

## 2023-11-09 MED ORDER — HEPARIN SOD (PORK) LOCK FLUSH 100 UNIT/ML IV SOLN
500.0000 [IU] | Freq: Once | INTRAVENOUS | Status: AC | PRN
  Administered 2023-11-09: 500 [IU]

## 2023-11-09 MED ORDER — SODIUM CHLORIDE 0.9 % IV SOLN: INTRAVENOUS | Status: DC

## 2023-11-09 NOTE — Patient Instructions (Signed)
 [facility]  Discharge Instructions: Thank you for choosing [facility] to provide your oncology and hematology care.   If you have a lab appointment with the Cancer Center, please go directly to the Cancer Center and check in at the registration area.   Wear comfortable clothing and clothing appropriate for easy access to any Portacath or PICC line.   We strive to give you quality time with your provider. You may need to reschedule your appointment if you arrive late (15 or more minutes).  Arriving late affects you and other patients whose appointments are after yours.  Also, if you miss three or more appointments without notifying the office, you may be dismissed from the clinic at the provider's discretion.      For prescription refill requests, have your pharmacy contact our office and allow 72 hours for refills to be completed.    Today you received the following chemotherapy and/or immunotherapy agents: Etoposide      To help prevent nausea and vomiting after your treatment, we encourage you to take your nausea medication as directed.  BELOW ARE SYMPTOMS THAT SHOULD BE REPORTED IMMEDIATELY: *FEVER GREATER THAN 100.4 F (38 C) OR HIGHER *CHILLS OR SWEATING *NAUSEA AND VOMITING THAT IS NOT CONTROLLED WITH YOUR NAUSEA MEDICATION *UNUSUAL SHORTNESS OF BREATH *UNUSUAL BRUISING OR BLEEDING *URINARY PROBLEMS (pain or burning when urinating, or frequent urination) *BOWEL PROBLEMS (unusual diarrhea, constipation, pain near the anus) TENDERNESS IN MOUTH AND THROAT WITH OR WITHOUT PRESENCE OF ULCERS (sore throat, sores in mouth, or a toothache) UNUSUAL RASH, SWELLING OR PAIN  UNUSUAL VAGINAL DISCHARGE OR ITCHING   Items with * indicate a potential emergency and should be followed up as soon as possible or go to the Emergency Department if any problems should occur.  Please show the CHEMOTHERAPY ALERT CARD or IMMUNOTHERAPY  ALERT CARD at check-in to the Emergency Department and triage nurse.  Should you have questions after your visit or need to cancel or reschedule your appointment, please contact [facility]  Dept: [phone]  and follow the prompts.  Office hours are 8:00 a.m. to 4:30 p.m. Monday - Friday. Please note that voicemails left after 4:00 p.m. may not be returned until the following business day.  We are closed weekends and major holidays. You have access to a nurse at all times for urgent questions. Please call the main number to the clinic Dept: [phone] and follow the prompts.   For any non-urgent questions, you may also contact your provider using MyChart. We now offer e-Visits for anyone 10 and older to request care online for non-urgent symptoms. For details visit [email or web address].   Also download the MyChart app! Go to the app store, search "MyChart", open the app, select [place], and log in with your MyChart username and password.

## 2023-11-10 ENCOUNTER — Inpatient Hospital Stay: Payer: 59 | Attending: Internal Medicine

## 2023-11-10 VITALS — BP 114/54 | HR 79 | Temp 98.3°F | Resp 16

## 2023-11-10 DIAGNOSIS — C3411 Malignant neoplasm of upper lobe, right bronchus or lung: Secondary | ICD-10-CM | POA: Diagnosis not present

## 2023-11-10 DIAGNOSIS — Z8546 Personal history of malignant neoplasm of prostate: Secondary | ICD-10-CM | POA: Diagnosis not present

## 2023-11-10 DIAGNOSIS — Z5112 Encounter for antineoplastic immunotherapy: Secondary | ICD-10-CM | POA: Diagnosis not present

## 2023-11-10 DIAGNOSIS — Z5189 Encounter for other specified aftercare: Secondary | ICD-10-CM | POA: Insufficient documentation

## 2023-11-10 DIAGNOSIS — C3412 Malignant neoplasm of upper lobe, left bronchus or lung: Secondary | ICD-10-CM | POA: Insufficient documentation

## 2023-11-10 DIAGNOSIS — Z79620 Long term (current) use of immunosuppressive biologic: Secondary | ICD-10-CM | POA: Insufficient documentation

## 2023-11-10 DIAGNOSIS — Z87891 Personal history of nicotine dependence: Secondary | ICD-10-CM | POA: Insufficient documentation

## 2023-11-10 DIAGNOSIS — Z5111 Encounter for antineoplastic chemotherapy: Secondary | ICD-10-CM | POA: Insufficient documentation

## 2023-11-10 DIAGNOSIS — Z923 Personal history of irradiation: Secondary | ICD-10-CM | POA: Insufficient documentation

## 2023-11-10 DIAGNOSIS — Z79634 Long term (current) use of topoisomerase inhibitor: Secondary | ICD-10-CM | POA: Diagnosis not present

## 2023-11-10 DIAGNOSIS — C61 Malignant neoplasm of prostate: Secondary | ICD-10-CM | POA: Insufficient documentation

## 2023-11-10 DIAGNOSIS — Z8551 Personal history of malignant neoplasm of bladder: Secondary | ICD-10-CM | POA: Diagnosis not present

## 2023-11-10 DIAGNOSIS — Z79630 Long term (current) use of alkylating agent: Secondary | ICD-10-CM | POA: Insufficient documentation

## 2023-11-10 DIAGNOSIS — Z510 Encounter for antineoplastic radiation therapy: Secondary | ICD-10-CM | POA: Insufficient documentation

## 2023-11-10 MED ORDER — DEXAMETHASONE SODIUM PHOSPHATE 10 MG/ML IJ SOLN
10.0000 mg | Freq: Once | INTRAMUSCULAR | Status: AC
  Administered 2023-11-10: 10 mg via INTRAVENOUS
  Filled 2023-11-10: qty 1

## 2023-11-10 MED ORDER — SODIUM CHLORIDE 0.9 % IV SOLN: INTRAVENOUS | Status: DC

## 2023-11-10 MED ORDER — SODIUM CHLORIDE 0.9% FLUSH
10.0000 mL | INTRAVENOUS | Status: DC | PRN
Start: 2023-11-10 — End: 2023-11-10
  Administered 2023-11-10: 10 mL

## 2023-11-10 MED ORDER — HEPARIN SOD (PORK) LOCK FLUSH 100 UNIT/ML IV SOLN
500.0000 [IU] | Freq: Once | INTRAVENOUS | Status: AC | PRN
  Administered 2023-11-10: 500 [IU]

## 2023-11-10 MED ORDER — SODIUM CHLORIDE 0.9 % IV SOLN
100.0000 mg/m2 | Freq: Once | INTRAVENOUS | Status: AC
  Administered 2023-11-10: 200 mg via INTRAVENOUS
  Filled 2023-11-10: qty 10

## 2023-11-12 ENCOUNTER — Inpatient Hospital Stay: Payer: 59

## 2023-11-12 VITALS — BP 121/53 | HR 89 | Temp 97.7°F | Resp 17

## 2023-11-12 DIAGNOSIS — C3411 Malignant neoplasm of upper lobe, right bronchus or lung: Secondary | ICD-10-CM | POA: Diagnosis not present

## 2023-11-12 DIAGNOSIS — Z510 Encounter for antineoplastic radiation therapy: Secondary | ICD-10-CM | POA: Diagnosis not present

## 2023-11-12 DIAGNOSIS — Z923 Personal history of irradiation: Secondary | ICD-10-CM | POA: Diagnosis not present

## 2023-11-12 DIAGNOSIS — Z79634 Long term (current) use of topoisomerase inhibitor: Secondary | ICD-10-CM | POA: Diagnosis not present

## 2023-11-12 DIAGNOSIS — Z5111 Encounter for antineoplastic chemotherapy: Secondary | ICD-10-CM | POA: Diagnosis not present

## 2023-11-12 DIAGNOSIS — Z79620 Long term (current) use of immunosuppressive biologic: Secondary | ICD-10-CM | POA: Diagnosis not present

## 2023-11-12 DIAGNOSIS — Z87891 Personal history of nicotine dependence: Secondary | ICD-10-CM | POA: Diagnosis not present

## 2023-11-12 DIAGNOSIS — Z79630 Long term (current) use of alkylating agent: Secondary | ICD-10-CM | POA: Diagnosis not present

## 2023-11-12 DIAGNOSIS — Z5112 Encounter for antineoplastic immunotherapy: Secondary | ICD-10-CM | POA: Diagnosis not present

## 2023-11-12 DIAGNOSIS — C3412 Malignant neoplasm of upper lobe, left bronchus or lung: Secondary | ICD-10-CM

## 2023-11-12 DIAGNOSIS — Z5189 Encounter for other specified aftercare: Secondary | ICD-10-CM | POA: Diagnosis not present

## 2023-11-12 DIAGNOSIS — Z8551 Personal history of malignant neoplasm of bladder: Secondary | ICD-10-CM | POA: Diagnosis not present

## 2023-11-12 MED ORDER — PEGFILGRASTIM-FPGK 6 MG/0.6ML ~~LOC~~ SOSY
6.0000 mg | PREFILLED_SYRINGE | Freq: Once | SUBCUTANEOUS | Status: AC
  Administered 2023-11-12: 6 mg via SUBCUTANEOUS

## 2023-11-14 ENCOUNTER — Inpatient Hospital Stay

## 2023-11-14 ENCOUNTER — Telehealth: Payer: Self-pay | Admitting: Medical Oncology

## 2023-11-14 DIAGNOSIS — Z5111 Encounter for antineoplastic chemotherapy: Secondary | ICD-10-CM | POA: Diagnosis not present

## 2023-11-14 DIAGNOSIS — C3402 Malignant neoplasm of left main bronchus: Secondary | ICD-10-CM

## 2023-11-14 DIAGNOSIS — Z5189 Encounter for other specified aftercare: Secondary | ICD-10-CM | POA: Diagnosis not present

## 2023-11-14 DIAGNOSIS — C3411 Malignant neoplasm of upper lobe, right bronchus or lung: Secondary | ICD-10-CM | POA: Diagnosis not present

## 2023-11-14 DIAGNOSIS — Z79630 Long term (current) use of alkylating agent: Secondary | ICD-10-CM | POA: Diagnosis not present

## 2023-11-14 DIAGNOSIS — Z510 Encounter for antineoplastic radiation therapy: Secondary | ICD-10-CM | POA: Diagnosis not present

## 2023-11-14 DIAGNOSIS — Z79634 Long term (current) use of topoisomerase inhibitor: Secondary | ICD-10-CM | POA: Diagnosis not present

## 2023-11-14 DIAGNOSIS — Z79620 Long term (current) use of immunosuppressive biologic: Secondary | ICD-10-CM | POA: Diagnosis not present

## 2023-11-14 DIAGNOSIS — Z8551 Personal history of malignant neoplasm of bladder: Secondary | ICD-10-CM | POA: Diagnosis not present

## 2023-11-14 DIAGNOSIS — Z87891 Personal history of nicotine dependence: Secondary | ICD-10-CM | POA: Diagnosis not present

## 2023-11-14 DIAGNOSIS — Z5112 Encounter for antineoplastic immunotherapy: Secondary | ICD-10-CM | POA: Diagnosis not present

## 2023-11-14 DIAGNOSIS — Z95828 Presence of other vascular implants and grafts: Secondary | ICD-10-CM

## 2023-11-14 DIAGNOSIS — Z923 Personal history of irradiation: Secondary | ICD-10-CM | POA: Diagnosis not present

## 2023-11-14 DIAGNOSIS — C3412 Malignant neoplasm of upper lobe, left bronchus or lung: Secondary | ICD-10-CM | POA: Diagnosis not present

## 2023-11-14 LAB — CBC WITH DIFFERENTIAL (CANCER CENTER ONLY)
Abs Immature Granulocytes: 1.4 10*3/uL — ABNORMAL HIGH (ref 0.00–0.07)
Band Neutrophils: 7 %
Basophils Absolute: 0 10*3/uL (ref 0.0–0.1)
Basophils Relative: 0 %
Eosinophils Absolute: 0 10*3/uL (ref 0.0–0.5)
Eosinophils Relative: 0 %
HCT: 29.7 % — ABNORMAL LOW (ref 39.0–52.0)
Hemoglobin: 9.9 g/dL — ABNORMAL LOW (ref 13.0–17.0)
Lymphocytes Relative: 0 %
Lymphs Abs: 0 10*3/uL — ABNORMAL LOW (ref 0.7–4.0)
MCH: 32.8 pg (ref 26.0–34.0)
MCHC: 33.3 g/dL (ref 30.0–36.0)
MCV: 98.3 fL (ref 80.0–100.0)
Metamyelocytes Relative: 2 %
Monocytes Absolute: 0.7 10*3/uL (ref 0.1–1.0)
Monocytes Relative: 1 %
Neutro Abs: 69.5 10*3/uL — ABNORMAL HIGH (ref 1.7–7.7)
Neutrophils Relative %: 90 %
Platelet Count: 209 10*3/uL (ref 150–400)
RBC: 3.02 MIL/uL — ABNORMAL LOW (ref 4.22–5.81)
RDW: 15.4 % (ref 11.5–15.5)
Smear Review: NORMAL
WBC Count: 71.6 10*3/uL (ref 4.0–10.5)
nRBC: 0 % (ref 0.0–0.2)

## 2023-11-14 LAB — CMP (CANCER CENTER ONLY)
ALT: 9 U/L (ref 0–44)
AST: 17 U/L (ref 15–41)
Albumin: 3.6 g/dL (ref 3.5–5.0)
Alkaline Phosphatase: 129 U/L — ABNORMAL HIGH (ref 38–126)
Anion gap: 4 — ABNORMAL LOW (ref 5–15)
BUN: 24 mg/dL — ABNORMAL HIGH (ref 8–23)
CO2: 30 mmol/L (ref 22–32)
Calcium: 9.5 mg/dL (ref 8.9–10.3)
Chloride: 104 mmol/L (ref 98–111)
Creatinine: 0.98 mg/dL (ref 0.61–1.24)
GFR, Estimated: >60 mL/min (ref >60)
Glucose, Bld: 135 mg/dL — ABNORMAL HIGH (ref 70–99)
Potassium: 4.4 mmol/L (ref 3.5–5.1)
Sodium: 138 mmol/L (ref 135–145)
Total Bilirubin: 0.3 mg/dL (ref 0.0–1.2)
Total Protein: 6.7 g/dL (ref 6.5–8.1)

## 2023-11-14 LAB — SAMPLE TO BLOOD BANK

## 2023-11-14 MED ORDER — HEPARIN SOD (PORK) LOCK FLUSH 100 UNIT/ML IV SOLN
500.0000 [IU] | Freq: Once | INTRAVENOUS | Status: AC
Start: 2023-11-14 — End: 2023-11-14
  Administered 2023-11-14: 500 [IU]

## 2023-11-14 MED ORDER — SODIUM CHLORIDE 0.9% FLUSH
10.0000 mL | Freq: Once | INTRAVENOUS | Status: AC
  Administered 2023-11-14: 10 mL

## 2023-11-14 NOTE — Telephone Encounter (Signed)
 CRITICAL VALUE STICKER  CRITICAL VALUE:WBC=71.5k  RECEIVER (on-site recipient of call):Veverly Larimer, RN  DATE & TIME NOTIFIED: 11/14/2023 @ 1135  MESSENGER (representative from lab):Elizabeth  MD NOTIFIED: Marlene Simas , MD  TIME OF NOTIFICATION: 1155  RESPONSE:  On 11/12/2023 he received Neulasta .

## 2023-11-21 ENCOUNTER — Other Ambulatory Visit: Payer: Self-pay | Admitting: Radiation Oncology

## 2023-11-21 ENCOUNTER — Ambulatory Visit
Admission: RE | Admit: 2023-11-21 | Discharge: 2023-11-21 | Disposition: A | Discharge status: Home / Self Care | Source: Ambulatory Visit | Attending: Radiation Oncology | Admitting: Radiation Oncology

## 2023-11-21 ENCOUNTER — Inpatient Hospital Stay

## 2023-11-21 DIAGNOSIS — Z79634 Long term (current) use of topoisomerase inhibitor: Secondary | ICD-10-CM | POA: Diagnosis not present

## 2023-11-21 DIAGNOSIS — Z923 Personal history of irradiation: Secondary | ICD-10-CM | POA: Diagnosis not present

## 2023-11-21 DIAGNOSIS — C3490 Malignant neoplasm of unspecified part of unspecified bronchus or lung: Secondary | ICD-10-CM

## 2023-11-21 DIAGNOSIS — Z5189 Encounter for other specified aftercare: Secondary | ICD-10-CM | POA: Diagnosis not present

## 2023-11-21 DIAGNOSIS — Z95828 Presence of other vascular implants and grafts: Secondary | ICD-10-CM

## 2023-11-21 DIAGNOSIS — Z8551 Personal history of malignant neoplasm of bladder: Secondary | ICD-10-CM | POA: Diagnosis not present

## 2023-11-21 DIAGNOSIS — Z8546 Personal history of malignant neoplasm of prostate: Secondary | ICD-10-CM | POA: Insufficient documentation

## 2023-11-21 DIAGNOSIS — C61 Malignant neoplasm of prostate: Secondary | ICD-10-CM | POA: Insufficient documentation

## 2023-11-21 DIAGNOSIS — C3411 Malignant neoplasm of upper lobe, right bronchus or lung: Secondary | ICD-10-CM | POA: Diagnosis not present

## 2023-11-21 DIAGNOSIS — Z510 Encounter for antineoplastic radiation therapy: Secondary | ICD-10-CM | POA: Diagnosis not present

## 2023-11-21 DIAGNOSIS — Z5111 Encounter for antineoplastic chemotherapy: Secondary | ICD-10-CM | POA: Diagnosis not present

## 2023-11-21 DIAGNOSIS — C3412 Malignant neoplasm of upper lobe, left bronchus or lung: Secondary | ICD-10-CM | POA: Diagnosis not present

## 2023-11-21 DIAGNOSIS — Z5112 Encounter for antineoplastic immunotherapy: Secondary | ICD-10-CM | POA: Diagnosis not present

## 2023-11-21 DIAGNOSIS — C3402 Malignant neoplasm of left main bronchus: Secondary | ICD-10-CM

## 2023-11-21 DIAGNOSIS — Z79620 Long term (current) use of immunosuppressive biologic: Secondary | ICD-10-CM | POA: Diagnosis not present

## 2023-11-21 DIAGNOSIS — Z87891 Personal history of nicotine dependence: Secondary | ICD-10-CM | POA: Diagnosis not present

## 2023-11-21 DIAGNOSIS — Z79630 Long term (current) use of alkylating agent: Secondary | ICD-10-CM | POA: Diagnosis not present

## 2023-11-21 LAB — CMP (CANCER CENTER ONLY)
ALT: 11 U/L (ref 0–44)
AST: 16 U/L (ref 15–41)
Albumin: 3.5 g/dL (ref 3.5–5.0)
Alkaline Phosphatase: 126 U/L (ref 38–126)
Anion gap: 4 — ABNORMAL LOW (ref 5–15)
BUN: 8 mg/dL (ref 8–23)
CO2: 30 mmol/L (ref 22–32)
Calcium: 8.8 mg/dL — ABNORMAL LOW (ref 8.9–10.3)
Chloride: 108 mmol/L (ref 98–111)
Creatinine: 0.84 mg/dL (ref 0.61–1.24)
GFR, Estimated: >60 mL/min (ref >60)
Glucose, Bld: 110 mg/dL — ABNORMAL HIGH (ref 70–99)
Potassium: 3.7 mmol/L (ref 3.5–5.1)
Sodium: 142 mmol/L (ref 135–145)
Total Bilirubin: 0.3 mg/dL (ref 0.0–1.2)
Total Protein: 6.4 g/dL — ABNORMAL LOW (ref 6.5–8.1)

## 2023-11-21 LAB — CBC WITH DIFFERENTIAL (CANCER CENTER ONLY)
Abs Immature Granulocytes: 0.96 10*3/uL — ABNORMAL HIGH (ref 0.00–0.07)
Basophils Absolute: 0.1 10*3/uL (ref 0.0–0.1)
Basophils Relative: 1 %
Eosinophils Absolute: 0.1 10*3/uL (ref 0.0–0.5)
Eosinophils Relative: 0 %
HCT: 30.7 % — ABNORMAL LOW (ref 39.0–52.0)
Hemoglobin: 9.8 g/dL — ABNORMAL LOW (ref 13.0–17.0)
Immature Granulocytes: 4 %
Lymphocytes Relative: 3 %
Lymphs Abs: 0.7 10*3/uL (ref 0.7–4.0)
MCH: 32.1 pg (ref 26.0–34.0)
MCHC: 31.9 g/dL (ref 30.0–36.0)
MCV: 100.7 fL — ABNORMAL HIGH (ref 80.0–100.0)
Monocytes Absolute: 1.7 10*3/uL — ABNORMAL HIGH (ref 0.1–1.0)
Monocytes Relative: 7 %
Neutro Abs: 19.6 10*3/uL — ABNORMAL HIGH (ref 1.7–7.7)
Neutrophils Relative %: 85 %
Platelet Count: 141 10*3/uL — ABNORMAL LOW (ref 150–400)
RBC: 3.05 MIL/uL — ABNORMAL LOW (ref 4.22–5.81)
RDW: 16.9 % — ABNORMAL HIGH (ref 11.5–15.5)
Smear Review: NORMAL
WBC Count: 23.1 10*3/uL — ABNORMAL HIGH (ref 4.0–10.5)
nRBC: 0.3 % — ABNORMAL HIGH (ref 0.0–0.2)

## 2023-11-21 LAB — SAMPLE TO BLOOD BANK

## 2023-11-21 MED ORDER — SODIUM CHLORIDE 0.9% FLUSH
10.0000 mL | Freq: Once | INTRAVENOUS | Status: AC
  Administered 2023-11-21: 10 mL

## 2023-11-21 MED ORDER — HEPARIN SOD (PORK) LOCK FLUSH 100 UNIT/ML IV SOLN
500.0000 [IU] | Freq: Once | INTRAVENOUS | Status: AC
  Administered 2023-11-21: 500 [IU]

## 2023-11-21 NOTE — Progress Notes (Signed)
 I saw the patient today and consented him for SBRT to the RUL. We discussed the small cell diagnosis of his left lung and since he's now finished the chemotherapy we would also recommend prophylactic cranial irradiation. I spoke with John [name] about 10 fractions to the whole brain with namenda and the need for an updated MRI brain prior to proceeding. He will come for SBRT to the RUL and we will also plan to simulate him for whole brain radiation on one of the days he will be finishing SBRT, as well as coordinate his MRI scheduling. I will send in Namenda closer to simulation time for whole brain radiation. We also consented today for whole brain radiation.

## 2023-11-22 ENCOUNTER — Telehealth: Payer: Self-pay | Admitting: *Deleted

## 2023-11-22 NOTE — Telephone Encounter (Signed)
 CALLED PATIENT TO INFORM OF MRI FOR 11-24-23- ARRIVAL TIME- 12 PM @ [facility], NO RESTRICTIONS TO SCAN, SPOKE WITH PATIENT AND HE IS AWARE OF THIS SCAN

## 2023-11-24 ENCOUNTER — Ambulatory Visit (HOSPITAL_COMMUNITY)
Admission: RE | Admit: 2023-11-24 | Discharge: 2023-11-24 | Disposition: A | Discharge status: Home / Self Care | Source: Ambulatory Visit | Attending: Radiation Oncology | Admitting: Radiation Oncology

## 2023-11-24 DIAGNOSIS — C3490 Malignant neoplasm of unspecified part of unspecified bronchus or lung: Secondary | ICD-10-CM | POA: Diagnosis not present

## 2023-11-24 MED ORDER — GADOBUTROL 1 MMOL/ML IV SOLN
7.0000 mL | Freq: Once | INTRAVENOUS | Status: AC | PRN
  Administered 2023-11-24: 7 mL via INTRAVENOUS

## 2023-11-24 MED ORDER — HEPARIN SOD (PORK) LOCK FLUSH 100 UNIT/ML IV SOLN
500.0000 [IU] | Freq: Once | INTRAVENOUS | Status: AC
  Administered 2023-11-24: 500 [IU] via INTRAVENOUS

## 2023-11-25 ENCOUNTER — Encounter (HOSPITAL_COMMUNITY): Payer: Self-pay

## 2023-11-25 ENCOUNTER — Ambulatory Visit (HOSPITAL_COMMUNITY)
Admission: RE | Admit: 2023-11-25 | Discharge: 2023-11-25 | Disposition: A | Discharge status: Home / Self Care | Source: Ambulatory Visit | Attending: Physician Assistant | Admitting: Physician Assistant

## 2023-11-25 DIAGNOSIS — K820 Obstruction of gallbladder: Secondary | ICD-10-CM | POA: Diagnosis not present

## 2023-11-25 DIAGNOSIS — C3402 Malignant neoplasm of left main bronchus: Secondary | ICD-10-CM | POA: Diagnosis not present

## 2023-11-25 DIAGNOSIS — C3411 Malignant neoplasm of upper lobe, right bronchus or lung: Secondary | ICD-10-CM | POA: Diagnosis not present

## 2023-11-25 DIAGNOSIS — I700 Atherosclerosis of aorta: Secondary | ICD-10-CM | POA: Diagnosis not present

## 2023-11-25 DIAGNOSIS — J439 Emphysema, unspecified: Secondary | ICD-10-CM | POA: Diagnosis not present

## 2023-11-25 MED ORDER — SODIUM CHLORIDE (PF) 0.9 % IJ SOLN
INTRAMUSCULAR | Status: AC
  Filled 2023-11-25: qty 50

## 2023-11-25 MED ORDER — HEPARIN SOD (PORK) LOCK FLUSH 100 UNIT/ML IV SOLN
500.0000 [IU] | Freq: Once | INTRAVENOUS | Status: AC
  Administered 2023-11-25: 500 [IU] via INTRAVENOUS

## 2023-11-25 MED ORDER — HEPARIN SOD (PORK) LOCK FLUSH 100 UNIT/ML IV SOLN
INTRAVENOUS | Status: AC
  Filled 2023-11-25: qty 5

## 2023-11-25 MED ORDER — IOHEXOL 300 MG/ML  SOLN
100.0000 mL | Freq: Once | INTRAMUSCULAR | Status: AC | PRN
  Administered 2023-11-25: 100 mL via INTRAVENOUS

## 2023-11-25 NOTE — Progress Notes (Signed)
 I called [facility] reading room and requested the pt's MRI be read in time for his f/u appt with Dr Marguerita Shih on 5/19.  I called [facility] CT and requested his chest CT from today be read STAT in time for the pt's f/u appt with Dr. Marguerita Shih on 5/19.

## 2023-11-28 ENCOUNTER — Inpatient Hospital Stay: Payer: 59 | Admitting: Internal Medicine

## 2023-11-28 ENCOUNTER — Inpatient Hospital Stay: Payer: 59

## 2023-11-28 ENCOUNTER — Telehealth: Payer: Self-pay | Admitting: Radiation Oncology

## 2023-11-28 VITALS — BP 117/57 | HR 80 | Temp 97.8°F | Resp 16 | Ht 72.0 in | Wt 158.5 lb

## 2023-11-28 DIAGNOSIS — C3412 Malignant neoplasm of upper lobe, left bronchus or lung: Secondary | ICD-10-CM

## 2023-11-28 DIAGNOSIS — Z923 Personal history of irradiation: Secondary | ICD-10-CM | POA: Diagnosis not present

## 2023-11-28 DIAGNOSIS — Z5111 Encounter for antineoplastic chemotherapy: Secondary | ICD-10-CM | POA: Diagnosis not present

## 2023-11-28 DIAGNOSIS — C3411 Malignant neoplasm of upper lobe, right bronchus or lung: Secondary | ICD-10-CM | POA: Diagnosis not present

## 2023-11-28 DIAGNOSIS — Z5189 Encounter for other specified aftercare: Secondary | ICD-10-CM | POA: Diagnosis not present

## 2023-11-28 DIAGNOSIS — Z510 Encounter for antineoplastic radiation therapy: Secondary | ICD-10-CM | POA: Diagnosis not present

## 2023-11-28 DIAGNOSIS — C3402 Malignant neoplasm of left main bronchus: Secondary | ICD-10-CM

## 2023-11-28 DIAGNOSIS — Z79634 Long term (current) use of topoisomerase inhibitor: Secondary | ICD-10-CM | POA: Diagnosis not present

## 2023-11-28 DIAGNOSIS — Z79620 Long term (current) use of immunosuppressive biologic: Secondary | ICD-10-CM | POA: Diagnosis not present

## 2023-11-28 DIAGNOSIS — Z8551 Personal history of malignant neoplasm of bladder: Secondary | ICD-10-CM | POA: Diagnosis not present

## 2023-11-28 DIAGNOSIS — Z95828 Presence of other vascular implants and grafts: Secondary | ICD-10-CM

## 2023-11-28 DIAGNOSIS — Z87891 Personal history of nicotine dependence: Secondary | ICD-10-CM | POA: Diagnosis not present

## 2023-11-28 DIAGNOSIS — Z79630 Long term (current) use of alkylating agent: Secondary | ICD-10-CM | POA: Diagnosis not present

## 2023-11-28 DIAGNOSIS — Z5112 Encounter for antineoplastic immunotherapy: Secondary | ICD-10-CM | POA: Diagnosis not present

## 2023-11-28 LAB — CBC WITH DIFFERENTIAL (CANCER CENTER ONLY)
Abs Immature Granulocytes: 0.29 10*3/uL — ABNORMAL HIGH (ref 0.00–0.07)
Basophils Absolute: 0.1 10*3/uL (ref 0.0–0.1)
Basophils Relative: 0 %
Eosinophils Absolute: 0.1 10*3/uL (ref 0.0–0.5)
Eosinophils Relative: 1 %
HCT: 31.6 % — ABNORMAL LOW (ref 39.0–52.0)
Hemoglobin: 10.3 g/dL — ABNORMAL LOW (ref 13.0–17.0)
Immature Granulocytes: 1 %
Lymphocytes Relative: 4 %
Lymphs Abs: 0.8 10*3/uL (ref 0.7–4.0)
MCH: 32.2 pg (ref 26.0–34.0)
MCHC: 32.6 g/dL (ref 30.0–36.0)
MCV: 98.8 fL (ref 80.0–100.0)
Monocytes Absolute: 1.6 10*3/uL — ABNORMAL HIGH (ref 0.1–1.0)
Monocytes Relative: 8 %
Neutro Abs: 18.2 10*3/uL — ABNORMAL HIGH (ref 1.7–7.7)
Neutrophils Relative %: 86 %
Platelet Count: 201 10*3/uL (ref 150–400)
RBC: 3.2 MIL/uL — ABNORMAL LOW (ref 4.22–5.81)
RDW: 15.5 % (ref 11.5–15.5)
WBC Count: 21.1 10*3/uL — ABNORMAL HIGH (ref 4.0–10.5)
nRBC: 0 % (ref 0.0–0.2)

## 2023-11-28 LAB — CMP (CANCER CENTER ONLY)
ALT: 11 U/L (ref 0–44)
AST: 13 U/L — ABNORMAL LOW (ref 15–41)
Albumin: 3.5 g/dL (ref 3.5–5.0)
Alkaline Phosphatase: 90 U/L (ref 38–126)
Anion gap: 3 — ABNORMAL LOW (ref 5–15)
BUN: 10 mg/dL (ref 8–23)
CO2: 31 mmol/L (ref 22–32)
Calcium: 9.4 mg/dL (ref 8.9–10.3)
Chloride: 105 mmol/L (ref 98–111)
Creatinine: 0.83 mg/dL (ref 0.61–1.24)
GFR, Estimated: >60 mL/min (ref >60)
Glucose, Bld: 75 mg/dL (ref 70–99)
Potassium: 4.2 mmol/L (ref 3.5–5.1)
Sodium: 139 mmol/L (ref 135–145)
Total Bilirubin: 0.3 mg/dL (ref 0.0–1.2)
Total Protein: 6.9 g/dL (ref 6.5–8.1)

## 2023-11-28 LAB — SAMPLE TO BLOOD BANK

## 2023-11-28 MED ORDER — SODIUM CHLORIDE 0.9% FLUSH
10.0000 mL | Freq: Once | INTRAVENOUS | Status: AC
  Administered 2023-11-28: 10 mL

## 2023-11-28 MED ORDER — SODIUM CHLORIDE 0.9 % IV SOLN: INTRAVENOUS | Status: DC

## 2023-11-28 MED ORDER — SODIUM CHLORIDE 0.9 % IV SOLN
1500.0000 mg | Freq: Once | INTRAVENOUS | Status: AC
  Administered 2023-11-28: 1500 mg via INTRAVENOUS
  Filled 2023-11-28: qty 30

## 2023-11-28 NOTE — Telephone Encounter (Signed)
 I called the patient to review his MRI and our plan for PCI when he finished his SBRT. We reviewed that Namenda would be called into his pharmacy to start the first day of his brain radiation in June.

## 2023-11-28 NOTE — Progress Notes (Signed)
 [facility] [phone]   [phone]  OFFICE PROGRESS NOTE  Practice, [facility] [address] 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  Presented with a large left hilar/mediastinal mass, multiple bilateral pulmonary nodules, prevascular lymph node, and subcutaneous nodules consistent with metastatic disease.  He was diagnosed in January 2025. 3) history of prostate cancer in 2014 4) history of urothelial carcinoma in 2017   PRIOR THERAPY: 1) palliative course of radiation to the left lung. The last days expected on 09/16/2023. Under the care of Dr. Jeryl Moris. Completed on 09/19/23   CURRENT THERAPY:  2) Palliative systemic chemotherapy with carboplatin  for an AUC of 5 on day 1 and etoposide  100 mg/m2 on days 1, 2 and 3, and Imfinzi  1500 mg IV every 3 weeks with neulsta support. First dose on 09/06/23. Status post 4 cycles.  3) SBRT to the synchronous stage I NSCLC.  Starting from cycle #5 the patient is on maintenance treatment with Imfinzi  1500 Mg IV every 4 weeks.  INTERVAL HISTORY: John [name] 78 y.o. male returns to the clinic today for follow-up visit.Discussed the use of AI scribe software for clinical note transcription with the patient, who gave verbal consent to proceed.  History of Present Illness   John [name] is a 78 year old male with extensive stage small cell lung cancer who presents for evaluation before starting cycle number five of chemotherapy.  Diagnosed in January 2025 with synchronous stage 1A adenocarcinoma of the right upper lobe and extensive stage small cell lung cancer. Underwent radiation to the lung nodule and is currently receiving palliative systemic chemotherapy with carboplatin , etoposide , and Imfinzi  every three weeks. Status post four cycles, now here for evaluation before starting  cycle number five.  Handling treatment better with no new complaints since the last visit. No nausea, vomiting, diarrhea, chest pain, or breathing issues. Previously experienced a sore throat on the right side following initial radiation treatment, which has since resolved.  No weight loss; has gained back some weight after a previous loss.       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 at a level above the muscularis mucosa (per path report)   Frequency of urination 02/11/2021   History of prostate cancer 2014   dx 2013--  Stage T1c,  Gleason 3+3,  PSA 6.38,  vol 25.41cc--  s/p  radioactive seed implants 08-01-2012 urologist-  dr Claretta Croft--  last PSA 0.8  in April 2017   Hydrocele    Hypertension 02/11/2021   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  MEDICATIONS:  Current Outpatient Medications  Medication Sig Dispense Refill   albuterol  (VENTOLIN  HFA) 108 (90 Base) MCG/ACT inhaler Inhale 1-2 puffs into the lungs every 6 (six) hours as needed for wheezing or shortness of breath. 8 g 2   amLODipine (NORVASC)  10 MG tablet Take 10 mg by mouth daily.     COMIRNATY syringe      FLUZONE HIGH-DOSE 0.5 ML injection      lidocaine -prilocaine  (EMLA ) cream Apply 1 Application topically as needed. 30 g 2   prochlorperazine  (COMPAZINE ) 10 MG tablet Take 1 tablet (10 mg total) by mouth every 6 (six) hours as needed. 30 tablet 2   No current facility-administered medications for this visit.    SURGICAL HISTORY:  Past Surgical  History:  Procedure Laterality Date   ABDOMINAL HERNIA REPAIR  1990's   BRONCHIAL BIOPSY  08/08/2023   Procedure: BRONCHIAL BIOPSIES;  Surgeon: Denson Flake, MD;  Location: [facility] ENDOSCOPY;  Service: Pulmonary;;   BRONCHIAL BRUSHINGS  08/08/2023   Procedure: BRONCHIAL BRUSHINGS;  Surgeon: Denson Flake, MD;  Location: [facility] ENDOSCOPY;  Service: Pulmonary;;   BRONCHIAL NEEDLE ASPIRATION BIOPSY  08/08/2023   Procedure: BRONCHIAL NEEDLE ASPIRATION BIOPSIES;  Surgeon: Denson Flake, MD;  Location: [facility] ENDOSCOPY;  Service: Pulmonary;;   CYSTOSCOPY W/ RETROGRADES N/A 11/03/2015   Procedure: CYSTOSCOPY WITH RETROGRADE PYELOGRAM;  Surgeon: Marco Severs, MD;  Location: [facility];  Service: Urology;  Laterality: N/A;   CYSTOSCOPY W/ RETROGRADES Left 01/09/2016   Procedure: CYSTOSCOPY WITH LEFT RETROGRADE PYELOGRAM;  Surgeon: Marco Severs, MD;  Location: [facility];  Service: Urology;  Laterality: Left;   CYSTOSCOPY W/ RETROGRADES Bilateral 05/28/2016   Procedure: CYSTOSCOPY WITH RETROGRADE PYELOGRAM;  Surgeon: Marco Severs, MD;  Location: [facility];  Service: Urology;  Laterality: Bilateral;   CYSTOSCOPY W/ RETROGRADES Bilateral 02/17/2021   Procedure: CYSTOSCOPY WITH RETROGRADE PYELOGRAM;  Surgeon: Sherlyn Ditto, MD;  Location: [facility];  Service: Urology;  Laterality: Bilateral;   CYSTOSCOPY W/ URETERAL STENT REMOVAL Left 01/09/2016   Procedure: CYSTOSCOPY WITH LEFT STENT EXCHANGE;  Surgeon: Marco Severs, MD;  Location[facility];  Service: Urology;  Laterality: Left;   CYSTOSCOPY WITH BIOPSY N/A 05/28/2016   Procedure: CYSTOSCOPY WITH BIOPSY;  Surgeon: Marco Severs, MD;  Location: [facility];  Service: Urology;  Laterality: N/A;   CYSTOSCOPY WITH URETHRAL DILATATION N/A 05/28/2016   Procedure: CYSTOSCOPY WITH URETHRAL DILATATION;  Surgeon: Marco Severs, MD;  Location:  [facility];  Service: Urology;  Laterality: N/A;   HEMOSTASIS CONTROL  08/08/2023   Procedure: HEMOSTASIS CONTROL;  Surgeon: Denson Flake, MD;  Location: [facility] ENDOSCOPY;  Service: Pulmonary;;   IR IMAGING GUIDED PORT INSERTION  09/19/2023   PROSTATE BIOPSY  05/08/12   Adenocarcinoma   RADIOACTIVE SEED IMPLANT  08/01/2012   Procedure: RADIOACTIVE SEED IMPLANT;  Surgeon: Jinny Mounts, MD;  Location: [facility];  Service: Urology;  Laterality: N/A;  73 seeds implanted no seeds found in bladder   TRANSURETHRAL RESECTION OF BLADDER TUMOR N/A 11/03/2015   Procedure: TRANSURETHRAL RESECTION OF BLADDER TUMOR (TURBT)WITH INSERTION STENT;  Surgeon: Marco Severs, MD;  Location: [facility];  Service: Urology;  Laterality: N/A;   TRANSURETHRAL RESECTION OF BLADDER TUMOR N/A 02/17/2021   Procedure: TRANSURETHRAL RESECTION OF BLADDER TUMOR (TURBT);  Surgeon: Sherlyn Ditto, MD;  Location: [facility];  Service: Urology;  Laterality: N/A;  30 MINS   TRANSURETHRAL RESECTION OF BLADDER TUMOR WITH GYRUS (TURBT-GYRUS) N/A 01/09/2016   Procedure: TRANSURETHRAL RESECTION OF BLADDER TUMOR WITH GYRUS (TURBT-GYRUS);  Surgeon: Marco Severs, MD;  Location[facility];  Service: Urology;  Laterality: N/A;  VIDEO BRONCHOSCOPY WITH RADIAL ENDOBRONCHIAL ULTRASOUND  08/08/2023   Procedure: VIDEO BRONCHOSCOPY WITH RADIAL ENDOBRONCHIAL ULTRASOUND;  Surgeon: Denson Flake, MD;  Location: [facility] ENDOSCOPY;  Service: Pulmonary;;    REVIEW OF SYSTEMS:  Constitutional: negative Eyes: negative Ears, nose, mouth, throat, and face: negative Respiratory: negative Cardiovascular: negative Gastrointestinal: negative Genitourinary:negative Integument/breast: negative Hematologic/lymphatic: negative Musculoskeletal:negative Neurological: negative Behavioral/Psych: negative Endocrine: negative Allergic/Immunologic: negative   PHYSICAL  EXAMINATION: General appearance: alert, cooperative, fatigued, and no distress Head: Normocephalic, without obvious abnormality, atraumatic Neck: no adenopathy, no JVD, supple, symmetrical, trachea midline, and thyroid  not enlarged, symmetric, no tenderness/mass/nodules Lymph nodes: Cervical, supraclavicular, and axillary nodes normal. Resp: clear to auscultation bilaterally Back: symmetric, no curvature. ROM normal. No CVA tenderness. Cardio: regular rate and rhythm, S1, S2 normal, no murmur, click, rub or gallop GI: soft, non-tender; bowel sounds normal; no masses,  no organomegaly Extremities: extremities normal, atraumatic, no cyanosis or edema Neurologic: Alert and oriented X 3, normal strength and tone. Normal symmetric reflexes. Normal coordination and gait  ECOG PERFORMANCE STATUS: 1 - Symptomatic but completely ambulatory  Blood pressure (!) 117/57, pulse 80, temperature 97.8 F (36.6 C), temperature source Temporal, resp. rate 16, height 6' (1.829 m), weight 158 lb 8 oz (71.9 kg), SpO2 100%.  LABORATORY DATA: Lab Results  Component Value Date   WBC 23.1 (H) 11/21/2023   HGB 9.8 (L) 11/21/2023   HCT 30.7 (L) 11/21/2023   MCV 100.7 (H) 11/21/2023   PLT 141 (L) 11/21/2023      Chemistry      Component Value Date/Time   NA 142 11/21/2023 1144   K 3.7 11/21/2023 1144   CL 108 11/21/2023 1144   CO2 30 11/21/2023 1144   BUN 8 11/21/2023 1144   CREATININE 0.84 11/21/2023 1144      Component Value Date/Time   CALCIUM 8.8 (L) 11/21/2023 1144   ALKPHOS 126 11/21/2023 1144   AST 16 11/21/2023 1144   ALT 11 11/21/2023 1144   BILITOT 0.3 11/21/2023 1144       RADIOGRAPHIC STUDIES: MR Brain W Wo Contrast Result Date: 11/27/2023 CLINICAL DATA:  78 year old male with small cell lung cancer, extensive stage. Planning for whole brain radiation. EXAM: MRI HEAD WITHOUT AND WITH CONTRAST TECHNIQUE: Multiplanar, multiecho pulse sequences of the brain and surrounding structures were  obtained without and with intravenous contrast. CONTRAST:  7mL GADAVIST  GADOBUTROL  1 MMOL/ML IV SOLN COMPARISON:  Brain MRI 08/19/2023. FINDINGS: Brain: Stable cerebral volume since February. No restricted diffusion to suggest acute infarction. No midline shift, mass effect, evidence of mass lesion, ventriculomegaly, extra-axial collection or acute intracranial hemorrhage. Cervicomedullary junction and pituitary are within normal limits. Stable gray and white matter signal throughout the brain. No abnormal enhancement identified.  No dural thickening. 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 No chronic cerebral blood products on SWI. Vascular: Major intracranial vascular flow voids are preserved. Following contrast the major dural venous sinuses are enhancing and appear to be patent. Skull and upper cervical spine: Negative for age visible cervical spine. Visualized bone marrow signal is within normal limits. Negative visible spinal cord. Sinuses/Orbits: Stable and negative. Other: Mastoids are clear. Visible internal auditory structures appear normal. Negative visible scalp and face. IMPRESSION: No metastatic disease or acute intracranial abnormality. Stable minimal/mild for age chronic small vessel disease. Electronically Signed   By: Marlise Simpers M.D.   On: 11/27/2023 03:53   CT CHEST ABDOMEN PELVIS W CONTRAST Result  Date: 11/25/2023 CLINICAL DATA:  Small cell lung cancer. Assess for treatment response. EXAM: CT CHEST, ABDOMEN, AND PELVIS WITH CONTRAST TECHNIQUE: Multidetector CT imaging of the chest, abdomen and pelvis was performed following the standard protocol during bolus administration of intravenous contrast. RADIATION DOSE REDUCTION: This exam was performed according to the departmental dose-optimization program which includes automated exposure control, adjustment of the mA and/or kV according to patient size and/or use of iterative  reconstruction technique. CONTRAST:  OMNIPAQUE  IOHEXOL  300 MG/ML  SOLN COMPARISON:  CT dated 10/12/2023. FINDINGS: CT CHEST FINDINGS Cardiovascular: There is no cardiomegaly. Trace pericardial effusion. Mild atherosclerotic calcification of the thoracic aorta. No aneurysmal dilatation or dissection. The origins of the great vessels of the aortic arch and the central pulmonary arteries are patent. Mediastinum/Nodes: No hilar adenopathy. Infiltrative tissue in the cardiopulmonary window measures approximately 5.3 x 3.3 cm and slightly decreased since the prior CT. The esophagus is grossly unremarkable. No mediastinal fluid collection. Lungs/Pleura: Background of emphysema. Decrease in the size of the nodule in the posterior right upper lobe since the prior CT. No new consolidation. There is no pleural effusion or pneumothorax. The central airways are patent. Musculoskeletal: Similar sized nodular density in the subcutaneous soft tissues of the right anterior chest wall. No acute osseous pathology. CT ABDOMEN PELVIS FINDINGS No intra-abdominal free air or free fluid. Hepatobiliary: The liver is unremarkable. No biliary dilatation. The gallbladder is contracted. Pancreas: Unremarkable. No pancreatic ductal dilatation or surrounding inflammatory changes. Spleen: Normal in size without focal abnormality. Adrenals/Urinary Tract: The adrenal glands unremarkable. Bilateral renal cysts. There is no hydronephrosis on either side. There is symmetric enhancement and excretion of contrast by both kidneys. The visualized ureters and urinary bladder appear unremarkable. Stomach/Bowel: There is moderate stool throughout the colon. There is no bowel obstruction or active inflammation. The appendix is normal. Vascular/Lymphatic: Moderate aortoiliac atherosclerotic disease. The IVC is unremarkable. No portal venous gas. There is no adenopathy. Reproductive: Prostate brachytherapy seeds. Other: None Musculoskeletal: Degenerative  changes of the spine. No acute osseous pathology. IMPRESSION: 1. Decrease in the size of the nodule in the posterior right upper lobe since the prior CT. 2. Slight decrease in the size of the infiltrative tissue in the cardiopulmonary window. 3. No evidence of metastatic disease in the abdomen or pelvis. 4. Aortic Atherosclerosis (ICD10-I70.0) and Emphysema (ICD10-J43.9). Electronically Signed   By: Angus Bark M.D.   On: 11/25/2023 14:20    ASSESSMENT AND PLAN:  This is a very pleasant 78 years old African-American male with  extensive stage small cell lung cancer.  Presented with a large left hilar/mediastinal mass, multiple bilateral pulmonary nodules, prevascular lymph node, and subcutaneous nodules consistent with metastatic disease.  He was diagnosed in January 2025.  He also has a history of a stage I adenocarcinoma of the right upper lobe status post SBRT. He is currently undergoing systemic chemotherapy with carboplatin  for AUC of 5 on day 1, etoposide  100 Mg/M2 on days 1, 2 and 3 with Neulasta  support in addition to Imfinzi  1500 Mg IV on day 1 every 3 weeks.  He is status post 4 cycles.  Starting from cycle #5 the patient is on maintenance treatment with Imfinzi  1500 Mg IV every 4 weeks. He had repeat CT scan of the chest, abdomen pelvis as well as MRI of the brain performed recently.  His scan showed further improvement of his disease with decrease in the size of the nodule in the posterior right upper lobe and slight decrease in  the size of the infiltrative tissue in the cardiopulmonary window. MRI of the brain showed no metastatic disease to the brain. Extensive stage small cell lung cancer Diagnosed in January 2025, currently undergoing palliative systemic chemotherapy with carboplatin , etoposide , and durvalumab  (Imfinzi ). Status post four cycles of chemotherapy. Recent CT scan of the chest, abdomen, and pelvis shows further decrease in the size of the tumor in the right lung and  mediastinal lymph nodes. MRI of the brain shows no metastatic disease. Tolerating treatment well with no new complaints of nausea, vomiting, diarrhea, or weight loss. No chest pain or dyspnea reported. Lab work is acceptable for treatment. Transitioning to durvalumab  monotherapy every four weeks due to good response and tolerance of previous treatment cycles. - Administer durvalumab  (Imfinzi ) every four weeks as monotherapy - Provide a copy of imaging studies to him  Adenocarcinoma of right upper lobe Stage IA adenocarcinoma of the right upper lobe diagnosed in January 2025. Status post radiation to the lung nodule under the care of Dr. Jeryl Moris. No new issues reported related to this diagnosis.   The patient was advised to call immediately if she has any other concerning symptoms in the interval. The patient voices understanding of current disease status and treatment options and is in agreement with the current care plan.  All questions were answered. The patient knows to call the clinic with any problems, questions or concerns. We can certainly see the patient much sooner if necessary.  The total time spent in the appointment was 30 minutes.  Disclaimer: This note was dictated with voice recognition software. Similar sounding words can inadvertently be transcribed and may not be corrected upon review.

## 2023-11-28 NOTE — Patient Instructions (Signed)

## 2023-11-29 DIAGNOSIS — C3411 Malignant neoplasm of upper lobe, right bronchus or lung: Secondary | ICD-10-CM | POA: Diagnosis not present

## 2023-11-29 DIAGNOSIS — Z79620 Long term (current) use of immunosuppressive biologic: Secondary | ICD-10-CM | POA: Diagnosis not present

## 2023-11-29 DIAGNOSIS — Z87891 Personal history of nicotine dependence: Secondary | ICD-10-CM | POA: Diagnosis not present

## 2023-11-29 DIAGNOSIS — Z8551 Personal history of malignant neoplasm of bladder: Secondary | ICD-10-CM | POA: Diagnosis not present

## 2023-11-29 DIAGNOSIS — Z79630 Long term (current) use of alkylating agent: Secondary | ICD-10-CM | POA: Diagnosis not present

## 2023-11-29 DIAGNOSIS — Z5112 Encounter for antineoplastic immunotherapy: Secondary | ICD-10-CM | POA: Diagnosis not present

## 2023-11-29 DIAGNOSIS — Z5189 Encounter for other specified aftercare: Secondary | ICD-10-CM | POA: Diagnosis not present

## 2023-11-29 DIAGNOSIS — C3412 Malignant neoplasm of upper lobe, left bronchus or lung: Secondary | ICD-10-CM | POA: Diagnosis not present

## 2023-11-29 DIAGNOSIS — Z5111 Encounter for antineoplastic chemotherapy: Secondary | ICD-10-CM | POA: Diagnosis not present

## 2023-11-29 DIAGNOSIS — Z923 Personal history of irradiation: Secondary | ICD-10-CM | POA: Diagnosis not present

## 2023-11-29 DIAGNOSIS — Z79634 Long term (current) use of topoisomerase inhibitor: Secondary | ICD-10-CM | POA: Diagnosis not present

## 2023-11-29 DIAGNOSIS — Z510 Encounter for antineoplastic radiation therapy: Secondary | ICD-10-CM | POA: Diagnosis not present

## 2023-11-30 ENCOUNTER — Ambulatory Visit
Admission: RE | Admit: 2023-11-30 | Discharge: 2023-11-30 | Disposition: A | Discharge status: Home / Self Care | Source: Ambulatory Visit | Attending: Radiation Oncology | Admitting: Radiation Oncology

## 2023-11-30 ENCOUNTER — Other Ambulatory Visit: Payer: Self-pay

## 2023-11-30 DIAGNOSIS — Z87891 Personal history of nicotine dependence: Secondary | ICD-10-CM | POA: Diagnosis not present

## 2023-11-30 DIAGNOSIS — Z5189 Encounter for other specified aftercare: Secondary | ICD-10-CM | POA: Diagnosis not present

## 2023-11-30 DIAGNOSIS — Z5111 Encounter for antineoplastic chemotherapy: Secondary | ICD-10-CM | POA: Diagnosis not present

## 2023-11-30 DIAGNOSIS — Z5112 Encounter for antineoplastic immunotherapy: Secondary | ICD-10-CM | POA: Diagnosis not present

## 2023-11-30 DIAGNOSIS — C3412 Malignant neoplasm of upper lobe, left bronchus or lung: Secondary | ICD-10-CM | POA: Diagnosis not present

## 2023-11-30 DIAGNOSIS — Z79634 Long term (current) use of topoisomerase inhibitor: Secondary | ICD-10-CM | POA: Diagnosis not present

## 2023-11-30 DIAGNOSIS — Z79620 Long term (current) use of immunosuppressive biologic: Secondary | ICD-10-CM | POA: Diagnosis not present

## 2023-11-30 DIAGNOSIS — Z79630 Long term (current) use of alkylating agent: Secondary | ICD-10-CM | POA: Diagnosis not present

## 2023-11-30 DIAGNOSIS — C3411 Malignant neoplasm of upper lobe, right bronchus or lung: Secondary | ICD-10-CM | POA: Diagnosis not present

## 2023-11-30 DIAGNOSIS — Z510 Encounter for antineoplastic radiation therapy: Secondary | ICD-10-CM | POA: Diagnosis not present

## 2023-11-30 DIAGNOSIS — Z8551 Personal history of malignant neoplasm of bladder: Secondary | ICD-10-CM | POA: Diagnosis not present

## 2023-11-30 DIAGNOSIS — Z923 Personal history of irradiation: Secondary | ICD-10-CM | POA: Diagnosis not present

## 2023-11-30 LAB — RAD ONC ARIA SESSION SUMMARY
Course Elapsed Days: 0
Plan Fractions Treated to Date: 1
Plan Prescribed Dose Per Fraction: 18 Gy
Plan Total Fractions Prescribed: 3
Plan Total Prescribed Dose: 54 Gy
Reference Point Dosage Given to Date: 18 Gy
Reference Point Session Dosage Given: 18 Gy
Session Number: 1

## 2023-12-02 ENCOUNTER — Ambulatory Visit
Admission: RE | Admit: 2023-12-02 | Discharge: 2023-12-02 | Disposition: A | Discharge status: Home / Self Care | Source: Ambulatory Visit | Attending: Radiation Oncology | Admitting: Radiation Oncology

## 2023-12-02 ENCOUNTER — Other Ambulatory Visit: Payer: Self-pay

## 2023-12-02 DIAGNOSIS — Z5189 Encounter for other specified aftercare: Secondary | ICD-10-CM | POA: Diagnosis not present

## 2023-12-02 DIAGNOSIS — Z923 Personal history of irradiation: Secondary | ICD-10-CM | POA: Diagnosis not present

## 2023-12-02 DIAGNOSIS — C3411 Malignant neoplasm of upper lobe, right bronchus or lung: Secondary | ICD-10-CM | POA: Diagnosis not present

## 2023-12-02 DIAGNOSIS — Z79634 Long term (current) use of topoisomerase inhibitor: Secondary | ICD-10-CM | POA: Diagnosis not present

## 2023-12-02 DIAGNOSIS — Z8551 Personal history of malignant neoplasm of bladder: Secondary | ICD-10-CM | POA: Diagnosis not present

## 2023-12-02 DIAGNOSIS — Z510 Encounter for antineoplastic radiation therapy: Secondary | ICD-10-CM | POA: Diagnosis not present

## 2023-12-02 DIAGNOSIS — Z87891 Personal history of nicotine dependence: Secondary | ICD-10-CM | POA: Diagnosis not present

## 2023-12-02 DIAGNOSIS — Z79630 Long term (current) use of alkylating agent: Secondary | ICD-10-CM | POA: Diagnosis not present

## 2023-12-02 DIAGNOSIS — C3412 Malignant neoplasm of upper lobe, left bronchus or lung: Secondary | ICD-10-CM | POA: Diagnosis not present

## 2023-12-02 DIAGNOSIS — Z5112 Encounter for antineoplastic immunotherapy: Secondary | ICD-10-CM | POA: Diagnosis not present

## 2023-12-02 DIAGNOSIS — Z79620 Long term (current) use of immunosuppressive biologic: Secondary | ICD-10-CM | POA: Diagnosis not present

## 2023-12-02 DIAGNOSIS — Z5111 Encounter for antineoplastic chemotherapy: Secondary | ICD-10-CM | POA: Diagnosis not present

## 2023-12-02 LAB — RAD ONC ARIA SESSION SUMMARY
Course Elapsed Days: 2
Plan Fractions Treated to Date: 2
Plan Prescribed Dose Per Fraction: 18 Gy
Plan Total Fractions Prescribed: 3
Plan Total Prescribed Dose: 54 Gy
Reference Point Dosage Given to Date: 36 Gy
Reference Point Session Dosage Given: 18 Gy
Session Number: 2

## 2023-12-06 ENCOUNTER — Ambulatory Visit: Admitting: Radiation Oncology

## 2023-12-06 ENCOUNTER — Other Ambulatory Visit

## 2023-12-06 ENCOUNTER — Ambulatory Visit

## 2023-12-07 ENCOUNTER — Ambulatory Visit

## 2023-12-07 ENCOUNTER — Other Ambulatory Visit: Payer: Self-pay | Admitting: Radiation Oncology

## 2023-12-07 ENCOUNTER — Ambulatory Visit: Admitting: Radiation Oncology

## 2023-12-07 ENCOUNTER — Ambulatory Visit
Admission: RE | Admit: 2023-12-07 | Discharge: 2023-12-07 | Discharge status: Home / Self Care | Source: Ambulatory Visit | Attending: Radiation Oncology | Admitting: Radiation Oncology

## 2023-12-07 ENCOUNTER — Other Ambulatory Visit: Payer: Self-pay

## 2023-12-07 ENCOUNTER — Ambulatory Visit
Admission: RE | Admit: 2023-12-07 | Discharge: 2023-12-07 | Disposition: A | Discharge status: Home / Self Care | Source: Ambulatory Visit | Attending: Radiation Oncology | Admitting: Radiation Oncology

## 2023-12-07 DIAGNOSIS — Z8551 Personal history of malignant neoplasm of bladder: Secondary | ICD-10-CM | POA: Diagnosis not present

## 2023-12-07 DIAGNOSIS — Z79630 Long term (current) use of alkylating agent: Secondary | ICD-10-CM | POA: Diagnosis not present

## 2023-12-07 DIAGNOSIS — C3412 Malignant neoplasm of upper lobe, left bronchus or lung: Secondary | ICD-10-CM | POA: Diagnosis not present

## 2023-12-07 DIAGNOSIS — Z79620 Long term (current) use of immunosuppressive biologic: Secondary | ICD-10-CM | POA: Diagnosis not present

## 2023-12-07 DIAGNOSIS — Z87891 Personal history of nicotine dependence: Secondary | ICD-10-CM | POA: Diagnosis not present

## 2023-12-07 DIAGNOSIS — C7931 Secondary malignant neoplasm of brain: Secondary | ICD-10-CM | POA: Diagnosis not present

## 2023-12-07 DIAGNOSIS — C3411 Malignant neoplasm of upper lobe, right bronchus or lung: Secondary | ICD-10-CM | POA: Diagnosis not present

## 2023-12-07 DIAGNOSIS — Z79634 Long term (current) use of topoisomerase inhibitor: Secondary | ICD-10-CM | POA: Diagnosis not present

## 2023-12-07 DIAGNOSIS — Z5111 Encounter for antineoplastic chemotherapy: Secondary | ICD-10-CM | POA: Diagnosis not present

## 2023-12-07 DIAGNOSIS — Z510 Encounter for antineoplastic radiation therapy: Secondary | ICD-10-CM | POA: Diagnosis not present

## 2023-12-07 DIAGNOSIS — Z5112 Encounter for antineoplastic immunotherapy: Secondary | ICD-10-CM | POA: Diagnosis not present

## 2023-12-07 DIAGNOSIS — Z923 Personal history of irradiation: Secondary | ICD-10-CM | POA: Diagnosis not present

## 2023-12-07 DIAGNOSIS — Z5189 Encounter for other specified aftercare: Secondary | ICD-10-CM | POA: Diagnosis not present

## 2023-12-07 LAB — RAD ONC ARIA SESSION SUMMARY
Course Elapsed Days: 7
Plan Fractions Treated to Date: 3
Plan Prescribed Dose Per Fraction: 18 Gy
Plan Total Fractions Prescribed: 3
Plan Total Prescribed Dose: 54 Gy
Reference Point Dosage Given to Date: 54 Gy
Reference Point Session Dosage Given: 18 Gy
Session Number: 3

## 2023-12-07 MED ORDER — MEMANTINE HCL 5 MG PO TABS: ORAL_TABLET | ORAL | 0 refills | Status: AC

## 2023-12-07 MED ORDER — MEMANTINE HCL 10 MG PO TABS: 10.0000 mg | ORAL_TABLET | Freq: Two times a day (BID) | ORAL | 4 refills | Status: AC

## 2023-12-08 ENCOUNTER — Ambulatory Visit: Admitting: Radiation Oncology

## 2023-12-08 NOTE — Radiation Completion Notes (Signed)
 Patient Name: John [name], TROMBETTA MRN: [id number] Date of Birth: 08-18-1945 Referring Physician: Racheal Buddle, M.D. Date of Service: 2023-12-08 Radiation Oncologist: Johna Myers, M.D. [facility]                             RADIATION ONCOLOGY END OF TREATMENT NOTE     Diagnosis: C34.11 Malignant neoplasm of upper lobe, right bronchus or lung Staging on 2023-08-25: Malignant neoplasm of right upper lobe of lung (HCC) T=cT1c, N=cN0, M=cM0 Staging on 2023-08-25: Malignant neoplasm of upper lobe of left lung (HCC) T=cT4, N=cN2b, M=cM1c2 Intent: Palliative     ==========DELIVERED PLANS==========  First Treatment Date: 2023-11-30 Last Treatment Date: 2023-12-07   Plan Name: Lung_R_SBRT Site: Lung, Right Technique: SBRT/SRT-IMRT Mode: Photon Dose Per Fraction: 18 Gy Prescribed Dose (Delivered / Prescribed): 54 Gy / 54 Gy Prescribed Fxs (Delivered / Prescribed): 3 / 3     ==========ON TREATMENT VISIT DATES========== 2023-11-30, 2023-12-02, 2023-12-07     ==========UPCOMING VISITS========== 12/26/2023 [facility]-RAD[facility] ONC FINAL TREATMENT [facility]RAD[facility] LINAC 3  12/26/2023 [facility]-MED ONCOLOGY PORT FLUSH W/LAB [facility] FLUSH  12/26/2023 [facility]-MED ONCOLOGY EST PT 30 Heilingoetter, Cassandra L, PA-C  12/26/2023 [facility] ONCOLOGY INFUSION 2HR30MIN (150) [facility] INFUSION  12/23/2023 [facility]-RADIATION ONC DAILY TREATMENT [facility] LINAC 3  12/22/2023 [facility] ONC DAILY TREATMENT [facility] LINAC 3  12/21/2023 [facility] ONC DAILY TREATMENT [facility] LINAC 3  12/20/2023 [facility]IATION ONC DAILY TREATMENT [facility] LINAC 3  12/19/2023 [facility]RADIATION ONC DAILY TREATMENT [facility] LINAC 3  12/16/2023 [facility] ONC DAILY TREATMENT [facility] LINAC 3  12/15/2023 [facility] ONC DAILY TREATMENT [facility] LINAC 3  12/14/2023 [facility] ONC DAILY TREATMENT [facility] LINAC 3  12/13/2023 [facility] ONC PORT AND  TREAT Johna Myers, MD        ==========APPENDIX - ON TREATMENT VISIT NOTES==========   See weekly On Treatment Notes in Epic for details in the Media tab (listed as Progress notes on the On Treatment Visit Dates listed above).

## 2023-12-08 NOTE — Progress Notes (Signed)
  Radiation Oncology         [phone] ________________________________  Name: John [name] MRN: [id number]  Date: 12/07/2023  DOB: Jul 02, 1946  End of Treatment Note  Diagnosis:   Extensive Stage Small Cell Carcinoma of the Left lung and synchronous Stage IA3, cT1cN0M0, NSCLC, adenocarcinoma of the RUL   Indication for treatment:  Curative       Radiation treatment dates:   11/30/23-12/07/23  Site/dose:   The tumor in the RUL was treated with a course of stereotactic body radiation treatment. The patient received 54 Gy In 3 fractions at 18 G per fraction.  Narrative: The patient tolerated radiation treatment relatively well.   The patient did not have any signs of acute toxicity during treatment.  Plan: The patient has done well with SBRT treatment. He is scheduled for whole brain PCI therapy to begin next week. We reviewed the rationale for Namenda  and this was sent to his pharmacy after we discussed side effect profile.       Shelvia Dick, PAC

## 2023-12-09 ENCOUNTER — Ambulatory Visit: Admitting: Radiation Oncology

## 2023-12-12 ENCOUNTER — Ambulatory Visit: Admitting: Radiation Oncology

## 2023-12-12 ENCOUNTER — Other Ambulatory Visit

## 2023-12-12 ENCOUNTER — Ambulatory Visit
Admission: RE | Admit: 2023-12-12 | Discharge: 2023-12-12 | Disposition: A | Discharge status: Home / Self Care | Source: Ambulatory Visit | Attending: Radiation Oncology | Admitting: Radiation Oncology

## 2023-12-12 DIAGNOSIS — Z8551 Personal history of malignant neoplasm of bladder: Secondary | ICD-10-CM | POA: Diagnosis not present

## 2023-12-12 DIAGNOSIS — Z79634 Long term (current) use of topoisomerase inhibitor: Secondary | ICD-10-CM | POA: Diagnosis not present

## 2023-12-12 DIAGNOSIS — Z5112 Encounter for antineoplastic immunotherapy: Secondary | ICD-10-CM | POA: Diagnosis not present

## 2023-12-12 DIAGNOSIS — Z8546 Personal history of malignant neoplasm of prostate: Secondary | ICD-10-CM | POA: Insufficient documentation

## 2023-12-12 DIAGNOSIS — C3411 Malignant neoplasm of upper lobe, right bronchus or lung: Secondary | ICD-10-CM | POA: Insufficient documentation

## 2023-12-12 DIAGNOSIS — C3412 Malignant neoplasm of upper lobe, left bronchus or lung: Secondary | ICD-10-CM | POA: Diagnosis not present

## 2023-12-12 DIAGNOSIS — Z79620 Long term (current) use of immunosuppressive biologic: Secondary | ICD-10-CM | POA: Insufficient documentation

## 2023-12-12 DIAGNOSIS — Z79630 Long term (current) use of alkylating agent: Secondary | ICD-10-CM | POA: Diagnosis not present

## 2023-12-12 DIAGNOSIS — C61 Malignant neoplasm of prostate: Secondary | ICD-10-CM | POA: Diagnosis not present

## 2023-12-12 DIAGNOSIS — Z5111 Encounter for antineoplastic chemotherapy: Secondary | ICD-10-CM | POA: Insufficient documentation

## 2023-12-12 DIAGNOSIS — C7931 Secondary malignant neoplasm of brain: Secondary | ICD-10-CM | POA: Diagnosis not present

## 2023-12-12 DIAGNOSIS — Z510 Encounter for antineoplastic radiation therapy: Secondary | ICD-10-CM | POA: Diagnosis not present

## 2023-12-12 DIAGNOSIS — Z87891 Personal history of nicotine dependence: Secondary | ICD-10-CM | POA: Diagnosis not present

## 2023-12-13 ENCOUNTER — Other Ambulatory Visit: Payer: Self-pay

## 2023-12-13 DIAGNOSIS — Z79634 Long term (current) use of topoisomerase inhibitor: Secondary | ICD-10-CM | POA: Diagnosis not present

## 2023-12-13 DIAGNOSIS — C7931 Secondary malignant neoplasm of brain: Secondary | ICD-10-CM | POA: Diagnosis not present

## 2023-12-13 DIAGNOSIS — Z5112 Encounter for antineoplastic immunotherapy: Secondary | ICD-10-CM | POA: Diagnosis not present

## 2023-12-13 DIAGNOSIS — Z87891 Personal history of nicotine dependence: Secondary | ICD-10-CM | POA: Diagnosis not present

## 2023-12-13 DIAGNOSIS — Z5111 Encounter for antineoplastic chemotherapy: Secondary | ICD-10-CM | POA: Diagnosis not present

## 2023-12-13 DIAGNOSIS — Z79620 Long term (current) use of immunosuppressive biologic: Secondary | ICD-10-CM | POA: Diagnosis not present

## 2023-12-13 DIAGNOSIS — Z510 Encounter for antineoplastic radiation therapy: Secondary | ICD-10-CM | POA: Diagnosis not present

## 2023-12-13 DIAGNOSIS — Z8551 Personal history of malignant neoplasm of bladder: Secondary | ICD-10-CM | POA: Diagnosis not present

## 2023-12-13 DIAGNOSIS — C3412 Malignant neoplasm of upper lobe, left bronchus or lung: Secondary | ICD-10-CM | POA: Diagnosis not present

## 2023-12-13 DIAGNOSIS — Z79630 Long term (current) use of alkylating agent: Secondary | ICD-10-CM | POA: Diagnosis not present

## 2023-12-13 DIAGNOSIS — C3411 Malignant neoplasm of upper lobe, right bronchus or lung: Secondary | ICD-10-CM | POA: Diagnosis not present

## 2023-12-13 LAB — RAD ONC ARIA SESSION SUMMARY
Course Elapsed Days: 13
Plan Fractions Treated to Date: 1
Plan Prescribed Dose Per Fraction: 2.5 Gy
Plan Total Fractions Prescribed: 10
Plan Total Prescribed Dose: 25 Gy
Reference Point Dosage Given to Date: 2.5 Gy
Reference Point Session Dosage Given: 2.5 Gy
Session Number: 4

## 2023-12-14 ENCOUNTER — Ambulatory Visit
Admission: RE | Admit: 2023-12-14 | Discharge: 2023-12-14 | Disposition: A | Discharge status: Home / Self Care | Source: Ambulatory Visit | Attending: Radiation Oncology | Admitting: Radiation Oncology

## 2023-12-14 ENCOUNTER — Other Ambulatory Visit: Payer: Self-pay

## 2023-12-14 DIAGNOSIS — Z87891 Personal history of nicotine dependence: Secondary | ICD-10-CM | POA: Diagnosis not present

## 2023-12-14 DIAGNOSIS — C3412 Malignant neoplasm of upper lobe, left bronchus or lung: Secondary | ICD-10-CM | POA: Diagnosis not present

## 2023-12-14 DIAGNOSIS — C3411 Malignant neoplasm of upper lobe, right bronchus or lung: Secondary | ICD-10-CM | POA: Diagnosis not present

## 2023-12-14 DIAGNOSIS — Z8551 Personal history of malignant neoplasm of bladder: Secondary | ICD-10-CM | POA: Diagnosis not present

## 2023-12-14 DIAGNOSIS — C7931 Secondary malignant neoplasm of brain: Secondary | ICD-10-CM | POA: Diagnosis not present

## 2023-12-14 DIAGNOSIS — Z510 Encounter for antineoplastic radiation therapy: Secondary | ICD-10-CM | POA: Diagnosis not present

## 2023-12-14 DIAGNOSIS — Z5112 Encounter for antineoplastic immunotherapy: Secondary | ICD-10-CM | POA: Diagnosis not present

## 2023-12-14 DIAGNOSIS — Z79634 Long term (current) use of topoisomerase inhibitor: Secondary | ICD-10-CM | POA: Diagnosis not present

## 2023-12-14 DIAGNOSIS — Z79630 Long term (current) use of alkylating agent: Secondary | ICD-10-CM | POA: Diagnosis not present

## 2023-12-14 DIAGNOSIS — Z5111 Encounter for antineoplastic chemotherapy: Secondary | ICD-10-CM | POA: Diagnosis not present

## 2023-12-14 DIAGNOSIS — Z79620 Long term (current) use of immunosuppressive biologic: Secondary | ICD-10-CM | POA: Diagnosis not present

## 2023-12-14 LAB — RAD ONC ARIA SESSION SUMMARY
Course Elapsed Days: 14
Plan Fractions Treated to Date: 2
Plan Prescribed Dose Per Fraction: 2.5 Gy
Plan Total Fractions Prescribed: 10
Plan Total Prescribed Dose: 25 Gy
Reference Point Dosage Given to Date: 5 Gy
Reference Point Session Dosage Given: 2.5 Gy
Session Number: 5

## 2023-12-15 ENCOUNTER — Other Ambulatory Visit: Payer: Self-pay

## 2023-12-15 ENCOUNTER — Ambulatory Visit
Admission: RE | Admit: 2023-12-15 | Discharge: 2023-12-15 | Disposition: A | Discharge status: Home / Self Care | Source: Ambulatory Visit | Attending: Radiation Oncology | Admitting: Radiation Oncology

## 2023-12-15 DIAGNOSIS — Z5112 Encounter for antineoplastic immunotherapy: Secondary | ICD-10-CM | POA: Diagnosis not present

## 2023-12-15 DIAGNOSIS — C3412 Malignant neoplasm of upper lobe, left bronchus or lung: Secondary | ICD-10-CM | POA: Diagnosis not present

## 2023-12-15 DIAGNOSIS — Z8551 Personal history of malignant neoplasm of bladder: Secondary | ICD-10-CM | POA: Diagnosis not present

## 2023-12-15 DIAGNOSIS — Z5111 Encounter for antineoplastic chemotherapy: Secondary | ICD-10-CM | POA: Diagnosis not present

## 2023-12-15 DIAGNOSIS — C7931 Secondary malignant neoplasm of brain: Secondary | ICD-10-CM | POA: Diagnosis not present

## 2023-12-15 DIAGNOSIS — Z510 Encounter for antineoplastic radiation therapy: Secondary | ICD-10-CM | POA: Diagnosis not present

## 2023-12-15 DIAGNOSIS — Z79634 Long term (current) use of topoisomerase inhibitor: Secondary | ICD-10-CM | POA: Diagnosis not present

## 2023-12-15 DIAGNOSIS — Z87891 Personal history of nicotine dependence: Secondary | ICD-10-CM | POA: Diagnosis not present

## 2023-12-15 DIAGNOSIS — C3411 Malignant neoplasm of upper lobe, right bronchus or lung: Secondary | ICD-10-CM | POA: Diagnosis not present

## 2023-12-15 DIAGNOSIS — Z79630 Long term (current) use of alkylating agent: Secondary | ICD-10-CM | POA: Diagnosis not present

## 2023-12-15 DIAGNOSIS — Z79620 Long term (current) use of immunosuppressive biologic: Secondary | ICD-10-CM | POA: Diagnosis not present

## 2023-12-15 LAB — RAD ONC ARIA SESSION SUMMARY
Course Elapsed Days: 15
Plan Fractions Treated to Date: 3
Plan Prescribed Dose Per Fraction: 2.5 Gy
Plan Total Fractions Prescribed: 10
Plan Total Prescribed Dose: 25 Gy
Reference Point Dosage Given to Date: 7.5 Gy
Reference Point Session Dosage Given: 2.5 Gy
Session Number: 6

## 2023-12-16 ENCOUNTER — Other Ambulatory Visit: Payer: Self-pay

## 2023-12-16 ENCOUNTER — Ambulatory Visit
Admission: RE | Admit: 2023-12-16 | Discharge: 2023-12-16 | Disposition: A | Discharge status: Home / Self Care | Source: Ambulatory Visit | Attending: Radiation Oncology | Admitting: Radiation Oncology

## 2023-12-16 DIAGNOSIS — Z87891 Personal history of nicotine dependence: Secondary | ICD-10-CM | POA: Diagnosis not present

## 2023-12-16 DIAGNOSIS — Z79634 Long term (current) use of topoisomerase inhibitor: Secondary | ICD-10-CM | POA: Diagnosis not present

## 2023-12-16 DIAGNOSIS — C3411 Malignant neoplasm of upper lobe, right bronchus or lung: Secondary | ICD-10-CM | POA: Diagnosis not present

## 2023-12-16 DIAGNOSIS — Z5111 Encounter for antineoplastic chemotherapy: Secondary | ICD-10-CM | POA: Diagnosis not present

## 2023-12-16 DIAGNOSIS — Z8551 Personal history of malignant neoplasm of bladder: Secondary | ICD-10-CM | POA: Diagnosis not present

## 2023-12-16 DIAGNOSIS — Z510 Encounter for antineoplastic radiation therapy: Secondary | ICD-10-CM | POA: Diagnosis not present

## 2023-12-16 DIAGNOSIS — C3412 Malignant neoplasm of upper lobe, left bronchus or lung: Secondary | ICD-10-CM | POA: Diagnosis not present

## 2023-12-16 DIAGNOSIS — Z79630 Long term (current) use of alkylating agent: Secondary | ICD-10-CM | POA: Diagnosis not present

## 2023-12-16 DIAGNOSIS — C7931 Secondary malignant neoplasm of brain: Secondary | ICD-10-CM | POA: Diagnosis not present

## 2023-12-16 DIAGNOSIS — Z79620 Long term (current) use of immunosuppressive biologic: Secondary | ICD-10-CM | POA: Diagnosis not present

## 2023-12-16 DIAGNOSIS — Z5112 Encounter for antineoplastic immunotherapy: Secondary | ICD-10-CM | POA: Diagnosis not present

## 2023-12-16 LAB — RAD ONC ARIA SESSION SUMMARY
Course Elapsed Days: 16
Plan Fractions Treated to Date: 4
Plan Prescribed Dose Per Fraction: 2.5 Gy
Plan Total Fractions Prescribed: 10
Plan Total Prescribed Dose: 25 Gy
Reference Point Dosage Given to Date: 10 Gy
Reference Point Session Dosage Given: 2.5 Gy
Session Number: 7

## 2023-12-19 ENCOUNTER — Other Ambulatory Visit: Payer: Self-pay

## 2023-12-19 ENCOUNTER — Ambulatory Visit
Admission: RE | Admit: 2023-12-19 | Discharge: 2023-12-19 | Disposition: A | Discharge status: Home / Self Care | Source: Ambulatory Visit | Attending: Radiation Oncology | Admitting: Radiation Oncology

## 2023-12-19 ENCOUNTER — Other Ambulatory Visit

## 2023-12-19 DIAGNOSIS — C7931 Secondary malignant neoplasm of brain: Secondary | ICD-10-CM | POA: Diagnosis not present

## 2023-12-19 DIAGNOSIS — Z79630 Long term (current) use of alkylating agent: Secondary | ICD-10-CM | POA: Diagnosis not present

## 2023-12-19 DIAGNOSIS — Z87891 Personal history of nicotine dependence: Secondary | ICD-10-CM | POA: Diagnosis not present

## 2023-12-19 DIAGNOSIS — C3411 Malignant neoplasm of upper lobe, right bronchus or lung: Secondary | ICD-10-CM | POA: Diagnosis not present

## 2023-12-19 DIAGNOSIS — Z8551 Personal history of malignant neoplasm of bladder: Secondary | ICD-10-CM | POA: Diagnosis not present

## 2023-12-19 DIAGNOSIS — Z510 Encounter for antineoplastic radiation therapy: Secondary | ICD-10-CM | POA: Diagnosis not present

## 2023-12-19 DIAGNOSIS — Z79620 Long term (current) use of immunosuppressive biologic: Secondary | ICD-10-CM | POA: Diagnosis not present

## 2023-12-19 DIAGNOSIS — Z79634 Long term (current) use of topoisomerase inhibitor: Secondary | ICD-10-CM | POA: Diagnosis not present

## 2023-12-19 DIAGNOSIS — C3412 Malignant neoplasm of upper lobe, left bronchus or lung: Secondary | ICD-10-CM | POA: Diagnosis not present

## 2023-12-19 DIAGNOSIS — Z5111 Encounter for antineoplastic chemotherapy: Secondary | ICD-10-CM | POA: Diagnosis not present

## 2023-12-19 DIAGNOSIS — Z5112 Encounter for antineoplastic immunotherapy: Secondary | ICD-10-CM | POA: Diagnosis not present

## 2023-12-19 LAB — RAD ONC ARIA SESSION SUMMARY
Course Elapsed Days: 19
Plan Fractions Treated to Date: 5
Plan Prescribed Dose Per Fraction: 2.5 Gy
Plan Total Fractions Prescribed: 10
Plan Total Prescribed Dose: 25 Gy
Reference Point Dosage Given to Date: 12.5 Gy
Reference Point Session Dosage Given: 2.5 Gy
Session Number: 8

## 2023-12-20 ENCOUNTER — Ambulatory Visit
Admission: RE | Admit: 2023-12-20 | Discharge: 2023-12-20 | Disposition: A | Discharge status: Home / Self Care | Source: Ambulatory Visit | Attending: Radiation Oncology | Admitting: Radiation Oncology

## 2023-12-20 ENCOUNTER — Other Ambulatory Visit: Payer: Self-pay

## 2023-12-20 DIAGNOSIS — Z5112 Encounter for antineoplastic immunotherapy: Secondary | ICD-10-CM | POA: Diagnosis not present

## 2023-12-20 DIAGNOSIS — Z87891 Personal history of nicotine dependence: Secondary | ICD-10-CM | POA: Diagnosis not present

## 2023-12-20 DIAGNOSIS — Z79620 Long term (current) use of immunosuppressive biologic: Secondary | ICD-10-CM | POA: Diagnosis not present

## 2023-12-20 DIAGNOSIS — C3411 Malignant neoplasm of upper lobe, right bronchus or lung: Secondary | ICD-10-CM | POA: Diagnosis not present

## 2023-12-20 DIAGNOSIS — C3412 Malignant neoplasm of upper lobe, left bronchus or lung: Secondary | ICD-10-CM | POA: Diagnosis not present

## 2023-12-20 DIAGNOSIS — Z79634 Long term (current) use of topoisomerase inhibitor: Secondary | ICD-10-CM | POA: Diagnosis not present

## 2023-12-20 DIAGNOSIS — Z510 Encounter for antineoplastic radiation therapy: Secondary | ICD-10-CM | POA: Diagnosis not present

## 2023-12-20 DIAGNOSIS — Z79630 Long term (current) use of alkylating agent: Secondary | ICD-10-CM | POA: Diagnosis not present

## 2023-12-20 DIAGNOSIS — Z8551 Personal history of malignant neoplasm of bladder: Secondary | ICD-10-CM | POA: Diagnosis not present

## 2023-12-20 DIAGNOSIS — C7931 Secondary malignant neoplasm of brain: Secondary | ICD-10-CM | POA: Diagnosis not present

## 2023-12-20 DIAGNOSIS — Z5111 Encounter for antineoplastic chemotherapy: Secondary | ICD-10-CM | POA: Diagnosis not present

## 2023-12-20 LAB — RAD ONC ARIA SESSION SUMMARY
Course Elapsed Days: 20
Plan Fractions Treated to Date: 6
Plan Prescribed Dose Per Fraction: 2.5 Gy
Plan Total Fractions Prescribed: 10
Plan Total Prescribed Dose: 25 Gy
Reference Point Dosage Given to Date: 15 Gy
Reference Point Session Dosage Given: 2.5 Gy
Session Number: 9

## 2023-12-21 ENCOUNTER — Ambulatory Visit
Admission: RE | Admit: 2023-12-21 | Discharge: 2023-12-21 | Disposition: A | Discharge status: Home / Self Care | Source: Ambulatory Visit | Attending: Radiation Oncology | Admitting: Radiation Oncology

## 2023-12-21 ENCOUNTER — Other Ambulatory Visit: Payer: Self-pay

## 2023-12-21 DIAGNOSIS — Z79634 Long term (current) use of topoisomerase inhibitor: Secondary | ICD-10-CM | POA: Diagnosis not present

## 2023-12-21 DIAGNOSIS — Z8551 Personal history of malignant neoplasm of bladder: Secondary | ICD-10-CM | POA: Diagnosis not present

## 2023-12-21 DIAGNOSIS — C3411 Malignant neoplasm of upper lobe, right bronchus or lung: Secondary | ICD-10-CM | POA: Diagnosis not present

## 2023-12-21 DIAGNOSIS — Z510 Encounter for antineoplastic radiation therapy: Secondary | ICD-10-CM | POA: Diagnosis not present

## 2023-12-21 DIAGNOSIS — Z5112 Encounter for antineoplastic immunotherapy: Secondary | ICD-10-CM | POA: Diagnosis not present

## 2023-12-21 DIAGNOSIS — Z79620 Long term (current) use of immunosuppressive biologic: Secondary | ICD-10-CM | POA: Diagnosis not present

## 2023-12-21 DIAGNOSIS — Z79630 Long term (current) use of alkylating agent: Secondary | ICD-10-CM | POA: Diagnosis not present

## 2023-12-21 DIAGNOSIS — Z5111 Encounter for antineoplastic chemotherapy: Secondary | ICD-10-CM | POA: Diagnosis not present

## 2023-12-21 DIAGNOSIS — C3412 Malignant neoplasm of upper lobe, left bronchus or lung: Secondary | ICD-10-CM | POA: Diagnosis not present

## 2023-12-21 DIAGNOSIS — Z87891 Personal history of nicotine dependence: Secondary | ICD-10-CM | POA: Diagnosis not present

## 2023-12-21 DIAGNOSIS — C7931 Secondary malignant neoplasm of brain: Secondary | ICD-10-CM | POA: Diagnosis not present

## 2023-12-21 LAB — RAD ONC ARIA SESSION SUMMARY
Course Elapsed Days: 21
Plan Fractions Treated to Date: 7
Plan Prescribed Dose Per Fraction: 2.5 Gy
Plan Total Fractions Prescribed: 10
Plan Total Prescribed Dose: 25 Gy
Reference Point Dosage Given to Date: 17.5 Gy
Reference Point Session Dosage Given: 2.5 Gy
Session Number: 10

## 2023-12-21 NOTE — Progress Notes (Signed)
 [facility] OFFICE PROGRESS NOTE  Practice, [facility] [address] 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  Presented with a large left hilar/mediastinal mass, multiple bilateral pulmonary nodules, prevascular lymph node, and subcutaneous nodules consistent with metastatic disease.  He was diagnosed in January 2025. 3) history of prostate cancer in 2014 4) history of urothelial carcinoma in 2017  PRIOR THERAPY: 1)  palliative course of radiation to the left lung. The last days expected on 09/16/2023. Under the care of Dr. Jeryl Moris. Completed on 09/19/23  2) RUL was treated with a course of stereotactic body radiation treatment by Dr. Jeryl Moris on 12/07/23 to the synchronous stage I NSCLC 3) WBR under the care of Dr. Jeryl Moris, last day on 12/26/23  CURRENT THERAPY: 1) Palliative systemic chemotherapy with carboplatin  for an AUC of 5 on day 1 and etoposide  100 mg/m2 on days 1, 2 and 3, and Imfinzi  1500 mg IV every 3 weeks with neulsta support. First dose on 09/06/23. Status post 5 cycles.   INTERVAL HISTORY: John [name] 78 y.o. male returns to the clinic today for a follow-up visit. The patient establish care in the clinic on 08/25/2023. The patient was found to have extensive stage small cell lung cancer in addition to synchronous early-stage non-small cell lung cancer.  He completed palliative radiation to the left lung under the care of Dr. Jeryl Moris and SBRT to the synchronous stage I non-small cell lung cancer.  He is status post 4 cycles of chemotherapy and immunotherapy and tolerates it well. Starting from his last treatment, he started maintenance treatment with immunotherapy.   He is currently undergoing WBR. He is tolerating this well. He tolerated his He states everything has been spiffy.    He denies any fever or chills.  He reports he  intermittently struggles with night sweats on and off and has been doing so for the last 5 to 6 years which is unchanged. He denies shortness of breath. He denies any significant cough. He denies any hemoptysis or chest pain.  He denies any nausea, vomiting, diarrhea, or significant constipation. He denies any headache or visual changes.  He denies rashes or skin changes. 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 at a level above the muscularis mucosa (per path report)   Frequency of urination 02/11/2021   History of prostate cancer 2014   dx 2013--  Stage T1c,  Gleason 3+3,  PSA 6.38,  vol 25.41cc--  s/p  radioactive seed implants 08-01-2012 urologist-  dr Claretta Croft--  last PSA 0.8  in April 2017   Hydrocele    Hypertension 02/11/2021   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  MEDICATIONS:  Current Outpatient Medications  Medication Sig Dispense Refill   albuterol  (VENTOLIN  HFA) 108 (90 Base) MCG/ACT inhaler Inhale  1-2 puffs into the lungs every 6 (six) hours as needed for wheezing or shortness of breath. 8 g 2   amLODipine (NORVASC) 10 MG tablet Take 10 mg by mouth daily.     COMIRNATY syringe      FLUZONE HIGH-DOSE 0.5 ML injection      lidocaine -prilocaine  (EMLA ) cream Apply 1 Application topically as needed. 30 g 2   memantine  (NAMENDA ) 10 MG tablet Take 1 tablet (10 mg total) by mouth 2 (two) times daily. 60 tablet 4   memantine  (NAMENDA ) 5 MG tablet Begin  this prescription the first day of brain radiation. Week 1: take one tablet po qam. Week 2: take one tablet qam and qpm. Week 3: take two tablets qam, and one tablet po q pm. Week 4: take two tablets qam and qpm. Fill subsequent prescription q month. 70 tablet 0   prochlorperazine  (COMPAZINE ) 10 MG tablet Take 1 tablet (10 mg total) by mouth every 6 (six) hours as needed. 30 tablet 2   No current facility-administered medications for this visit.    SURGICAL HISTORY:  Past Surgical History:  Procedure Laterality Date   ABDOMINAL HERNIA REPAIR  1990's   BRONCHIAL BIOPSY  08/08/2023   Procedure: BRONCHIAL BIOPSIES;  Surgeon: Denson Flake, MD;  Location: [facility] ENDOSCOPY;  Service: Pulmonary;;   BRONCHIAL BRUSHINGS  08/08/2023   Procedure: BRONCHIAL BRUSHINGS;  Surgeon: Denson Flake, MD;  Location: [facility] ENDOSCOPY;  Service: Pulmonary;;   BRONCHIAL NEEDLE ASPIRATION BIOPSY  08/08/2023   Procedure: BRONCHIAL NEEDLE ASPIRATION BIOPSIES;  Surgeon: Denson Flake, MD;  Location: [facility] ENDOSCOPY;  Service: Pulmonary;;   CYSTOSCOPY W/ RETROGRADES N/A 11/03/2015   Procedure: CYSTOSCOPY WITH RETROGRADE PYELOGRAM;  Surgeon: Marco Severs, MD;  Location: [facility];  Service: Urology;  Laterality: N/A;   CYSTOSCOPY W/ RETROGRADES Left 01/09/2016   Procedure: CYSTOSCOPY WITH LEFT RETROGRADE PYELOGRAM;  Surgeon: Marco Severs, MD;  Location: [facility];  Service: Urology;  Laterality: Left;   CYSTOSCOPY W/ RETROGRADES Bilateral 05/28/2016   Procedure: CYSTOSCOPY WITH RETROGRADE PYELOGRAM;  Surgeon: Marco Severs, MD;  Location: [facility];  Service: Urology;  Laterality: Bilateral;   CYSTOSCOPY W/ RETROGRADES Bilateral 02/17/2021   Procedure: CYSTOSCOPY WITH RETROGRADE PYELOGRAM;  Surgeon: Sherlyn Ditto, MD;  Location: [facility];  Service: Urology;  Laterality: Bilateral;   CYSTOSCOPY W/ URETERAL STENT REMOVAL Left 01/09/2016    Procedure: CYSTOSCOPY WITH LEFT STENT EXCHANGE;  Surgeon: Marco Severs, MD;  Location: [facility];  Service: Urology;  Laterality: Left;   CYSTOSCOPY WITH BIOPSY N/A 05/28/2016   Procedure: CYSTOSCOPY WITH BIOPSY;  Surgeon: Marco Severs, MD;  Location: [facility];  Service: Urology;  Laterality: N/A;   CYSTOSCOPY WITH URETHRAL DILATATION N/A 05/28/2016   Procedure: CYSTOSCOPY WITH URETHRAL DILATATION;  Surgeon: Marco Severs, MD;  Location: [facility];  Service: Urology;  Laterality: N/A;   HEMOSTASIS CONTROL  08/08/2023   Procedure: HEMOSTASIS CONTROL;  Surgeon: Denson Flake, MD;  Location: [facility] ENDOSCOPY;  Service: Pulmonary;;   IR IMAGING GUIDED PORT INSERTION  09/19/2023   PROSTATE BIOPSY  05/08/12   Adenocarcinoma   RADIOACTIVE SEED IMPLANT  08/01/2012   Procedure: RADIOACTIVE SEED IMPLANT;  Surgeon: Jinny Mounts, MD;  Location: [facility];  Service: Urology;  Laterality: N/A;  73 seeds implanted no seeds found in bladder   TRANSURETHRAL RESECTION OF BLADDER TUMOR N/A 11/03/2015   Procedure: TRANSURETHRAL  RESECTION OF BLADDER TUMOR (TURBT)WITH INSERTION STENT;  Surgeon: Marco Severs, MD;  Location: [facility];  Service: Urology;  Laterality: N/A;   TRANSURETHRAL RESECTION OF BLADDER TUMOR N/A 02/17/2021   Procedure: TRANSURETHRAL RESECTION OF BLADDER TUMOR (TURBT);  Surgeon: Sherlyn Ditto, MD;  Location: [facility];  Service: Urology;  Laterality: N/A;  30 MINS   TRANSURETHRAL RESECTION OF BLADDER TUMOR WITH GYRUS (TURBT-GYRUS) N/A 01/09/2016   Procedure: TRANSURETHRAL RESECTION OF BLADDER TUMOR WITH GYRUS (TURBT-GYRUS);  Surgeon: Marco Severs, MD;  Location: [facility];  Service: Urology;  Laterality: N/A;   VIDEO BRONCHOSCOPY WITH RADIAL ENDOBRONCHIAL ULTRASOUND  08/08/2023   Procedure: VIDEO BRONCHOSCOPY WITH RADIAL ENDOBRONCHIAL ULTRASOUND;  Surgeon:  Denson Flake, MD;  Location: [facility] ENDOSCOPY;  Service: Pulmonary;;    REVIEW OF SYSTEMS:   Review of Systems  Constitutional: Negative for appetite change, chills, fatigue, fever and unexpected weight change.  HENT: Negative for mouth sores, nosebleeds, sore throat and trouble swallowing.   Eyes: Negative for eye problems and icterus.  Respiratory: Negative for cough, hemoptysis, shortness of breath and wheezing.   Cardiovascular: Negative for chest pain and leg swelling.  Gastrointestinal: Negative for abdominal pain, constipation, diarrhea, nausea and vomiting.  Genitourinary: Negative for bladder incontinence, difficulty urinating, dysuria, frequency and hematuria.   Musculoskeletal: Negative for back pain, gait problem, neck pain and neck stiffness.  Skin: Negative for itching and rash.  Neurological: Negative for dizziness, extremity weakness, gait problem, headaches, light-headedness and seizures.  Hematological: Negative for adenopathy. Does not bruise/bleed easily.  Psychiatric/Behavioral: Negative for confusion, depression and sleep disturbance. The patient is not nervous/anxious.     PHYSICAL EXAMINATION:  There were no vitals taken for this visit.  ECOG PERFORMANCE STATUS: 1  Physical Exam  Constitutional: Oriented to person, place, and time and thin appearing male and in no distress.  HENT:  Head: Normocephalic and atraumatic.  Mouth/Throat: Oropharynx is clear and moist. No oropharyngeal exudate.  Eyes: Conjunctivae are normal. Right eye exhibits no discharge. Left eye exhibits no discharge. No scleral icterus.  Neck: Normal range of motion. Neck supple.  Cardiovascular: Normal rate, regular rhythm, normal heart sounds and intact distal pulses.   Pulmonary/Chest: Effort normal.  Quiet breath sounds in the left lung.  No respiratory distress. No wheezes. No rales.   Abdominal: Soft. Bowel sounds are normal. Exhibits no distension and no mass. There is no tenderness.   Musculoskeletal: Normal range of motion. Exhibits no edema.  Lymphadenopathy:    No cervical adenopathy.  Neurological: Alert and oriented to person, place, and time. Exhibits normal muscle tone. Gait normal. Coordination normal.  Skin: Positive for scattered subcutaneous nodules.  Skin is warm and dry. No rash noted. Not diaphoretic. No erythema. No pallor.  Psychiatric: Mood, memory and judgment normal.  Vitals reviewed.  LABORATORY DATA: Lab Results  Component Value Date   WBC 21.1 (H) 11/28/2023   HGB 10.3 (L) 11/28/2023   HCT 31.6 (L) 11/28/2023   MCV 98.8 11/28/2023   PLT 201 11/28/2023      Chemistry      Component Value Date/Time   NA 139 11/28/2023 1044   K 4.2 11/28/2023 1044   CL 105 11/28/2023 1044   CO2 31 11/28/2023 1044   BUN 10 11/28/2023 1044   CREATININE 0.83 11/28/2023 1044      Component Value Date/Time   CALCIUM 9.4 11/28/2023 1044   ALKPHOS 90 11/28/2023 1044   AST 13 (L) 11/28/2023 1044  ALT 11 11/28/2023 1044   BILITOT 0.3 11/28/2023 1044       RADIOGRAPHIC STUDIES:  MR Brain W Wo Contrast Result Date: 11/27/2023 CLINICAL DATA:  78 year old male with small cell lung cancer, extensive stage. Planning for whole brain radiation. EXAM: MRI HEAD WITHOUT AND WITH CONTRAST TECHNIQUE: Multiplanar, multiecho pulse sequences of the brain and surrounding structures were obtained without and with intravenous contrast. CONTRAST:  7mL GADAVIST  GADOBUTROL  1 MMOL/ML IV SOLN COMPARISON:  Brain MRI 08/19/2023. FINDINGS: Brain: Stable cerebral volume since February. No restricted diffusion to suggest acute infarction. No midline shift, mass effect, evidence of mass lesion, ventriculomegaly, extra-axial collection or acute intracranial hemorrhage. Cervicomedullary junction and pituitary are within normal limits. Stable gray and white matter signal throughout the brain. No abnormal enhancement identified.  No dural thickening. 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 No chronic cerebral blood products on SWI. Vascular: Major intracranial vascular flow voids are preserved. Following contrast the major dural venous sinuses are enhancing and appear to be patent. Skull and upper cervical spine: Negative for age visible cervical spine. Visualized bone marrow signal is within normal limits. Negative visible spinal cord. Sinuses/Orbits: Stable and negative. Other: Mastoids are clear. Visible internal auditory structures appear normal. Negative visible scalp and face. IMPRESSION: No metastatic disease or acute intracranial abnormality. Stable minimal/mild for age chronic small vessel disease. Electronically Signed   By: Marlise Simpers M.D.   On: 11/27/2023 03:53   CT CHEST ABDOMEN PELVIS W CONTRAST Result Date: 11/25/2023 CLINICAL DATA:  Small cell lung cancer. Assess for treatment response. EXAM: CT CHEST, ABDOMEN, AND PELVIS WITH CONTRAST TECHNIQUE: Multidetector CT imaging of the chest, abdomen and pelvis was performed following the standard protocol during bolus administration of intravenous contrast. RADIATION DOSE REDUCTION: This exam was performed according to the departmental dose-optimization program which includes automated exposure control, adjustment of the mA and/or kV according to patient size and/or use of iterative reconstruction technique. CONTRAST:  OMNIPAQUE  IOHEXOL  300 MG/ML  SOLN COMPARISON:  CT dated 10/12/2023. FINDINGS: CT CHEST FINDINGS Cardiovascular: There is no cardiomegaly. Trace pericardial effusion. Mild atherosclerotic calcification of the thoracic aorta. No aneurysmal dilatation or dissection. The origins of the great vessels of the aortic arch and the central pulmonary arteries are patent. Mediastinum/Nodes: No hilar adenopathy. Infiltrative tissue in the cardiopulmonary window measures approximately 5.3 x 3.3 cm and slightly decreased since the prior CT. The esophagus is grossly  unremarkable. No mediastinal fluid collection. Lungs/Pleura: Background of emphysema. Decrease in the size of the nodule in the posterior right upper lobe since the prior CT. No new consolidation. There is no pleural effusion or pneumothorax. The central airways are patent. Musculoskeletal: Similar sized nodular density in the subcutaneous soft tissues of the right anterior chest wall. No acute osseous pathology. CT ABDOMEN PELVIS FINDINGS No intra-abdominal free air or free fluid. Hepatobiliary: The liver is unremarkable. No biliary dilatation. The gallbladder is contracted. Pancreas: Unremarkable. No pancreatic ductal dilatation or surrounding inflammatory changes. Spleen: Normal in size without focal abnormality. Adrenals/Urinary Tract: The adrenal glands unremarkable. Bilateral renal cysts. There is no hydronephrosis on either side. There is symmetric enhancement and excretion of contrast by both kidneys. The visualized ureters and urinary bladder appear unremarkable. Stomach/Bowel: There is moderate stool throughout the colon. There is no bowel obstruction or active inflammation. The appendix is normal. Vascular/Lymphatic: Moderate aortoiliac atherosclerotic disease. The IVC is unremarkable. No portal venous gas. There is no adenopathy.  Reproductive: Prostate brachytherapy seeds. Other: None Musculoskeletal: Degenerative changes of the spine. No acute osseous pathology. IMPRESSION: 1. Decrease in the size of the nodule in the posterior right upper lobe since the prior CT. 2. Slight decrease in the size of the infiltrative tissue in the cardiopulmonary window. 3. No evidence of metastatic disease in the abdomen or pelvis. 4. Aortic Atherosclerosis (ICD10-I70.0) and Emphysema (ICD10-J43.9). Electronically Signed   By: Angus Bark M.D.   On: 11/25/2023 14:20     ASSESSMENT/PLAN:  This is a very pleasant 78 year old African American male diagnosed with: 1) stage 1 syncronous right upper lobe NSCLC,  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  Presented with a large left hilar/mediastinal mass, multiple bilateral pulmonary nodules, prevascular lymph node, and subcutaneous nodules consistent with metastatic disease.  He was diagnosed in January 2025. 3) history of prostate cancer in 2014 4) history of urothelial carcinoma in 2017   The patient is going to start a 3-week palliative course of radiation to the left lung.  The last days on 09/19/2023.  This will be given under the care of Dr. Jeryl Moris.  He underwent SBRT to the synchronous stage I NSCLC.     Given the small cell lung cancer in the left lung, Dr. Marguerita Shih recommended chemotherapy with carboplatin  for an AUC of 5 on day 1 and etoposide  100 mg/m2 on days 1, 2 and 3, and Imfinzi  1500 mg IV every 3 weeks with neulsta support. He started this on 09/06/23. He is status post 5 cycle and tolerated it well without any appreciable adverse side effects.   He is getting WBR. His last day is today. The patient is doing very well. However, I will defer his treatment by 1 week to allow him to finish WBR to avoid any unwanted neuro-toxicities from [facility] with immunotherapy.   I have deferred his treatment plan by 1 week. He does not need to see me or Dr. Marguerita Shih next week prior to treatment.   We will see him in 5 weeks with cycle #7.    The patient was advised to call immediately if he has any concerning symptoms in the interval. The patient voices understanding of current disease status and treatment options and is in agreement with the current care plan. All questions were answered. The patient knows to call the clinic with any problems, questions or concerns. We can certainly see the patient much sooner if necessary     No orders of the defined types were placed in this encounter.   The total time spent in the appointment was 20-29 minutes  Caydence Enck L Raenah Murley, PA-C 12/21/23

## 2023-12-22 ENCOUNTER — Other Ambulatory Visit: Payer: Self-pay

## 2023-12-22 ENCOUNTER — Ambulatory Visit
Admission: RE | Admit: 2023-12-22 | Discharge: 2023-12-22 | Disposition: A | Discharge status: Home / Self Care | Source: Ambulatory Visit | Attending: Radiation Oncology | Admitting: Radiation Oncology

## 2023-12-22 DIAGNOSIS — Z79620 Long term (current) use of immunosuppressive biologic: Secondary | ICD-10-CM | POA: Diagnosis not present

## 2023-12-22 DIAGNOSIS — Z79630 Long term (current) use of alkylating agent: Secondary | ICD-10-CM | POA: Diagnosis not present

## 2023-12-22 DIAGNOSIS — C7931 Secondary malignant neoplasm of brain: Secondary | ICD-10-CM | POA: Diagnosis not present

## 2023-12-22 DIAGNOSIS — Z79634 Long term (current) use of topoisomerase inhibitor: Secondary | ICD-10-CM | POA: Diagnosis not present

## 2023-12-22 DIAGNOSIS — Z5112 Encounter for antineoplastic immunotherapy: Secondary | ICD-10-CM | POA: Diagnosis not present

## 2023-12-22 DIAGNOSIS — Z5111 Encounter for antineoplastic chemotherapy: Secondary | ICD-10-CM | POA: Diagnosis not present

## 2023-12-22 DIAGNOSIS — Z8551 Personal history of malignant neoplasm of bladder: Secondary | ICD-10-CM | POA: Diagnosis not present

## 2023-12-22 DIAGNOSIS — C3411 Malignant neoplasm of upper lobe, right bronchus or lung: Secondary | ICD-10-CM | POA: Diagnosis not present

## 2023-12-22 DIAGNOSIS — Z87891 Personal history of nicotine dependence: Secondary | ICD-10-CM | POA: Diagnosis not present

## 2023-12-22 DIAGNOSIS — C3412 Malignant neoplasm of upper lobe, left bronchus or lung: Secondary | ICD-10-CM | POA: Diagnosis not present

## 2023-12-22 DIAGNOSIS — Z510 Encounter for antineoplastic radiation therapy: Secondary | ICD-10-CM | POA: Diagnosis not present

## 2023-12-22 LAB — RAD ONC ARIA SESSION SUMMARY
Course Elapsed Days: 22
Plan Fractions Treated to Date: 8
Plan Prescribed Dose Per Fraction: 2.5 Gy
Plan Total Fractions Prescribed: 10
Plan Total Prescribed Dose: 25 Gy
Reference Point Dosage Given to Date: 20 Gy
Reference Point Session Dosage Given: 2.5 Gy
Session Number: 11

## 2023-12-23 ENCOUNTER — Ambulatory Visit
Admission: RE | Admit: 2023-12-23 | Discharge: 2023-12-23 | Disposition: A | Discharge status: Home / Self Care | Source: Ambulatory Visit | Attending: Radiation Oncology | Admitting: Radiation Oncology

## 2023-12-23 ENCOUNTER — Ambulatory Visit
Admission: RE | Admit: 2023-12-23 | Discharge: 2023-12-23 | Disposition: A | Discharge status: Home / Self Care | Source: Ambulatory Visit | Attending: Radiation Oncology

## 2023-12-23 ENCOUNTER — Other Ambulatory Visit: Payer: Self-pay

## 2023-12-23 DIAGNOSIS — Z510 Encounter for antineoplastic radiation therapy: Secondary | ICD-10-CM | POA: Diagnosis not present

## 2023-12-23 DIAGNOSIS — Z5112 Encounter for antineoplastic immunotherapy: Secondary | ICD-10-CM | POA: Diagnosis not present

## 2023-12-23 DIAGNOSIS — Z79630 Long term (current) use of alkylating agent: Secondary | ICD-10-CM | POA: Diagnosis not present

## 2023-12-23 DIAGNOSIS — Z87891 Personal history of nicotine dependence: Secondary | ICD-10-CM | POA: Diagnosis not present

## 2023-12-23 DIAGNOSIS — C3411 Malignant neoplasm of upper lobe, right bronchus or lung: Secondary | ICD-10-CM | POA: Diagnosis not present

## 2023-12-23 DIAGNOSIS — Z8551 Personal history of malignant neoplasm of bladder: Secondary | ICD-10-CM | POA: Diagnosis not present

## 2023-12-23 DIAGNOSIS — Z79620 Long term (current) use of immunosuppressive biologic: Secondary | ICD-10-CM | POA: Diagnosis not present

## 2023-12-23 DIAGNOSIS — Z79634 Long term (current) use of topoisomerase inhibitor: Secondary | ICD-10-CM | POA: Diagnosis not present

## 2023-12-23 DIAGNOSIS — Z5111 Encounter for antineoplastic chemotherapy: Secondary | ICD-10-CM | POA: Diagnosis not present

## 2023-12-23 DIAGNOSIS — C7931 Secondary malignant neoplasm of brain: Secondary | ICD-10-CM | POA: Diagnosis not present

## 2023-12-23 DIAGNOSIS — C3412 Malignant neoplasm of upper lobe, left bronchus or lung: Secondary | ICD-10-CM | POA: Diagnosis not present

## 2023-12-23 LAB — RAD ONC ARIA SESSION SUMMARY
Course Elapsed Days: 23
Plan Fractions Treated to Date: 9
Plan Prescribed Dose Per Fraction: 2.5 Gy
Plan Total Fractions Prescribed: 10
Plan Total Prescribed Dose: 25 Gy
Reference Point Dosage Given to Date: 22.5 Gy
Reference Point Session Dosage Given: 2.5 Gy
Session Number: 12

## 2023-12-26 ENCOUNTER — Inpatient Hospital Stay

## 2023-12-26 ENCOUNTER — Inpatient Hospital Stay: Attending: Internal Medicine | Admitting: Physician Assistant

## 2023-12-26 ENCOUNTER — Ambulatory Visit
Admission: RE | Admit: 2023-12-26 | Discharge: 2023-12-26 | Disposition: A | Discharge status: Home / Self Care | Source: Ambulatory Visit | Attending: Radiation Oncology

## 2023-12-26 ENCOUNTER — Other Ambulatory Visit: Payer: Self-pay

## 2023-12-26 VITALS — BP 140/76 | HR 85 | Temp 98.1°F | Resp 17 | Ht 72.0 in | Wt 154.3 lb

## 2023-12-26 DIAGNOSIS — Z510 Encounter for antineoplastic radiation therapy: Secondary | ICD-10-CM | POA: Diagnosis not present

## 2023-12-26 DIAGNOSIS — Z8546 Personal history of malignant neoplasm of prostate: Secondary | ICD-10-CM | POA: Insufficient documentation

## 2023-12-26 DIAGNOSIS — C3411 Malignant neoplasm of upper lobe, right bronchus or lung: Secondary | ICD-10-CM | POA: Insufficient documentation

## 2023-12-26 DIAGNOSIS — Z8551 Personal history of malignant neoplasm of bladder: Secondary | ICD-10-CM | POA: Insufficient documentation

## 2023-12-26 DIAGNOSIS — Z79620 Long term (current) use of immunosuppressive biologic: Secondary | ICD-10-CM | POA: Diagnosis not present

## 2023-12-26 DIAGNOSIS — C3412 Malignant neoplasm of upper lobe, left bronchus or lung: Secondary | ICD-10-CM | POA: Diagnosis not present

## 2023-12-26 DIAGNOSIS — R591 Generalized enlarged lymph nodes: Secondary | ICD-10-CM | POA: Insufficient documentation

## 2023-12-26 DIAGNOSIS — Z87891 Personal history of nicotine dependence: Secondary | ICD-10-CM | POA: Diagnosis not present

## 2023-12-26 DIAGNOSIS — Z5112 Encounter for antineoplastic immunotherapy: Secondary | ICD-10-CM | POA: Diagnosis not present

## 2023-12-26 DIAGNOSIS — C7931 Secondary malignant neoplasm of brain: Secondary | ICD-10-CM | POA: Diagnosis not present

## 2023-12-26 DIAGNOSIS — C3402 Malignant neoplasm of left main bronchus: Secondary | ICD-10-CM

## 2023-12-26 DIAGNOSIS — Z79634 Long term (current) use of topoisomerase inhibitor: Secondary | ICD-10-CM | POA: Diagnosis not present

## 2023-12-26 DIAGNOSIS — Z95828 Presence of other vascular implants and grafts: Secondary | ICD-10-CM

## 2023-12-26 DIAGNOSIS — Z923 Personal history of irradiation: Secondary | ICD-10-CM | POA: Insufficient documentation

## 2023-12-26 DIAGNOSIS — Z5111 Encounter for antineoplastic chemotherapy: Secondary | ICD-10-CM | POA: Diagnosis not present

## 2023-12-26 DIAGNOSIS — Z79630 Long term (current) use of alkylating agent: Secondary | ICD-10-CM | POA: Diagnosis not present

## 2023-12-26 LAB — CMP (CANCER CENTER ONLY)
ALT: 9 U/L (ref 0–44)
AST: 16 U/L (ref 15–41)
Albumin: 3.7 g/dL (ref 3.5–5.0)
Alkaline Phosphatase: 65 U/L (ref 38–126)
Anion gap: 5 (ref 5–15)
BUN: 20 mg/dL (ref 8–23)
CO2: 31 mmol/L (ref 22–32)
Calcium: 9.8 mg/dL (ref 8.9–10.3)
Chloride: 105 mmol/L (ref 98–111)
Creatinine: 0.93 mg/dL (ref 0.61–1.24)
GFR, Estimated: >60 mL/min (ref >60)
Glucose, Bld: 126 mg/dL — ABNORMAL HIGH (ref 70–99)
Potassium: 4.4 mmol/L (ref 3.5–5.1)
Sodium: 141 mmol/L (ref 135–145)
Total Bilirubin: 0.4 mg/dL (ref 0.0–1.2)
Total Protein: 7.2 g/dL (ref 6.5–8.1)

## 2023-12-26 LAB — CBC WITH DIFFERENTIAL (CANCER CENTER ONLY)
Abs Immature Granulocytes: 0.05 10*3/uL (ref 0.00–0.07)
Basophils Absolute: 0 10*3/uL (ref 0.0–0.1)
Basophils Relative: 0 %
Eosinophils Absolute: 0.6 10*3/uL — ABNORMAL HIGH (ref 0.0–0.5)
Eosinophils Relative: 6 %
HCT: 37.7 % — ABNORMAL LOW (ref 39.0–52.0)
Hemoglobin: 12.3 g/dL — ABNORMAL LOW (ref 13.0–17.0)
Immature Granulocytes: 1 %
Lymphocytes Relative: 4 %
Lymphs Abs: 0.5 10*3/uL — ABNORMAL LOW (ref 0.7–4.0)
MCH: 31.3 pg (ref 26.0–34.0)
MCHC: 32.6 g/dL (ref 30.0–36.0)
MCV: 95.9 fL (ref 80.0–100.0)
Monocytes Absolute: 1.2 10*3/uL — ABNORMAL HIGH (ref 0.1–1.0)
Monocytes Relative: 11 %
Neutro Abs: 8.4 10*3/uL — ABNORMAL HIGH (ref 1.7–7.7)
Neutrophils Relative %: 78 %
Platelet Count: 187 10*3/uL (ref 150–400)
RBC: 3.93 MIL/uL — ABNORMAL LOW (ref 4.22–5.81)
RDW: 13.6 % (ref 11.5–15.5)
WBC Count: 10.8 10*3/uL — ABNORMAL HIGH (ref 4.0–10.5)
nRBC: 0 % (ref 0.0–0.2)

## 2023-12-26 LAB — RAD ONC ARIA SESSION SUMMARY
Course Elapsed Days: 26
Plan Fractions Treated to Date: 10
Plan Prescribed Dose Per Fraction: 2.5 Gy
Plan Total Fractions Prescribed: 10
Plan Total Prescribed Dose: 25 Gy
Reference Point Dosage Given to Date: 25 Gy
Reference Point Session Dosage Given: 2.5 Gy
Session Number: 13

## 2023-12-26 LAB — SAMPLE TO BLOOD BANK

## 2023-12-26 LAB — TSH: TSH: 0.338 u[IU]/mL — ABNORMAL LOW (ref 0.350–4.500)

## 2023-12-26 MED ORDER — SODIUM CHLORIDE 0.9% FLUSH
10.0000 mL | Freq: Once | INTRAVENOUS | Status: AC
  Administered 2023-12-26: 10 mL

## 2023-12-27 LAB — T4: T4, Total: 8.5 ug/dL (ref 4.5–12.0)

## 2023-12-27 NOTE — Radiation Completion Notes (Addendum)
  Radiation Oncology         [phone] ________________________________  Name: DAYSON ABOUD MRN: [id number]  Date of Service: 12/26/2023  DOB: 04-29-1946  End of Treatment Note   Diagnosis: Extensive Stage Small Cell Carcinoma of the Left lung and synchronous Stage IA3, cT1cN0M0, NSCLC, adenocarcinoma of the RUL   Intent: Palliative     ==========DELIVERED PLANS==========  First Treatment Date: 2023-12-13 Last Treatment Date: 2023-12-26     Plan Name: Brain_PCI Site: Brain Technique: IMRT Mode: Photon Dose Per Fraction: 2.5 Gy Prescribed Dose (Delivered / Prescribed): 25 Gy / 25 Gy Prescribed Fxs (Delivered / Prescribed): 10 / 10     ==========ON TREATMENT VISIT DATES==========  2023-12-16, 2023-12-23    See weekly On Treatment Notes in Epic for details in the Media tab (listed as Progress notes on the On Treatment Visit Dates listed above). The patient tolerated radiation.  The patient will receive a call in about one month from the radiation oncology department. He will continue follow up with Dr. Marguerita Shih as well as be followed in surveillance in 3 months with an MRI brain, and complete Namenda  in the coming months as previously discussed to reduce long term cognitive deficits from PCI whole brain radiation.    Shelvia Dick, PAC

## 2023-12-28 ENCOUNTER — Other Ambulatory Visit: Payer: Self-pay

## 2023-12-28 ENCOUNTER — Other Ambulatory Visit: Payer: Self-pay | Admitting: Radiation Oncology

## 2023-12-29 ENCOUNTER — Telehealth: Payer: Self-pay | Admitting: *Deleted

## 2023-12-29 NOTE — Telephone Encounter (Signed)
 Patient called for recommendations on constipation relief.  He states he has taken a few doses of miralax without relief.  He reports his last bowel movement was 2 days ago.  He reports he has not been eating as much as he normally does but feels he is getting adequate nutrition.  He was encouraged to drink plenty of fluids.  I let him know that he could try some warm prune juice with milk of magnesia or magnesium citrate.  He was encouraged to continue the miralax 1-2 times a day until his bowels start moving regularly and then cut back to once daily or every other day.  He verbalized understanding.  He was encouraged to give us  a call with any other questions or concerns.  Christianne Cowper RN, BSN

## 2023-12-30 ENCOUNTER — Other Ambulatory Visit: Payer: Self-pay | Admitting: Radiation Oncology

## 2024-01-02 ENCOUNTER — Inpatient Hospital Stay

## 2024-01-02 ENCOUNTER — Encounter: Payer: Self-pay | Admitting: Internal Medicine

## 2024-01-02 VITALS — BP 122/57 | HR 90 | Temp 98.3°F | Resp 18 | Ht 72.0 in | Wt 154.0 lb

## 2024-01-02 DIAGNOSIS — Z8546 Personal history of malignant neoplasm of prostate: Secondary | ICD-10-CM | POA: Insufficient documentation

## 2024-01-02 DIAGNOSIS — Z79620 Long term (current) use of immunosuppressive biologic: Secondary | ICD-10-CM | POA: Diagnosis not present

## 2024-01-02 DIAGNOSIS — C3411 Malignant neoplasm of upper lobe, right bronchus or lung: Secondary | ICD-10-CM | POA: Diagnosis not present

## 2024-01-02 DIAGNOSIS — R591 Generalized enlarged lymph nodes: Secondary | ICD-10-CM | POA: Diagnosis not present

## 2024-01-02 DIAGNOSIS — Z923 Personal history of irradiation: Secondary | ICD-10-CM | POA: Insufficient documentation

## 2024-01-02 DIAGNOSIS — Z8551 Personal history of malignant neoplasm of bladder: Secondary | ICD-10-CM | POA: Diagnosis not present

## 2024-01-02 DIAGNOSIS — C3402 Malignant neoplasm of left main bronchus: Secondary | ICD-10-CM

## 2024-01-02 DIAGNOSIS — C3412 Malignant neoplasm of upper lobe, left bronchus or lung: Secondary | ICD-10-CM

## 2024-01-02 DIAGNOSIS — Z5112 Encounter for antineoplastic immunotherapy: Secondary | ICD-10-CM | POA: Diagnosis not present

## 2024-01-02 DIAGNOSIS — Z95828 Presence of other vascular implants and grafts: Secondary | ICD-10-CM

## 2024-01-02 LAB — CBC WITH DIFFERENTIAL (CANCER CENTER ONLY)
Abs Immature Granulocytes: 0.02 10*3/uL (ref 0.00–0.07)
Basophils Absolute: 0 10*3/uL (ref 0.0–0.1)
Basophils Relative: 0 %
Eosinophils Absolute: 0.6 10*3/uL — ABNORMAL HIGH (ref 0.0–0.5)
Eosinophils Relative: 6 %
HCT: 39.1 % (ref 39.0–52.0)
Hemoglobin: 12.5 g/dL — ABNORMAL LOW (ref 13.0–17.0)
Immature Granulocytes: 0 %
Lymphocytes Relative: 6 %
Lymphs Abs: 0.6 10*3/uL — ABNORMAL LOW (ref 0.7–4.0)
MCH: 30.4 pg (ref 26.0–34.0)
MCHC: 32 g/dL (ref 30.0–36.0)
MCV: 95.1 fL (ref 80.0–100.0)
Monocytes Absolute: 0.8 10*3/uL (ref 0.1–1.0)
Monocytes Relative: 8 %
Neutro Abs: 8.1 10*3/uL — ABNORMAL HIGH (ref 1.7–7.7)
Neutrophils Relative %: 80 %
Platelet Count: 167 10*3/uL (ref 150–400)
RBC: 4.11 MIL/uL — ABNORMAL LOW (ref 4.22–5.81)
RDW: 13.2 % (ref 11.5–15.5)
WBC Count: 10.2 10*3/uL (ref 4.0–10.5)
nRBC: 0 % (ref 0.0–0.2)

## 2024-01-02 LAB — SAMPLE TO BLOOD BANK

## 2024-01-02 LAB — CMP (CANCER CENTER ONLY)
ALT: 29 U/L (ref 0–44)
AST: 29 U/L (ref 15–41)
Albumin: 3.7 g/dL (ref 3.5–5.0)
Alkaline Phosphatase: 74 U/L (ref 38–126)
Anion gap: 8 (ref 5–15)
BUN: 17 mg/dL (ref 8–23)
CO2: 29 mmol/L (ref 22–32)
Calcium: 9.9 mg/dL (ref 8.9–10.3)
Chloride: 103 mmol/L (ref 98–111)
Creatinine: 1.07 mg/dL (ref 0.61–1.24)
GFR, Estimated: >60 mL/min (ref >60)
Glucose, Bld: 172 mg/dL — ABNORMAL HIGH (ref 70–99)
Potassium: 4 mmol/L (ref 3.5–5.1)
Sodium: 140 mmol/L (ref 135–145)
Total Bilirubin: 0.3 mg/dL (ref 0.0–1.2)
Total Protein: 7.1 g/dL (ref 6.5–8.1)

## 2024-01-02 LAB — TSH: TSH: 0.677 u[IU]/mL (ref 0.350–4.500)

## 2024-01-02 MED ORDER — SODIUM CHLORIDE 0.9% FLUSH: 10.0000 mL | INTRAVENOUS | Status: DC | PRN

## 2024-01-02 MED ORDER — SODIUM CHLORIDE 0.9 % IV SOLN: INTRAVENOUS | Status: DC

## 2024-01-02 MED ORDER — HEPARIN SOD (PORK) LOCK FLUSH 100 UNIT/ML IV SOLN: 500.0000 [IU] | Freq: Once | INTRAVENOUS | Status: DC | PRN

## 2024-01-02 MED ORDER — SODIUM CHLORIDE 0.9 % IV SOLN
1500.0000 mg | Freq: Once | INTRAVENOUS | Status: AC
  Administered 2024-01-02: 1500 mg via INTRAVENOUS
  Filled 2024-01-02: qty 30

## 2024-01-02 MED ORDER — SODIUM CHLORIDE 0.9% FLUSH
10.0000 mL | Freq: Once | INTRAVENOUS | Status: AC
  Administered 2024-01-02: 10 mL

## 2024-01-02 NOTE — Patient Instructions (Addendum)
 [facility] - A DEPT OF [facility]    Per Cassie PA pt can take both a laxative and stool softener to assist with constipation. Since you are currently constipated take a laxative and stool softener until you achieve a bowel movement. You should also be active, eat fruits and vegetable, and drink water . If constipation persists you may want to take stool softeners daily to keep you regulated.  Discharge Instructions: Thank you for choosing [facility] to provide your oncology and hematology care.   If you have a lab appointment with the Cancer Center, please go directly to the Cancer Center and check in at the registration area.   Wear comfortable clothing and clothing appropriate for easy access to any Portacath or PICC line.   We strive to give you quality time with your provider. You may need to reschedule your appointment if you arrive late (15 or more minutes).  Arriving late affects you and other patients whose appointments are after yours.  Also, if you miss three or more appointments without notifying the office, you may be dismissed from the clinic at the provider's discretion.      For prescription refill requests, have your pharmacy contact our office and allow 72 hours for refills to be completed.    Today you received the following chemotherapy and/or immunotherapy agents: Durvalumab  (Imfinzi )   To help prevent nausea and vomiting after your treatment, we encourage you to take your nausea medication as directed.  BELOW ARE SYMPTOMS THAT SHOULD BE REPORTED IMMEDIATELY: *FEVER GREATER THAN 100.4 F (38 C) OR HIGHER *CHILLS OR SWEATING *NAUSEA AND VOMITING THAT IS NOT CONTROLLED WITH YOUR NAUSEA MEDICATION *UNUSUAL SHORTNESS OF BREATH *UNUSUAL BRUISING OR BLEEDING *URINARY PROBLEMS (pain or burning when urinating, or frequent urination) *BOWEL PROBLEMS (unusual diarrhea, constipation, pain near the anus) TENDERNESS IN MOUTH AND  THROAT WITH OR WITHOUT PRESENCE OF ULCERS (sore throat, sores in mouth, or a toothache) UNUSUAL RASH, SWELLING OR PAIN  UNUSUAL VAGINAL DISCHARGE OR ITCHING   Items with * indicate a potential emergency and should be followed up as soon as possible or go to the Emergency Department if any problems should occur.  Please show the CHEMOTHERAPY ALERT CARD or IMMUNOTHERAPY ALERT CARD at check-in to the Emergency Department and triage nurse.  Should you have questions after your visit or need to cancel or reschedule your appointment, please contact [facility] - A DEPT OF [facility]  Dept: [phone]  and follow the prompts.  Office hours are 8:00 a.m. to 4:30 p.m. Monday - Friday. Please note that voicemails left after 4:00 p.m. may not be returned until the following business day.  We are closed weekends and major holidays. You have access to a nurse at all times for urgent questions. Please call the main number to the clinic Dept: [phone] and follow the prompts.   For any non-urgent questions, you may also contact your provider using MyChart. We now offer e-Visits for anyone 13 and older to request care online for non-urgent symptoms. For details visit [email or web address].   Also download the MyChart app! Go to the app store, search MyChart, open the app, select [place], and log in with your MyChart username and password.

## 2024-01-03 LAB — T4: T4, Total: 8.1 ug/dL (ref 4.5–12.0)

## 2024-01-09 ENCOUNTER — Telehealth: Payer: Self-pay

## 2024-01-09 NOTE — Telephone Encounter (Signed)
 RN returned call to [name] had complaints not feeling well.  Reports weakness, nausea & vomiting x 2 started earlier this morning around 3 am.  Reports not eating much so not much came out mostly liquids which is all he's taking in at this time.  RN advised patient to take nausea /vomiting medication and to increase fluid intake ( Gatorade, water , or ginger)  Eat something light for example chicken/beef broth with some dry crackers and rest.  RN advised if symptoms should worsen to go to ED for evaluation and IV fluids.  [name] verbalized understanding and ended call.

## 2024-01-18 NOTE — Progress Notes (Addendum)
  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  The patient did not note fatigue during radiation. The patient did not note hair loss or skin changes in the field of radiation during therapy. The patient is not taking dexamethasone . The patient does not have symptoms of  weakness or loss of control of the extremities. The patient does not have symptoms of headache. The patient does not have symptoms of seizure or uncontrolled movement. The patient does not have symptoms of changes in vision. The patient does not have changes in speech. The patient does not have confusion.   The patient was counseled that he  will be contacted by our brain and spine navigator to schedule surveillance imaging. The patient was encouraged to call if he  have not received a call to schedule imaging, or if he develops concerns or questions regarding radiation. The patient will also continue to follow up with Dr. Sherrod in medical oncology.  This concludes the interaction.  Rosaline Minerva, LPN

## 2024-01-23 ENCOUNTER — Ambulatory Visit
Admission: RE | Admit: 2024-01-23 | Discharge: 2024-01-23 | Disposition: A | Discharge status: Home / Self Care | Source: Ambulatory Visit | Attending: Physician Assistant | Admitting: Physician Assistant

## 2024-01-23 DIAGNOSIS — Z79634 Long term (current) use of topoisomerase inhibitor: Secondary | ICD-10-CM | POA: Insufficient documentation

## 2024-01-23 DIAGNOSIS — Z5112 Encounter for antineoplastic immunotherapy: Secondary | ICD-10-CM | POA: Insufficient documentation

## 2024-01-23 DIAGNOSIS — C3412 Malignant neoplasm of upper lobe, left bronchus or lung: Secondary | ICD-10-CM | POA: Insufficient documentation

## 2024-01-23 DIAGNOSIS — C3411 Malignant neoplasm of upper lobe, right bronchus or lung: Secondary | ICD-10-CM | POA: Insufficient documentation

## 2024-01-23 DIAGNOSIS — C61 Malignant neoplasm of prostate: Secondary | ICD-10-CM | POA: Insufficient documentation

## 2024-01-23 DIAGNOSIS — Z5111 Encounter for antineoplastic chemotherapy: Secondary | ICD-10-CM | POA: Insufficient documentation

## 2024-01-23 DIAGNOSIS — Z8546 Personal history of malignant neoplasm of prostate: Secondary | ICD-10-CM | POA: Insufficient documentation

## 2024-01-23 DIAGNOSIS — Z87891 Personal history of nicotine dependence: Secondary | ICD-10-CM | POA: Insufficient documentation

## 2024-01-23 DIAGNOSIS — Z79630 Long term (current) use of alkylating agent: Secondary | ICD-10-CM | POA: Insufficient documentation

## 2024-01-23 DIAGNOSIS — Z8551 Personal history of malignant neoplasm of bladder: Secondary | ICD-10-CM | POA: Insufficient documentation

## 2024-01-23 DIAGNOSIS — Z510 Encounter for antineoplastic radiation therapy: Secondary | ICD-10-CM | POA: Insufficient documentation

## 2024-01-23 DIAGNOSIS — Z79620 Long term (current) use of immunosuppressive biologic: Secondary | ICD-10-CM | POA: Insufficient documentation

## 2024-01-24 ENCOUNTER — Other Ambulatory Visit: Payer: Self-pay | Admitting: Internal Medicine

## 2024-01-24 DIAGNOSIS — C3412 Malignant neoplasm of upper lobe, left bronchus or lung: Secondary | ICD-10-CM

## 2024-01-24 NOTE — Progress Notes (Signed)
 [facility] OFFICE PROGRESS NOTE  Practice, [facility] [address] [place] [address]  DIAGNOSIS:  1) 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  Presented with a large left hilar/mediastinal mass, multiple bilateral pulmonary nodules, prevascular lymph node, and subcutaneous nodules consistent with metastatic disease.  He was diagnosed in January 2025. 3) history of prostate cancer in 2014 4) history of urothelial carcinoma in 2017 5) WBR under the care of Dr. Dewey, last day on 12/26/23  PRIOR THERAPY: 1)  palliative course of radiation to the left lung. The last days expected on 09/16/2023. Under the care of Dr. Dewey. Completed on 09/19/23  2) RUL was treated with a course of stereotactic body radiation treatment by Dr. Dewey on 12/07/23 to the synchronous stage I NSCLC  CURRENT THERAPY: 1) Palliative systemic chemotherapy with carboplatin  for an AUC of 5 on day 1 and etoposide  100 mg/m2 on days 1, 2 and 3, and Imfinzi  1500 mg IV every 3 weeks with neulsta support. First dose on 09/06/23. Status post 6 cycles.   INTERVAL HISTORY: John [name] 78 y.o. male returns to the clinic today for a follow-up visit. The patient establish care in the clinic on 08/25/2023. The patient was found to have extensive stage small cell lung cancer in addition to synchronous early-stage non-small cell lung cancer.  He completed palliative radiation to the left lung under the care of Dr. Dewey and SBRT to the synchronous stage I non-small cell lung cancer.  He is status post 4 cycles of chemotherapy and immunotherapy and tolerates it well. Starting from his last treatment, he started maintenance treatment with immunotherapy.   He denies any fever or chills. He reports he intermittently struggles with night sweats on and off and has been doing so for the last 5 to 6 years which is unchanged. He  denies shortness of breath. He denies any significant cough. He denies any hemoptysis or chest pain. He denies any nausea, vomiting, diarrhea, or significant constipation. He denies any headache or visual changes.  He denies rashes or skin changes. He reports similar subcutaneous nodules. He is here today for evaluation before undergoing cycle #7.      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  MEDICATIONS:  Current Outpatient Medications  Medication Sig Dispense Refill   albuterol  (VENTOLIN  HFA) 108 (90 Base) MCG/ACT inhaler Inhale 1-2 puffs into the lungs every 6 (six) hours as needed for wheezing or shortness of  breath. 8 g 2   amLODipine (NORVASC) 10 MG tablet Take 10 mg by mouth daily.     COMIRNATY syringe      FLUZONE HIGH-DOSE 0.5 ML injection      lidocaine -prilocaine  (EMLA ) cream Apply 1 Application topically as needed. (Patient not taking: Reported on 12/26/2023) 30 g 2   memantine  (NAMENDA ) 10 MG tablet Take 1 tablet (10 mg total) by mouth 2 (two) times daily. 60 tablet 4   memantine  (NAMENDA ) 5 MG tablet Begin this prescription the first day of  brain radiation. Week 1: take one tablet po qam. Week 2: take one tablet qam and qpm. Week 3: take two tablets qam, and one tablet po q pm. Week 4: take two tablets qam and qpm. Fill subsequent prescription q month. 70 tablet 0   prochlorperazine  (COMPAZINE ) 10 MG tablet Take 1 tablet (10 mg total) by mouth every 6 (six) hours as needed. 30 tablet 2   No current facility-administered medications for this visit.    SURGICAL HISTORY:  Past Surgical History:  Procedure Laterality Date   ABDOMINAL HERNIA REPAIR  1990's   BRONCHIAL BIOPSY  08/08/2023   Procedure: BRONCHIAL BIOPSIES;  Surgeon: Shelah Lamar RAMAN, MD;  Location: [facility] ENDOSCOPY;  Service: Pulmonary;;   BRONCHIAL BRUSHINGS  08/08/2023   Procedure: BRONCHIAL BRUSHINGS;  Surgeon: Shelah Lamar RAMAN, MD;  Location: [facility] ENDOSCOPY;  Service: Pulmonary;;   BRONCHIAL NEEDLE ASPIRATION BIOPSY  08/08/2023   Procedure: BRONCHIAL NEEDLE ASPIRATION BIOPSIES;  Surgeon: Shelah Lamar RAMAN, MD;  Location: [facility] ENDOSCOPY;  Service: Pulmonary;;   CYSTOSCOPY W/ RETROGRADES N/A 11/03/2015   Procedure: CYSTOSCOPY WITH RETROGRADE PYELOGRAM;  Surgeon: Belvie LITTIE Clara, MD;  Location: [facility];  Service: Urology;  Laterality: N/A;   CYSTOSCOPY W/ RETROGRADES Left 01/09/2016   Procedure: CYSTOSCOPY WITH LEFT RETROGRADE PYELOGRAM;  Surgeon: Belvie LITTIE Clara, MD;  Location: [facility];  Service: Urology;  Laterality: Left;   CYSTOSCOPY W/ RETROGRADES Bilateral 05/28/2016   Procedure: CYSTOSCOPY WITH RETROGRADE PYELOGRAM;  Surgeon: Belvie LITTIE Clara, MD;  Location: [facility];  Service: Urology;  Laterality: Bilateral;   CYSTOSCOPY W/ RETROGRADES Bilateral 02/17/2021   Procedure: CYSTOSCOPY WITH RETROGRADE PYELOGRAM;  Surgeon: Rosalind Zachary NOVAK, MD;  Location: [facility];  Service: Urology;  Laterality: Bilateral;   CYSTOSCOPY W/ URETERAL STENT REMOVAL Left 01/09/2016   Procedure: CYSTOSCOPY WITH LEFT STENT  EXCHANGE;  Surgeon: Belvie LITTIE Clara, MD;  Location: [facility];  Service: Urology;  Laterality: Left;   CYSTOSCOPY WITH BIOPSY N/A 05/28/2016   Procedure: CYSTOSCOPY WITH BIOPSY;  Surgeon: Belvie LITTIE Clara, MD;  Location: [facility];  Service: Urology;  Laterality: N/A;   CYSTOSCOPY WITH URETHRAL DILATATION N/A 05/28/2016   Procedure: CYSTOSCOPY WITH URETHRAL DILATATION;  Surgeon: Belvie LITTIE Clara, MD;  Location: [facility];  Service: Urology;  Laterality: N/A;   HEMOSTASIS CONTROL  08/08/2023   Procedure: HEMOSTASIS CONTROL;  Surgeon: Shelah Lamar RAMAN, MD;  Location[facility] ENDOSCOPY;  Service: Pulmonary;;   IR IMAGING GUIDED PORT INSERTION  09/19/2023   PROSTATE BIOPSY  05/08/12   Adenocarcinoma   RADIOACTIVE SEED IMPLANT  08/01/2012   Procedure: RADIOACTIVE SEED IMPLANT;  Surgeon: Thomasine Oiler, MD;  Location: [facility];  Service: Urology;  Laterality: N/A;  73 seeds implanted no seeds found in bladder   TRANSURETHRAL RESECTION OF BLADDER TUMOR N/A 11/03/2015   Procedure: TRANSURETHRAL RESECTION OF BLADDER TUMOR (TURBT)WITH INSERTION STENT;  Surgeon: Belvie  LITTIE Clara, MD;  Location: [facility];  Service: Urology;  Laterality: N/A;   TRANSURETHRAL RESECTION OF BLADDER TUMOR N/A 02/17/2021   Procedure: TRANSURETHRAL RESECTION OF BLADDER TUMOR (TURBT);  Surgeon: Rosalind Zachary NOVAK, MD;  Location: [facility];  Service: Urology;  Laterality: N/A;  30 MINS   TRANSURETHRAL RESECTION OF BLADDER TUMOR WITH GYRUS (TURBT-GYRUS) N/A 01/09/2016   Procedure: TRANSURETHRAL RESECTION OF BLADDER TUMOR WITH GYRUS (TURBT-GYRUS);  Surgeon: Belvie LITTIE Clara, MD;  Location: [facility];  Service: Urology;  Laterality: N/A;   VIDEO BRONCHOSCOPY WITH RADIAL ENDOBRONCHIAL ULTRASOUND  08/08/2023   Procedure: VIDEO BRONCHOSCOPY WITH RADIAL ENDOBRONCHIAL ULTRASOUND;  Surgeon: Shelah Lamar RAMAN, MD;  Location: [facility]  ENDOSCOPY;  Service: Pulmonary;;    REVIEW OF SYSTEMS:   Review of Systems  Constitutional: Negative for appetite change, chills, fatigue, fever and unexpected weight change.  HENT: Negative for mouth sores, nosebleeds, sore throat and trouble swallowing.   Eyes: Negative for eye problems and icterus.  Respiratory: Negative for cough, hemoptysis, shortness of breath and wheezing.   Cardiovascular: Negative for chest pain and leg swelling.  Gastrointestinal: Negative for abdominal pain, constipation, diarrhea, nausea and vomiting.  Genitourinary: Negative for bladder incontinence, difficulty urinating, dysuria, frequency and hematuria.   Musculoskeletal: Negative for back pain, gait problem, neck pain and neck stiffness.  Skin: Negative for itching and rash.  Neurological: Negative for dizziness, extremity weakness, gait problem, headaches, light-headedness and seizures.  Hematological: Negative for adenopathy. Does not bruise/bleed easily.  Psychiatric/Behavioral: Negative for confusion, depression and sleep disturbance. The patient is not nervous/anxious.    PHYSICAL EXAMINATION:  There were no vitals taken for this visit.  ECOG PERFORMANCE STATUS: 1  Physical Exam  Constitutional: Oriented to person, place, and time and thin appearing male and in no distress.  HENT:  Head: Normocephalic and atraumatic.  Mouth/Throat: Oropharynx is clear and moist. No oropharyngeal exudate.  Eyes: Conjunctivae are normal. Right eye exhibits no discharge. Left eye exhibits no discharge. No scleral icterus.  Neck: Normal range of motion. Neck supple.  Cardiovascular: Normal rate, regular rhythm, normal heart sounds and intact distal pulses.   Pulmonary/Chest: Effort normal.  Quiet breath sounds in the left lung.  No respiratory distress. No wheezes. No rales.   Abdominal: Soft. Bowel sounds are normal. Exhibits no distension and no mass. There is no tenderness.  Musculoskeletal: Stable subcutaneous  nodules on the anterior chest wall. Normal range of motion. Exhibits no edema.  Lymphadenopathy:    No cervical adenopathy.  Neurological: Alert and oriented to person, place, and time. Exhibits normal muscle tone. Gait normal. Coordination normal.  Skin: Positive for scattered subcutaneous nodules.  Skin is warm and dry. No rash noted. Not diaphoretic. No erythema. No pallor.  Psychiatric: Mood, memory and judgment normal.  Vitals reviewed.  LABORATORY DATA: Lab Results  Component Value Date   WBC 10.2 01/02/2024   HGB 12.5 (L) 01/02/2024   HCT 39.1 01/02/2024   MCV 95.1 01/02/2024   PLT 167 01/02/2024      Chemistry      Component Value Date/Time   NA 140 01/02/2024 0736   K 4.0 01/02/2024 0736   CL 103 01/02/2024 0736   CO2 29 01/02/2024 0736   BUN 17 01/02/2024 0736   CREATININE 1.07 01/02/2024 0736      Component Value Date/Time   CALCIUM 9.9 01/02/2024 0736   ALKPHOS 74 01/02/2024 0736   AST 29 01/02/2024 0736   ALT 29 01/02/2024 0736  BILITOT 0.3 01/02/2024 0736       RADIOGRAPHIC STUDIES:  No results found.   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  Presented with a large left hilar/mediastinal mass, multiple bilateral pulmonary nodules, prevascular lymph node, and subcutaneous nodules consistent with metastatic disease.  He was diagnosed in January 2025. 3) history of prostate cancer in 2014 4) history of urothelial carcinoma in 2017   The patient is going to start a 3-week palliative course of radiation to the left lung.  The last days on 09/19/2023.  This will be given under the care of Dr. Dewey.   He underwent SBRT to the synchronous stage I NSCLC.   Given the small cell lung cancer in the left lung, Dr. Sherrod recommended chemotherapy with carboplatin  for an AUC of 5 on  day 1 and etoposide  100 mg/m2 on days 1, 2 and 3, and Imfinzi  1500 mg IV every 3 weeks with neulsta support. He started this on 09/06/23. He is status post 6 cycle and tolerated it well without any appreciable adverse side effects.   He completed WBR on 12/26/23.    Labs were reviewed. His CMP is pending. As long as this is within parameters, he is ok to treat. Recommend he proceed with cycle #7 today as scheduled.    We will see him in 5 weeks with cycle #8.   I will arrange for a restaging CT scan prior to his next cycle of treatment.   The patient was advised to call immediately if he has any concerning symptoms in the interval. The patient voices understanding of current disease status and treatment options and is in agreement with the current care plan. All questions were answered. The patient knows to call the clinic with any problems, questions or concerns. We can certainly see the patient much sooner if necessary       No orders of the defined types were placed in this encounter.    The total time spent in the appointment was 20-29 minutes  Breon Diss L Wallace Cogliano, PA-C 01/24/24

## 2024-01-31 ENCOUNTER — Inpatient Hospital Stay: Attending: Internal Medicine | Admitting: Physician Assistant

## 2024-01-31 ENCOUNTER — Inpatient Hospital Stay

## 2024-01-31 ENCOUNTER — Inpatient Hospital Stay: Attending: Internal Medicine

## 2024-01-31 VITALS — BP 129/61 | HR 81 | Temp 97.9°F | Resp 15 | Wt 152.3 lb

## 2024-01-31 DIAGNOSIS — C3411 Malignant neoplasm of upper lobe, right bronchus or lung: Secondary | ICD-10-CM | POA: Insufficient documentation

## 2024-01-31 DIAGNOSIS — Z8551 Personal history of malignant neoplasm of bladder: Secondary | ICD-10-CM | POA: Insufficient documentation

## 2024-01-31 DIAGNOSIS — Z923 Personal history of irradiation: Secondary | ICD-10-CM | POA: Diagnosis not present

## 2024-01-31 DIAGNOSIS — Z5112 Encounter for antineoplastic immunotherapy: Secondary | ICD-10-CM | POA: Diagnosis not present

## 2024-01-31 DIAGNOSIS — C3412 Malignant neoplasm of upper lobe, left bronchus or lung: Secondary | ICD-10-CM

## 2024-01-31 DIAGNOSIS — Z8546 Personal history of malignant neoplasm of prostate: Secondary | ICD-10-CM | POA: Insufficient documentation

## 2024-01-31 DIAGNOSIS — Z95828 Presence of other vascular implants and grafts: Secondary | ICD-10-CM

## 2024-01-31 LAB — CMP (CANCER CENTER ONLY)
ALT: 28 U/L (ref 0–44)
AST: 26 U/L (ref 15–41)
Albumin: 3.6 g/dL (ref 3.5–5.0)
Alkaline Phosphatase: 72 U/L (ref 38–126)
Anion gap: 4 — ABNORMAL LOW (ref 5–15)
BUN: 15 mg/dL (ref 8–23)
CO2: 32 mmol/L (ref 22–32)
Calcium: 9.6 mg/dL (ref 8.9–10.3)
Chloride: 105 mmol/L (ref 98–111)
Creatinine: 0.93 mg/dL (ref 0.61–1.24)
GFR, Estimated: >60 mL/min (ref >60)
Glucose, Bld: 106 mg/dL — ABNORMAL HIGH (ref 70–99)
Potassium: 4 mmol/L (ref 3.5–5.1)
Sodium: 141 mmol/L (ref 135–145)
Total Bilirubin: 0.5 mg/dL (ref 0.0–1.2)
Total Protein: 6.8 g/dL (ref 6.5–8.1)

## 2024-01-31 LAB — CBC WITH DIFFERENTIAL (CANCER CENTER ONLY)
Abs Immature Granulocytes: 0.03 K/uL (ref 0.00–0.07)
Basophils Absolute: 0 K/uL (ref 0.0–0.1)
Basophils Relative: 0 %
Eosinophils Absolute: 0.1 K/uL (ref 0.0–0.5)
Eosinophils Relative: 1 %
HCT: 39.2 % (ref 39.0–52.0)
Hemoglobin: 13 g/dL (ref 13.0–17.0)
Immature Granulocytes: 0 %
Lymphocytes Relative: 8 %
Lymphs Abs: 0.7 K/uL (ref 0.7–4.0)
MCH: 31 pg (ref 26.0–34.0)
MCHC: 33.2 g/dL (ref 30.0–36.0)
MCV: 93.6 fL (ref 80.0–100.0)
Monocytes Absolute: 1 K/uL (ref 0.1–1.0)
Monocytes Relative: 12 %
Neutro Abs: 6.7 K/uL (ref 1.7–7.7)
Neutrophils Relative %: 79 %
Platelet Count: 181 K/uL (ref 150–400)
RBC: 4.19 MIL/uL — ABNORMAL LOW (ref 4.22–5.81)
RDW: 13.4 % (ref 11.5–15.5)
WBC Count: 8.6 K/uL (ref 4.0–10.5)
nRBC: 0 % (ref 0.0–0.2)

## 2024-01-31 MED ORDER — SODIUM CHLORIDE 0.9% FLUSH
10.0000 mL | INTRAVENOUS | Status: DC | PRN
  Administered 2024-01-31: 10 mL

## 2024-01-31 MED ORDER — SODIUM CHLORIDE 0.9 % IV SOLN
1500.0000 mg | Freq: Once | INTRAVENOUS | Status: AC
  Administered 2024-01-31: 1500 mg via INTRAVENOUS
  Filled 2024-01-31: qty 30

## 2024-01-31 MED ORDER — SODIUM CHLORIDE 0.9 % IV SOLN: INTRAVENOUS | Status: DC

## 2024-01-31 MED ORDER — SODIUM CHLORIDE 0.9% FLUSH
10.0000 mL | Freq: Once | INTRAVENOUS | Status: AC
Start: 2024-01-31 — End: 2024-01-31
  Administered 2024-01-31: 10 mL

## 2024-01-31 MED ORDER — HEPARIN SOD (PORK) LOCK FLUSH 100 UNIT/ML IV SOLN
500.0000 [IU] | Freq: Once | INTRAVENOUS | Status: AC | PRN
  Administered 2024-01-31: 500 [IU]

## 2024-01-31 NOTE — Patient Instructions (Signed)

## 2024-02-02 ENCOUNTER — Other Ambulatory Visit: Payer: Self-pay

## 2024-02-07 DIAGNOSIS — C3492 Malignant neoplasm of unspecified part of left bronchus or lung: Secondary | ICD-10-CM | POA: Diagnosis not present

## 2024-02-07 DIAGNOSIS — I10 Essential (primary) hypertension: Secondary | ICD-10-CM | POA: Diagnosis not present

## 2024-02-07 DIAGNOSIS — C3491 Malignant neoplasm of unspecified part of right bronchus or lung: Secondary | ICD-10-CM | POA: Diagnosis not present

## 2024-02-07 DIAGNOSIS — R7303 Prediabetes: Secondary | ICD-10-CM | POA: Diagnosis not present

## 2024-02-09 ENCOUNTER — Other Ambulatory Visit: Payer: Self-pay

## 2024-02-14 ENCOUNTER — Ambulatory Visit (HOSPITAL_BASED_OUTPATIENT_CLINIC_OR_DEPARTMENT_OTHER)
Admission: RE | Admit: 2024-02-14 | Discharge: 2024-02-14 | Disposition: A | Discharge status: Home / Self Care | Source: Ambulatory Visit | Attending: Physician Assistant | Admitting: Physician Assistant

## 2024-02-14 DIAGNOSIS — I700 Atherosclerosis of aorta: Secondary | ICD-10-CM | POA: Diagnosis not present

## 2024-02-14 DIAGNOSIS — C3411 Malignant neoplasm of upper lobe, right bronchus or lung: Secondary | ICD-10-CM | POA: Insufficient documentation

## 2024-02-14 DIAGNOSIS — C3490 Malignant neoplasm of unspecified part of unspecified bronchus or lung: Secondary | ICD-10-CM | POA: Diagnosis not present

## 2024-02-14 DIAGNOSIS — N289 Disorder of kidney and ureter, unspecified: Secondary | ICD-10-CM | POA: Diagnosis not present

## 2024-02-14 MED ORDER — IOHEXOL 300 MG/ML  SOLN
80.0000 mL | Freq: Once | INTRAMUSCULAR | Status: AC | PRN
  Administered 2024-02-14: 80 mL via INTRAVENOUS

## 2024-02-14 MED ORDER — HEPARIN SOD (PORK) LOCK FLUSH 100 UNIT/ML IV SOLN
500.0000 [IU] | Freq: Once | INTRAVENOUS | Status: AC
  Administered 2024-02-14: 500 [IU] via INTRAVENOUS

## 2024-02-14 NOTE — Progress Notes (Signed)
 [facility] OFFICE PROGRESS NOTE  Practice, [facility] [address] 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  Presented with a large left hilar/mediastinal mass, multiple bilateral pulmonary nodules, prevascular lymph node, and subcutaneous nodules consistent with metastatic disease.  He was diagnosed in January 2025. 3) history of prostate cancer in 2014 4) history of urothelial carcinoma in 2017 5) WBR under the care of Dr. Dewey, last day on 12/26/23  PRIOR THERAPY: 1)  palliative course of radiation to the left lung. The last days expected on 09/16/2023. Under the care of Dr. Dewey. Completed on 09/19/23  2) RUL was treated with a course of stereotactic body radiation treatment by Dr. Dewey on 12/07/23 to the synchronous stage I NSCLC  CURRENT THERAPY: 1) Palliative systemic chemotherapy with carboplatin  for an AUC of 5 on day 1 and etoposide  100 mg/m2 on days 1, 2 and 3, and Imfinzi  1500 mg IV every 3 weeks with neulsta support. First dose on 09/06/23. Status post 7 cycles.   INTERVAL HISTORY: John [name] 78 y.o. male returns to the clinic today for a follow-up visit. The patient establish care in the clinic on 08/25/2023. The patient was found to have extensive stage small cell lung cancer in addition to synchronous early-stage non-small cell lung cancer.  He completed palliative radiation to the left lung under the care of Dr. Dewey and SBRT to the synchronous stage I non-small cell lung cancer.  He is status post 4 cycles of chemotherapy and immunotherapy and tolerates it well. He started maintenance treatment with immunotherapy.   He feels well today but he did lose weight. Food is not tasting well. He does drink boost sometimes 3x per day.   About three weeks ago, he experienced shortness of breath, which improved after using  inhalers. This has occurred twice. No current breathing issues, cough, or chest discomfort are present.  No nausea or diarrhea. He experiences constipation, for which he takes an extra diet every four days, which he reports is effective.  No fever, infections, or rashes.  He recently had a restaging CT scan. He is here today for evaluation before undergoing cycle #8.    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  MEDICATIONS:  Current Outpatient Medications  Medication Sig Dispense Refill   albuterol  (VENTOLIN  HFA) 108 (90 Base) MCG/ACT inhaler Inhale 1-2 puffs into the lungs every 6 (six) hours as needed  for wheezing or shortness of breath. 8 g 2   amLODipine (NORVASC) 10 MG tablet Take 10 mg by mouth daily.     COMIRNATY syringe      FLUZONE HIGH-DOSE 0.5 ML injection      lidocaine -prilocaine  (EMLA ) cream Apply 1 Application topically as needed. (Patient not taking: Reported on 12/26/2023) 30 g 2   memantine  (NAMENDA ) 10 MG tablet Take 1 tablet (10 mg total) by mouth 2 (two) times daily. 60 tablet 4   memantine  (NAMENDA ) 5 MG tablet Begin this  prescription the first day of brain radiation. Week 1: take one tablet po qam. Week 2: take one tablet qam and qpm. Week 3: take two tablets qam, and one tablet po q pm. Week 4: take two tablets qam and qpm. Fill subsequent prescription q month. 70 tablet 0   prochlorperazine  (COMPAZINE ) 10 MG tablet Take 1 tablet (10 mg total) by mouth every 6 (six) hours as needed. 30 tablet 2   No current facility-administered medications for this visit.    SURGICAL HISTORY:  Past Surgical History:  Procedure Laterality Date   ABDOMINAL HERNIA REPAIR  1990's   BRONCHIAL BIOPSY  08/08/2023   Procedure: BRONCHIAL BIOPSIES;  Surgeon: Shelah Lamar RAMAN, MD;  Location: [facility] ENDOSCOPY;  Service: Pulmonary;;   BRONCHIAL BRUSHINGS  08/08/2023   Procedure: BRONCHIAL BRUSHINGS;  Surgeon: Shelah Lamar RAMAN, MD;  Location: [facility] ENDOSCOPY;  Service: Pulmonary;;   BRONCHIAL NEEDLE ASPIRATION BIOPSY  08/08/2023   Procedure: BRONCHIAL NEEDLE ASPIRATION BIOPSIES;  Surgeon: Shelah Lamar RAMAN, MD;  Location: [facility] ENDOSCOPY;  Service: Pulmonary;;   CYSTOSCOPY W/ RETROGRADES N/A 11/03/2015   Procedure: CYSTOSCOPY WITH RETROGRADE PYELOGRAM;  Surgeon: Belvie LITTIE Clara, MD;  Location: [facility];  Service: Urology;  Laterality: N/A;   CYSTOSCOPY W/ RETROGRADES Left 01/09/2016   Procedure: CYSTOSCOPY WITH LEFT RETROGRADE PYELOGRAM;  Surgeon: Belvie LITTIE Clara, MD;  Location: [facility];  Service: Urology;  Laterality: Left;   CYSTOSCOPY W/ RETROGRADES Bilateral 05/28/2016   Procedure: CYSTOSCOPY WITH RETROGRADE PYELOGRAM;  Surgeon: Belvie LITTIE Clara, MD;  Location: [facility];  Service: Urology;  Laterality: Bilateral;   CYSTOSCOPY W/ RETROGRADES Bilateral 02/17/2021   Procedure: CYSTOSCOPY WITH RETROGRADE PYELOGRAM;  Surgeon: Rosalind Zachary NOVAK, MD;  Location: [facility];  Service: Urology;  Laterality: Bilateral;   CYSTOSCOPY W/ URETERAL STENT REMOVAL Left 01/09/2016   Procedure:  CYSTOSCOPY WITH LEFT STENT EXCHANGE;  Surgeon: Belvie LITTIE Clara, MD;  Location: [facility];  Service: Urology;  Laterality: Left;   CYSTOSCOPY WITH BIOPSY N/A 05/28/2016   Procedure: CYSTOSCOPY WITH BIOPSY;  Surgeon: Belvie LITTIE Clara, MD;  Location: [facility];  Service: Urology;  Laterality: N/A;   CYSTOSCOPY WITH URETHRAL DILATATION N/A 05/28/2016   Procedure: CYSTOSCOPY WITH URETHRAL DILATATION;  Surgeon: Belvie LITTIE Clara, MD;  Location: [facility];  Service: Urology;  Laterality: N/A;   HEMOSTASIS CONTROL  08/08/2023   Procedure: HEMOSTASIS CONTROL;  Surgeon: Shelah Lamar RAMAN, MD;  Location: [facility] ENDOSCOPY;  Service: Pulmonary;;   IR IMAGING GUIDED PORT INSERTION  09/19/2023   PROSTATE BIOPSY  05/08/12   Adenocarcinoma   RADIOACTIVE SEED IMPLANT  08/01/2012   Procedure: RADIOACTIVE SEED IMPLANT;  Surgeon: Thomasine Oiler, MD;  Location: [facility];  Service: Urology;  Laterality: N/A;  73 seeds implanted no seeds found in bladder   TRANSURETHRAL RESECTION OF BLADDER TUMOR N/A 11/03/2015   Procedure: TRANSURETHRAL RESECTION OF BLADDER TUMOR (TURBT)WITH  INSERTION STENT;  Surgeon: Belvie LITTIE Clara, MD;  Location: [facility];  Service: Urology;  Laterality: N/A;   TRANSURETHRAL RESECTION OF BLADDER TUMOR N/A 02/17/2021   Procedure: TRANSURETHRAL RESECTION OF BLADDER TUMOR (TURBT);  Surgeon: Rosalind Zachary NOVAK, MD;  Location: [facility];  Service: Urology;  Laterality: N/A;  30 MINS   TRANSURETHRAL RESECTION OF BLADDER TUMOR WITH GYRUS (TURBT-GYRUS) N/A 01/09/2016   Procedure: TRANSURETHRAL RESECTION OF BLADDER TUMOR WITH GYRUS (TURBT-GYRUS);  Surgeon: Belvie LITTIE Clara, MD;  Location: [facility];  Service: Urology;  Laterality: N/A;   VIDEO BRONCHOSCOPY WITH RADIAL ENDOBRONCHIAL ULTRASOUND  08/08/2023   Procedure: VIDEO BRONCHOSCOPY WITH RADIAL ENDOBRONCHIAL ULTRASOUND;  Surgeon: Shelah Lamar RAMAN, MD;  Location: [facility] ENDOSCOPY;  Service: Pulmonary;;    REVIEW OF SYSTEMS:   Review of Systems  Constitutional: Positive for fatigue. Negative for appetite change, chills, fever and unexpected weight change.  HENT: Positive for taste alterations. Negative for mouth sores, nosebleeds, sore throat and trouble swallowing.   Eyes: Negative for eye problems and icterus.  Respiratory: Negative for cough, hemoptysis, shortness of breath (none at this time) and wheezing.   Cardiovascular: Negative for chest pain and leg swelling.  Gastrointestinal: Negative for abdominal pain, constipation, diarrhea, nausea and vomiting.  Genitourinary: Negative for bladder incontinence, difficulty urinating, dysuria, frequency and hematuria.   Musculoskeletal: Negative for back pain, gait problem, neck pain and neck stiffness.  Skin: Negative for itching and rash.  Neurological: Negative for dizziness, extremity weakness, gait problem, headaches, light-headedness and seizures.  Hematological: Negative for adenopathy. Does not bruise/bleed easily.  Psychiatric/Behavioral: Negative for confusion, depression and sleep disturbance. The patient is not nervous/anxious.    PHYSICAL EXAMINATION:  There were no vitals taken for this visit.  ECOG PERFORMANCE STATUS: 1  Physical Exam  Constitutional: Oriented to person, place, and time and thin appearing male and in no distress.  HENT:  Head: Normocephalic and atraumatic.  Mouth/Throat: Oropharynx is clear and moist. No oropharyngeal exudate.  Eyes: Conjunctivae are normal. Right eye exhibits no discharge. Left eye exhibits no discharge. No scleral icterus.  Neck: Normal range of motion. Neck supple.  Cardiovascular: Normal rate, regular rhythm, normal heart sounds and intact distal pulses.   Pulmonary/Chest: Effort normal.  Quiet breath sounds in the left lung.  No respiratory distress. No wheezes. No rales.   Abdominal: Soft. Bowel sounds are normal. Exhibits  no distension and no mass. There is no tenderness.  Musculoskeletal: Stable subcutaneous nodules on the anterior chest wall. Normal range of motion. Exhibits no edema.  Lymphadenopathy:    No cervical adenopathy.  Neurological: Alert and oriented to person, place, and time. Exhibits normal muscle tone. Gait normal. Coordination normal.  Skin: Positive for scattered subcutaneous nodules.  Skin is warm and dry. No rash noted. Not diaphoretic. No erythema. No pallor.  Psychiatric: Mood, memory and judgment normal.  Vitals reviewed.  LABORATORY DATA: Lab Results  Component Value Date   WBC 8.6 01/31/2024   HGB 13.0 01/31/2024   HCT 39.2 01/31/2024   MCV 93.6 01/31/2024   PLT 181 01/31/2024      Chemistry      Component Value Date/Time   NA 141 01/31/2024 0820   K 4.0 01/31/2024 0820   CL 105 01/31/2024 0820   CO2 32 01/31/2024 0820   BUN 15 01/31/2024 0820   CREATININE 0.93 01/31/2024 0820      Component Value Date/Time   CALCIUM 9.6 01/31/2024 0820   ALKPHOS 72  01/31/2024 0820   AST 26 01/31/2024 0820   ALT 28 01/31/2024 0820   BILITOT 0.5 01/31/2024 0820       RADIOGRAPHIC STUDIES:  No results found.   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  Presented with a large left hilar/mediastinal mass, multiple bilateral pulmonary nodules, prevascular lymph node, and subcutaneous nodules consistent with metastatic disease.  He was diagnosed in January 2025. 3) history of prostate cancer in 2014 4) history of urothelial carcinoma in 2017   The patient is going to start a 3-week palliative course of radiation to the left lung.  The last days on 09/19/2023.  This will be given under the care of Dr. Dewey.   He underwent SBRT to the synchronous stage I NSCLC.    Given the small cell lung cancer in  the left lung, Dr. Sherrod recommended chemotherapy with carboplatin  for an AUC of 5 on day 1 and etoposide  100 mg/m2 on days 1, 2 and 3, and Imfinzi  1500 mg IV every 3 weeks with neulsta support. He started this on 09/06/23. He is status post 7 cycle and tolerated it well without any appreciable adverse side effects.   He completed WBR on 12/26/23.   The patient was seen with Dr. Sherrod today.  Dr. Sherrod personally and independently reviewed the scan and discussed results with the patient today.  The scan showed new subpleural/pleural nodularity in the lower left hemithorax, new pancreatic tail nodule and new exophytic lesion off the left kidney.  Dr. Sherrod recommends a PET scan.   He will continue with his treatment today as scheduled. We will see him back in 4 weeks to review the PET scan. Of course, if any concerns we will bring him back sooner.    Labs were reviewed. Recommend he proceed with cycle #8 today as scheduled.    We will see him in 5 weeks with cycle #9.   Noted current use of Boost nutritional supplements to maintain caloric intake. - Recommended salt water  rinses or biotin to improve taste sensation.  The patient was advised to call immediately if he has any concerning symptoms in the interval. The patient voices understanding of current disease status and treatment options and is in agreement with the current care plan. All questions were answered. The patient knows to call the clinic with any problems, questions or concerns. We can certainly see the patient much sooner if necessary    No orders of the defined types were placed in this encounter.   John Basulto L Jess Sulak, PA-C 02/14/24  ADDENDUM: Hematology/Oncology Attending: I had a face-to-face encounter with the patient today.  I reviewed his record, lab, scan and recommended his care plan.  This is a very pleasant 78 years old African-American male with extensive stage small cell lung cancer and currently  undergoing a course of palliative systemic chemoimmunotherapy initially with carboplatin , etoposide  and Imfinzi  every 3 weeks for 4 cycles and starting from cycle #5 he has been on maintenance treatment with single agent Imfinzi  status post a total of 7 cycles of treatment.  The patient has been tolerating the treatment well except for fatigue. He had repeat CT scan of the chest, abdomen and pelvis performed recently.  I personally and independently reviewed the scan and discussed the result with the patient today.  His scan showed evidence for  disease progression evidenced by the new subpleural and pleural nodularity in the lower left hemothorax in addition to new pancreatic tail nodule and new exophytic lesion of the left kidney. I recommended for the patient to have repeat PET scan for further evaluation of these lesions before changing his treatment. He will proceed with cycle #8 of immunotherapy with Imfinzi  today. Will see him back for follow-up visit in 4 weeks for evaluation and discussion of his treatment options based on the PET scan results. If the PET scan showed concerning finding for disease progression and then we will switch his treatment to second line systemic chemotherapy versus [place]. The patient and his daughter agreed to the current plan. He was advised to call immediately if he has any other concerning symptoms in the interval. The total time spent in the appointment was 30 minutes including review of chart and various tests results, discussions about plan of care and coordination of care plan . Disclaimer: This note was dictated with voice recognition software. Similar sounding words can inadvertently be transcribed and may be missed upon review. Sherrod MARLA Sherrod, MD

## 2024-02-16 ENCOUNTER — Telehealth: Payer: Self-pay | Admitting: *Deleted

## 2024-02-16 NOTE — Telephone Encounter (Signed)
 Notified sister of August and September appts

## 2024-02-27 ENCOUNTER — Inpatient Hospital Stay

## 2024-02-27 ENCOUNTER — Inpatient Hospital Stay (HOSPITAL_BASED_OUTPATIENT_CLINIC_OR_DEPARTMENT_OTHER): Attending: Internal Medicine | Admitting: Physician Assistant

## 2024-02-27 ENCOUNTER — Inpatient Hospital Stay: Attending: Internal Medicine

## 2024-02-27 VITALS — BP 116/75 | HR 95 | Temp 97.9°F | Resp 17 | Wt 146.5 lb

## 2024-02-27 DIAGNOSIS — C3411 Malignant neoplasm of upper lobe, right bronchus or lung: Secondary | ICD-10-CM

## 2024-02-27 DIAGNOSIS — N2889 Other specified disorders of kidney and ureter: Secondary | ICD-10-CM | POA: Diagnosis not present

## 2024-02-27 DIAGNOSIS — C3490 Malignant neoplasm of unspecified part of unspecified bronchus or lung: Secondary | ICD-10-CM | POA: Diagnosis not present

## 2024-02-27 DIAGNOSIS — R918 Other nonspecific abnormal finding of lung field: Secondary | ICD-10-CM | POA: Diagnosis not present

## 2024-02-27 DIAGNOSIS — Z923 Personal history of irradiation: Secondary | ICD-10-CM | POA: Insufficient documentation

## 2024-02-27 DIAGNOSIS — Z5112 Encounter for antineoplastic immunotherapy: Secondary | ICD-10-CM | POA: Diagnosis not present

## 2024-02-27 DIAGNOSIS — Z8546 Personal history of malignant neoplasm of prostate: Secondary | ICD-10-CM | POA: Insufficient documentation

## 2024-02-27 DIAGNOSIS — Z8551 Personal history of malignant neoplasm of bladder: Secondary | ICD-10-CM | POA: Insufficient documentation

## 2024-02-27 DIAGNOSIS — C3402 Malignant neoplasm of left main bronchus: Secondary | ICD-10-CM | POA: Diagnosis not present

## 2024-02-27 DIAGNOSIS — C3412 Malignant neoplasm of upper lobe, left bronchus or lung: Secondary | ICD-10-CM

## 2024-02-27 DIAGNOSIS — Z95828 Presence of other vascular implants and grafts: Secondary | ICD-10-CM

## 2024-02-27 DIAGNOSIS — K8689 Other specified diseases of pancreas: Secondary | ICD-10-CM | POA: Insufficient documentation

## 2024-02-27 LAB — CBC WITH DIFFERENTIAL (CANCER CENTER ONLY)
Abs Immature Granulocytes: 0.13 K/uL — ABNORMAL HIGH (ref 0.00–0.07)
Basophils Absolute: 0 K/uL (ref 0.0–0.1)
Basophils Relative: 0 %
Eosinophils Absolute: 0.1 K/uL (ref 0.0–0.5)
Eosinophils Relative: 0 %
HCT: 41 % (ref 39.0–52.0)
Hemoglobin: 13.8 g/dL (ref 13.0–17.0)
Immature Granulocytes: 1 %
Lymphocytes Relative: 5 %
Lymphs Abs: 0.6 K/uL — ABNORMAL LOW (ref 0.7–4.0)
MCH: 31.7 pg (ref 26.0–34.0)
MCHC: 33.7 g/dL (ref 30.0–36.0)
MCV: 94.3 fL (ref 80.0–100.0)
Monocytes Absolute: 0.9 K/uL (ref 0.1–1.0)
Monocytes Relative: 8 %
Neutro Abs: 9.9 K/uL — ABNORMAL HIGH (ref 1.7–7.7)
Neutrophils Relative %: 86 %
Platelet Count: 193 K/uL (ref 150–400)
RBC: 4.35 MIL/uL (ref 4.22–5.81)
RDW: 13 % (ref 11.5–15.5)
WBC Count: 11.6 K/uL — ABNORMAL HIGH (ref 4.0–10.5)
nRBC: 0 % (ref 0.0–0.2)

## 2024-02-27 LAB — CMP (CANCER CENTER ONLY)
ALT: 15 U/L (ref 0–44)
AST: 17 U/L (ref 15–41)
Albumin: 3.9 g/dL (ref 3.5–5.0)
Alkaline Phosphatase: 67 U/L (ref 38–126)
Anion gap: 6 (ref 5–15)
BUN: 21 mg/dL (ref 8–23)
CO2: 31 mmol/L (ref 22–32)
Calcium: 10.1 mg/dL (ref 8.9–10.3)
Chloride: 104 mmol/L (ref 98–111)
Creatinine: 1.15 mg/dL (ref 0.61–1.24)
GFR, Estimated: >60 mL/min (ref >60)
Glucose, Bld: 167 mg/dL — ABNORMAL HIGH (ref 70–99)
Potassium: 3.9 mmol/L (ref 3.5–5.1)
Sodium: 141 mmol/L (ref 135–145)
Total Bilirubin: 0.5 mg/dL (ref 0.0–1.2)
Total Protein: 7 g/dL (ref 6.5–8.1)

## 2024-02-27 MED ORDER — SODIUM CHLORIDE 0.9% FLUSH
10.0000 mL | INTRAVENOUS | Status: DC | PRN
  Administered 2024-02-27: 10 mL

## 2024-02-27 MED ORDER — SODIUM CHLORIDE 0.9 % IV SOLN
1500.0000 mg | Freq: Once | INTRAVENOUS | Status: AC
  Administered 2024-02-27: 1500 mg via INTRAVENOUS
  Filled 2024-02-27: qty 30

## 2024-02-27 MED ORDER — SODIUM CHLORIDE 0.9% FLUSH
10.0000 mL | Freq: Once | INTRAVENOUS | Status: AC
  Administered 2024-02-27: 10 mL

## 2024-02-27 MED ORDER — SODIUM CHLORIDE 0.9 % IV SOLN
INTRAVENOUS | Status: DC
Start: 2024-02-27 — End: 2024-02-27

## 2024-02-29 ENCOUNTER — Other Ambulatory Visit: Payer: Self-pay

## 2024-03-05 ENCOUNTER — Encounter (HOSPITAL_COMMUNITY)
Admission: RE | Admit: 2024-03-05 | Discharge: 2024-03-05 | Disposition: A | Discharge status: Home / Self Care | Source: Ambulatory Visit | Attending: Physician Assistant | Admitting: Physician Assistant

## 2024-03-05 DIAGNOSIS — C3402 Malignant neoplasm of left main bronchus: Secondary | ICD-10-CM | POA: Diagnosis not present

## 2024-03-05 DIAGNOSIS — C3490 Malignant neoplasm of unspecified part of unspecified bronchus or lung: Secondary | ICD-10-CM | POA: Diagnosis not present

## 2024-03-05 DIAGNOSIS — R918 Other nonspecific abnormal finding of lung field: Secondary | ICD-10-CM | POA: Diagnosis not present

## 2024-03-05 LAB — GLUCOSE, CAPILLARY: Glucose-Capillary: 89 mg/dL (ref 70–99)

## 2024-03-05 MED ORDER — FLUDEOXYGLUCOSE F - 18 (FDG) INJECTION
7.2700 | Freq: Once | INTRAVENOUS | Status: AC
  Administered 2024-03-05: 7.27 via INTRAVENOUS

## 2024-03-05 MED ORDER — HEPARIN SOD (PORK) LOCK FLUSH 100 UNIT/ML IV SOLN
500.0000 [IU] | Freq: Once | INTRAVENOUS | Status: AC
  Administered 2024-03-05: 500 [IU] via INTRAVENOUS

## 2024-03-26 ENCOUNTER — Inpatient Hospital Stay (HOSPITAL_BASED_OUTPATIENT_CLINIC_OR_DEPARTMENT_OTHER): Attending: Internal Medicine | Admitting: Internal Medicine

## 2024-03-26 ENCOUNTER — Inpatient Hospital Stay

## 2024-03-26 ENCOUNTER — Inpatient Hospital Stay: Attending: Internal Medicine

## 2024-03-26 ENCOUNTER — Encounter: Payer: Self-pay | Admitting: Internal Medicine

## 2024-03-26 VITALS — BP 116/55 | HR 90 | Temp 97.3°F | Resp 17 | Ht 72.0 in | Wt 149.3 lb

## 2024-03-26 VITALS — BP 114/59 | HR 85 | Temp 98.1°F | Resp 20

## 2024-03-26 DIAGNOSIS — C3411 Malignant neoplasm of upper lobe, right bronchus or lung: Secondary | ICD-10-CM

## 2024-03-26 DIAGNOSIS — Z8546 Personal history of malignant neoplasm of prostate: Secondary | ICD-10-CM | POA: Diagnosis not present

## 2024-03-26 DIAGNOSIS — Z8551 Personal history of malignant neoplasm of bladder: Secondary | ICD-10-CM | POA: Diagnosis not present

## 2024-03-26 DIAGNOSIS — C3412 Malignant neoplasm of upper lobe, left bronchus or lung: Secondary | ICD-10-CM

## 2024-03-26 DIAGNOSIS — Z5112 Encounter for antineoplastic immunotherapy: Secondary | ICD-10-CM | POA: Insufficient documentation

## 2024-03-26 DIAGNOSIS — Z923 Personal history of irradiation: Secondary | ICD-10-CM | POA: Diagnosis not present

## 2024-03-26 DIAGNOSIS — Z79620 Long term (current) use of immunosuppressive biologic: Secondary | ICD-10-CM | POA: Insufficient documentation

## 2024-03-26 DIAGNOSIS — N2889 Other specified disorders of kidney and ureter: Secondary | ICD-10-CM | POA: Insufficient documentation

## 2024-03-26 LAB — CBC WITH DIFFERENTIAL (CANCER CENTER ONLY)
Abs Immature Granulocytes: 0.04 K/uL (ref 0.00–0.07)
Basophils Absolute: 0 K/uL (ref 0.0–0.1)
Basophils Relative: 0 %
Eosinophils Absolute: 0.1 K/uL (ref 0.0–0.5)
Eosinophils Relative: 2 %
HCT: 37.1 % — ABNORMAL LOW (ref 39.0–52.0)
Hemoglobin: 12.3 g/dL — ABNORMAL LOW (ref 13.0–17.0)
Immature Granulocytes: 1 %
Lymphocytes Relative: 9 %
Lymphs Abs: 0.7 K/uL (ref 0.7–4.0)
MCH: 31.6 pg (ref 26.0–34.0)
MCHC: 33.2 g/dL (ref 30.0–36.0)
MCV: 95.4 fL (ref 80.0–100.0)
Monocytes Absolute: 0.7 K/uL (ref 0.1–1.0)
Monocytes Relative: 10 %
Neutro Abs: 5.8 K/uL (ref 1.7–7.7)
Neutrophils Relative %: 78 %
Platelet Count: 169 K/uL (ref 150–400)
RBC: 3.89 MIL/uL — ABNORMAL LOW (ref 4.22–5.81)
RDW: 13.2 % (ref 11.5–15.5)
WBC Count: 7.4 K/uL (ref 4.0–10.5)
nRBC: 0 % (ref 0.0–0.2)

## 2024-03-26 LAB — CMP (CANCER CENTER ONLY)
ALT: 20 U/L (ref 0–44)
AST: 19 U/L (ref 15–41)
Albumin: 3.6 g/dL (ref 3.5–5.0)
Alkaline Phosphatase: 66 U/L (ref 38–126)
Anion gap: 4 — ABNORMAL LOW (ref 5–15)
BUN: 16 mg/dL (ref 8–23)
CO2: 31 mmol/L (ref 22–32)
Calcium: 9.6 mg/dL (ref 8.9–10.3)
Chloride: 109 mmol/L (ref 98–111)
Creatinine: 0.94 mg/dL (ref 0.61–1.24)
GFR, Estimated: >60 mL/min (ref >60)
Glucose, Bld: 87 mg/dL (ref 70–99)
Potassium: 4.2 mmol/L (ref 3.5–5.1)
Sodium: 144 mmol/L (ref 135–145)
Total Bilirubin: 0.5 mg/dL (ref 0.0–1.2)
Total Protein: 6.7 g/dL (ref 6.5–8.1)

## 2024-03-26 LAB — TSH: TSH: 0.576 u[IU]/mL (ref 0.350–4.500)

## 2024-03-26 MED ORDER — SODIUM CHLORIDE 0.9 % IV SOLN
1500.0000 mg | Freq: Once | INTRAVENOUS | Status: AC
  Administered 2024-03-26: 1500 mg via INTRAVENOUS
  Filled 2024-03-26: qty 30

## 2024-03-26 MED ORDER — SODIUM CHLORIDE 0.9 % IV SOLN: INTRAVENOUS | Status: DC

## 2024-03-26 MED ORDER — SODIUM CHLORIDE 0.9% FLUSH
10.0000 mL | INTRAVENOUS | Status: DC | PRN
  Administered 2024-03-26: 10 mL

## 2024-03-26 NOTE — Progress Notes (Signed)
 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  Presented with a large left hilar/mediastinal mass, multiple bilateral pulmonary nodules, prevascular lymph node, and subcutaneous nodules consistent with metastatic disease.  He was diagnosed in January 2025. 3) history of prostate cancer in 2014 4) history of urothelial carcinoma in 2017 5) WBR under the care of Dr. Dewey, last day on 12/26/23   PRIOR THERAPY: 1)  palliative course of radiation to the left lung. The last days expected on 09/16/2023. Under the care of Dr. Dewey. Completed on 09/19/23  2) RUL was treated with a course of stereotactic body radiation treatment by Dr. Dewey on 12/07/23 to the synchronous stage I NSCLC   CURRENT THERAPY: 1) Palliative systemic chemotherapy with carboplatin  for an AUC of 5 on day 1 and etoposide  100 mg/m2 on days 1, 2 and 3, and Imfinzi  1500 mg IV every 3 weeks with neulsta support. First dose on 09/06/23. Status post 8 cycles.    INTERVAL HISTORY: John [name] 78 y.o. male returns to the clinic today for follow-up visit.Discussed the use of AI scribe software for clinical note transcription with the patient, who gave verbal consent to proceed.  History of Present Illness John [name] is a 78 year old male with extensive stage small cell lung cancer and synchronous right upper lobe adenocarcinoma who presents for follow-up of his cancer treatment.  He was diagnosed with extensive stage small cell lung cancer and synchronous right upper lobe adenocarcinoma in January 2025. He has a history of prostate cancer diagnosed in 2005 and urothelial carcinoma in 2017.  He underwent whole brain radiation in June 2025 and received palliative  treatment to a left lung nodule in March 2025. He is currently on a chemotherapy regimen including carboplatin , etoposide , and durvalumab  every three weeks, and today marks cycle number nine.  A recent PET scan showed a new hypermetabolic nodule near the left kidney.  He has no new complaints and states that 'everything's going good'. No pain or discomfort.     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  MEDICATIONS:  Current Outpatient Medications  Medication Sig Dispense Refill   albuterol  (VENTOLIN  HFA) 108 (90 Base) MCG/ACT inhaler Inhale 1-2 puffs into the lungs every 6 (six) hours as needed for wheezing or shortness  of breath. 8 g 2   amLODipine (NORVASC) 10 MG tablet Take 10 mg by mouth daily.     COMIRNATY syringe      FLUZONE HIGH-DOSE 0.5 ML injection      lidocaine -prilocaine  (EMLA ) cream Apply 1 Application topically as needed. 30 g 2   memantine  (NAMENDA ) 10 MG tablet Take 1 tablet (10 mg total) by mouth 2 (two) times daily. 60 tablet 4   memantine  (NAMENDA ) 5 MG tablet Begin this prescription the first day of brain radiation. Week 1: take one  tablet po qam. Week 2: take one tablet qam and qpm. Week 3: take two tablets qam, and one tablet po q pm. Week 4: take two tablets qam and qpm. Fill subsequent prescription q month. 70 tablet 0   prochlorperazine  (COMPAZINE ) 10 MG tablet Take 1 tablet (10 mg total) by mouth every 6 (six) hours as needed. 30 tablet 2   No current facility-administered medications for this visit.    SURGICAL HISTORY:  Past Surgical History:  Procedure Laterality Date   ABDOMINAL HERNIA REPAIR  1990's   BRONCHIAL BIOPSY  08/08/2023   Procedure: BRONCHIAL BIOPSIES;  Surgeon: Shelah Lamar RAMAN, MD;  Location: [facility] ENDOSCOPY;  Service: Pulmonary;;   BRONCHIAL BRUSHINGS  08/08/2023   Procedure: BRONCHIAL BRUSHINGS;  Surgeon: Shelah Lamar RAMAN, MD;  Location: [facility] ENDOSCOPY;  Service: Pulmonary;;   BRONCHIAL NEEDLE ASPIRATION BIOPSY  08/08/2023   Procedure: BRONCHIAL NEEDLE ASPIRATION BIOPSIES;  Surgeon: Shelah Lamar RAMAN, MD;  Location: [facility] ENDOSCOPY;  Service: Pulmonary;;   CYSTOSCOPY W/ RETROGRADES N/A 11/03/2015   Procedure: CYSTOSCOPY WITH RETROGRADE PYELOGRAM;  Surgeon: Belvie LITTIE Clara, MD;  Location: [facility];  Service: Urology;  Laterality: N/A;   CYSTOSCOPY W/ RETROGRADES Left 01/09/2016   Procedure: CYSTOSCOPY WITH LEFT RETROGRADE PYELOGRAM;  Surgeon: Belvie LITTIE Clara, MD;  Location: [facility];  Service: Urology;  Laterality: Left;   CYSTOSCOPY W/ RETROGRADES Bilateral 05/28/2016   Procedure: CYSTOSCOPY WITH RETROGRADE PYELOGRAM;  Surgeon: Belvie LITTIE Clara, MD;  Location: [facility];  Service: Urology;  Laterality: Bilateral;   CYSTOSCOPY W/ RETROGRADES Bilateral 02/17/2021   Procedure: CYSTOSCOPY WITH RETROGRADE PYELOGRAM;  Surgeon: Rosalind Zachary NOVAK, MD;  Location: [facility];  Service: Urology;  Laterality: Bilateral;   CYSTOSCOPY W/ URETERAL STENT REMOVAL Left 01/09/2016   Procedure: CYSTOSCOPY WITH LEFT STENT EXCHANGE;  Surgeon: Belvie LITTIE Clara, MD;  Location: [facility];  Service: Urology;  Laterality: Left;   CYSTOSCOPY WITH BIOPSY N/A 05/28/2016   Procedure: CYSTOSCOPY WITH BIOPSY;  Surgeon: Belvie LITTIE Clara, MD;  Location: [facility];  Service: Urology;  Laterality: N/A;   CYSTOSCOPY WITH URETHRAL DILATATION N/A 05/28/2016   Procedure: CYSTOSCOPY WITH URETHRAL DILATATION;  Surgeon: Belvie LITTIE Clara, MD;  Location: [facility];  Service: Urology;  Laterality: N/A;   HEMOSTASIS CONTROL  08/08/2023   Procedure: HEMOSTASIS CONTROL;  Surgeon: Shelah Lamar RAMAN, MD;  Location: [facility] ENDOSCOPY;  Service: Pulmonary;;   IR IMAGING GUIDED PORT INSERTION  09/19/2023   PROSTATE BIOPSY  05/08/12   Adenocarcinoma   RADIOACTIVE SEED IMPLANT  08/01/2012   Procedure: RADIOACTIVE SEED IMPLANT;  Surgeon: Thomasine Oiler, MD;  Location: [facility];  Service: Urology;  Laterality: N/A;  73 seeds implanted no seeds found in bladder   TRANSURETHRAL RESECTION OF BLADDER TUMOR N/A 11/03/2015   Procedure: TRANSURETHRAL RESECTION OF BLADDER TUMOR (TURBT)WITH INSERTION STENT;  Surgeon: Belvie LITTIE Clara, MD;  Location:   SURGERY CENTER;  Service: Urology;  Laterality: N/A;   TRANSURETHRAL RESECTION OF BLADDER TUMOR N/A 02/17/2021   Procedure: TRANSURETHRAL RESECTION OF BLADDER TUMOR (TURBT);  Surgeon: Rosalind Zachary NOVAK, MD;  Location: [facility];  Service: Urology;  Laterality: N/A;  30 MINS   TRANSURETHRAL RESECTION OF BLADDER TUMOR WITH GYRUS (TURBT-GYRUS) N/A 01/09/2016   Procedure: TRANSURETHRAL RESECTION OF BLADDER TUMOR WITH GYRUS (TURBT-GYRUS);  Surgeon: Belvie LITTIE Clara, MD;  Location: [facility];  Service: Urology;  Laterality: N/A;   VIDEO BRONCHOSCOPY WITH RADIAL ENDOBRONCHIAL ULTRASOUND  08/08/2023   Procedure: VIDEO BRONCHOSCOPY WITH RADIAL ENDOBRONCHIAL ULTRASOUND;  Surgeon: Shelah Lamar RAMAN, MD;  Location: [facility] ENDOSCOPY;  Service: Pulmonary;;     REVIEW OF SYSTEMS:  Constitutional: negative Eyes: negative Ears, nose, mouth, throat, and face: negative Respiratory: negative Cardiovascular: negative Gastrointestinal: negative Genitourinary:negative Integument/breast: negative Hematologic/lymphatic: negative Musculoskeletal:negative Neurological: negative Behavioral/Psych: negative Endocrine: negative Allergic/Immunologic: negative   PHYSICAL EXAMINATION: General appearance: alert, cooperative, fatigued, and no distress Head: Normocephalic, without obvious abnormality, atraumatic Neck: no adenopathy, no JVD, supple, symmetrical, trachea midline, and thyroid  not enlarged, symmetric, no tenderness/mass/nodules Lymph nodes: Cervical, supraclavicular, and axillary nodes normal. Resp: clear to auscultation bilaterally Back: symmetric, no curvature. ROM normal. No CVA tenderness. Cardio: regular rate and rhythm, S1, S2 normal, no murmur, click, rub or gallop GI: soft, non-tender; bowel sounds normal; no masses,  no organomegaly Extremities: extremities normal, atraumatic, no cyanosis or edema Neurologic: Alert and oriented X 3, normal strength and tone. Normal symmetric reflexes. Normal coordination and gait  ECOG PERFORMANCE STATUS: 1 - Symptomatic but completely ambulatory  Blood pressure (!) 116/55, pulse 90, temperature (!) 97.3 F (36.3 C), resp. rate 17, height 6' (1.829 m), weight 149 lb 4.8 oz (67.7 kg), SpO2 98%.  LABORATORY DATA: Lab Results  Component Value Date   WBC 7.4 03/26/2024   HGB 12.3 (L) 03/26/2024   HCT 37.1 (L) 03/26/2024   MCV 95.4 03/26/2024   PLT 169 03/26/2024      Chemistry      Component Value Date/Time   NA 141 02/27/2024 0942   K 3.9 02/27/2024 0942   CL 104 02/27/2024 0942   CO2 31 02/27/2024 0942   BUN 21 02/27/2024 0942   CREATININE 1.15 02/27/2024 0942      Component Value Date/Time   CALCIUM 10.1 02/27/2024 0942   ALKPHOS 67 02/27/2024 0942   AST 17 02/27/2024 0942   ALT  15 02/27/2024 0942   BILITOT 0.5 02/27/2024 0942       RADIOGRAPHIC STUDIES: NM PET Image Restag (PS) Skull Base To Thigh Result Date: 03/14/2024 CLINICAL DATA:  Subsequent treatment strategy for small cell lung cancer. EXAM: NUCLEAR MEDICINE PET SKULL BASE TO THIGH TECHNIQUE: 7.3 mCi F-18 FDG was injected intravenously. Full-ring PET imaging was performed from the skull base to thigh after the radiotracer. CT data was obtained and used for attenuation correction and anatomic localization. Fasting blood glucose: 89 mg/dl COMPARISON:  CT chest abdomen pelvis 02/14/2024 and PET 08/19/2023. FINDINGS: Mediastinal blood pool activity: SUV max 2.4 Liver activity: SUV max NA NECK: No abnormal hypermetabolism. Incidental CT findings: None. CHEST: No residual hypermetabolic adenopathy. Volume loss in the perihilar left upper lobe and superior segment left lower lobe with mild hypermetabolism, SUV max 5.1, likely due to radiation therapy. Additional similar patchy volume loss and mild hypermetabolism in the posterior segment right upper lobe and minimally within the superior segment right lower lobe. Previously seen areas of subcutaneous hypermetabolism  have resolved or nearly completely resolved in the interval. Incidental CT findings: Right IJ Port-A-Cath terminates in the right atrium. Atherosclerotic calcification of the aorta, aortic valve and coronary arteries. Heart is at the upper limits of normal in size. No pericardial or pleural effusion. Centrilobular and paraseptal emphysema. ABDOMEN/PELVIS: Mild diffuse gastric hypermetabolism. New hypermetabolic 1.6 x 1.9 cm nodule arising from, or lateral to, the interpolar left kidney (4/121), SUV max 16.9. No additional abnormal hypermetabolism. Incidental CT findings: Low-attenuation lesions in the left kidney. No specific follow-up necessary. Mild proximal gastric wall thickening. Radiotherapy seeds in the prostate. SKELETON: No abnormal hypermetabolism. Incidental  CT findings: Degenerative changes in the spine. IMPRESSION: 1. Mixed response to therapy with resolution of hypermetabolic mediastinal adenopathy and subcutaneous soft tissue nodules. New hypermetabolic nodule adjacent to, or arising from, the left kidney, indicative of a metastasis. 2. Subpleural/pleural nodularity seen in the lower left hemithorax, no longer visualized. 3. No abnormal hypermetabolism associated with a possible new nodule in the pancreatic tail. 4. Evolving changes of radiation therapy in upper and lower lobes. 5. Mild diffuse gastric hypermetabolism with proximal gastric wall thickening. Findings may be due to gastritis. Please correlate clinically. 6. Aortic atherosclerosis (ICD10-I70.0). Coronary artery calcification. 7.  Emphysema (ICD10-J43.9). Electronically Signed   By: Newell Eke M.D.   On: 03/14/2024 08:51    ASSESSMENT AND PLAN:  This is a very pleasant 78 years old African-American male with  extensive stage small cell lung cancer.  Presented with a large left hilar/mediastinal mass, multiple bilateral pulmonary nodules, prevascular lymph node, and subcutaneous nodules consistent with metastatic disease.  He was diagnosed in January 2025.  He also has a history of a stage I adenocarcinoma of the right upper lobe status post SBRT. He is currently undergoing systemic chemotherapy with carboplatin  for AUC of 5 on day 1, etoposide  100 Mg/M2 on days 1, 2 and 3 with Neulasta  support in addition to Imfinzi  1500 Mg IV on day 1 every 3 weeks.  He is status post 8 cycles.  Starting from cycle #5 the patient is on maintenance treatment with Imfinzi  1500 Mg IV every 4 weeks. The patient had a PET scan performed recently that showed mixed response with resolution of the hypermetabolic mediastinal adenopathy subcutaneous soft tissue nodules but there was new hypermetabolic nodule adjacent to Onpro arising from the left kidney indicative of a metastasis. Assessment and Plan Assessment &  Plan Extensive stage small cell lung cancer and right upper lobe lung adenocarcinoma Diagnosed in January 2025. Currently undergoing treatment with carboplatin , etoposide , and durvalumab  every three weeks. Today marks cycle number nine. Recent PET scan shows shrinkage of the tumor in the lung and other areas, indicating a positive response to treatment. No new complaints or issues reported. - Continue current chemotherapy regimen with carboplatin , etoposide , and durvalumab .  New hypermetabolic nodule near left kidney, possible malignancy New hypermetabolic nodule identified near the left kidney on recent PET scan. Concerning for possible malignancy related to existing cancer. Requires further evaluation and treatment. - Refer to radiation oncologist for consideration of SBRT to the area near the left kidney. The patient was advised to call immediately if he has any other concerning symptoms in the interval.  The patient voices understanding of current disease status and treatment options and is in agreement with the current care plan.  All questions were answered. The patient knows to call the clinic with any problems, questions or concerns. We can certainly see the patient much sooner if necessary.  The  total time spent in the appointment was 30 minutes.  Disclaimer: This note was dictated with voice recognition software. Similar sounding words can inadvertently be transcribed and may not be corrected upon review.

## 2024-03-26 NOTE — Patient Instructions (Signed)

## 2024-03-27 LAB — T4: T4, Total: 7.8 ug/dL (ref 4.5–12.0)

## 2024-03-28 ENCOUNTER — Other Ambulatory Visit: Payer: Self-pay

## 2024-03-28 ENCOUNTER — Telehealth: Payer: Self-pay | Admitting: Medical Oncology

## 2024-03-28 NOTE — Telephone Encounter (Signed)
 Pastor asked if we called him. I told him no. He said he is doing ok. I have a little constipation and I am taking something now for it.

## 2024-03-31 ENCOUNTER — Other Ambulatory Visit: Payer: Self-pay

## 2024-04-02 ENCOUNTER — Telehealth: Payer: Self-pay | Admitting: Radiation Oncology

## 2024-04-02 NOTE — Telephone Encounter (Signed)
 Left message with sooner availability for upcoming appointment per provider's request.

## 2024-04-04 ENCOUNTER — Ambulatory Visit
Admission: RE | Admit: 2024-04-04 | Discharge: 2024-04-04 | Disposition: A | Discharge status: Home / Self Care | Source: Ambulatory Visit | Attending: Radiation Oncology | Admitting: Radiation Oncology

## 2024-04-04 ENCOUNTER — Encounter: Payer: Self-pay | Admitting: Radiation Oncology

## 2024-04-04 DIAGNOSIS — C3411 Malignant neoplasm of upper lobe, right bronchus or lung: Secondary | ICD-10-CM

## 2024-04-04 DIAGNOSIS — C3412 Malignant neoplasm of upper lobe, left bronchus or lung: Secondary | ICD-10-CM

## 2024-04-04 DIAGNOSIS — C7902 Secondary malignant neoplasm of left kidney and renal pelvis: Secondary | ICD-10-CM

## 2024-04-04 DIAGNOSIS — Z87891 Personal history of nicotine dependence: Secondary | ICD-10-CM | POA: Diagnosis not present

## 2024-04-04 DIAGNOSIS — C7931 Secondary malignant neoplasm of brain: Secondary | ICD-10-CM | POA: Diagnosis not present

## 2024-04-04 NOTE — Progress Notes (Signed)
 Spoke with the patient to review his chart.  He reports he is doing well.  He states he has some mild constipation and is using laxatives PRN.  Denies any urinary changes.  He reports some pain to his left flank/side 4/10.  He was seen by Dr. Sherrod last week who has recommended SBRT to his left kidney.  PET 03/05/2024:  Mixed response to therapy with resolution of hypermetabolic mediastinal adenopathy and subcutaneous soft tissue nodules. New hypermetabolic nodule adjacent to, or arising from, the left kidney, indicative of a metastasis.   SAFETY ISSUES: Prior radiation? PCI Brain 12/2023, SBRT Right Lung 11/2023. Pacemaker/ICD? No  Possible current pregnancy? N/a Is the patient on methotrexate? No  Current Complaints / other details:    Port in place

## 2024-04-05 ENCOUNTER — Ambulatory Visit
Admission: RE | Admit: 2024-04-05 | Discharge: 2024-04-05 | Disposition: A | Discharge status: Home / Self Care | Source: Ambulatory Visit | Attending: Physician Assistant | Admitting: Physician Assistant

## 2024-04-05 ENCOUNTER — Other Ambulatory Visit: Payer: Self-pay | Admitting: Radiation Oncology

## 2024-04-05 DIAGNOSIS — Z79620 Long term (current) use of immunosuppressive biologic: Secondary | ICD-10-CM | POA: Diagnosis not present

## 2024-04-05 DIAGNOSIS — Z5112 Encounter for antineoplastic immunotherapy: Secondary | ICD-10-CM | POA: Insufficient documentation

## 2024-04-05 DIAGNOSIS — Z8551 Personal history of malignant neoplasm of bladder: Secondary | ICD-10-CM | POA: Diagnosis not present

## 2024-04-05 DIAGNOSIS — C3412 Malignant neoplasm of upper lobe, left bronchus or lung: Secondary | ICD-10-CM | POA: Insufficient documentation

## 2024-04-05 DIAGNOSIS — Z79630 Long term (current) use of alkylating agent: Secondary | ICD-10-CM | POA: Diagnosis not present

## 2024-04-05 DIAGNOSIS — Z87891 Personal history of nicotine dependence: Secondary | ICD-10-CM | POA: Diagnosis not present

## 2024-04-05 DIAGNOSIS — Z8546 Personal history of malignant neoplasm of prostate: Secondary | ICD-10-CM | POA: Diagnosis not present

## 2024-04-05 DIAGNOSIS — Z5111 Encounter for antineoplastic chemotherapy: Secondary | ICD-10-CM | POA: Insufficient documentation

## 2024-04-05 DIAGNOSIS — Z510 Encounter for antineoplastic radiation therapy: Secondary | ICD-10-CM | POA: Diagnosis not present

## 2024-04-05 DIAGNOSIS — Z79634 Long term (current) use of topoisomerase inhibitor: Secondary | ICD-10-CM | POA: Diagnosis not present

## 2024-04-05 DIAGNOSIS — C3411 Malignant neoplasm of upper lobe, right bronchus or lung: Secondary | ICD-10-CM | POA: Insufficient documentation

## 2024-04-05 DIAGNOSIS — C7900 Secondary malignant neoplasm of unspecified kidney and renal pelvis: Secondary | ICD-10-CM | POA: Insufficient documentation

## 2024-04-05 DIAGNOSIS — C61 Malignant neoplasm of prostate: Secondary | ICD-10-CM | POA: Insufficient documentation

## 2024-04-05 DIAGNOSIS — C7989 Secondary malignant neoplasm of other specified sites: Secondary | ICD-10-CM | POA: Diagnosis not present

## 2024-04-05 NOTE — Progress Notes (Signed)
 Radiation Oncology         [phone]) [phone] ________________________________  Outpatient Re-Consultation - Conducted via telephone at patient request.  I spoke with the patient to conduct this re-consult visit via telephone. The patient was notified in advance and was offered an in person or telemedicine meeting to allow for face to face communication but instead preferred to proceed with a telephone visit.  Name: John [name]        MRN: [id number]  Date of Service: 04/04/2024 DOB: 11-Dec-1945  RR:Emjruprz, [facility], [place], [place]     REFERRING PHYSICIAN: Sherrod Sherrod, MD   DIAGNOSIS: The primary encounter diagnosis was Malignant neoplasm of upper lobe of left lung (HCC). 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   HISTORY OF PRESENT ILLNESS: John [name] is a 78 y.o. 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  He had a CTA to follow-up on an enlarged pulmonary artery on 05/05/2023 which showed  a large left hilar mass measuring 7.4 cm causing severe narrowing of the left upper lobe pulmonary artery and superior left pulmonary vein with an enlarged right hilar lymph node measuring 10 mm.  In addition there was complete occlusion of the apical posterior and anterior left upper lobe bronchi and centrilobular nodules in the left upper lobe.  There was also a spiculated nodule in the right upper lobe measuring 2.4 cm.  He was sent for evaluation with pulmonary medicine, and CT super D scan showed an increase in the mass in the AP window  measuring up to 9.2 cm, and the right upper lobe mass measuring up to 2.6 cm.  Ultimately he underwent a bronchoscopy on 08/08/2023.  Final cytology results showed adenocarcinoma on FNA involving the right upper lobe and atypical cells in the brushing.  In addition the left upper lobe specimen including brushing and biopsy showed small cell carcinoma.   His PET scan showed that he had limited stage small cell disease but was felt to be stage I cancer in his right upper lobe so he was able to undergo chemoradiation for his left lung cancer followed by stereotactic body radiotherapy for his right lung cancer.  Given his excellent performance status and desires to remain aggressive he was also open to prophylactic cranial irradiation which he completed in June 2025.  He has transitioned to systemic immunotherapy for consolidation, but recent restaging CT scan showed concern for a new nodular area coming off the interpolar left kidney measuring 1.9 cm.  A PET scan on 02/14/24 to restage him showed no residual hypermetabolic adenopathy, post radiation effect with SUV of 5.1 along the left lower lobe and left upper lobe.  Volume loss and mild hypermetabolism in the posterior segment of the right upper lobe was also noted to be consistent with posttreatment change and the only new area that was felt to be persisting and actively malignant was new hypermetabolic activity in the nodule arising from/lateral to the interpolar left kidney measuring 1.9 cm in greatest dimension.  The SUV was 16.9.  Given this finding he has been referred  back by medical oncology to consider locally aggressive ablative radiotherapy as the plan is to continue consolidative immunotherapy.  He is also due for restaging MRI scan of the brain.   PREVIOUS RADIATION THERAPY: Yes   12/13/23-12/26/23 Plan Name: Brain_PCI Site: Brain Technique: IMRT Mode: Photon Dose Per Fraction: 2.5 Gy Prescribed Dose (Delivered / Prescribed): 25 Gy / 25  Gy Prescribed Fxs (Delivered / Prescribed): 10 / 10  11/30/23-12/07/23 SBRT Treatment: The tumor in the RUL was treated with a course of stereotactic body radiation treatment. The patient received 54 Gy In 3 fractions at 18 G per fraction.  08/29/23-09/19/23 Plan Name: Lung_L Site: Lung, Left Technique: 3D Mode: Photon Dose Per Fraction: 2.5 Gy Prescribed Dose (Delivered / Prescribed): 37.5 Gy / 37.5 Gy Prescribed Fxs (Delivered / Prescribed): 15 / 15  08/01/2012 Site/dose: Prostate 14,500 cGy, isotope I-125 utilizing 73 seeds and 24 needles. Individual seed activity 0.43 mCi per seed for a total implant activity of 31.9 mCi with Dr. 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   worked up with cardiology dr hochrein and f/u prn   Nocturia 02/11/2021   Prostate CA (HCC) 2024   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   Wears glasses 02/11/2021       PAST SURGICAL HISTORY: Past Surgical History:  Procedure Laterality Date   ABDOMINAL HERNIA REPAIR  1990's   BRONCHIAL BIOPSY  08/08/2023   Procedure: BRONCHIAL BIOPSIES;  Surgeon: Shelah Lamar RAMAN, MD;  Location: [facility];  Service: Pulmonary;;   BRONCHIAL BRUSHINGS  08/08/2023   Procedure: BRONCHIAL BRUSHINGS;  Surgeon: Shelah Lamar RAMAN, MD;  Location: [facility];  Service: Pulmonary;;    BRONCHIAL NEEDLE ASPIRATION BIOPSY  08/08/2023   Procedure: BRONCHIAL NEEDLE ASPIRATION BIOPSIES;  Surgeon: Shelah Lamar RAMAN, MD;  Location: [facility];  Service: Pulmonary;;   CYSTOSCOPY W/ RETROGRADES N/A 11/03/2015   Procedure: CYSTOSCOPY WITH RETROGRADE PYELOGRAM;  Surgeon: Belvie LITTIE sherrilee, MD;  Location[facility];  Service: Urology;  Laterality: N/A;   CYSTOSCOPY W/ RETROGRADES Left 01/09/2016   Procedure: CYSTOSCOPY WITH LEFT RETROGRADE PYELOGRAM;  Surgeon: Belvie LITTIE sherrilee, MD;  Location: [facility];  Service: Urology;  Laterality: Left;   CYSTOSCOPY W/ RETROGRADES Bilateral 05/28/2016   Procedure: CYSTOSCOPY WITH RETROGRADE PYELOGRAM;  Surgeon: Belvie LITTIE sherrilee, MD;  Location: [facility];  Service: Urology;  Laterality: Bilateral;   CYSTOSCOPY W/ RETROGRADES Bilateral 02/17/2021   Procedure: CYSTOSCOPY WITH RETROGRADE PYELOGRAM;  Surgeon: Rosalind Zachary NOVAK, MD;  Location: [facility];  Service: Urology;  Laterality: Bilateral;   CYSTOSCOPY W/ URETERAL STENT REMOVAL Left 01/09/2016   Procedure: CYSTOSCOPY WITH LEFT STENT EXCHANGE;  Surgeon: Belvie LITTIE sherrilee, MD;  Location: [facility];  Service: Urology;  Laterality: Left;   CYSTOSCOPY WITH BIOPSY N/A 05/28/2016   Procedure: CYSTOSCOPY WITH BIOPSY;  Surgeon: Belvie LITTIE Clara, MD;  Location: [facility];  Service: Urology;  Laterality: N/A;   CYSTOSCOPY WITH URETHRAL DILATATION N/A 05/28/2016   Procedure: CYSTOSCOPY WITH URETHRAL DILATATION;  Surgeon: Belvie LITTIE Clara, MD;  Location: [facility];  Service: Urology;  Laterality: N/A;   HEMOSTASIS CONTROL  08/08/2023   Procedure: HEMOSTASIS CONTROL;  Surgeon: Shelah Lamar RAMAN, MD;  Location: [facility];  Service: Pulmonary;;   IR IMAGING GUIDED PORT INSERTION  09/19/2023   PROSTATE BIOPSY  05/08/12   Adenocarcinoma   RADIOACTIVE SEED IMPLANT  08/01/2012   Procedure:  RADIOACTIVE SEED IMPLANT;  Surgeon: Thomasine Oiler, MD;  Location: [facility];  Service: Urology;  Laterality: N/A;  73 seeds implanted no seeds found in bladder   TRANSURETHRAL RESECTION OF BLADDER TUMOR N/A 11/03/2015   Procedure: TRANSURETHRAL RESECTION OF BLADDER TUMOR (TURBT)WITH INSERTION STENT;  Surgeon: Belvie LITTIE Clara, MD;  Location: [facility];  Service: Urology;  Laterality: N/A;   TRANSURETHRAL RESECTION OF BLADDER TUMOR N/A 02/17/2021   Procedure: TRANSURETHRAL RESECTION OF BLADDER TUMOR (TURBT);  Surgeon: Rosalind Zachary NOVAK, MD;  Location: [facility];  Service: Urology;  Laterality: N/A;  30 MINS   TRANSURETHRAL RESECTION OF BLADDER TUMOR WITH GYRUS (TURBT-GYRUS) N/A 01/09/2016   Procedure: TRANSURETHRAL RESECTION OF BLADDER TUMOR WITH GYRUS (TURBT-GYRUS);  Surgeon: Belvie LITTIE Clara, MD;  Location: [facility];  Service: Urology;  Laterality: N/A;   VIDEO BRONCHOSCOPY WITH RADIAL ENDOBRONCHIAL ULTRASOUND  08/08/2023   Procedure: VIDEO BRONCHOSCOPY WITH RADIAL ENDOBRONCHIAL ULTRASOUND;  Surgeon: Shelah Lamar RAMAN, MD;  Location: [facility];  Service: Pulmonary;;     FAMILY HISTORY:  Family History  Problem Relation Age of Onset   Hyperlipidemia Mother    Asthma Mother    Heart failure Mother    Aneurysm Father    Hyperlipidemia Sister    Asthma Sister      SOCIAL HISTORY:  reports that he quit smoking about 9 years ago. His smoking use included cigarettes. He started smoking about 51 years ago. He has a 21 pack-year smoking history. He has never used smokeless tobacco. He reports current alcohol use. He reports current drug use. Drug: Marijuana.  The patient is single and is supported by his niece [place].  He lives in [place] .  He has worked in Dance movement psychotherapist.   ALLERGIES: Patient has no known allergies.   MEDICATIONS:  Current Outpatient Medications  Medication Sig Dispense Refill    albuterol  (VENTOLIN  HFA) 108 (90 Base) MCG/ACT inhaler Inhale 1-2 puffs into the lungs every 6 (six) hours as needed for wheezing or shortness of breath. 8 g 2   amLODipine (NORVASC) 10 MG tablet Take 10 mg by mouth daily.     COMIRNATY syringe      FLUZONE HIGH-DOSE 0.5 ML injection      lidocaine -prilocaine  (EMLA ) cream Apply 1 Application topically as needed. 30 g 2   memantine  (NAMENDA ) 10 MG tablet Take 1 tablet (10 mg total) by mouth 2 (two) times daily. 60 tablet 4   memantine  (NAMENDA ) 5 MG tablet Begin this prescription the first day of brain radiation. Week 1: take one tablet po qam. Week 2: take one tablet qam and qpm. Week 3: take two tablets qam, and one tablet po q pm. Week 4: take two tablets qam and qpm. Fill subsequent prescription q month. 70  tablet 0   prochlorperazine  (COMPAZINE ) 10 MG tablet Take 1 tablet (10 mg total) by mouth every 6 (six) hours as needed. 30 tablet 2   No current facility-administered medications for this encounter.     REVIEW OF SYSTEMS: On review of systems, the patient reports that he is doing quite well.  He states that he does have some fatigue related to his immunotherapy.  He has been able to do most everything he enjoys despite this.  He states that he has noticed some soreness in the left chest wall about the level of the left nipple.  He denies any radicular symptoms from the posterior trunk, and states he is not having any flank pain, nausea or vomiting but has nausea medication if needed.  No other complaints are verbalized.    PHYSICAL EXAM:  Unable to examine given encounter type    ECOG = 1  0 - Asymptomatic (Fully active, able to carry on all predisease activities without restriction)  1 - Symptomatic but completely ambulatory (Restricted in physically strenuous activity but ambulatory and able to carry out work of a light or sedentary nature. For example, light housework, office work)  2 - Symptomatic, <50% in bed during the day  (Ambulatory and capable of all self care but unable to carry out any work activities. Up and about more than 50% of waking hours)  3 - Symptomatic, >50% in bed, but not bedbound (Capable of only limited self-care, confined to bed or chair 50% or more of waking hours)  4 - Bedbound (Completely disabled. Cannot carry on any self-care. Totally confined to bed or chair)  5 - Death   Raylene MM, Creech RH, Tormey DC, et al. [phone]). Toxicity and response criteria of the [facility]. Am. DOROTHA Bridges. Oncol. 5 (6): 649-55    LABORATORY DATA:  Lab Results  Component Value Date   WBC 7.4 03/26/2024   HGB 12.3 (L) 03/26/2024   HCT 37.1 (L) 03/26/2024   MCV 95.4 03/26/2024   PLT 169 03/26/2024   Lab Results  Component Value Date   NA 144 03/26/2024   K 4.2 03/26/2024   CL 109 03/26/2024   CO2 31 03/26/2024   Lab Results  Component Value Date   ALT 20 03/26/2024   AST 19 03/26/2024   ALKPHOS 66 03/26/2024   BILITOT 0.5 03/26/2024      RADIOGRAPHY: No results found.      IMPRESSION/PLAN: 1. Progressive Limited Small cell carcinoma involving the LUL and synchronous Stage IA3, cT1cN0M0, NSCLC, adenocarcinoma of the RUL.  The patient appears to be doing well regarding his history of early-stage right lung cancer.  He has completed his systemic chemotherapy treatments for his small cell carcinoma, and continues consolidative immunotherapy.  He recently completed prophylactic cranial irradiation as well.  Unfortunately he has new disease but because this appears to be oligometastatic, he would be a candidate for definitive radiation to the soft tissue nodule adjacent to the left kidney.  Dr. Dewey discusses the rationale to consider either a stereotactic body radiotherapy (SBRT) approach versus consideration of an ultra hypofractionated course based on the location of his bowel.  The patient remains active with a great performance status and is motivated to continue to be  aggressive locally as much as is reasonable for his current situation and any future findings of progressive or recurrent disease.  Dr. Dewey recommends a course of either 5 or up to 10 fractions pending position of his bowel at  the time of simulation and would use oral contrast to distinguish this.  The patient is in agreement.  He will come tomorrow in person at which time he will sign written consent to proceed.  We will coordinate his upcoming MRI scan for the brain for surveillance following his prophylactic treatment.  He continues with Namenda  for cognitive protection from his prior radiation and should complete this prior to the end of the year. 2. Left chest wall pain.  When he comes tomorrow for simulation, we will coordinate for the site to have a BB or wire located where he is having pain so Dr. Dewey can evaluate the rib at that level.  Further recommendations will come from additional evaluation of this. 3. History of T1c favorable adenocarcinoma of the prostate.  The patient continues to be without evidence of disease based on PSA and follows with [facility].  He is also followed in that group as well because of his history of transitional bladder cancer and we appreciate their recommendations and management for the patient.    This encounter was conducted via telephone.  The patient has provided two factor identification and has given verbal consent for this type of encounter and has been advised to only accept a meeting of this type in a secure network environment. The time spent during this encounter was 45 minutes including preparation, discussion, and coordination of the patient's care. The attendants for this meeting include Sharene Cary, RN, Dr. Dewey, Donald Estefana Husband  and [name].  During the encounter,  Sharene Cary, RN, Dr. Dewey, and Donald Estefana Husband were located at [facility] Radiation Oncology Department.  [name] was located at home.     The above documentation reflects my direct findings during this shared patient visit. Please see the separate note by Dr. Dewey on this date for the remainder of the patient's plan of care.    Donald KYM Husband, [facility]   **Disclaimer: This note was dictated with voice recognition software. Similar sounding words can inadvertently be transcribed and this note may contain transcription errors which may not have been corrected upon publication of note.**

## 2024-04-06 ENCOUNTER — Other Ambulatory Visit: Payer: Self-pay

## 2024-04-16 ENCOUNTER — Ambulatory Visit
Admission: RE | Admit: 2024-04-16 | Discharge: 2024-04-16 | Disposition: A | Discharge status: Home / Self Care | Source: Ambulatory Visit | Attending: Physician Assistant | Admitting: Physician Assistant

## 2024-04-16 DIAGNOSIS — Z79630 Long term (current) use of alkylating agent: Secondary | ICD-10-CM | POA: Insufficient documentation

## 2024-04-16 DIAGNOSIS — C3412 Malignant neoplasm of upper lobe, left bronchus or lung: Secondary | ICD-10-CM | POA: Diagnosis not present

## 2024-04-16 DIAGNOSIS — C7989 Secondary malignant neoplasm of other specified sites: Secondary | ICD-10-CM | POA: Diagnosis not present

## 2024-04-16 DIAGNOSIS — Z79620 Long term (current) use of immunosuppressive biologic: Secondary | ICD-10-CM | POA: Diagnosis not present

## 2024-04-16 DIAGNOSIS — Z8546 Personal history of malignant neoplasm of prostate: Secondary | ICD-10-CM | POA: Insufficient documentation

## 2024-04-16 DIAGNOSIS — Z510 Encounter for antineoplastic radiation therapy: Secondary | ICD-10-CM | POA: Insufficient documentation

## 2024-04-16 DIAGNOSIS — Z5112 Encounter for antineoplastic immunotherapy: Secondary | ICD-10-CM | POA: Diagnosis not present

## 2024-04-16 DIAGNOSIS — Z8551 Personal history of malignant neoplasm of bladder: Secondary | ICD-10-CM | POA: Insufficient documentation

## 2024-04-16 DIAGNOSIS — Z87891 Personal history of nicotine dependence: Secondary | ICD-10-CM | POA: Insufficient documentation

## 2024-04-16 DIAGNOSIS — C61 Malignant neoplasm of prostate: Secondary | ICD-10-CM | POA: Insufficient documentation

## 2024-04-16 DIAGNOSIS — C3411 Malignant neoplasm of upper lobe, right bronchus or lung: Secondary | ICD-10-CM | POA: Diagnosis not present

## 2024-04-16 DIAGNOSIS — Z5111 Encounter for antineoplastic chemotherapy: Secondary | ICD-10-CM | POA: Diagnosis not present

## 2024-04-16 DIAGNOSIS — Z79634 Long term (current) use of topoisomerase inhibitor: Secondary | ICD-10-CM | POA: Diagnosis not present

## 2024-04-17 ENCOUNTER — Other Ambulatory Visit: Payer: Self-pay

## 2024-04-17 ENCOUNTER — Ambulatory Visit: Admitting: Radiation Oncology

## 2024-04-17 ENCOUNTER — Ambulatory Visit

## 2024-04-17 DIAGNOSIS — Z5112 Encounter for antineoplastic immunotherapy: Secondary | ICD-10-CM | POA: Diagnosis not present

## 2024-04-17 LAB — RAD ONC ARIA SESSION SUMMARY
Course Elapsed Days: 0
Plan Fractions Treated to Date: 1
Plan Prescribed Dose Per Fraction: 10 Gy
Plan Total Fractions Prescribed: 5
Plan Total Prescribed Dose: 50 Gy
Reference Point Dosage Given to Date: 10 Gy
Reference Point Session Dosage Given: 10 Gy
Session Number: 1

## 2024-04-18 ENCOUNTER — Ambulatory Visit: Admitting: Radiation Oncology

## 2024-04-19 ENCOUNTER — Ambulatory Visit
Admission: RE | Admit: 2024-04-19 | Discharge: 2024-04-19 | Disposition: A | Discharge status: Home / Self Care | Source: Ambulatory Visit | Attending: Radiation Oncology | Admitting: Radiation Oncology

## 2024-04-19 ENCOUNTER — Other Ambulatory Visit: Payer: Self-pay

## 2024-04-19 DIAGNOSIS — Z5112 Encounter for antineoplastic immunotherapy: Secondary | ICD-10-CM | POA: Diagnosis not present

## 2024-04-19 LAB — RAD ONC ARIA SESSION SUMMARY
Course Elapsed Days: 2
Plan Fractions Treated to Date: 2
Plan Prescribed Dose Per Fraction: 10 Gy
Plan Total Fractions Prescribed: 5
Plan Total Prescribed Dose: 50 Gy
Reference Point Dosage Given to Date: 20 Gy
Reference Point Session Dosage Given: 10 Gy
Session Number: 2

## 2024-04-20 ENCOUNTER — Ambulatory Visit: Admitting: Radiation Oncology

## 2024-04-22 ENCOUNTER — Other Ambulatory Visit: Payer: Self-pay

## 2024-04-22 ENCOUNTER — Emergency Department (HOSPITAL_COMMUNITY)
Admission: EM | Admit: 2024-04-22 | Discharge: 2024-04-22 | Disposition: A | Discharge status: Home / Self Care | Attending: Emergency Medicine | Admitting: Emergency Medicine

## 2024-04-22 ENCOUNTER — Encounter (HOSPITAL_COMMUNITY): Payer: Self-pay | Admitting: Emergency Medicine

## 2024-04-22 ENCOUNTER — Emergency Department (HOSPITAL_COMMUNITY)

## 2024-04-22 DIAGNOSIS — Z8546 Personal history of malignant neoplasm of prostate: Secondary | ICD-10-CM | POA: Diagnosis not present

## 2024-04-22 DIAGNOSIS — I10 Essential (primary) hypertension: Secondary | ICD-10-CM | POA: Insufficient documentation

## 2024-04-22 DIAGNOSIS — R079 Chest pain, unspecified: Secondary | ICD-10-CM | POA: Diagnosis not present

## 2024-04-22 DIAGNOSIS — J439 Emphysema, unspecified: Secondary | ICD-10-CM | POA: Diagnosis not present

## 2024-04-22 DIAGNOSIS — Z79899 Other long term (current) drug therapy: Secondary | ICD-10-CM | POA: Diagnosis not present

## 2024-04-22 DIAGNOSIS — Z85118 Personal history of other malignant neoplasm of bronchus and lung: Secondary | ICD-10-CM | POA: Insufficient documentation

## 2024-04-22 DIAGNOSIS — R918 Other nonspecific abnormal finding of lung field: Secondary | ICD-10-CM | POA: Diagnosis not present

## 2024-04-22 DIAGNOSIS — R0789 Other chest pain: Secondary | ICD-10-CM | POA: Diagnosis not present

## 2024-04-22 DIAGNOSIS — R0781 Pleurodynia: Secondary | ICD-10-CM | POA: Diagnosis not present

## 2024-04-22 LAB — CBC WITH DIFFERENTIAL/PLATELET
Abs Immature Granulocytes: 0.04 K/uL (ref 0.00–0.07)
Basophils Absolute: 0 K/uL (ref 0.0–0.1)
Basophils Relative: 0 %
Eosinophils Absolute: 0.1 K/uL (ref 0.0–0.5)
Eosinophils Relative: 1 %
HCT: 40.8 % (ref 39.0–52.0)
Hemoglobin: 13 g/dL (ref 13.0–17.0)
Immature Granulocytes: 0 %
Lymphocytes Relative: 7 %
Lymphs Abs: 0.6 K/uL — ABNORMAL LOW (ref 0.7–4.0)
MCH: 31.6 pg (ref 26.0–34.0)
MCHC: 31.9 g/dL (ref 30.0–36.0)
MCV: 99.3 fL (ref 80.0–100.0)
Monocytes Absolute: 1 K/uL (ref 0.1–1.0)
Monocytes Relative: 11 %
Neutro Abs: 7.3 K/uL (ref 1.7–7.7)
Neutrophils Relative %: 81 %
Platelets: 217 K/uL (ref 150–400)
RBC: 4.11 MIL/uL — ABNORMAL LOW (ref 4.22–5.81)
RDW: 12.7 % (ref 11.5–15.5)
WBC: 9.1 K/uL (ref 4.0–10.5)
nRBC: 0 % (ref 0.0–0.2)

## 2024-04-22 LAB — BASIC METABOLIC PANEL WITH GFR
Anion gap: 9 (ref 5–15)
BUN: 17 mg/dL (ref 8–23)
CO2: 28 mmol/L (ref 22–32)
Calcium: 10 mg/dL (ref 8.9–10.3)
Chloride: 104 mmol/L (ref 98–111)
Creatinine, Ser: 0.97 mg/dL (ref 0.61–1.24)
GFR, Estimated: >60 mL/min (ref >60)
Glucose, Bld: 159 mg/dL — ABNORMAL HIGH (ref 70–99)
Potassium: 4.3 mmol/L (ref 3.5–5.1)
Sodium: 141 mmol/L (ref 135–145)

## 2024-04-22 LAB — LIPASE, BLOOD: Lipase: 36 U/L (ref 11–51)

## 2024-04-22 LAB — MAGNESIUM: Magnesium: 2.1 mg/dL (ref 1.7–2.4)

## 2024-04-22 LAB — TROPONIN T, HIGH SENSITIVITY: Troponin T High Sensitivity: <15 ng/L (ref 0–19)

## 2024-04-22 MED ORDER — OXYCODONE-ACETAMINOPHEN 5-325 MG PO TABS: 1.0000 | ORAL_TABLET | Freq: Three times a day (TID) | ORAL | 0 refills | Status: AC | PRN

## 2024-04-22 MED ORDER — HEPARIN SOD (PORK) LOCK FLUSH 100 UNIT/ML IV SOLN
INTRAVENOUS | Status: AC
  Filled 2024-04-22: qty 5

## 2024-04-22 MED ORDER — OXYCODONE-ACETAMINOPHEN 5-325 MG PO TABS
1.0000 | ORAL_TABLET | Freq: Once | ORAL | Status: AC
Start: 2024-04-22 — End: 2024-04-22
  Administered 2024-04-22: 1 via ORAL
  Filled 2024-04-22: qty 1

## 2024-04-22 MED ORDER — HEPARIN SOD (PORK) LOCK FLUSH 100 UNIT/ML IV SOLN: 500.0000 [IU] | Freq: Once | INTRAVENOUS | Status: DC

## 2024-04-22 MED ORDER — IOHEXOL 350 MG/ML SOLN
75.0000 mL | Freq: Once | INTRAVENOUS | Status: AC | PRN
  Administered 2024-04-22: 75 mL via INTRAVENOUS

## 2024-04-22 NOTE — ED Triage Notes (Signed)
 Left sided rib pain starting 1 week ago. Pain does not change with movement. Pain sometimes goes away if he 'grabs the area and lets it go'. Left sided lung cancer. Has a chemo treatment tomorrow. Last treatment was on Thursday. Pain radiates throughout his chest.

## 2024-04-22 NOTE — ED Notes (Signed)
 Port heparinized and dc papers reviewed with patient.

## 2024-04-22 NOTE — Discharge Instructions (Addendum)
 Your test results today are reassuring.  I suspect that your recent pain has been from the effects of your radiation therapy.  Continue your home over-the-counter pain medications.  If you have persistent pain, a prescription for narcotic pain medication was sent to your pharmacy.  Take this only as needed.  Follow-up with your oncologist.  Return to the emergency department for any new or worsening symptoms of concern.

## 2024-04-22 NOTE — ED Provider Notes (Signed)
 [place] [facility]ERGENCY DEPARTMENT AT [facility] Provider Note   CSN: [id number] Arrival date & time: 04/22/24  1136     Patient presents with: Chest Pain   John [name] is a 78 y.o. male.    Chest Pain Patient presents for chest pain.  Medical history includes lung cancer, remote prostate cancer, HTN, arthritis.  His lung cancer was diagnosed in January.  He underwent palliative radiation earlier this year.  He completed palliative chemotherapy.  He last week, he was restarted on radiation to left lung.  During this new round of radiation, he has been experiencing left-sided chest pain.  He states that it began on the left and migrated to the right but then migrated back to the left.  He does not associate it with movements, exertion, eating, or deep inspiration.  His last radiation session was on Friday.  He attempted to tough it out over the weekend.  He is scheduled for another session tomorrow.  Last night, he had worsening of this pain which persisted throughout the night.  He has been treating his pain with over-the-counter pain medication only.  He was able to get a small amount of sleep.  When he got up and had some breakfast, he had recurrence of pain.  Currently, he states that his pain has resolved.      Prior to Admission medications   Medication Sig Start Date End Date Taking? Authorizing Provider  oxyCODONE -acetaminophen  (PERCOCET/ROXICET) 5-325 MG tablet Take 1 tablet by mouth every 8 (eight) hours as needed for up to 5 days for severe pain (pain score 7-10). 04/22/24 04/27/24 Yes Melvenia Motto, MD  albuterol  (VENTOLIN  HFA) 108 (90 Base) MCG/ACT inhaler Inhale 1-2 puffs into the lungs every 6 (six) hours as needed for wheezing or shortness of breath. 08/16/23   Ruthell Lauraine FALCON, NP  amLODipine (NORVASC) 10 MG tablet Take 10 mg by mouth daily.    [provider]  COMIRNATY syringe  04/17/23   [provider]  FLUZONE HIGH-DOSE 0.5 ML injection  04/17/23    [provider]  lidocaine -prilocaine  (EMLA ) cream Apply 1 Application topically as needed. 08/25/23   Heilingoetter, Cassandra L, PA-C  memantine  (NAMENDA ) 10 MG tablet Take 1 tablet (10 mg total) by mouth 2 (two) times daily. 12/07/23   Lanell Donald Stagger, PA-C  memantine  (NAMENDA ) 5 MG tablet Begin this prescription the first day of brain radiation. Week 1: take one tablet po qam. Week 2: take one tablet qam and qpm. Week 3: take two tablets qam, and one tablet po q pm. Week 4: take two tablets qam and qpm. Fill subsequent prescription q month. 12/07/23   Lanell Donald Stagger, PA-C  prochlorperazine  (COMPAZINE ) 10 MG tablet Take 1 tablet (10 mg total) by mouth every 6 (six) hours as needed. 08/25/23   Heilingoetter, Cassandra L, PA-C    Allergies: Patient has no known allergies.    Review of Systems  Cardiovascular:  Positive for chest pain.  All other systems reviewed and are negative.   Updated Vital Signs BP (!) 110/50 (BP Location: Right Arm)   Pulse 78   Temp 97.6 F (36.4 C) (Oral)   Resp 19   SpO2 95%   Physical Exam Vitals and nursing note reviewed.  Constitutional:      General: He is not in acute distress.    Appearance: He is well-developed. He is not ill-appearing, toxic-appearing or diaphoretic.  HENT:     Head: Normocephalic and atraumatic.  Eyes:  Conjunctiva/sclera: Conjunctivae normal.  Cardiovascular:     Rate and Rhythm: Normal rate and regular rhythm.     Heart sounds: No murmur heard. Pulmonary:     Effort: Pulmonary effort is normal. No tachypnea or respiratory distress.     Breath sounds: Decreased breath sounds present. No wheezing, rhonchi or rales.  Chest:     Chest wall: No tenderness.  Abdominal:     General: There is no abdominal bruit.     Palpations: Abdomen is soft.     Tenderness: There is no abdominal tenderness.  Musculoskeletal:        General: No swelling. Normal range of motion.     Cervical back: Normal range of  motion and neck supple.  Skin:    General: Skin is warm and dry.     Coloration: Skin is not cyanotic or pale.  Neurological:     General: No focal deficit present.     Mental Status: He is alert and oriented to person, place, and time.  Psychiatric:        Mood and Affect: Mood normal.        Behavior: Behavior normal.     (all labs ordered are listed, but only abnormal results are displayed) Labs Reviewed  BASIC METABOLIC PANEL WITH GFR - Abnormal; Notable for the following components:      Result Value   Glucose, Bld 159 (*)    All other components within normal limits  CBC WITH DIFFERENTIAL/PLATELET - Abnormal; Notable for the following components:   RBC 4.11 (*)    Lymphs Abs 0.6 (*)    All other components within normal limits  MAGNESIUM  LIPASE, BLOOD  TROPONIN T, HIGH SENSITIVITY  TROPONIN T, HIGH SENSITIVITY    EKG: None  Radiology: CT Angio Chest PE W and/or Wo Contrast Result Date: 04/22/2024 CLINICAL DATA:  One-week history of left rib pain. History of left-sided lung cancer undergoing chemotherapy EXAM: CT ANGIOGRAPHY CHEST WITH CONTRAST TECHNIQUE: Multidetector CT imaging of the chest was performed using the standard protocol during bolus administration of intravenous contrast. Multiplanar CT image reconstructions and MIPs were obtained to evaluate the vascular anatomy. RADIATION DOSE REDUCTION: This exam was performed according to the departmental dose-optimization program which includes automated exposure control, adjustment of the mA and/or kV according to patient size and/or use of iterative reconstruction technique. CONTRAST:  75mL OMNIPAQUE  IOHEXOL  350 MG/ML SOLN COMPARISON:  Nuclear medicine PET dated 03/05/2024, CT chest dated 02/14/2024 FINDINGS: Cardiovascular: Right chest wall port tip terminates in the right atrium. The study is high quality for the evaluation of pulmonary embolism. There are no filling defects in the central, lobar, segmental or  subsegmental pulmonary artery branches to suggest acute pulmonary embolism. Main pulmonary artery measures 3.5 cm. Normal heart size. No significant pericardial fluid/thickening. Coronary artery calcifications and aortic atherosclerosis. Mediastinum/Nodes: Imaged thyroid  gland without nodules meeting criteria for imaging follow-up by size. Normal esophagus. No pathologically enlarged axillary, supraclavicular, mediastinal, or hilar lymph nodes. Lungs/Pleura: The central airways are patent. Mild diffuse bronchial wall thickening. Similar moderate paraseptal emphysema. Increased irregular consolidation, volume loss, and architectural distortion with traction bronchiectasis involving the posterior perifissural left upper lobe and superior segment left lower lobe, as well as posterior right upper lobe. There is associated thickening and traction of the left major fissure. Increased patchy ground-glass opacity in the superior segment right lower lobe. 6 x 6 mm perifissural right lower lobe nodule (12:116), new. No pneumothorax. No pleural effusion. Upper abdomen: Previously hypermetabolic focus  adjacent to the left kidney has markedly decreased in size, measuring 6 mm (4:167), previously 1.9 cm. Left lower pole renal cyst. Musculoskeletal: No acute or abnormal lytic or blastic osseous lesions. Review of the MIP images confirms the above findings. IMPRESSION: 1. No evidence of pulmonary embolism. 2. Increased irregular consolidation, volume loss, and architectural distortion with traction bronchiectasis involving the posterior perifissural left upper lobe and superior segment left lower lobe, as well as posterior right upper lobe. Findings are favored to represent evolving postradiation changes. 3. Increased patchy ground-glass opacity in the superior segment right lower lobe, which may also reflect postradiation change or superimposed infection/inflammation. 4. New 6 mm perifissural right lower lobe nodule,  indeterminate. 5. Previously hypermetabolic focus adjacent to the left kidney has markedly decreased in size, measuring 6 mm, previously 1.9 cm. 6. Aortic Atherosclerosis (ICD10-I70.0) and Emphysema (ICD10-J43.9). Electronically Signed   By: Limin  Xu M.D.   On: 04/22/2024 15:04   DG Chest Portable 1 View Result Date: 04/22/2024 CLINICAL DATA:  Left sided rib pain starting 1 week ago. Pain does not change with movement. Pain sometimes goes away if he 'grabs the area and lets it go'. EXAM: PORTABLE CHEST 1 VIEW COMPARISON:  Chest x-ray 08/08/2023, PET CT 03/05/2024 FINDINGS: Accessed right chest wall Port-A-Cath with tip overlying the right atrium. The heart and mediastinal contours are within normal limits. Persistent prominent hilar vasculature. Persistent patchy airspace opacities. No pulmonary edema. No pleural effusion. No pneumothorax. No acute osseous abnormality. IMPRESSION: Persistent patchy airspace opacities. Electronically Signed   By: Morgane  Naveau M.D.   On: 04/22/2024 13:55     Procedures   Medications Ordered in the ED  oxyCODONE -acetaminophen  (PERCOCET/ROXICET) 5-325 MG per tablet 1 tablet (1 tablet Oral Given 04/22/24 1357)  iohexol  (OMNIPAQUE ) 350 MG/ML injection 75 mL (75 mLs Intravenous Contrast Given 04/22/24 1430)                                    Medical Decision Making Amount and/or Complexity of Data Reviewed Labs: ordered. Radiology: ordered.  Risk Prescription drug management.   This patient presents to the ED for concern of chest pain, this involves an extensive number of treatment options, and is a complaint that carries with it a high risk of complications and morbidity.  The differential diagnosis includes radiation side effects, PE, pneumonia, ACS   Co morbidities / Chronic conditions that complicate the patient evaluation  lung cancer, remote prostate cancer, HTN, arthritis   Additional history obtained:  Additional history obtained from  EMR External records from outside source obtained and reviewed including N/A   Lab Tests:  I Ordered, and personally interpreted labs.  The pertinent results include: Normal hemoglobin, no leukocytosis, normal kidney function, normal electrolytes, normal troponin   Imaging Studies ordered:  I ordered imaging studies including chest x-ray, CTA chest I independently visualized and interpreted imaging which showed areas of irregular consolidation and architectural distortion consistent with evolving postradiation changes. I agree with the radiologist interpretation   Cardiac Monitoring: / EKG:  The patient was maintained on a cardiac monitor.  I personally viewed and interpreted the cardiac monitored which showed an underlying rhythm of: Sinus rhythm   Problem List / ED Course / Critical interventions / Medication management  Patient presenting for left-sided chest pain.  Symptom onset does coincide with recent initiation of left-sided radiation for treatment of his lung cancer.  On arrival in the  ED, vital signs are normal.  He is well-appearing on exam.  He states that his pain is currently resolved but was persistent throughout the night and this morning.  He does have diminished breath sounds on the left side.  His current breathing is unlabored.  He is able to speak in complete sentences.  Patient was offered pain medication in anticipation of recurrence.  He was agreeable to that.  Percocet was ordered.  Workup was initiated.  Lab work was unremarkable.  Given the duration of his worsened chest pain since last night, no repeat troponin is indicated.  CT of chest showed findings consistent with postradiation changes.  I suspect this is the cause of his pain.  Patient remained pain-free while in the ED.  Patient was prescribed narcotic pain medication to take as needed.  He was discharged in stable condition. I ordered medication including cassette for analgesia Reevaluation of the patient  after these medicines showed that the patient improved I have reviewed the patients home medicines and have made adjustments as needed  Social Determinants of Health:  Lives independently     Final diagnoses:  Left-sided chest pain    ED Discharge Orders          Ordered    oxyCODONE -acetaminophen  (PERCOCET/ROXICET) 5-325 MG tablet  Every 8 hours PRN        04/22/24 1528               Melvenia Motto, MD 04/22/24 1531

## 2024-04-23 ENCOUNTER — Other Ambulatory Visit: Payer: Self-pay

## 2024-04-23 ENCOUNTER — Inpatient Hospital Stay

## 2024-04-23 ENCOUNTER — Inpatient Hospital Stay: Admitting: Nurse Practitioner

## 2024-04-23 ENCOUNTER — Encounter: Payer: Self-pay | Admitting: Internal Medicine

## 2024-04-23 ENCOUNTER — Encounter: Payer: Self-pay | Admitting: Nurse Practitioner

## 2024-04-23 ENCOUNTER — Ambulatory Visit
Admission: RE | Admit: 2024-04-23 | Discharge: 2024-04-23 | Disposition: A | Source: Ambulatory Visit | Attending: Radiation Oncology | Admitting: Radiation Oncology

## 2024-04-23 VITALS — BP 110/60 | HR 67 | Temp 98.0°F | Resp 18 | Ht 72.0 in | Wt 147.7 lb

## 2024-04-23 DIAGNOSIS — Z5112 Encounter for antineoplastic immunotherapy: Secondary | ICD-10-CM | POA: Insufficient documentation

## 2024-04-23 DIAGNOSIS — Z8551 Personal history of malignant neoplasm of bladder: Secondary | ICD-10-CM | POA: Insufficient documentation

## 2024-04-23 DIAGNOSIS — C3411 Malignant neoplasm of upper lobe, right bronchus or lung: Secondary | ICD-10-CM | POA: Diagnosis not present

## 2024-04-23 DIAGNOSIS — C3412 Malignant neoplasm of upper lobe, left bronchus or lung: Secondary | ICD-10-CM | POA: Diagnosis not present

## 2024-04-23 DIAGNOSIS — Z923 Personal history of irradiation: Secondary | ICD-10-CM | POA: Insufficient documentation

## 2024-04-23 DIAGNOSIS — Z8546 Personal history of malignant neoplasm of prostate: Secondary | ICD-10-CM | POA: Insufficient documentation

## 2024-04-23 LAB — RAD ONC ARIA SESSION SUMMARY
Course Elapsed Days: 6
Plan Fractions Treated to Date: 3
Plan Prescribed Dose Per Fraction: 10 Gy
Plan Total Fractions Prescribed: 5
Plan Total Prescribed Dose: 50 Gy
Reference Point Dosage Given to Date: 30 Gy
Reference Point Session Dosage Given: 10 Gy
Session Number: 3

## 2024-04-23 LAB — CBC WITH DIFFERENTIAL (CANCER CENTER ONLY)
Abs Immature Granulocytes: 0.05 K/uL (ref 0.00–0.07)
Basophils Absolute: 0 K/uL (ref 0.0–0.1)
Basophils Relative: 1 %
Eosinophils Absolute: 0.1 K/uL (ref 0.0–0.5)
Eosinophils Relative: 2 %
HCT: 38.4 % — ABNORMAL LOW (ref 39.0–52.0)
Hemoglobin: 12.6 g/dL — ABNORMAL LOW (ref 13.0–17.0)
Immature Granulocytes: 1 %
Lymphocytes Relative: 7 %
Lymphs Abs: 0.6 K/uL — ABNORMAL LOW (ref 0.7–4.0)
MCH: 31.9 pg (ref 26.0–34.0)
MCHC: 32.8 g/dL (ref 30.0–36.0)
MCV: 97.2 fL (ref 80.0–100.0)
Monocytes Absolute: 1.1 K/uL — ABNORMAL HIGH (ref 0.1–1.0)
Monocytes Relative: 13 %
Neutro Abs: 6.8 K/uL (ref 1.7–7.7)
Neutrophils Relative %: 76 %
Platelet Count: 194 K/uL (ref 150–400)
RBC: 3.95 MIL/uL — ABNORMAL LOW (ref 4.22–5.81)
RDW: 12.6 % (ref 11.5–15.5)
WBC Count: 8.7 K/uL (ref 4.0–10.5)
nRBC: 0 % (ref 0.0–0.2)

## 2024-04-23 LAB — CMP (CANCER CENTER ONLY)
ALT: 20 U/L (ref 0–44)
AST: 20 U/L (ref 15–41)
Albumin: 3.7 g/dL (ref 3.5–5.0)
Alkaline Phosphatase: 62 U/L (ref 38–126)
Anion gap: 4 — ABNORMAL LOW (ref 5–15)
BUN: 14 mg/dL (ref 8–23)
CO2: 32 mmol/L (ref 22–32)
Calcium: 10.3 mg/dL (ref 8.9–10.3)
Chloride: 105 mmol/L (ref 98–111)
Creatinine: 0.93 mg/dL (ref 0.61–1.24)
GFR, Estimated: >60 mL/min (ref >60)
Glucose, Bld: 117 mg/dL — ABNORMAL HIGH (ref 70–99)
Potassium: 4.4 mmol/L (ref 3.5–5.1)
Sodium: 141 mmol/L (ref 135–145)
Total Bilirubin: 0.5 mg/dL (ref 0.0–1.2)
Total Protein: 6.9 g/dL (ref 6.5–8.1)

## 2024-04-23 MED ORDER — SODIUM CHLORIDE 0.9 % IV SOLN
1500.0000 mg | Freq: Once | INTRAVENOUS | Status: AC
  Administered 2024-04-23: 1500 mg via INTRAVENOUS
  Filled 2024-04-23: qty 30

## 2024-04-23 MED ORDER — SODIUM CHLORIDE 0.9 % IV SOLN: INTRAVENOUS | Status: DC

## 2024-04-23 NOTE — Patient Instructions (Signed)
 [facility] - A DEPT OF [facility]  Discharge Instructions: Thank you for choosing [facility] to provide your oncology and hematology care.   If you have a lab appointment with the Cancer Center, please go directly to the Cancer Center and check in at the registration area.   Wear comfortable clothing and clothing appropriate for easy access to any Portacath or PICC line.   We strive to give you quality time with your provider. You may need to reschedule your appointment if you arrive late (15 or more minutes).  Arriving late affects you and other patients whose appointments are after yours.  Also, if you miss three or more appointments without notifying the office, you may be dismissed from the clinic at the provider's discretion.      For prescription refill requests, have your pharmacy contact our office and allow 72 hours for refills to be completed.    Today you received the following chemotherapy and/or immunotherapy agents: durvalumab (IMFINZI)       To help prevent nausea and vomiting after your treatment, we encourage you to take your nausea medication as directed.  BELOW ARE SYMPTOMS THAT SHOULD BE REPORTED IMMEDIATELY: *FEVER GREATER THAN 100.4 F (38 C) OR HIGHER *CHILLS OR SWEATING *NAUSEA AND VOMITING THAT IS NOT CONTROLLED WITH YOUR NAUSEA MEDICATION *UNUSUAL SHORTNESS OF BREATH *UNUSUAL BRUISING OR BLEEDING *URINARY PROBLEMS (pain or burning when urinating, or frequent urination) *BOWEL PROBLEMS (unusual diarrhea, constipation, pain near the anus) TENDERNESS IN MOUTH AND THROAT WITH OR WITHOUT PRESENCE OF ULCERS (sore throat, sores in mouth, or a toothache) UNUSUAL RASH, SWELLING OR PAIN  UNUSUAL VAGINAL DISCHARGE OR ITCHING   Items with * indicate a potential emergency and should be followed up as soon as possible or go to the Emergency Department if any problems should occur.  Please show the CHEMOTHERAPY ALERT CARD or  IMMUNOTHERAPY ALERT CARD at check-in to the Emergency Department and triage nurse.  Should you have questions after your visit or need to cancel or reschedule your appointment, please contact [facility] - A DEPT [facility]  Dept: [phone]  and follow the prompts.  Office hours are 8:00 a.m. to 4:30 p.m. Monday - Friday. Please note that voicemails left after 4:00 p.m. may not be returned until the following business day.  We are closed weekends and major holidays. You have access to a nurse at all times for urgent questions. Please call the main number to the clinic Dept: [phone] and follow the prompts.   For any non-urgent questions, you may also contact your provider using MyChart. We now offer e-Visits for anyone 39 and older to request care online for non-urgent symptoms. For details visit [email or web address].   Also download the MyChart app! Go to the app store, search "MyChart", open the app, select [place], and log in with your MyChart username and password.

## 2024-04-23 NOTE — Progress Notes (Signed)
 [facility]   [phone] [phone]    Patient Care Team: Practice, [facility] Family as PCP - General Lavona Agent, MD as PCP - Cardiology (Cardiology) Prentis Duwaine BROCKS, RN as Oncology Nurse Navigator   CHIEF COMPLAINT: Follow-up lung cancer  CURRENT THERAPY: Maintenance Durvalumab  every 4 weeks  INTERVAL HISTORY John [name] returns for follow-up and treatment, last seen by Dr. Gatha 03/26/2024 with another cycle of maintenance Durvalumab .  He was seen by radiation oncology for oligo left kidney met and began SBRT (10/7, 10/9).  He presented to the ED yesterday with left-sided chest pain.  NSAIDs at home were not effective.  CXR showed persistent patchy airspace opacities, CTA negative for PE.  Symptoms felt to be evolving radiation change.  He was discharged with pain medication and presents for follow-up and treatment. Denies pain currently. Denies cough, dyspnea, fever/chills. He is eating and drinking with good mobility at home.   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   worked up with cardiology dr hochrein and f/u prn   Nocturia 02/11/2021   Prostate CA (HCC) 2024   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   Wears glasses 02/11/2021     Past Surgical History:  Procedure  Laterality Date   ABDOMINAL HERNIA REPAIR  1990's   BRONCHIAL BIOPSY  08/08/2023   Procedure: BRONCHIAL BIOPSIES;  Surgeon: Shelah Lamar RAMAN, MD;  Location: [facility] ENDOSCOPY;  Service: Pulmonary;;   BRONCHIAL BRUSHINGS  08/08/2023   Procedure: BRONCHIAL BRUSHINGS;  Surgeon: Shelah Lamar RAMAN, MD;  Location: [facility] ENDOSCOPY;  Service: Pulmonary;;   BRONCHIAL NEEDLE ASPIRATION BIOPSY  08/08/2023   Procedure: BRONCHIAL NEEDLE ASPIRATION BIOPSIES;  Surgeon: Shelah Lamar RAMAN, MD;  Location: [facility] ENDOSCOPY;  Service: Pulmonary;;   CYSTOSCOPY W/ RETROGRADES N/A 11/03/2015   Procedure: CYSTOSCOPY WITH RETROGRADE PYELOGRAM;  Surgeon: Belvie LITTIE sherrilee, MD;  Location: [facility];  Service: Urology;  Laterality: N/A;   CYSTOSCOPY W/ RETROGRADES Left 01/09/2016   Procedure: CYSTOSCOPY WITH LEFT RETROGRADE PYELOGRAM;  Surgeon: Belvie LITTIE sherrilee, MD;  Location: [facility];  Service: Urology;  Laterality: Left;   CYSTOSCOPY W/ RETROGRADES Bilateral 05/28/2016   Procedure: CYSTOSCOPY WITH RETROGRADE PYELOGRAM;  Surgeon: Belvie LITTIE sherrilee, MD;  Location: [facility];  Service: Urology;  Laterality: Bilateral;   CYSTOSCOPY W/ RETROGRADES Bilateral 02/17/2021   Procedure: CYSTOSCOPY WITH RETROGRADE PYELOGRAM;  Surgeon: Rosalind Zachary NOVAK, MD;  Location: [facility];  Service: Urology;  Laterality: Bilateral;   CYSTOSCOPY W/ URETERAL STENT REMOVAL Left 01/09/2016   Procedure: CYSTOSCOPY WITH LEFT STENT EXCHANGE;  Surgeon: Belvie LITTIE sherrilee, MD;  Location: [facility];  Service: Urology;  Laterality: Left;   CYSTOSCOPY WITH BIOPSY  N/A 05/28/2016   Procedure: CYSTOSCOPY WITH BIOPSY;  Surgeon: Belvie LITTIE Clara, MD;  Location: [facility];  Service: Urology;  Laterality: N/A;   CYSTOSCOPY WITH URETHRAL DILATATION N/A 05/28/2016   Procedure: CYSTOSCOPY WITH URETHRAL DILATATION;  Surgeon: Belvie LITTIE Clara, MD;  Location: [facility];  Service: Urology;  Laterality: N/A;   HEMOSTASIS CONTROL  08/08/2023   Procedure: HEMOSTASIS CONTROL;  Surgeon: Shelah Lamar RAMAN, MD;  Location: [facility] ENDOSCOPY;  Service: Pulmonary;;   IR IMAGING GUIDED PORT INSERTION  09/19/2023   PROSTATE BIOPSY  05/08/12   Adenocarcinoma   RADIOACTIVE SEED IMPLANT  08/01/2012   Procedure: RADIOACTIVE SEED IMPLANT;  Surgeon: Thomasine Oiler, MD;  Location: [facility];  Service: Urology;  Laterality: N/A;  73 seeds implanted no seeds found in bladder   TRANSURETHRAL RESECTION OF BLADDER TUMOR N/A 11/03/2015   Procedure: TRANSURETHRAL RESECTION OF BLADDER TUMOR (TURBT)WITH INSERTION STENT;  Surgeon: Belvie LITTIE Clara, MD;  Location: [facility];  Service: Urology;  Laterality: N/A;   TRANSURETHRAL RESECTION OF BLADDER TUMOR N/A 02/17/2021   Procedure: TRANSURETHRAL RESECTION OF BLADDER TUMOR (TURBT);  Surgeon: Rosalind Zachary NOVAK, MD;  Location: [facility];  Service: Urology;  Laterality: N/A;  30 MINS   TRANSURETHRAL RESECTION OF BLADDER TUMOR WITH GYRUS (TURBT-GYRUS) N/A 01/09/2016   Procedure: TRANSURETHRAL RESECTION OF BLADDER TUMOR WITH GYRUS (TURBT-GYRUS);  Surgeon: Belvie LITTIE Clara, MD;  Location: [facility];  Service: Urology;  Laterality: N/A;   VIDEO BRONCHOSCOPY WITH RADIAL ENDOBRONCHIAL ULTRASOUND  08/08/2023   Procedure: VIDEO BRONCHOSCOPY WITH RADIAL ENDOBRONCHIAL ULTRASOUND;  Surgeon: Shelah Lamar RAMAN, MD;  Location: [facility] ENDOSCOPY;  Service: Pulmonary;;     Outpatient Encounter Medications as of 04/23/2024  Medication Sig   amLODipine (NORVASC) 10 MG tablet Take 10 mg by mouth daily.   COMIRNATY syringe    FLUZONE HIGH-DOSE 0.5 ML injection    lidocaine -prilocaine  (EMLA ) cream Apply 1 Application topically as needed.   memantine  (NAMENDA ) 10 MG tablet Take 1 tablet (10 mg total) by mouth 2 (two) times daily.   oxyCODONE -acetaminophen  (PERCOCET/ROXICET) 5-325 MG tablet Take 1 tablet by  mouth every 8 (eight) hours as needed for up to 5 days for severe pain (pain score 7-10).   albuterol  (VENTOLIN  HFA) 108 (90 Base) MCG/ACT inhaler Inhale 1-2 puffs into the lungs every 6 (six) hours as needed for wheezing or shortness of breath. (Patient not taking: Reported on 04/23/2024)   memantine  (NAMENDA ) 5 MG tablet Begin this prescription the first day of brain radiation. Week 1: take one tablet po qam. Week 2: take one tablet qam and qpm. Week 3: take two tablets qam, and one tablet po q pm. Week 4: take two tablets qam and qpm. Fill subsequent prescription q month.   prochlorperazine  (COMPAZINE ) 10 MG tablet Take 1 tablet (10 mg total) by mouth every 6 (six) hours as needed. (Patient not taking: Reported on 04/23/2024)   No facility-administered encounter medications on file as of 04/23/2024.     Today's Vitals   04/23/24 1000 04/23/24 1011  BP:  110/60  Pulse:  67  Resp:  18  Temp:  98 F (36.7 C)  TempSrc:  Temporal  SpO2:  97%  Weight:  147 lb 11.2 oz (67 kg)  Height:  6' (1.829 m)  PainSc: 0-No pain    Body mass index is 20.03 kg/m.   ECOG PERFORMANCE STATUS: 1 - Symptomatic but completely ambulatory  PHYSICAL EXAM GENERAL:alert,  no distress and comfortable SKIN: no rash  EYES: sclera clear NECK: without mass LYMPH:  no palpable cervical or supraclavicular lymphadenopathy  LUNGS: slightly decreased left lung base, otherwise clear with normal breathing effort HEART: regular rate & rhythm, no lower extremity edema NEURO: alert & oriented x 3 with fluent speech, no focal motor deficits PAC without erythema    CBC    Latest Ref Rng & Units 04/23/2024    9:50 AM 04/22/2024    1:16 PM 03/26/2024   10:14 AM  CBC  WBC 4.0 - 10.5 K/uL 8.7  9.1  7.4   Hemoglobin 13.0 - 17.0 g/dL 87.3  86.9  87.6   Hematocrit 39.0 - 52.0 % 38.4  40.8  37.1   Platelets 150 - 400 K/uL 194  217  169       CMP     Latest Ref Rng & Units 04/23/2024    9:50 AM 04/22/2024    1:16 PM  03/26/2024   10:14 AM  CMP  Glucose 70 - 99 mg/dL 882  840  87   BUN 8 - 23 mg/dL 14  17  16    Creatinine 0.61 - 1.24 mg/dL 9.06  9.02  9.05   Sodium 135 - 145 mmol/L 141  141  144   Potassium 3.5 - 5.1 mmol/L 4.4  4.3  4.2   Chloride 98 - 111 mmol/L 105  104  109   CO2 22 - 32 mmol/L 32  28  31   Calcium 8.9 - 10.3 mg/dL 89.6  89.9  9.6   Total Protein 6.5 - 8.1 g/dL 6.9   6.7   Total Bilirubin 0.0 - 1.2 mg/dL 0.5   0.5   Alkaline Phos 38 - 126 U/L 62   66   AST 15 - 41 U/L 20   19   ALT 0 - 44 U/L 20   20       ASSESSMENT & PLAN:  1) 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  Presented with a large left hilar/mediastinal mass, multiple bilateral pulmonary nodules, prevascular lymph node, and subcutaneous nodules consistent with metastatic disease.  He was diagnosed in January 2025. - S/p Palliative systemic chemotherapy with carboplatin  for an AUC of 5 on day 1 and etoposide  100 mg/m2 on days 1, 2 and 3, and Imfinzi  1500 mg IV every 3 weeks with neulsta support. First dose on 09/06/23 x4 cycles completing 11/10/23   -S/p palliative course of radiation to the left lung completed 09/19/23 and RUL was treated with a course of stereotactic body radiation treatment by Dr. Dewey on 12/07/23 to the synchronous stage I NSCLC -S/p WBR under the care of Dr. Dewey, last day on 12/26/23 -Began maintenance durvalumab  q4 weeks starting 11/28/23 -Currently undergoing SBRT to oligo left renal met startign 10/7  3) history of prostate cancer in 2014 4) history of urothelial carcinoma in 2017    Disposition:  John [name] appears stable. Tolerating maintenance durvalumab  well without significant side effects. Able to recover and function with adequate PS. There is no clinical evidence of disease progression. Labs adequate to proceed with durvalumab  today, no dose modifications, and continue q4 weeks.   I reviewed his ED  course for left chest wall pain, work up was unremarkable, symptoms felt to be related to evolving radiation changes. Images suggested possible RLL infection/inflammation. He has no clinical signs of infection.  Will hold antibiotics for  now and continue supportive care.  I reviewed the plan with Dr. Sherrod.  Follow-up in 4 weeks with next cycle of Durvalumab , or sooner if needed.   All questions were answered. The patient knows to call the clinic with any problems, questions or concerns. No barriers to learning were detected.  Roselyne Stalnaker K Jaymarie Yeakel, NP 04/23/2024

## 2024-04-24 ENCOUNTER — Telehealth: Payer: Self-pay | Admitting: Internal Medicine

## 2024-04-24 NOTE — Telephone Encounter (Signed)
 I called to inform John [name] of his 12/8 appointments. His VM box is full and I was unable to leave a message.

## 2024-04-25 ENCOUNTER — Other Ambulatory Visit: Payer: Self-pay

## 2024-04-25 ENCOUNTER — Ambulatory Visit
Admission: RE | Admit: 2024-04-25 | Discharge: 2024-04-25 | Disposition: A | Source: Ambulatory Visit | Attending: Radiation Oncology | Admitting: Radiation Oncology

## 2024-04-25 DIAGNOSIS — Z5112 Encounter for antineoplastic immunotherapy: Secondary | ICD-10-CM | POA: Diagnosis not present

## 2024-04-25 LAB — RAD ONC ARIA SESSION SUMMARY
Course Elapsed Days: 8
Plan Fractions Treated to Date: 4
Plan Prescribed Dose Per Fraction: 10 Gy
Plan Total Fractions Prescribed: 5
Plan Total Prescribed Dose: 50 Gy
Reference Point Dosage Given to Date: 40 Gy
Reference Point Session Dosage Given: 10 Gy
Session Number: 4

## 2024-04-27 ENCOUNTER — Ambulatory Visit
Admission: RE | Admit: 2024-04-27 | Discharge: 2024-04-27 | Disposition: A | Discharge status: Home / Self Care | Source: Ambulatory Visit | Attending: Radiation Oncology | Admitting: Radiation Oncology

## 2024-04-27 ENCOUNTER — Other Ambulatory Visit: Payer: Self-pay

## 2024-04-27 DIAGNOSIS — Z5112 Encounter for antineoplastic immunotherapy: Secondary | ICD-10-CM | POA: Diagnosis not present

## 2024-04-27 DIAGNOSIS — Z510 Encounter for antineoplastic radiation therapy: Secondary | ICD-10-CM | POA: Diagnosis not present

## 2024-04-27 DIAGNOSIS — C3411 Malignant neoplasm of upper lobe, right bronchus or lung: Secondary | ICD-10-CM | POA: Diagnosis not present

## 2024-04-27 DIAGNOSIS — C7989 Secondary malignant neoplasm of other specified sites: Secondary | ICD-10-CM | POA: Diagnosis not present

## 2024-04-27 LAB — RAD ONC ARIA SESSION SUMMARY
Course Elapsed Days: 10
Plan Fractions Treated to Date: 5
Plan Prescribed Dose Per Fraction: 10 Gy
Plan Total Fractions Prescribed: 5
Plan Total Prescribed Dose: 50 Gy
Reference Point Dosage Given to Date: 50 Gy
Reference Point Session Dosage Given: 10 Gy
Session Number: 5

## 2024-04-29 NOTE — Radiation Completion Notes (Addendum)
  Radiation Oncology         [phone] ________________________________  Name: John [name] MRN: [id number]  Date of Service: 04/27/2024  DOB: [birth year]  End of Treatment Note  Diagnosis: Progressive metastatic Limited Small cell carcinoma involving the LUL and synchronous Stage IA3, cT1cN0M0, NSCLC, adenocarcinoma of the RUL   Intent: Palliative     ==========DELIVERED PLANS==========  First Treatment Date: 2024-04-17 Last Treatment Date: 2024-04-27   Plan Name: Abd_SBRT Site: Abdomen, soft tissue nodule adjacent to the left kidney  Technique: SBRT/SRT-IMRT Mode: Photon Dose Per Fraction: 10 Gy Prescribed Dose (Delivered / Prescribed): 50 Gy / 50 Gy Prescribed Fxs (Delivered / Prescribed): 5 / 5     ==========ON TREATMENT VISIT DATES========== 2024-04-17, 2024-04-19, 2024-04-23, 2024-04-25, 2024-04-27, 2024-04-27    See weekly On Treatment Notes in Epic for details in the Media tab (listed as Progress notes on the On Treatment Visit Dates listed above). The patient tolerated radiation. He developed fatigue and anticipated skin changes in the treatment field.   The patient will receive a call in about one month from the radiation oncology department. He will follow up with Dr. Sherrod as well and be followed with repeat MRI brain which he is due for. I'll ask our team to reach out to schedule this.     Donald KYM Husband, PAC

## 2024-05-03 ENCOUNTER — Other Ambulatory Visit: Payer: Self-pay | Admitting: Radiation Oncology

## 2024-05-03 NOTE — Progress Notes (Incomplete)
 Follow up call to receive results from MRI brain on  Pain/headache/vision?

## 2024-05-09 ENCOUNTER — Telehealth: Payer: Self-pay | Admitting: *Deleted

## 2024-05-09 NOTE — Telephone Encounter (Signed)
 CALLED PATIENT'S SISTER-BRENDA RUSSELL TO HAVE HER TO RELAY A MESSAGE TO HER BROTHER TO CALL ME, SPOKE WITH PATIENT'S SISTER- BRENDA RUSSELL AND SHE WILL CONTACT HER BROTHER TO CALL ME

## 2024-05-09 NOTE — Telephone Encounter (Signed)
 CALLED PATIENT TO INFORM OF MRI FOR 05-23-24- ARRIVAL TIME- 10:20 AM @ [facility], PATIENT TO BRING PHOTO ID, INSURANCE CARD AND PATIENT NOT TO WEAR ANYTHING WITH METAL, PATIENT TO BE CALLED ON 05-29-24 @ 8:30 AM WITH ALISON PERKINS, SPOKE WITH PATIENT AND HE IS AWARE OF THESE APPTS. AND THE INSTRUCTIONS

## 2024-05-10 ENCOUNTER — Other Ambulatory Visit: Payer: Self-pay

## 2024-05-12 ENCOUNTER — Emergency Department (HOSPITAL_COMMUNITY)

## 2024-05-12 ENCOUNTER — Other Ambulatory Visit: Payer: Self-pay

## 2024-05-12 ENCOUNTER — Encounter (HOSPITAL_COMMUNITY): Payer: Self-pay | Admitting: Emergency Medicine

## 2024-05-12 ENCOUNTER — Emergency Department (HOSPITAL_COMMUNITY)
Admission: EM | Admit: 2024-05-12 | Discharge: 2024-05-12 | Disposition: A | Discharge status: Home / Self Care | Attending: Emergency Medicine | Admitting: Emergency Medicine

## 2024-05-12 DIAGNOSIS — I2699 Other pulmonary embolism without acute cor pulmonale: Secondary | ICD-10-CM | POA: Insufficient documentation

## 2024-05-12 DIAGNOSIS — R0602 Shortness of breath: Secondary | ICD-10-CM | POA: Diagnosis present

## 2024-05-12 DIAGNOSIS — Z85118 Personal history of other malignant neoplasm of bronchus and lung: Secondary | ICD-10-CM | POA: Diagnosis not present

## 2024-05-12 DIAGNOSIS — J441 Chronic obstructive pulmonary disease with (acute) exacerbation: Secondary | ICD-10-CM | POA: Diagnosis not present

## 2024-05-12 DIAGNOSIS — Z7901 Long term (current) use of anticoagulants: Secondary | ICD-10-CM | POA: Insufficient documentation

## 2024-05-12 DIAGNOSIS — C3490 Malignant neoplasm of unspecified part of unspecified bronchus or lung: Secondary | ICD-10-CM

## 2024-05-12 LAB — CBC
HCT: 38.6 % — ABNORMAL LOW (ref 39.0–52.0)
Hemoglobin: 12.3 g/dL — ABNORMAL LOW (ref 13.0–17.0)
MCH: 31.1 pg (ref 26.0–34.0)
MCHC: 31.9 g/dL (ref 30.0–36.0)
MCV: 97.5 fL (ref 80.0–100.0)
Platelets: 171 K/uL (ref 150–400)
RBC: 3.96 MIL/uL — ABNORMAL LOW (ref 4.22–5.81)
RDW: 12.4 % (ref 11.5–15.5)
WBC: 9.1 K/uL (ref 4.0–10.5)
nRBC: 0 % (ref 0.0–0.2)

## 2024-05-12 LAB — RESP PANEL BY RT-PCR (RSV, FLU A&B, COVID)  RVPGX2
Influenza A by PCR: NEGATIVE
Influenza B by PCR: NEGATIVE
Resp Syncytial Virus by PCR: NEGATIVE
SARS Coronavirus 2 by RT PCR: NEGATIVE

## 2024-05-12 LAB — BASIC METABOLIC PANEL WITH GFR
Anion gap: 8 (ref 5–15)
BUN: 12 mg/dL (ref 8–23)
CO2: 27 mmol/L (ref 22–32)
Calcium: 9.9 mg/dL (ref 8.9–10.3)
Chloride: 104 mmol/L (ref 98–111)
Creatinine, Ser: 0.77 mg/dL (ref 0.61–1.24)
GFR, Estimated: >60 mL/min (ref >60)
Glucose, Bld: 97 mg/dL (ref 70–99)
Potassium: 4.3 mmol/L (ref 3.5–5.1)
Sodium: 140 mmol/L (ref 135–145)

## 2024-05-12 LAB — PRO BRAIN NATRIURETIC PEPTIDE: Pro Brain Natriuretic Peptide: 113 pg/mL (ref <300.0)

## 2024-05-12 LAB — TROPONIN T, HIGH SENSITIVITY
Troponin T High Sensitivity: <15 ng/L (ref 0–19)
Troponin T High Sensitivity: <15 ng/L (ref 0–19)

## 2024-05-12 MED ORDER — ALBUTEROL SULFATE HFA 108 (90 BASE) MCG/ACT IN AERS: 1.0000 | INHALATION_SPRAY | Freq: Four times a day (QID) | RESPIRATORY_TRACT | 2 refills | Status: AC | PRN

## 2024-05-12 MED ORDER — PREDNISONE 20 MG PO TABS: 40.0000 mg | ORAL_TABLET | Freq: Every day | ORAL | 0 refills | Status: AC

## 2024-05-12 MED ORDER — IPRATROPIUM-ALBUTEROL 0.5-2.5 (3) MG/3ML IN SOLN
3.0000 mL | Freq: Once | RESPIRATORY_TRACT | Status: AC
  Administered 2024-05-12: 3 mL via RESPIRATORY_TRACT
  Filled 2024-05-12: qty 3

## 2024-05-12 MED ORDER — APIXABAN 5 MG PO TABS
10.0000 mg | ORAL_TABLET | ORAL | Status: AC
  Administered 2024-05-12: 10 mg via ORAL
  Filled 2024-05-12: qty 2

## 2024-05-12 MED ORDER — IOHEXOL 350 MG/ML SOLN
75.0000 mL | Freq: Once | INTRAVENOUS | Status: AC | PRN
  Administered 2024-05-12: 75 mL via INTRAVENOUS

## 2024-05-12 MED ORDER — METHYLPREDNISOLONE SODIUM SUCC 125 MG IJ SOLR
125.0000 mg | INTRAMUSCULAR | Status: AC
  Administered 2024-05-12: 125 mg via INTRAVENOUS
  Filled 2024-05-12: qty 2

## 2024-05-12 MED ORDER — APIXABAN (ELIQUIS) VTE STARTER PACK (10MG AND 5MG): ORAL_TABLET | ORAL | 0 refills | Status: AC

## 2024-05-12 NOTE — ED Triage Notes (Signed)
 BIB EMS from home.  Increasing shob the past few days. Some cough.  NSR on 12 lead.  Under treatment for lung CA  No home O2.  VSS  108/50, 80 HR, 98% on RA.

## 2024-05-12 NOTE — ED Provider Notes (Signed)
 [place] EMERGENCY DEPARTMENT AT [facility] Provider Note   CSN: [id number] Arrival date & time: 05/12/24  8367     Patient presents with: Shortness of Breath   John [name] is a 78 y.o. male.   78 year old male with a history of lung cancer status postradiation who presents emergency department with cough, shortness of breath, and chest pain.  Last radiation treatment several weeks ago with associated cough.  Productive of white sputum.  Also says she has had worsening shortness of breath.  She is having chest pain that is worse when he breathes on both sides of his chest.  Was seen here on 10/12 and had a CTA that showed postradiation changes with possible superimposed infection or inflammation and was discharged home.  There is no PE at that point in time.  No leg swelling.  Denies any hemoptysis.  Not on any blood thinners.  Denies fevers or chills.       Prior to Admission medications   Medication Sig Start Date End Date Taking? Authorizing Provider  APIXABAN (ELIQUIS) VTE STARTER PACK (10MG  AND 5MG ) Take as directed on package: start with two-5mg  tablets twice daily for 7 days. On day 8, switch to one-5mg  tablet twice daily. 05/12/24  Yes Yolande Lamar BROCKS, MD  predniSONE (DELTASONE) 20 MG tablet Take 2 tablets (40 mg total) by mouth daily for 4 days. 05/12/24 05/16/24 Yes Yolande Lamar BROCKS, MD  albuterol  (VENTOLIN  HFA) 108 [phone] Base) MCG/ACT inhaler Inhale 1-2 puffs into the lungs every 6 (six) hours as needed for wheezing or shortness of breath. 05/12/24   Yolande Lamar BROCKS, MD  amLODipine (NORVASC) 10 MG tablet Take 10 mg by mouth daily.    [provider]  COMIRNATY syringe  04/17/23   [provider]  FLUZONE HIGH-DOSE 0.5 ML injection  04/17/23   [provider]  lidocaine -prilocaine  (EMLA ) cream Apply 1 Application topically as needed. 08/25/23   Heilingoetter, Cassandra L, PA-C  memantine  (NAMENDA ) 10 MG tablet Take 1 tablet (10 mg total)  by mouth 2 (two) times daily. 12/07/23   Lanell Donald Stagger, PA-C  memantine  (NAMENDA ) 5 MG tablet Begin this prescription the first day of brain radiation. Week 1: take one tablet po qam. Week 2: take one tablet qam and qpm. Week 3: take two tablets qam, and one tablet po q pm. Week 4: take two tablets qam and qpm. Fill subsequent prescription q month. 12/07/23   Lanell Donald Stagger, PA-C  prochlorperazine  (COMPAZINE ) 10 MG tablet Take 1 tablet (10 mg total) by mouth every 6 (six) hours as needed. Patient not taking: Reported on 04/23/2024 08/25/23   Heilingoetter, Cassandra L, PA-C    Allergies: Patient has no known allergies.    Review of Systems  Updated Vital Signs BP 134/62   Pulse 89   Temp 98.2 F (36.8 C) (Oral)   Resp 18   SpO2 95%   Physical Exam Vitals and nursing note reviewed.  Constitutional:      General: He is not in acute distress.    Appearance: He is well-developed.  HENT:     Head: Normocephalic and atraumatic.     Right Ear: External ear normal.     Left Ear: External ear normal.     Nose: Nose normal.  Eyes:     Extraocular Movements: Extraocular movements intact.     Conjunctiva/sclera: Conjunctivae normal.     Pupils: Pupils are equal, round, and reactive to light.  Cardiovascular:  Rate and Rhythm: Normal rate and regular rhythm.     Heart sounds: Normal heart sounds.     Comments: Chest pain not reproducible.  No overlying rashes. Pulmonary:     Effort: Pulmonary effort is normal. No respiratory distress.     Breath sounds: Rhonchi present.  Musculoskeletal:     Cervical back: Normal range of motion and neck supple.     Right lower leg: No edema.     Left lower leg: No edema.  Skin:    General: Skin is warm and dry.  Neurological:     Mental Status: He is alert. Mental status is at baseline.  Psychiatric:        Mood and Affect: Mood normal.        Behavior: Behavior normal.     (all labs ordered are listed, but only abnormal  results are displayed) Labs Reviewed  CBC - Abnormal; Notable for the following components:      Result Value   RBC 3.96 (*)    Hemoglobin 12.3 (*)    HCT 38.6 (*)    All other components within normal limits  RESP PANEL BY RT-PCR (RSV, FLU A&B, COVID)  [id number]  BASIC METABOLIC PANEL WITH GFR  PRO BRAIN NATRIURETIC PEPTIDE  TROPONIN T, HIGH SENSITIVITY  TROPONIN T, HIGH SENSITIVITY    EKG: EKG Interpretation Date/Time:  Saturday May 12 2024 16:50:36 EDT Ventricular Rate:  80 PR Interval:  204 QRS Duration:  85 QT Interval:  359 QTC Calculation: 415 R Axis:   1  Text Interpretation: Sinus or ectopic atrial rhythm Consider left ventricular hypertrophy Confirmed by Yolande Charleston [phone]) on 05/12/2024 7:30:02 PM  Radiology: CT Angio Chest PE W and/or Wo Contrast Result Date: 05/12/2024 EXAM: CTA of the Chest with and without contrast for PE 05/12/2024 06:42:11 PM TECHNIQUE: CTA of the chest was performed after the administration of 75 mL of iohexol  (OMNIPAQUE ) 350 MG/ML injection. Multiplanar reformatted images are provided for review. MIP images are provided for review. Automated exposure control, iterative reconstruction, and/or weight based adjustment of the mA/kV was utilized to reduce the radiation dose to as low as reasonably achievable. COMPARISON: 04/22/2024 CLINICAL HISTORY: Pulmonary embolism (PE) suspected, high prob. FINDINGS: PULMONARY ARTERIES: Pulmonary arteries are adequately opacified for evaluation. Single small filling defect noted in a right upper lobe pulmonary arterial branch, partially occlusive compatible with small pulmonary embolus. Main pulmonary artery is normal in caliber. MEDIASTINUM: The heart is mildly enlarged. Coronary artery and aortic atherosclerosis are present. No acute abnormality of the thoracic aorta. LYMPH NODES: No mediastinal, hilar or axillary lymphadenopathy. LUNGS AND PLEURA: Moderate centrilobular and paraseptal emphysema. Airspace  opacity with bronchiectasis and architectural distortion again noted in the periventricular left upper lobe and adjacent superior segment of the left lower lobe as well as posterior right upper lobe. These findings are unchanged since prior study. No acute confluent airspace opacities. No pulmonary edema. No pleural effusion or pneumothorax. UPPER ABDOMEN: Limited images of the upper abdomen are unremarkable. SOFT TISSUES AND BONES: No acute bone or soft tissue abnormality. IMPRESSION: 1. Small pulmonary embolus in a right upper lobe pulmonary arterial branch. Given its small size and that it is nonocclusive, clinical significance is questioned. 2. Moderate centrilobular and paraseptal emphysema. 3. Chronic airspace opacity with bronchiectasis and architectural distortion in the left upper lobe, adjacent superior segment of the left lower lobe, and posterior right upper lobe, unchanged since prior study. Electronically signed by: Franky Crease MD 05/12/2024 06:55 PM EDT RP  Workstation: [id number]   DG Chest Portable 1 View Result Date: 05/12/2024 EXAM: 1 VIEW(S) XRAY OF THE CHEST 05/12/2024 05:05:18 PM COMPARISON: 04/22/2024 CLINICAL HISTORY: [facility] FINDINGS: LINES, TUBES AND DEVICES: Stable right internal jugular port-a-cath. 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 Elevated left hemidiaphragm is noted with possible minimal left basilar atelectasis or effusion. No pulmonary edema. No pneumothorax. HEART AND MEDIASTINUM: No acute abnormality of the cardiac and mediastinal silhouettes. BONES AND SOFT TISSUES: No acute osseous abnormality. IMPRESSION: 1. Stable bilateral perihilar opacities, most consistent with radiation fibrosis. 2. Elevated left hemidiaphragm with possible minimal left basilar atelectasis or effusion. 3. Stable right internal jugular port-a-cath. Electronically signed by: Lynwood Seip MD 05/12/2024 05:13 PM EDT RP Workstation: [id number]      Procedures   Medications Ordered in the ED  methylPREDNISolone sodium succinate (SOLU-MEDROL) 125 mg/2 mL injection 125 mg (125 mg Intravenous Given 05/12/24 1737)  ipratropium-albuterol  (DUONEB) 0.5-2.5 (3) MG/3ML nebulizer solution 3 mL (3 mLs Nebulization Given 05/12/24 1737)  iohexol  (OMNIPAQUE ) 350 MG/ML injection 75 mL (75 mLs Intravenous Contrast Given 05/12/24 1842)  apixaban (ELIQUIS) tablet 10 mg (10 mg Oral Given 05/12/24 1954)                                    Medical Decision Making Amount and/or Complexity of Data Reviewed Labs: ordered. Radiology: ordered.  Risk Prescription drug management.   John [name] is a 78 year old male with a history of lung cancer status postradiation who presents emergency department with cough, shortness of breath, and chest pain.   Initial Ddx:  URI, pneumonia, PE, COPD exacerbation, postobstructive pneumonia, malignancy  MDM/Course:  Patient presents to the emergency department with shortness of breath and chest pain.  Also having a cough.  No other infectious symptoms.  On exam does have some rhonchi.  With his history of COPD was given steroids and DuoNebs and upon re-evaluation was feeling much better.  Had a CTA with his history of cancer which did show a very small pulmonary embolism.  Appears to be symptomatic from it since having pain in his right upper lobe approximately where the PE is.  With his history of cancer it may propagate so we will go ahead and start him on anticoagulation at this point in time.  He is currently on room air and hemodynamically stable.  No signs of right heart strain and his troponin and BNP are normal.  Patient is requesting go home which I feel is reasonable.  Sent home with albuterol , prednisone, and Eliquis.  Follow-up with primary doctor in several days   This patient presents to the ED for concern of complaints listed in HPI, this involves an extensive number of treatment options, and is a  complaint that carries with it a high risk of complications and morbidity. Disposition including potential need for admission considered.   Dispo: DC Home. Return precautions discussed including, but not limited to, those listed in the AVS. Allowed pt time to ask questions which were answered fully prior to dc.  Additional history obtained from family Records reviewed Outpatient Clinic Notes The following labs were independently interpreted: Chemistry and show no acute abnormality I independently reviewed the following imaging with scope of interpretation limited to determining acute life threatening conditions related to emergency care: Chest x-ray and agree with the radiologist interpretation with the following exceptions: none I personally reviewed and interpreted  cardiac monitoring: normal sinus rhythm  I personally reviewed and interpreted the pt's EKG: see above for interpretation  I have reviewed the patients home medications and made adjustments as needed Social Determinants of health:  Geriatric  Portions of this note were generated with Scientist, clinical (histocompatibility and immunogenetics). Dictation errors may occur despite best attempts at proofreading.     Final diagnoses:  COPD exacerbation (HCC)  Other acute pulmonary embolism without acute cor pulmonale [facility])    ED Discharge Orders          Ordered    APIXABAN (ELIQUIS) VTE STARTER PACK (10MG  AND 5MG )       Note to Pharmacy: If starter pack unavailable, substitute with seventy-four 5 mg apixaban tabs following the above SIG directions.   05/12/24 2027    predniSONE (DELTASONE) 20 MG tablet  Daily        05/12/24 2027    albuterol  (VENTOLIN  HFA) 108 (90 Base) MCG/ACT inhaler  Every 6 hours PRN        05/12/24 2028               Yolande Lamar BROCKS, MD 05/12/24 2142

## 2024-05-12 NOTE — Discharge Instructions (Addendum)
 You were seen for your bronchitis in the emergency department.   At home, please take the prednisone and use your inhalers prescribed.  You are also found to have very small blood clot in your lungs so please start taking the Eliquis we have prescribed you.  Check your MyChart online for the results of any tests that had not resulted by the time you left the emergency department.   Follow-up with your primary doctor in 2-3 days regarding your visit.    Return immediately to the emergency department if you experience any of the following: Chest pain, worsening difficulty breathing, blood in your stools, severe headaches, or any other concerning symptoms.    Thank you for visiting our Emergency Department. It was a pleasure taking care of you today.

## 2024-05-14 ENCOUNTER — Ambulatory Visit: Admitting: Radiation Oncology

## 2024-05-14 ENCOUNTER — Ambulatory Visit: Payer: Self-pay | Admitting: Radiation Oncology

## 2024-05-15 NOTE — Progress Notes (Signed)
 Follow up call to discuss brain MRI scan results from 05/23/2024.  Pain/Headache/Vision?  IMPRESSION: 1. No acute intracranial abnormality. 2. No mass or abnormal enhancement.

## 2024-05-16 ENCOUNTER — Encounter: Payer: Self-pay | Admitting: Internal Medicine

## 2024-05-21 ENCOUNTER — Inpatient Hospital Stay: Admitting: Internal Medicine

## 2024-05-21 ENCOUNTER — Other Ambulatory Visit: Payer: Self-pay

## 2024-05-21 ENCOUNTER — Inpatient Hospital Stay: Attending: Internal Medicine

## 2024-05-21 VITALS — BP 104/56 | HR 78 | Temp 97.8°F | Resp 17 | Ht 72.0 in | Wt 151.0 lb

## 2024-05-21 VITALS — BP 110/61 | HR 79 | Resp 16

## 2024-05-21 DIAGNOSIS — C3412 Malignant neoplasm of upper lobe, left bronchus or lung: Secondary | ICD-10-CM

## 2024-05-21 DIAGNOSIS — Z8546 Personal history of malignant neoplasm of prostate: Secondary | ICD-10-CM | POA: Diagnosis not present

## 2024-05-21 DIAGNOSIS — Z5112 Encounter for antineoplastic immunotherapy: Secondary | ICD-10-CM | POA: Insufficient documentation

## 2024-05-21 DIAGNOSIS — K5909 Other constipation: Secondary | ICD-10-CM | POA: Diagnosis not present

## 2024-05-21 DIAGNOSIS — C3411 Malignant neoplasm of upper lobe, right bronchus or lung: Secondary | ICD-10-CM | POA: Diagnosis not present

## 2024-05-21 DIAGNOSIS — Z923 Personal history of irradiation: Secondary | ICD-10-CM | POA: Diagnosis not present

## 2024-05-21 DIAGNOSIS — Z8551 Personal history of malignant neoplasm of bladder: Secondary | ICD-10-CM | POA: Diagnosis not present

## 2024-05-21 LAB — CBC WITH DIFFERENTIAL (CANCER CENTER ONLY)
Abs Immature Granulocytes: 0.13 K/uL — ABNORMAL HIGH (ref 0.00–0.07)
Basophils Absolute: 0 K/uL (ref 0.0–0.1)
Basophils Relative: 0 %
Eosinophils Absolute: 0.2 K/uL (ref 0.0–0.5)
Eosinophils Relative: 2 %
HCT: 37.1 % — ABNORMAL LOW (ref 39.0–52.0)
Hemoglobin: 12.4 g/dL — ABNORMAL LOW (ref 13.0–17.0)
Immature Granulocytes: 1 %
Lymphocytes Relative: 7 %
Lymphs Abs: 0.7 K/uL (ref 0.7–4.0)
MCH: 32.4 pg (ref 26.0–34.0)
MCHC: 33.4 g/dL (ref 30.0–36.0)
MCV: 96.9 fL (ref 80.0–100.0)
Monocytes Absolute: 0.9 K/uL (ref 0.1–1.0)
Monocytes Relative: 8 %
Neutro Abs: 8.5 K/uL — ABNORMAL HIGH (ref 1.7–7.7)
Neutrophils Relative %: 82 %
Platelet Count: 185 K/uL (ref 150–400)
RBC: 3.83 MIL/uL — ABNORMAL LOW (ref 4.22–5.81)
RDW: 12.8 % (ref 11.5–15.5)
WBC Count: 10.4 K/uL (ref 4.0–10.5)
nRBC: 0 % (ref 0.0–0.2)

## 2024-05-21 LAB — CMP (CANCER CENTER ONLY)
ALT: 20 U/L (ref 0–44)
AST: 16 U/L (ref 15–41)
Albumin: 3.4 g/dL — ABNORMAL LOW (ref 3.5–5.0)
Alkaline Phosphatase: 61 U/L (ref 38–126)
Anion gap: 5 (ref 5–15)
BUN: 14 mg/dL (ref 8–23)
CO2: 30 mmol/L (ref 22–32)
Calcium: 9.1 mg/dL (ref 8.9–10.3)
Chloride: 106 mmol/L (ref 98–111)
Creatinine: 0.88 mg/dL (ref 0.61–1.24)
GFR, Estimated: >60 mL/min (ref >60)
Glucose, Bld: 138 mg/dL — ABNORMAL HIGH (ref 70–99)
Potassium: 4 mmol/L (ref 3.5–5.1)
Sodium: 141 mmol/L (ref 135–145)
Total Bilirubin: 0.5 mg/dL (ref 0.0–1.2)
Total Protein: 6.2 g/dL — ABNORMAL LOW (ref 6.5–8.1)

## 2024-05-21 MED ORDER — SODIUM CHLORIDE 0.9 % IV SOLN
1500.0000 mg | Freq: Once | INTRAVENOUS | Status: AC
  Administered 2024-05-21: 1500 mg via INTRAVENOUS
  Filled 2024-05-21: qty 30

## 2024-05-21 MED ORDER — SODIUM CHLORIDE 0.9% FLUSH: 10.0000 mL | INTRAVENOUS | Status: DC | PRN

## 2024-05-21 MED ORDER — SODIUM CHLORIDE 0.9 % IV SOLN: INTRAVENOUS | Status: DC

## 2024-05-21 NOTE — Progress Notes (Signed)
 [facility] [phone]   [phone]  OFFICE PROGRESS NOTE  Practice, [facility] [address] 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  Presented with a large left hilar/mediastinal mass, multiple bilateral pulmonary nodules, prevascular lymph node, and subcutaneous nodules consistent with metastatic disease.  He was diagnosed in January 2025. 3) history of prostate cancer in 2014 4) history of urothelial carcinoma in 2017 5) WBR under the care of Dr. Dewey, last day on 12/26/23   PRIOR THERAPY: 1)  palliative course of radiation to the left lung. The last days expected on 09/16/2023. Under the care of Dr. Dewey. Completed on 09/19/23  2) RUL was treated with a course of stereotactic body radiation treatment by Dr. Dewey on 12/07/23 to the synchronous stage I NSCLC 3) SBRT to the left kidney suspicious metastatic lesion under the care of Dr. Dewey completed April 27, 2024.  CURRENT THERAPY: 1) Palliative systemic chemotherapy with carboplatin  for an AUC of 5 on day 1 and etoposide  100 mg/m2 on days 1, 2 and 3, and Imfinzi  1500 mg IV every 3 weeks with neulsta support. First dose on 09/06/23. Status post 10 cycles.    INTERVAL HISTORY: MALIN CERVINI 78 y.o. male returns to the clinic today for follow-up visit.Discussed the use of AI scribe software for clinical note transcription with the patient, who gave verbal consent to proceed.  History of Present Illness BALTHAZAR DOOLY is a 78 year old male with extensive stage small cell lung cancer and synchronous stage one right upper lobe adenocarcinoma who presents for evaluation for starting cycle number eleven of maintenance treatment with durvalumab .  He was diagnosed with extensive stage small cell lung cancer and synchronous stage one right upper lobe adenocarcinoma  in January 2025. He initially received systemic treatment with carboplatin , etoposide , and durvalumab  every three weeks, completing four cycles. Since then, he has been on maintenance treatment with single-agent durvalumab , having completed ten cycles so far.  He experiences a sensation of fluid accumulation, describing it as 'like a little water  or something up in there,' but does not believe it originates from his stomach. No nausea, vomiting, or headaches. He experiences constipation, with bowel movements occurring every three to four days, and sometimes uses laxatives to aid in bowel movements.  He underwent radiation therapy to the left kidney, which initially caused 'excruciating pain' after the third session, but this has since resolved, although occasional mild pain persists.    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  MEDICATIONS:  Current Outpatient Medications  Medication Sig Dispense Refill   albuterol  (VENTOLIN  HFA) 108 (90 Base) MCG/ACT inhaler Inhale 1-2 puffs into the lungs every 6 (six) hours as needed for wheezing or shortness of breath. 8 g  2   amLODipine (NORVASC) 10 MG tablet Take 10 mg by mouth daily.     APIXABAN (ELIQUIS) VTE STARTER PACK (10MG  AND 5MG ) Take as directed on package: start with two-5mg  tablets twice daily for 7 days. On day 8, switch to one-5mg  tablet twice daily. 74 each 0   COMIRNATY syringe      FLUZONE HIGH-DOSE 0.5 ML injection      lidocaine -prilocaine  (EMLA ) cream Apply 1 Application topically as needed. 30 g 2   memantine  (NAMENDA ) 10 MG tablet Take 1 tablet (10 mg total) by mouth 2 (two) times daily. 60 tablet 4   memantine  (NAMENDA ) 5 MG tablet Begin this prescription the first day of brain radiation. Week 1: take one tablet po qam. Week 2: take one tablet qam and qpm. Week 3: take two tablets qam, and one tablet po q pm. Week 4: take two tablets qam and qpm. Fill subsequent prescription q month. 70 tablet 0   prochlorperazine  (COMPAZINE ) 10 MG tablet Take 1 tablet (10 mg total) by mouth every 6 (six) hours as needed. (Patient not taking: Reported on 04/23/2024) 30 tablet 2   No current facility-administered medications for this visit.    SURGICAL HISTORY:  Past Surgical History:  Procedure Laterality Date   ABDOMINAL HERNIA REPAIR  1990's   BRONCHIAL BIOPSY  08/08/2023   Procedure: BRONCHIAL BIOPSIES;  Surgeon: Shelah Lamar RAMAN, MD;  Location: [facility] ENDOSCOPY;  Service: Pulmonary;;   BRONCHIAL BRUSHINGS  08/08/2023   Procedure: BRONCHIAL BRUSHINGS;  Surgeon: Shelah Lamar RAMAN, MD;  Location: [facility] ENDOSCOPY;  Service: Pulmonary;;   BRONCHIAL NEEDLE ASPIRATION BIOPSY  08/08/2023   Procedure: BRONCHIAL NEEDLE ASPIRATION BIOPSIES;  Surgeon: Shelah Lamar RAMAN, MD;  Location: [facility] ENDOSCOPY;  Service: Pulmonary;;   CYSTOSCOPY W/ RETROGRADES N/A 11/03/2015   Procedure: CYSTOSCOPY WITH RETROGRADE PYELOGRAM;  Surgeon: Belvie LITTIE Clara, MD;  Location: [facility];  Service: Urology;  Laterality: N/A;   CYSTOSCOPY W/ RETROGRADES Left 01/09/2016   Procedure: CYSTOSCOPY WITH LEFT RETROGRADE PYELOGRAM;   Surgeon: Belvie LITTIE Clara, MD;  Location: [facility];  Service: Urology;  Laterality: Left;   CYSTOSCOPY W/ RETROGRADES Bilateral 05/28/2016   Procedure: CYSTOSCOPY WITH RETROGRADE PYELOGRAM;  Surgeon: Belvie LITTIE Clara, MD;  Location: [facility];  Service: Urology;  Laterality: Bilateral;   CYSTOSCOPY W/ RETROGRADES Bilateral 02/17/2021   Procedure: CYSTOSCOPY WITH RETROGRADE PYELOGRAM;  Surgeon: Rosalind Zachary NOVAK, MD;  Location: [facility];  Service: Urology;  Laterality: Bilateral;   CYSTOSCOPY W/ URETERAL STENT REMOVAL Left 01/09/2016   Procedure: CYSTOSCOPY WITH LEFT STENT EXCHANGE;  Surgeon: Belvie LITTIE Clara, MD;  Location: [facility];  Service: Urology;  Laterality: Left;   CYSTOSCOPY WITH BIOPSY N/A 05/28/2016   Procedure: CYSTOSCOPY WITH BIOPSY;  Surgeon: Belvie LITTIE Clara, MD;  Location: [facility];  Service: Urology;  Laterality: N/A;   CYSTOSCOPY WITH URETHRAL DILATATION N/A 05/28/2016   Procedure: CYSTOSCOPY WITH URETHRAL DILATATION;  Surgeon: Belvie LITTIE Clara, MD;  Location: [facility];  Service: Urology;  Laterality: N/A;   HEMOSTASIS CONTROL  08/08/2023   Procedure: HEMOSTASIS CONTROL;  Surgeon: Shelah Lamar RAMAN, MD;  Location: [facility] ENDOSCOPY;  Service: Pulmonary;;   IR IMAGING GUIDED PORT INSERTION  09/19/2023   PROSTATE BIOPSY  05/08/12   Adenocarcinoma   RADIOACTIVE SEED IMPLANT  08/01/2012   Procedure: RADIOACTIVE SEED IMPLANT;  Surgeon: Thomasine Oiler, MD;  Location: [facility];  Service: Urology;  Laterality: N/A;  73 seeds implanted no seeds found in bladder   TRANSURETHRAL RESECTION OF BLADDER TUMOR N/A 11/03/2015   Procedure: TRANSURETHRAL RESECTION OF BLADDER TUMOR (TURBT)WITH INSERTION STENT;  Surgeon: Belvie LITTIE Clara, MD;  Location: [facility];  Service: Urology;  Laterality: N/A;   TRANSURETHRAL RESECTION OF BLADDER TUMOR N/A 02/17/2021    Procedure: TRANSURETHRAL RESECTION OF BLADDER TUMOR (TURBT);  Surgeon: Rosalind Zachary NOVAK, MD;  Location: [facility];  Service: Urology;  Laterality: N/A;  30 MINS   TRANSURETHRAL RESECTION OF BLADDER TUMOR WITH GYRUS (TURBT-GYRUS) N/A 01/09/2016   Procedure: TRANSURETHRAL RESECTION OF BLADDER TUMOR WITH GYRUS (TURBT-GYRUS);  Surgeon: Belvie LITTIE Clara, MD;  Location: [facility];  Service: Urology;  Laterality: N/A;   VIDEO BRONCHOSCOPY WITH RADIAL ENDOBRONCHIAL ULTRASOUND  08/08/2023   Procedure: VIDEO BRONCHOSCOPY WITH RADIAL ENDOBRONCHIAL ULTRASOUND;  Surgeon: Shelah Lamar RAMAN, MD;  Location: [facility] ENDOSCOPY;  Service: Pulmonary;;    REVIEW OF SYSTEMS:  Constitutional: positive for fatigue Eyes: negative Ears, nose, mouth, throat, and face: negative Respiratory: negative Cardiovascular: negative Gastrointestinal: positive for constipation Genitourinary:negative Integument/breast: negative Hematologic/lymphatic: negative Musculoskeletal:negative Neurological: negative Behavioral/Psych: negative Endocrine: negative Allergic/Immunologic: negative   PHYSICAL EXAMINATION: General appearance: alert, cooperative, fatigued, and no distress Head: Normocephalic, without obvious abnormality, atraumatic Neck: no adenopathy, no JVD, supple, symmetrical, trachea midline, and thyroid  not enlarged, symmetric, no tenderness/mass/nodules Lymph nodes: Cervical, supraclavicular, and axillary nodes normal. Resp: clear to auscultation bilaterally Back: symmetric, no curvature. ROM normal. No CVA tenderness. Cardio: regular rate and rhythm, S1, S2 normal, no murmur, click, rub or gallop GI: soft, non-tender; bowel sounds normal; no masses,  no organomegaly Extremities: extremities normal, atraumatic, no cyanosis or edema Neurologic: Alert and oriented X 3, normal strength and tone. Normal symmetric reflexes. Normal coordination and gait  ECOG PERFORMANCE STATUS: 1 - Symptomatic  but completely ambulatory  Blood pressure (!) 104/56, pulse 78, temperature 97.8 F (36.6 C), temperature source Temporal, resp. rate 17, height 6' (1.829 m), weight 151 lb (68.5 kg), SpO2 97%.  LABORATORY DATA: Lab Results  Component Value Date   WBC 10.4 05/21/2024   HGB 12.4 (L) 05/21/2024   HCT 37.1 (L) 05/21/2024   MCV 96.9 05/21/2024   PLT 185 05/21/2024      Chemistry      Component Value Date/Time   NA 141 05/21/2024 0825   K 4.0 05/21/2024 0825   CL 106 05/21/2024 0825   CO2 30 05/21/2024 0825   BUN 14 05/21/2024 0825   CREATININE 0.88 05/21/2024 0825      Component Value Date/Time   CALCIUM 9.1 05/21/2024 0825   ALKPHOS 61 05/21/2024 0825   AST 16 05/21/2024 0825   ALT 20 05/21/2024 0825   BILITOT 0.5 05/21/2024 0825       RADIOGRAPHIC STUDIES: CT Angio Chest PE W and/or Wo Contrast Result Date: 05/12/2024 EXAM: CTA of the Chest with and without contrast for PE 05/12/2024 06:42:11 PM TECHNIQUE: CTA of the chest was performed after the administration of 75 mL of iohexol  (OMNIPAQUE ) 350 MG/ML injection. Multiplanar reformatted images are provided for review. MIP images are provided for review. Automated exposure control,  iterative reconstruction, and/or weight based adjustment of the mA/kV was utilized to reduce the radiation dose to as low as reasonably achievable. COMPARISON: 04/22/2024 CLINICAL HISTORY: Pulmonary embolism (PE) suspected, high prob. FINDINGS: PULMONARY ARTERIES: Pulmonary arteries are adequately opacified for evaluation. Single small filling defect noted in a right upper lobe pulmonary arterial branch, partially occlusive compatible with small pulmonary embolus. Main pulmonary artery is normal in caliber. MEDIASTINUM: The heart is mildly enlarged. Coronary artery and aortic atherosclerosis are present. No acute abnormality of the thoracic aorta. LYMPH NODES: No mediastinal, hilar or axillary lymphadenopathy. LUNGS AND PLEURA: Moderate centrilobular and  paraseptal emphysema. Airspace opacity with bronchiectasis and architectural distortion again noted in the periventricular left upper lobe and adjacent superior segment of the left lower lobe as well as posterior right upper lobe. These findings are unchanged since prior study. No acute confluent airspace opacities. No pulmonary edema. No pleural effusion or pneumothorax. UPPER ABDOMEN: Limited images of the upper abdomen are unremarkable. SOFT TISSUES AND BONES: No acute bone or soft tissue abnormality. IMPRESSION: 1. Small pulmonary embolus in a right upper lobe pulmonary arterial branch. Given its small size and that it is nonocclusive, clinical significance is questioned. 2. Moderate centrilobular and paraseptal emphysema. 3. Chronic airspace opacity with bronchiectasis and architectural distortion in the left upper lobe, adjacent superior segment of the left lower lobe, and posterior right upper lobe, unchanged since prior study. Electronically signed by: Franky Crease MD 05/12/2024 06:55 PM EDT RP Workstation: [id number]   DG Chest Portable 1 View Result Date: 05/12/2024 EXAM: 1 VIEW(S) XRAY OF THE CHEST 05/12/2024 05:05:18 PM COMPARISON: 04/22/2024 CLIN[facility] HIST[facility] FINDINGS: LINES, TUBES AND DEVICES: Stable right internal jugular port-a-cath. 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 Elevated left hemidiaphragm is noted with possible minimal left basilar atelectasis or effusion. No pulmonary edema. No pneumothorax. HEART AND MEDIASTINUM: No acute abnormality of the cardiac and mediastinal silhouettes. BONES AND SOFT TISSUES: No acute osseous abnormality. IMPRESSION: 1. Stable bilateral perihilar opacities, most consistent with radiation fibrosis. 2. Elevated left hemidiaphragm with possible minimal left basilar atelectasis or effusion. 3. Stable right internal jugular port-a-cath. Electronically signed by: Lynwood Seip MD 05/12/2024 05:13 PM EDT  RP Workstation: [id number]   CT Angio Chest PE W and/or Wo Contrast Result Date: 04/22/2024 CLINICAL DATA:  One-week history of left rib pain. History of left-sided lung cancer undergoing chemotherapy EXAM: CT ANGIOGRAPHY CHEST WITH CONTRAST TECHNIQUE: Multidetector CT imaging of the chest was performed using the standard protocol during bolus administration of intravenous contrast. Multiplanar CT image reconstructions and MIPs were obtained to evaluate the vascular anatomy. RADIATION DOSE REDUCTION: This exam was performed according to the departmental dose-optimization program which includes automated exposure control, adjustment of the mA and/or kV according to patient size and/or use of iterative reconstruction technique. CONTRAST:  75mL OMNIPAQUE  IOHEXOL  350 MG/ML SOLN COMPARISON:  Nuclear medicine PET dated 03/05/2024, CT chest dated 02/14/2024 FINDINGS: Cardiovascular: Right chest wall port tip terminates in the right atrium. The study is high quality for the evaluation of pulmonary embolism. There are no filling defects in the central, lobar, segmental or subsegmental pulmonary artery branches to suggest acute pulmonary embolism. Main pulmonary artery measures 3.5 cm. Normal heart size. No significant pericardial fluid/thickening. Coronary artery calcifications and aortic atherosclerosis. Mediastinum/Nodes: Imaged thyroid  gland without nodules meeting criteria for imaging follow-up by size. Normal esophagus. No pathologically enlarged axillary, supraclavicular, mediastinal, or hilar lymph nodes. Lungs/Pleura: The central airways are patent. Mild diffuse bronchial wall  thickening. Similar moderate paraseptal emphysema. Increased irregular consolidation, volume loss, and architectural distortion with traction bronchiectasis involving the posterior perifissural left upper lobe and superior segment left lower lobe, as well as posterior right upper lobe. There is associated thickening and traction of the left  major fissure. Increased patchy ground-glass opacity in the superior segment right lower lobe. 6 x 6 mm perifissural right lower lobe nodule (12:116), new. No pneumothorax. No pleural effusion. Upper abdomen: Previously hypermetabolic focus adjacent to the left kidney has markedly decreased in size, measuring 6 mm (4:167), previously 1.9 cm. Left lower pole renal cyst. Musculoskeletal: No acute or abnormal lytic or blastic osseous lesions. Review of the MIP images confirms the above findings. IMPRESSION: 1. No evidence of pulmonary embolism. 2. Increased irregular consolidation, volume loss, and architectural distortion with traction bronchiectasis involving the posterior perifissural left upper lobe and superior segment left lower lobe, as well as posterior right upper lobe. Findings are favored to represent evolving postradiation changes. 3. Increased patchy ground-glass opacity in the superior segment right lower lobe, which may also reflect postradiation change or superimposed infection/inflammation. 4. New 6 mm perifissural right lower lobe nodule, indeterminate. 5. Previously hypermetabolic focus adjacent to the left kidney has markedly decreased in size, measuring 6 mm, previously 1.9 cm. 6. Aortic Atherosclerosis (ICD10-I70.0) and Emphysema (ICD10-J43.9). Electronically Signed   By: Limin  Xu M.D.   On: 04/22/2024 15:04   DG Chest Portable 1 View Result Date: 04/22/2024 CLINICAL DATA:  Left sided rib pain starting 1 week ago. Pain does not change with movement. Pain sometimes goes away if he 'grabs the area and lets it go'. EXAM: PORTABLE CHEST 1 VIEW COMPARISON:  Chest x-ray 08/08/2023, PET CT 03/05/2024 FINDINGS: Accessed right chest wall Port-A-Cath with tip overlying the right atrium. The heart and mediastinal contours are within normal limits. Persistent prominent hilar vasculature. Persistent patchy airspace opacities. No pulmonary edema. No pleural effusion. No pneumothorax. No acute osseous  abnormality. IMPRESSION: Persistent patchy airspace opacities. Electronically Signed   By: Morgane  Naveau M.D.   On: 04/22/2024 13:55    ASSESSMENT AND PLAN:  This is a very pleasant 78 years old African-American male with  extensive stage small cell lung cancer.  Presented with a large left hilar/mediastinal mass, multiple bilateral pulmonary nodules, prevascular lymph node, and subcutaneous nodules consistent with metastatic disease.  He was diagnosed in January 2025.  He also has a history of a stage I adenocarcinoma of the right upper lobe status post SBRT. He is currently undergoing systemic chemotherapy with carboplatin  for AUC of 5 on day 1, etoposide  100 Mg/M2 on days 1, 2 and 3 with Neulasta  support in addition to Imfinzi  1500 Mg IV on day 1 every 3 weeks.  He is status post 10 cycles.  Starting from cycle #5 the patient is on maintenance treatment with Imfinzi  1500 Mg IV every 4 weeks. The patient had a PET scan performed recently that showed mixed response with resolution of the hypermetabolic mediastinal adenopathy subcutaneous soft tissue nodules but there was new hypermetabolic nodule adjacent to/or arising from the left kidney indicative of a metastasis. He has been tolerating this treatment fairly well. He had repeat CT scan of the chest, abdomen and pelvis performed recently.  I personally independently reviewed the scan and discussed the result with the patient today.  His scan showed no concerning findings for disease progression. I recommended for him to continue his current treatment with durvalumab  and he will proceed with cycle #11 today. Assessment and  Plan Assessment & Plan Extensive stage small cell lung cancer Diagnosed in January 2025. Currently undergoing systemic treatment with carboplatin , etoposide , and durvalumab  every three weeks, status post four cycles. Maintenance treatment with single agent durvalumab , status post ten cycles. Recent CT scan of the chest, abdomen,  and pelvis shows no progression of disease. - Continue durvalumab  treatment, cycle number eleven today - Provided copy of recent CT scan to him  Stage I right upper lobe lung adenocarcinoma Diagnosed in January 2025. No progression noted on recent imaging.  Constipation Chronic constipation with bowel movements occurring every three to four days. No diarrhea reported. Occasional use of laxatives. - Recommended taking a stool softener every night The patient was advised to call immediately if he has any concerning symptoms in the interval.  The patient voices understanding of current disease status and treatment options and is in agreement with the current care plan.  All questions were answered. The patient knows to call the clinic with any problems, questions or concerns. We can certainly see the patient much sooner if necessary.  The total time spent in the appointment was 30 minutes.  Disclaimer: This note was dictated with voice recognition software. Similar sounding words can inadvertently be transcribed and may not be corrected upon review.

## 2024-05-23 ENCOUNTER — Ambulatory Visit
Admission: RE | Admit: 2024-05-23 | Discharge: 2024-05-23 | Disposition: A | Discharge status: Home / Self Care | Source: Ambulatory Visit | Attending: Radiation Oncology | Admitting: Radiation Oncology

## 2024-05-23 DIAGNOSIS — C3412 Malignant neoplasm of upper lobe, left bronchus or lung: Secondary | ICD-10-CM

## 2024-05-23 MED ORDER — GADOPICLENOL 0.5 MMOL/ML IV SOLN
6.5000 mL | Freq: Once | INTRAVENOUS | Status: AC | PRN
  Administered 2024-05-23: 6.5 mL via INTRAVENOUS

## 2024-05-28 NOTE — Progress Notes (Signed)
 Radiation Oncology         [phone] ________________________________  Outpatient Re-Consultation - Conducted via telephone at patient request.  I spoke with the patient to conduct this re-consult visit via telephone. The patient was notified in advance and was offered an in person or telemedicine meeting to allow for face to face communication but instead preferred to proceed with a telephone visit.  Name: John [name]        MRN: [id number]  Date of Service: 05/29/2024 DOB: [birth year]  RR:Emjruprz, [facility], [place], [place]     REFERRING PHYSICIAN: Sherrod Sherrod, MD   DIAGNOSIS: The primary encounter diagnosis was Malignant neoplasm of right upper lobe of lung (HCC). 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   HISTORY OF PRESENT ILLNESS: John [name] is a 78 y.o. 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  He had a CTA to follow-up on an enlarged pulmonary artery on 05/05/2023 which showed  a large left hilar mass measuring 7.4 cm causing severe narrowing of the left upper lobe pulmonary artery and superior left pulmonary vein with an enlarged right hilar lymph node measuring 10 mm.  In addition there was complete occlusion of the apical posterior and anterior left upper lobe bronchi and centrilobular nodules in the left upper lobe.  There was also a spiculated nodule in the right upper lobe measuring 2.4 cm.  He was sent for evaluation with pulmonary medicine, and CT super D scan showed an increase in the mass in the AP window  measuring up to 9.2 cm, and the right upper lobe mass measuring up to 2.6 cm.  Ultimately he underwent a bronchoscopy on 08/08/2023.  Final cytology results showed adenocarcinoma on FNA involving the right upper lobe and atypical cells in the brushing.  In addition the left upper lobe specimen including brushing and biopsy showed small cell carcinoma.   His PET scan showed that he had limited stage small cell disease but was felt to be stage I cancer in his right upper lobe so he was able to undergo chemoradiation for his left lung cancer followed by stereotactic body radiotherapy for his right lung cancer.  Given his excellent performance status and desires to remain aggressive he completed prophylactic cranial irradiation (PCI) in June 2025.  He has transitioned to systemic immunotherapy for consolidation, but recent restaging CT scan showed concern for a new nodular area coming off the interpolar left kidney measuring 1.9 cm.  A PET scan on 02/14/24 to restage him showed no residual hypermetabolic adenopathy, post radiation effect with SUV of 5.1 along the left lower lobe and left upper lobe.  Volume loss and mild hypermetabolism in the posterior segment of the right upper lobe was also noted to be consistent with posttreatment change and the only new area that was felt to be persisting and actively malignant was new hypermetabolic activity in the nodule arising from/lateral to the interpolar left kidney measuring 1.9 cm in greatest dimension.  The SUV was 16.9.  He was offered SBRT to treat this area off of the left  kidney which he completed on 04/27/2024.    He remains in surveillance of his brain and continues with Durvalumab  with Dr. Sherrod.  His recent MRI of the brain on 05/23/2024 showed no evidence of active brain disease or abnormal enhancement.  No other acute findings were appreciated.  He is contacted by phone to review this.     PREVIOUS RADIATION THERAPY: Yes   04/17/24-04/27/24: SBRT  Treatment Plan Name: Abd_SBRT Site: Abdomen, soft tissue nodule adjacent to the left kidney  Technique: SBRT/SRT-IMRT Mode: Photon Dose Per Fraction: 10 Gy Prescribed Dose (Delivered / Prescribed): 50 Gy / 50 Gy Prescribed Fxs (Delivered / Prescribed): 5 / 5  12/13/23-12/26/23 Plan Name: Brain_PCI Site: Brain Technique: IMRT Mode: Photon Dose Per Fraction: 2.5 Gy Prescribed Dose (Delivered / Prescribed): 25 Gy / 25 Gy Prescribed Fxs (Delivered / Prescribed): 10 / 10  11/30/23-12/07/23 SBRT Treatment: The tumor in the RUL was treated with a course of stereotactic body radiation treatment. The patient received 54 Gy In 3 fractions at 18 G per fraction.  08/29/23-09/19/23 Plan Name: Lung_L Site: Lung, Left Technique: 3D Mode: Photon Dose Per Fraction: 2.5 Gy Prescribed Dose (Delivered / Prescribed): 37.5 Gy / 37.5 Gy Prescribed Fxs (Delivered / Prescribed): 15 / 15  08/01/2012 Site/dose: Prostate 14,500 cGy, isotope I-125 utilizing 73 seeds and 24 needles. Individual seed activity 0.43 mCi per seed for a total implant activity of 31.9 mCi with Dr. 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   worked up with cardiology dr hochrein and f/u prn   Nocturia 02/11/2021   Prostate CA (HCC) 2024   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   Wears  glasses 02/11/2021       PAST SURGICAL HISTORY: Past Surgical History:  Procedure Laterality Date   ABDOMINAL HERNIA REPAIR  1990's   BRONCHIAL BIOPSY  08/08/2023   Procedure: BRONCHIAL BIOPSIES;  Surgeon: Shelah Lamar RAMAN, MD;  Location: [facility];  Service: Pulmonary;;   BRONCHIAL BRUSHINGS  08/08/2023   Procedure: BRONCHIAL BRUSHINGS;  Surgeon: Shelah Lamar RAMAN, MD;  Location: [facility];  Service: Pulmonary;;   BRONCHIAL NEEDLE ASPIRATION BIOPSY  08/08/2023   Procedure: BRONCHIAL NEEDLE ASPIRATION BIOPSIES;  Surgeon: Shelah Lamar RAMAN, MD;  Location: [facility];  Service: Pulmonary;;   CYSTOSCOPY W/ RETROGRADES N/A 11/03/2015   Procedure: CYSTOSCOPY WITH RETROGRADE PYELOGRAM;  Surgeon: Belvie LITTIE Sherrilee, MD;  Location: [facility];  Service: Urology;  Laterality: N/A;   CYSTOSCOPY W/ RETROGRADES Left 01/09/2016   Procedure: CYSTOSCOPY WITH LEFT RETROGRADE PYELOGRAM;  Surgeon: Belvie LITTIE Sherrilee, MD;  Location: [facility];  Service: Urology;  Laterality: Left;   CYSTOSCOPY W/ RETROGRADES Bilateral 05/28/2016   Procedure: CYSTOSCOPY WITH RETROGRADE PYELOGRAM;  Surgeon: Belvie LITTIE Sherrilee, MD;  Location: [facility];  Service: Urology;  Laterality: Bilateral;   CYSTOSCOPY W/ RETROGRADES Bilateral 02/17/2021   Procedure: CYSTOSCOPY WITH RETROGRADE PYELOGRAM;  Surgeon: Rosalind Zachary NOVAK, MD;  Location: [facility];  Service: Urology;  Laterality: Bilateral;   CYSTOSCOPY W/ URETERAL STENT REMOVAL Left 01/09/2016   Procedure: CYSTOSCOPY WITH LEFT STENT EXCHANGE;  Surgeon: Belvie LITTIE Clara, MD;  Location: [facility];  Service: Urology;  Laterality: Left;   CYSTOSCOPY WITH BIOPSY N/A 05/28/2016   Procedure: CYSTOSCOPY WITH BIOPSY;  Surgeon: Belvie LITTIE Clara, MD;  Location: [facility];  Service: Urology;  Laterality: N/A;   CYSTOSCOPY WITH URETHRAL DILATATION N/A 05/28/2016   Procedure: CYSTOSCOPY WITH  URETHRAL DILATATION;  Surgeon: Belvie LITTIE Clara, MD;  Location: [facility];  Service: Urology;  Laterality: N/A;   HEMOSTASIS CONTROL  08/08/2023   Procedure: HEMOSTASIS CONTROL;  Surgeon: Shelah Lamar RAMAN, MD;  Location: [facility] ENDOSCOPY;  Service: Pulmonary;;   IR IMAGING GUIDED PORT INSERTION  09/19/2023   PROSTATE BIOPSY  05/08/12   Adenocarcinoma   RADIOACTIVE SEED IMPLANT  08/01/2012   Procedure: RADIOACTIVE SEED IMPLANT;  Surgeon: Thomasine Oiler, MD;  Location: [facility];  Service: Urology;  Laterality: N/A;  73 seeds implanted no seeds found in bladder   TRANSURETHRAL RESECTION OF BLADDER TUMOR N/A 11/03/2015   Procedure: TRANSURETHRAL RESECTION OF BLADDER TUMOR (TURBT)WITH INSERTION STENT;  Surgeon: Belvie LITTIE Clara, MD;  Location: [facility];  Service: Urology;  Laterality: N/A;   TRANSURETHRAL RESECTION OF BLADDER TUMOR N/A 02/17/2021   Procedure: TRANSURETHRAL RESECTION OF BLADDER TUMOR (TURBT);  Surgeon: Rosalind Zachary NOVAK, MD;  Location: [facility];  Service: Urology;  Laterality: N/A;  30 MINS   TRANSURETHRAL RESECTION OF BLADDER TUMOR WITH GYRUS (TURBT-GYRUS) N/A 01/09/2016   Procedure: TRANSURETHRAL RESECTION OF BLADDER TUMOR WITH GYRUS (TURBT-GYRUS);  Surgeon: Belvie LITTIE Clara, MD;  Location: [facility];  Service: Urology;  Laterality: N/A;   VIDEO BRONCHOSCOPY WITH RADIAL ENDOBRONCHIAL ULTRASOUND  08/08/2023   Procedure: VIDEO BRONCHOSCOPY WITH RADIAL ENDOBRONCHIAL ULTRASOUND;  Surgeon: Shelah Lamar RAMAN, MD;  Location: [facility];  Service: Pulmonary;;     FAMILY HISTORY:  Family History  Problem Relation Age of Onset   Hyperlipidemia Mother    Asthma Mother    Heart failure Mother    Aneurysm Father    Hyperlipidemia Sister    Asthma Sister      SOCIAL HISTORY:  reports that he quit smoking about 9 years ago. His smoking use included cigarettes. He started smoking about 51 years ago. He has a  21 pack-year smoking history. He has never used smokeless tobacco. He reports current alcohol use. He reports current drug use. Drug: Marijuana.  The patient is single and is supported by his niece Sabrina.  He lives in [place] .  He has worked in dance movement psychotherapist.   ALLERGIES: Patient has no known allergies.   MEDICATIONS:  Current Outpatient Medications  Medication Sig Dispense Refill   albuterol  (VENTOLIN  HFA) 108 (90 Base) MCG/ACT inhaler Inhale 1-2 puffs into the lungs every 6 (six) hours as needed for wheezing or shortness of breath. 8 g 2   amLODipine (NORVASC) 10 MG tablet Take 10 mg by mouth daily.     APIXABAN (ELIQUIS) VTE STARTER PACK (10MG  AND 5MG ) Take as directed on package: start with two-5mg  tablets twice daily for 7 days. On day 8, switch to one-5mg  tablet twice daily. 74  each 0   COMIRNATY syringe      FLUZONE HIGH-DOSE 0.5 ML injection      lidocaine -prilocaine  (EMLA ) cream Apply 1 Application topically as needed. 30 g 2   memantine  (NAMENDA ) 10 MG tablet Take 1 tablet (10 mg total) by mouth 2 (two) times daily. 60 tablet 4   memantine  (NAMENDA ) 5 MG tablet Begin this prescription the first day of brain radiation. Week 1: take one tablet po qam. Week 2: take one tablet qam and qpm. Week 3: take two tablets qam, and one tablet po q pm. Week 4: take two tablets qam and qpm. Fill subsequent prescription q month. 70 tablet 0   prochlorperazine  (COMPAZINE ) 10 MG tablet Take 1 tablet (10 mg total) by mouth every 6 (six) hours as needed. (Patient not taking: Reported on 04/23/2024) 30 tablet 2   No current facility-administered medications for this visit.     REVIEW OF SYSTEMS: On review of systems, the patient reports that he is doing well. He is taking Namenda  and tolerating this and believes he just filled the last prescription refill to complete the course. He reports he is not having flank pain as he noted after a few of his last radiation treatments. He states  when he gets IV contrast for CT scans though it seems to be that this causes his symptoms again, or at least was coincidental to the last time he had a CT scan. No other complaints are verbalized.   PHYSICAL EXAM:  Unable to examine given encounter type    ECOG = 1  0 - Asymptomatic (Fully active, able to carry on all predisease activities without restriction)  1 - Symptomatic but completely ambulatory (Restricted in physically strenuous activity but ambulatory and able to carry out work of a light or sedentary nature. For example, light housework, office work)  2 - Symptomatic, <50% in bed during the day (Ambulatory and capable of all self care but unable to carry out any work activities. Up and about more than 50% of waking hours)  3 - Symptomatic, >50% in bed, but not bedbound (Capable of only limited self-care, confined to bed or chair 50% or more of waking hours)  4 - Bedbound (Completely disabled. Cannot carry on any self-care. Totally confined to bed or chair)  5 - Death   Raylene MM, Creech RH, Tormey DC, et al. [phone]). Toxicity and response criteria of the [facility]. Am. DOROTHA Bridges. Oncol. 5 (6): 649-55    LABORATORY DATA:  Lab Results  Component Value Date   WBC 10.4 05/21/2024   HGB 12.4 (L) 05/21/2024   HCT 37.1 (L) 05/21/2024   MCV 96.9 05/21/2024   PLT 185 05/21/2024   Lab Results  Component Value Date   NA 141 05/21/2024   K 4.0 05/21/2024   CL 106 05/21/2024   CO2 30 05/21/2024   Lab Results  Component Value Date   ALT 20 05/21/2024   AST 16 05/21/2024   ALKPHOS 61 05/21/2024   BILITOT 0.5 05/21/2024      RADIOGRAPHY: MR Brain W Wo Contrast Result Date: 05/26/2024 EXAM: MRI BRAIN WITH AND WITHOUT CONTRAST 05/23/2024 12:19:59 PM TECHNIQUE: Multiplanar multisequence MRI of the head/brain was performed with and without the administration of intravenous contrast. CONTRAST: 7.5 mL of Vueway. COMPARISON: MR Head 11/24/2023. CLINICAL  HISTORY: Small cell lung cancer and malignant neoplasm of upper lobe of left lung. FINDINGS: BRAIN AND VENTRICLES: No acute infarct. No acute intracranial hemorrhage. No mass effect  or midline shift. No hydrocephalus. The sella is unremarkable. Normal flow voids. No mass or abnormal enhancement. ORBITS: No acute abnormality. SINUSES: No acute abnormality. BONES AND SOFT TISSUES: Normal bone marrow signal and enhancement. No acute soft tissue abnormality. IMPRESSION: 1. No acute intracranial abnormality. 2. No mass or abnormal enhancement. Electronically signed by: Lonni Necessary MD 05/26/2024 11:55 AM EST RP Workstation: [id number]   CT Angio Chest PE W and/or Wo Contrast Result Date: 05/12/2024 EXAM: CTA of the Chest with and without contrast for PE 05/12/2024 06:42:11 PM TECHNIQUE: CTA of the chest was performed after the administration of 75 mL of iohexol  (OMNIPAQUE ) 350 MG/ML injection. Multiplanar reformatted images are provided for review. MIP images are provided for review. Automated exposure control, iterative reconstruction, and/or weight based adjustment of the mA/kV was utilized to reduce the radiation dose to as low as reasonably achievable. COMPARISON: 04/22/2024 CLINICAL HISTORY: Pulmonary embolism (PE) suspected, high prob. FINDINGS: PULMONARY ARTERIES: Pulmonary arteries are adequately opacified for evaluation. Single small filling defect noted in a right upper lobe pulmonary arterial branch, partially occlusive compatible with small pulmonary embolus. Main pulmonary artery is normal in caliber. MEDIASTINUM: The heart is mildly enlarged. Coronary artery and aortic atherosclerosis are present. No acute abnormality of the thoracic aorta. LYMPH NODES: No mediastinal, hilar or axillary lymphadenopathy. LUNGS AND PLEURA: Moderate centrilobular and paraseptal emphysema. Airspace opacity with bronchiectasis and architectural distortion again noted in the periventricular left upper lobe and adjacent  superior segment of the left lower lobe as well as posterior right upper lobe. These findings are unchanged since prior study. No acute confluent airspace opacities. No pulmonary edema. No pleural effusion or pneumothorax. UPPER ABDOMEN: Limited images of the upper abdomen are unremarkable. SOFT TISSUES AND BONES: No acute bone or soft tissue abnormality. IMPRESSION: 1. Small pulmonary embolus in a right upper lobe pulmonary arterial branch. Given its small size and that it is nonocclusive, clinical significance is questioned. 2. Moderate centrilobular and paraseptal emphysema. 3. Chronic airspace opacity with bronchiectasis and architectural distortion in the left upper lobe, adjacent superior segment of the left lower lobe, and posterior right upper lobe, unchanged since prior study. Electronically signed by: Franky Crease MD 05/12/2024 06:55 PM EDT RP Workstation: [id number]   DG Chest Portable 1 View Result Date: 05/12/2024 EXAM: 1 VIEW(S) XRAY OF THE CHEST 05/12/2024 05:05:18 PM COMPARISON: 04/22/2024 CLINICAL HISTORY: [facility] FINDINGS: LINES, TUBES AND DEVICES: Stable right internal jugular port-a-cath. 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 Elevated left hemidiaphragm is noted with possible minimal left basilar atelectasis or effusion. No pulmonary edema. No pneumothorax. HEART AND MEDIASTINUM: No acute abnormality of the cardiac and mediastinal silhouettes. BONES AND SOFT TISSUES: No acute osseous abnormality. IMPRESSION: 1. Stable bilateral perihilar opacities, most consistent with radiation fibrosis. 2. Elevated left hemidiaphragm with possible minimal left basilar atelectasis or effusion. 3. Stable right internal jugular port-a-cath. Electronically signed by: Lynwood Seip MD 05/12/2024 05:13 PM EDT RP Workstation: [id number]        IMPRESSION/PLAN: 1. Progressive metastatic, Limited Small cell carcinoma involving the LUL and synchronous  Stage IA3, cT1cN0M0, NSCLC, adenocarcinoma of the RUL.  We reviewed the findings from the patient's recent MRI scan.  Fortunately they are reassuring.  He has noticed improvement in the pain that he had reported in the left flank to Dr. Sherrod but this will be followed expectantly given he noted this  again at the time of IV contrast administration.  He is also continuing to tolerate immunotherapy.  We discussed the rationale for continued surveillance and we will plan a repeat MRI of the brain in 3 to 4 months.  He continues with Namenda  for cognitive protection from his prior radiation and should complete this prior to the end of the year. 2. History of T1c favorable adenocarcinoma of the prostate.  The patient continues to be without evidence of disease based on PSA and follows with [facility].  He is also followed in that group as well because of his history of transitional bladder cancer and we appreciate their recommendations and management for the patient.    This encounter was conducted via telephone.  The patient has provided two factor identification and has given verbal consent for this type of encounter and has been advised to only accept a meeting of this type in a secure network environment. The time spent during this encounter was 35 minutes including preparation, discussion, and coordination of the patient's care. The attendants for this meeting include Madelin Frost, LPN, Donald Estefana Husband  and [name].  During the encounter, Madelin Frost, LPN, and Donald Estefana Husband were located at [facility] Radiation Oncology Department.  [name] was located at home.        Donald KYM Husband, [facility]   **Disclaimer: This note was dictated with voice recognition software. Similar sounding words can inadvertently be transcribed and this note may contain transcription errors which may not have been corrected upon publication of note.**

## 2024-05-29 ENCOUNTER — Other Ambulatory Visit: Payer: Self-pay

## 2024-05-29 ENCOUNTER — Ambulatory Visit
Admission: RE | Admit: 2024-05-29 | Discharge: 2024-05-29 | Disposition: A | Discharge status: Home / Self Care | Source: Ambulatory Visit | Attending: Physician Assistant | Admitting: Physician Assistant

## 2024-05-29 ENCOUNTER — Emergency Department (HOSPITAL_COMMUNITY)
Admission: EM | Admit: 2024-05-29 | Discharge: 2024-05-29 | Disposition: A | Discharge status: Home / Self Care | Attending: Emergency Medicine | Admitting: Emergency Medicine

## 2024-05-29 ENCOUNTER — Emergency Department (HOSPITAL_COMMUNITY)

## 2024-05-29 ENCOUNTER — Encounter (HOSPITAL_COMMUNITY): Payer: Self-pay

## 2024-05-29 DIAGNOSIS — Z8719 Personal history of other diseases of the digestive system: Secondary | ICD-10-CM | POA: Diagnosis not present

## 2024-05-29 DIAGNOSIS — R109 Unspecified abdominal pain: Secondary | ICD-10-CM | POA: Diagnosis not present

## 2024-05-29 DIAGNOSIS — I454 Nonspecific intraventricular block: Secondary | ICD-10-CM | POA: Diagnosis not present

## 2024-05-29 DIAGNOSIS — Z85118 Personal history of other malignant neoplasm of bronchus and lung: Secondary | ICD-10-CM | POA: Diagnosis not present

## 2024-05-29 DIAGNOSIS — C7902 Secondary malignant neoplasm of left kidney and renal pelvis: Secondary | ICD-10-CM | POA: Insufficient documentation

## 2024-05-29 DIAGNOSIS — Z7901 Long term (current) use of anticoagulants: Secondary | ICD-10-CM | POA: Insufficient documentation

## 2024-05-29 DIAGNOSIS — J9811 Atelectasis: Secondary | ICD-10-CM | POA: Diagnosis not present

## 2024-05-29 DIAGNOSIS — K5900 Constipation, unspecified: Secondary | ICD-10-CM | POA: Insufficient documentation

## 2024-05-29 DIAGNOSIS — R0789 Other chest pain: Secondary | ICD-10-CM | POA: Diagnosis present

## 2024-05-29 DIAGNOSIS — C3412 Malignant neoplasm of upper lobe, left bronchus or lung: Secondary | ICD-10-CM | POA: Insufficient documentation

## 2024-05-29 DIAGNOSIS — C3411 Malignant neoplasm of upper lobe, right bronchus or lung: Secondary | ICD-10-CM | POA: Insufficient documentation

## 2024-05-29 DIAGNOSIS — Z923 Personal history of irradiation: Secondary | ICD-10-CM | POA: Diagnosis not present

## 2024-05-29 DIAGNOSIS — Z79899 Other long term (current) drug therapy: Secondary | ICD-10-CM | POA: Diagnosis not present

## 2024-05-29 DIAGNOSIS — I700 Atherosclerosis of aorta: Secondary | ICD-10-CM | POA: Insufficient documentation

## 2024-05-29 DIAGNOSIS — I2699 Other pulmonary embolism without acute cor pulmonale: Secondary | ICD-10-CM | POA: Diagnosis not present

## 2024-05-29 DIAGNOSIS — Z8546 Personal history of malignant neoplasm of prostate: Secondary | ICD-10-CM | POA: Insufficient documentation

## 2024-05-29 DIAGNOSIS — R079 Chest pain, unspecified: Secondary | ICD-10-CM

## 2024-05-29 LAB — CBC
HCT: 42.4 % (ref 39.0–52.0)
Hemoglobin: 14 g/dL (ref 13.0–17.0)
MCH: 32.6 pg (ref 26.0–34.0)
MCHC: 33 g/dL (ref 30.0–36.0)
MCV: 98.8 fL (ref 80.0–100.0)
Platelets: 226 K/uL (ref 150–400)
RBC: 4.29 MIL/uL (ref 4.22–5.81)
RDW: 12.8 % (ref 11.5–15.5)
WBC: 12.2 K/uL — ABNORMAL HIGH (ref 4.0–10.5)
nRBC: 0 % (ref 0.0–0.2)

## 2024-05-29 LAB — COMPREHENSIVE METABOLIC PANEL WITH GFR
ALT: 13 U/L (ref 0–44)
AST: 20 U/L (ref 15–41)
Albumin: 3.9 g/dL (ref 3.5–5.0)
Alkaline Phosphatase: 78 U/L (ref 38–126)
Anion gap: 10 (ref 5–15)
BUN: 15 mg/dL (ref 8–23)
CO2: 28 mmol/L (ref 22–32)
Calcium: 10.2 mg/dL (ref 8.9–10.3)
Chloride: 103 mmol/L (ref 98–111)
Creatinine, Ser: 0.96 mg/dL (ref 0.61–1.24)
GFR, Estimated: >60 mL/min (ref >60)
Glucose, Bld: 136 mg/dL — ABNORMAL HIGH (ref 70–99)
Potassium: 4 mmol/L (ref 3.5–5.1)
Sodium: 142 mmol/L (ref 135–145)
Total Bilirubin: 0.4 mg/dL (ref 0.0–1.2)
Total Protein: 7.2 g/dL (ref 6.5–8.1)

## 2024-05-29 LAB — I-STAT CHEM 8, ED
BUN: 16 mg/dL (ref 8–23)
Calcium, Ion: 1.29 mmol/L (ref 1.15–1.40)
Chloride: 103 mmol/L (ref 98–111)
Creatinine, Ser: 1 mg/dL (ref 0.61–1.24)
Glucose, Bld: 136 mg/dL — ABNORMAL HIGH (ref 70–99)
HCT: 43 % (ref 39.0–52.0)
Hemoglobin: 14.6 g/dL (ref 13.0–17.0)
Potassium: 4 mmol/L (ref 3.5–5.1)
Sodium: 142 mmol/L (ref 135–145)
TCO2: 29 mmol/L (ref 22–32)

## 2024-05-29 LAB — URINALYSIS, ROUTINE W REFLEX MICROSCOPIC
Bilirubin Urine: NEGATIVE
Glucose, UA: NEGATIVE mg/dL
Hgb urine dipstick: NEGATIVE
Ketones, ur: NEGATIVE mg/dL
Leukocytes,Ua: NEGATIVE
Nitrite: NEGATIVE
Protein, ur: NEGATIVE mg/dL
Specific Gravity, Urine: 1.02 (ref 1.005–1.030)
pH: 5 (ref 5.0–8.0)

## 2024-05-29 LAB — PRO BRAIN NATRIURETIC PEPTIDE: Pro Brain Natriuretic Peptide: 113 pg/mL (ref <300.0)

## 2024-05-29 LAB — LIPASE, BLOOD: Lipase: 39 U/L (ref 11–51)

## 2024-05-29 LAB — TROPONIN T, HIGH SENSITIVITY: Troponin T High Sensitivity: <15 ng/L (ref 0–19)

## 2024-05-29 MED ORDER — METAMUCIL SMOOTH TEXTURE 58.6 % PO POWD: 1.0000 | Freq: Three times a day (TID) | ORAL | 12 refills | Status: AC

## 2024-05-29 MED ORDER — SENNOSIDES-DOCUSATE SODIUM 8.6-50 MG PO TABS: 1.0000 | ORAL_TABLET | Freq: Every day | ORAL | 0 refills | Status: AC

## 2024-05-29 MED ORDER — LIDOCAINE 5 % EX PTCH
1.0000 | MEDICATED_PATCH | Freq: Once | CUTANEOUS | Status: DC
  Administered 2024-05-29: 3 via TRANSDERMAL
  Filled 2024-05-29: qty 3

## 2024-05-29 MED ORDER — IOHEXOL 300 MG/ML  SOLN
100.0000 mL | Freq: Once | INTRAMUSCULAR | Status: AC | PRN
  Administered 2024-05-29: 100 mL via INTRAVENOUS

## 2024-05-29 MED ORDER — ACETAMINOPHEN 500 MG PO TABS
1000.0000 mg | ORAL_TABLET | Freq: Once | ORAL | Status: AC
  Administered 2024-05-29: 1000 mg via ORAL
  Filled 2024-05-29: qty 2

## 2024-05-29 NOTE — Discharge Instructions (Addendum)
 You were seen in the emergency department for your chest pain and your constipation.  Your workup showed no signs of heart attack or abnormalities with your lungs or any signs of infection.  You do not appear severely constipated on your CT scan.  You had signs on your x-ray of radiation changes in your lungs which may be contributing to some of your pain.  You can follow-up with your oncologist for further pain management.  You can take a stool softener and fiber daily to help prevent constipation and continue to use your laxatives as needed.  You should return to the emergency department if you are having significantly worsening pain, severe shortness of breath, fever or any other new or concerning symptoms.

## 2024-05-29 NOTE — ED Provider Triage Note (Signed)
 Emergency Medicine Provider Triage Evaluation Note  John [name] , a 78 y.o. male  was evaluated in triage.  Pt complains of no bowel movements for the last 4 days.  Says he feels bloated however does not have any objective bloating.  Feels a sense of fullness in the left upper abdomen.  Has infrequent passage of very small volume hard to pass dry stools.  History of radiation treatment for both prostate cancer and also for lung cancer..  Review of Systems  Positive: As above Negative:   Physical Exam  BP 119/67 (BP Location: Right Arm)   Pulse 95   Temp 98.4 F (36.9 C) (Oral)   Resp 16   SpO2 100%  Gen:   Awake, no distress   Resp:  Normal effort MSK:   Moves extremities without difficulty  Other:  Decreased bowel sounds noted  Medical Decision Making  Medically screening exam initiated at 2:36 PM.  Appropriate orders placed.  John [name] was informed that the remainder of the evaluation will be completed by another provider, this initial triage assessment does not replace that evaluation, and the importance of remaining in the ED until their evaluation is complete.  Initial lab and imaging evaluation placed, rule out possible bowel obstruction.  Does not have any nausea at this time.   Myriam Dorn [place], [place] 05/29/24 [phone]

## 2024-05-29 NOTE — ED Provider Notes (Signed)
 John [name]   CSN: [id number] Arrival date & time: 05/29/24  1401     Patient presents with: Abdominal Pain   John [name] is a 78 y.o. male.   Patient is a 78 year old male with a past medical history of lung cancer status post chemo and radiation, recent diagnosis of PE on Eliquis presenting to the emergency department with left-sided chest and abdominal pain.  Patient states that he has been having intermittent pain on his left side ever since his third radiation therapy to the left side of his chest.  He states that he has also been dealing with intermittent constipation.  He states that today was day 5 of being constipated and woke up this morning with left-sided pain that started in the left back and radiated around to the left side of his chest.  He states that he felt like he needed to have a bowel movement and only a small amount of stool came out.  He states that his pain mildly improved and then worsened throughout the day which prompted him to come to the emergency department.  He denies any associated nausea or vomiting, black or bloody stools.  He denies any shortness of breath.  He states that he has been taking his Eliquis as prescribed.  The history is provided by the patient.  Abdominal Pain      Prior to Admission medications   Medication Sig Start Date End Date Taking? Authorizing Provider  psyllium (METAMUCIL SMOOTH TEXTURE) 58.6 % powder Take 1 packet by mouth 3 (three) times daily. 05/29/24  Yes Kingsley, Alfonza Toft K, DO  senna-docusate (SENOKOT-S) 8.6-50 MG tablet Take 1 tablet by mouth daily. 05/29/24  Yes Ellouise, Bakari Nikolai K, DO  albuterol  (VENTOLIN  HFA) 108 (90 Base) MCG/ACT inhaler Inhale 1-2 puffs into the lungs every 6 (six) hours as needed for wheezing or shortness of breath. 05/12/24   Yolande Lamar BROCKS, MD  amLODipine (NORVASC) 10 MG tablet Take 10 mg by mouth daily.    [provider]   APIXABAN WINN) VTE STARTER PACK (10MG  AND 5MG ) Take as directed on package: start with two-5mg  tablets twice daily for 7 days. On day 8, switch to one-5mg  tablet twice daily. 05/12/24   Yolande Lamar BROCKS, MD  COMIRNATY syringe  04/17/23   [provider]  FLUZONE HIGH-DOSE 0.5 ML injection  04/17/23   [provider]  lidocaine -prilocaine  (EMLA ) cream Apply 1 Application topically as needed. 08/25/23   Heilingoetter, Cassandra L, PA-C  memantine  (NAMENDA ) 10 MG tablet Take 1 tablet (10 mg total) by mouth 2 (two) times daily. 12/07/23   Lanell Donald Stagger, PA-C  memantine  (NAMENDA ) 5 MG tablet Begin this prescription the first day of brain radiation. Week 1: take one tablet po qam. Week 2: take one tablet qam and qpm. Week 3: take two tablets qam, and one tablet po q pm. Week 4: take two tablets qam and qpm. Fill subsequent prescription q month. 12/07/23   Lanell Donald Stagger, PA-C  prochlorperazine  (COMPAZINE ) 10 MG tablet Take 1 tablet (10 mg total) by mouth every 6 (six) hours as needed. Patient not taking: Reported on 04/23/2024 08/25/23   Heilingoetter, Cassandra L, PA-C    Allergies: Patient has no known allergies.    Review of Systems  Gastrointestinal:  Positive for abdominal pain.    Updated Vital Signs BP (!) 109/57 (BP Location: Right Arm)   Pulse 78   Temp 97.9 F (36.6 C) (  Oral)   Resp 12   SpO2 95%   Physical Exam Vitals and nursing [name] reviewed.  Constitutional:      General: He is not in acute distress.    Appearance: He is well-developed.  HENT:     Head: Normocephalic and atraumatic.     Mouth/Throat:     Mouth: Mucous membranes are moist.  Eyes:     Extraocular Movements: Extraocular movements intact.  Cardiovascular:     Rate and Rhythm: Normal rate and regular rhythm.     Heart sounds: Normal heart sounds.     Comments: Mild left-sided chest wall tenderness to palpation Pulmonary:     Effort: Pulmonary effort is normal.     Breath  sounds: Normal breath sounds.  Abdominal:     General: Abdomen is flat.     Palpations: Abdomen is soft.     Tenderness: There is no abdominal tenderness. There is no right CVA tenderness or left CVA tenderness.  Skin:    General: Skin is warm and dry.     Findings: No rash.  Neurological:     General: No focal deficit present.     Mental Status: He is alert and oriented to person, place, and time.  Psychiatric:        Mood and Affect: Mood normal.        Behavior: Behavior normal.     (all labs ordered are listed, but only abnormal results are displayed) Labs Reviewed  COMPREHENSIVE METABOLIC PANEL WITH GFR - Abnormal; Notable for the following components:      Result Value   Glucose, Bld 136 (*)    All other components within normal limits  CBC - Abnormal; Notable for the following components:   WBC 12.2 (*)    All other components within normal limits  URINALYSIS, ROUTINE W REFLEX MICROSCOPIC - Abnormal; Notable for the following components:   APPearance HAZY (*)    All other components within normal limits  I-STAT CHEM 8, ED - Abnormal; Notable for the following components:   Glucose, Bld 136 (*)    All other components within normal limits  LIPASE, BLOOD  PRO BRAIN NATRIURETIC PEPTIDE  TROPONIN T, HIGH SENSITIVITY    EKG: EKG Interpretation Date/Time:  Tuesday May 29 2024 17:41:22 EST Ventricular Rate:  76 PR Interval:  294 QRS Duration:  84 QT Interval:  368 QTC Calculation: 414 R Axis:   2  Text Interpretation: Sinus or ectopic atrial rhythm Prolonged PR interval Consider left atrial enlargement Abnormal R-wave progression, early transition No significant change since last tracing Confirmed by Ellouise Fine ([id number]) on 05/29/2024 5:46:14 PM  Radiology: [facility] Chest 2 View Result Date: 05/29/2024 CLINICAL DATA:  History of lung cancer and chemo/radiation, presenting with left-sided abdominal pain. EXAM: DG CHEST 2V COMPARISON:  May 12, 2024 FINDINGS:  There is stable right-sided venous Port-A-Cath positioning. The heart size and mediastinal contours are within normal limits. Stable bilateral perihilar opacities are noted within areas of suspected radiation fibrosis. Mild, stable left basilar atelectasis is seen. There is mild, stable elevation of the left hemidiaphragm. No pleural effusion or pneumothorax is identified. The visualized skeletal structures are unremarkable. IMPRESSION: 1. Stable bilateral perihilar opacities within areas of suspected radiation fibrosis. 2. Mild, stable left basilar atelectasis. Electronically Signed   By: Suzen Dials M.D.   On: 05/29/2024 18:11   CT ABDOMEN PELVIS W CONTRAST Result Date: 05/29/2024 CLINICAL DATA:  Left-sided abdominal pain. History of small cell lung cancer. EXAM: CT ABDOMEN  AND PELVIS WITH CONTRAST TECHNIQUE: Multidetector CT imaging of the abdomen and pelvis was performed using the standard protocol following bolus administration of intravenous contrast. RADIATION DOSE REDUCTION: This exam was performed according to the departmental dose-optimization program which includes automated exposure control, adjustment of the mA and/or kV according to patient size and/or use of iterative reconstruction technique. CONTRAST:  OMNIPAQUE  IOHEXOL  300 MG/ML  SOLN COMPARISON:  CT chest abdomen and pelvis 02/14/2024. FINDINGS: Lower chest: There are fibrotic changes and scarring in the lung bases. Hepatobiliary: No focal liver abnormality is seen. No gallstones, gallbladder wall thickening, or biliary dilatation. Pancreas: Previously identified pancreatic tail mass not definitely seen on the current study. No acute pancreatic findings. Spleen: Normal in size without focal abnormality. Adrenals/Urinary Tract: Previously identified nodule exophytic from the lateral left kidney is no longer seen. Bilateral renal cysts are otherwise stable. There is no hydronephrosis or perinephric fat stranding. The adrenal glands  and bladder are within normal limits. Stomach/Bowel: Stomach is within normal limits. Appendix appears normal. No evidence of bowel wall thickening, distention, or inflammatory changes. There is terminal ileum diverticulosis as seen on the prior study. Vascular/Lymphatic: Aortic atherosclerosis. No enlarged abdominal or pelvic lymph nodes. Reproductive: Prostate radiotherapy seeds are present. Other: No abdominal wall hernia or abnormality. No abdominopelvic ascites. Musculoskeletal: There are degenerative changes of the right hip and lumbar spine. IMPRESSION: 1. No acute localizing process in the abdomen or pelvis. 2. Previously identified pancreatic tail mass and left renal nodule are no longer seen. 3. Aortic atherosclerosis. Aortic Atherosclerosis (ICD10-I70.0). Electronically Signed   By: Greig Pique M.D.   On: 05/29/2024 16:39     Procedures   Medications Ordered in the ED  lidocaine  (LIDODERM ) 5 % 1-3 patch (3 patches Transdermal Patch Applied 05/29/24 1808)  iohexol  (OMNIPAQUE ) 300 MG/ML solution 100 mL (100 mLs Intravenous Contrast Given 05/29/24 1555)  acetaminophen  (TYLENOL ) tablet 1,000 mg (1,000 mg Oral Given 05/29/24 1808)    Clinical Course as of 05/29/24 1945  Tue May 29, 2024  1831 CXR with signs of radiation fibrosis. [VK]  1924 Troponin and BNP normal making ACS unlikely, no signs concerning for R heart strain or worsening PE.Suspect radiation associated pain. Patient recommended to follow up with oncology. [VK]    Clinical Course User Index [VK] Kingsley, Elek Holderness K, DO                                 Medical Decision Making This patient presents to the ED with chief complaint(s) of left-sided chest/abdominal pain, constipation with pertinent past medical history of lung cancer status post chemo and radiation, recent PE on Eliquis which further complicates the presenting complaint. The complaint involves an extensive differential diagnosis and also carries with it a high  risk of complications and morbidity.    The differential diagnosis includes ACS, arrhythmia, anemia, pneumonia, pneumothorax, pulmonary edema, pleural effusion, postradiation pain, PE less likely as he has been compliant on his Eliquis, constipation, nephrolithiasis, pyelonephritis, diverticulitis or other intra-abdominal infection  Additional history obtained: Additional history obtained from N/A Records reviewed outpatient oncology records, recent ED records  ED Course and Reassessment: On patient's arrival he is hemodynamically stable in no acute distress.  Was initially evaluated in triage and had labs, urine and CT abdomen and pelvis performed.  His labs showed a mild leukocytosis and was otherwise within normal range.  His CT did not show acute disease to explain his  symptoms and a nonsignificant stool burden with no stool in the rectum.  Patient is primarily complaining of pain in the chest to me and will have EKG, troponin and chest x-ray additionally performed.  He will be given pain control and will be closely reassessed.  Independent labs interpretation:  The following labs were independently interpreted: within normal range  Independent visualization of imaging: - I independently visualized the following imaging with scope of interpretation limited to determining acute life threatening conditions related to emergency care: CXR, CTAP, which revealed signs of radiation fibrosis on CXR, no significant abnormality on CTAP  Consultation: - Consulted or discussed management/test interpretation w/ external professional: N/A  Consideration for admission or further workup: Patient has no emergent conditions requiring admission or further work-up at this time and is stable for discharge home with primary care and oncology follow-up  Social Determinants of health: N/A    Amount and/or Complexity of Data Reviewed Labs: ordered. Radiology: ordered.  Risk OTC drugs. Prescription drug  management.       Final diagnoses:  Nonspecific chest pain  History of constipation    ED Discharge Orders          Ordered    senna-docusate (SENOKOT-S) 8.6-50 MG tablet  Daily        05/29/24 1943    psyllium (METAMUCIL SMOOTH TEXTURE) 58.6 % powder  3 times daily        05/29/24 1943               Kingsley, Shannen Vernon K, DO 05/29/24 1945

## 2024-05-29 NOTE — ED Triage Notes (Addendum)
 Pt BIB EMS from home for left sided abdominal pain. Pt has been constipated with no bowel movement x4 days and had chemo/radiation a month ago for lung cancer.

## 2024-06-06 ENCOUNTER — Other Ambulatory Visit: Payer: Self-pay

## 2024-06-08 ENCOUNTER — Other Ambulatory Visit: Payer: Self-pay | Admitting: Radiation Oncology

## 2024-06-10 ENCOUNTER — Encounter: Payer: Self-pay | Admitting: Internal Medicine

## 2024-06-12 ENCOUNTER — Other Ambulatory Visit: Payer: Self-pay | Admitting: Radiation Oncology

## 2024-06-18 ENCOUNTER — Inpatient Hospital Stay: Admitting: Internal Medicine

## 2024-06-18 ENCOUNTER — Inpatient Hospital Stay

## 2024-06-25 ENCOUNTER — Other Ambulatory Visit: Payer: Self-pay | Admitting: Physician Assistant

## 2024-06-25 ENCOUNTER — Other Ambulatory Visit: Payer: Self-pay

## 2024-06-25 DIAGNOSIS — C3411 Malignant neoplasm of upper lobe, right bronchus or lung: Secondary | ICD-10-CM

## 2024-06-26 ENCOUNTER — Inpatient Hospital Stay (HOSPITAL_BASED_OUTPATIENT_CLINIC_OR_DEPARTMENT_OTHER): Admitting: Internal Medicine

## 2024-06-26 ENCOUNTER — Inpatient Hospital Stay: Attending: Internal Medicine

## 2024-06-26 VITALS — BP 108/56 | HR 95 | Temp 97.8°F | Resp 17 | Ht 72.0 in | Wt 150.0 lb

## 2024-06-26 DIAGNOSIS — Z8546 Personal history of malignant neoplasm of prostate: Secondary | ICD-10-CM | POA: Insufficient documentation

## 2024-06-26 DIAGNOSIS — C3412 Malignant neoplasm of upper lobe, left bronchus or lung: Secondary | ICD-10-CM

## 2024-06-26 DIAGNOSIS — R112 Nausea with vomiting, unspecified: Secondary | ICD-10-CM | POA: Diagnosis not present

## 2024-06-26 DIAGNOSIS — Z5112 Encounter for antineoplastic immunotherapy: Secondary | ICD-10-CM | POA: Insufficient documentation

## 2024-06-26 DIAGNOSIS — Z79620 Long term (current) use of immunosuppressive biologic: Secondary | ICD-10-CM | POA: Diagnosis not present

## 2024-06-26 DIAGNOSIS — Z923 Personal history of irradiation: Secondary | ICD-10-CM | POA: Diagnosis not present

## 2024-06-26 DIAGNOSIS — Z8551 Personal history of malignant neoplasm of bladder: Secondary | ICD-10-CM | POA: Insufficient documentation

## 2024-06-26 DIAGNOSIS — C3411 Malignant neoplasm of upper lobe, right bronchus or lung: Secondary | ICD-10-CM | POA: Insufficient documentation

## 2024-06-26 LAB — CMP (CANCER CENTER ONLY)
ALT: 10 U/L (ref 0–44)
AST: 18 U/L (ref 15–41)
Albumin: 3.7 g/dL (ref 3.5–5.0)
Alkaline Phosphatase: 78 U/L (ref 38–126)
Anion gap: 7 (ref 5–15)
BUN: 11 mg/dL (ref 8–23)
CO2: 29 mmol/L (ref 22–32)
Calcium: 10 mg/dL (ref 8.9–10.3)
Chloride: 104 mmol/L (ref 98–111)
Creatinine: 0.93 mg/dL (ref 0.61–1.24)
GFR, Estimated: >60 mL/min (ref >60)
Glucose, Bld: 160 mg/dL — ABNORMAL HIGH (ref 70–99)
Potassium: 4.4 mmol/L (ref 3.5–5.1)
Sodium: 139 mmol/L (ref 135–145)
Total Bilirubin: 0.4 mg/dL (ref 0.0–1.2)
Total Protein: 6.8 g/dL (ref 6.5–8.1)

## 2024-06-26 LAB — CBC WITH DIFFERENTIAL (CANCER CENTER ONLY)
Abs Immature Granulocytes: 0.04 K/uL (ref 0.00–0.07)
Basophils Absolute: 0 K/uL (ref 0.0–0.1)
Basophils Relative: 0 %
Eosinophils Absolute: 0.2 K/uL (ref 0.0–0.5)
Eosinophils Relative: 1 %
HCT: 39 % (ref 39.0–52.0)
Hemoglobin: 13.1 g/dL (ref 13.0–17.0)
Immature Granulocytes: 0 %
Lymphocytes Relative: 6 %
Lymphs Abs: 0.6 K/uL — ABNORMAL LOW (ref 0.7–4.0)
MCH: 32 pg (ref 26.0–34.0)
MCHC: 33.6 g/dL (ref 30.0–36.0)
MCV: 95.1 fL (ref 80.0–100.0)
Monocytes Absolute: 1.2 K/uL — ABNORMAL HIGH (ref 0.1–1.0)
Monocytes Relative: 11 %
Neutro Abs: 8.9 K/uL — ABNORMAL HIGH (ref 1.7–7.7)
Neutrophils Relative %: 82 %
Platelet Count: 224 K/uL (ref 150–400)
RBC: 4.1 MIL/uL — ABNORMAL LOW (ref 4.22–5.81)
RDW: 12.5 % (ref 11.5–15.5)
WBC Count: 11 K/uL — ABNORMAL HIGH (ref 4.0–10.5)
nRBC: 0 % (ref 0.0–0.2)

## 2024-06-26 LAB — TSH: TSH: 0.649 u[IU]/mL (ref 0.350–4.500)

## 2024-06-26 NOTE — Progress Notes (Signed)
 [facility] [phone]   [phone]  OFFICE PROGRESS NOTE  Practice, [facility] [address] 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  Presented with a large left hilar/mediastinal mass, multiple bilateral pulmonary nodules, prevascular lymph node, and subcutaneous nodules consistent with metastatic disease.  He was diagnosed in January 2025. 3) history of prostate cancer in 2014 4) history of urothelial carcinoma in 2017 5) WBR under the care of Dr. Dewey, last day on 12/26/23   PRIOR THERAPY: 1)  palliative course of radiation to the left lung. The last days expected on 09/16/2023. Under the care of Dr. Dewey. Completed on 09/19/23  2) RUL was treated with a course of stereotactic body radiation treatment by Dr. Dewey on 12/07/23 to the synchronous stage I NSCLC 3) SBRT to the left kidney suspicious metastatic lesion under the care of Dr. Dewey completed April 27, 2024.  CURRENT THERAPY: 1) Palliative systemic chemotherapy with carboplatin  for an AUC of 5 on day 1 and etoposide  100 mg/m2 on days 1, 2 and 3, and Imfinzi  1500 mg IV every 3 weeks with neulsta support. First dose on 09/06/23. Status post 11 cycles.    INTERVAL HISTORY: EDON HOADLEY 78 y.o. male returns to the clinic today for follow-up visit accompanied by his niece. Discussed the use of AI scribe software for clinical note transcription with the patient, who gave verbal consent to proceed.  History of Present Illness John [name] is a 78 year old male with extensive stage small cell lung cancer who presents for pre-treatment evaluation prior to cycle twelve of maintenance durvalumab .  He was diagnosed with extensive stage small cell lung cancer of the left upper lobe in January 2025 and completed four cycles of carboplatin , etoposide , and  nivolumab. He has since been maintained on single-agent durvalumab  every four weeks, with eleven cycles completed to date.  He reports feeling well overall and denies new or significant complaints. He notes mild hematuria, which he attributes to recent painting activity, and has urinated twice today without dysuria. He is able to rest comfortably and does not have pain. He describes mild peripheral paresthesia, which is less severe than previously experienced. He denies chest pain, dyspnea, or other systemic symptoms.  He denies current nausea and has only experienced emesis once or twice over the past several weeks, with episodes separated by two to three weeks. He has antiemetic medication available at home if needed.  Recent abdominal and pelvic imaging was performed last month.    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  MEDICATIONS:  Current Outpatient Medications  Medication Sig Dispense Refill   albuterol  (VENTOLIN  HFA) 108 (90 Base) MCG/ACT inhaler Inhale 1-2 puffs into the lungs every 6 (six) hours as needed for wheezing or shortness of breath. 8 g  2   amLODipine (NORVASC) 10 MG tablet Take 10 mg by mouth daily.     APIXABAN  (ELIQUIS ) VTE STARTER PACK (10MG  AND 5MG ) Take as directed on package: start with two-5mg  tablets twice daily for 7 days. On day 8, switch to one-5mg  tablet twice daily. 74 each 0   COMIRNATY syringe      FLUZONE HIGH-DOSE 0.5 ML injection      lidocaine -prilocaine  (EMLA ) cream Apply 1 Application topically as needed. 30 g 2   memantine  (NAMENDA ) 10 MG tablet Take 1 tablet (10 mg total) by mouth 2 (two) times daily. 60 tablet 4   memantine  (NAMENDA ) 5 MG tablet Begin this prescription the first day of brain radiation. Week 1: take one tablet po qam. Week 2: take one tablet qam and qpm. Week 3: take two tablets qam, and one tablet po q pm. Week 4: take two tablets qam and qpm. Fill subsequent prescription q month. 70 tablet 0   prochlorperazine  (COMPAZINE ) 10 MG tablet Take 1 tablet (10 mg total) by mouth every 6 (six) hours as needed. (Patient not taking: Reported on 04/23/2024) 30 tablet 2   psyllium (METAMUCIL SMOOTH TEXTURE) 58.6 % powder Take 1 packet by mouth 3 (three) times daily. 283 g 12   senna-docusate (SENOKOT-S) 8.6-50 MG tablet Take 1 tablet by mouth daily. 30 tablet 0   No current facility-administered medications for this visit.    SURGICAL HISTORY:  Past Surgical History:  Procedure Laterality Date   ABDOMINAL HERNIA REPAIR  1990's   BRONCHIAL BIOPSY  08/08/2023   Procedure: BRONCHIAL BIOPSIES;  Surgeon: Shelah Lamar RAMAN, MD;  Location: [facility] ENDOSCOPY;  Service: Pulmonary;;   BRONCHIAL BRUSHINGS  08/08/2023   Procedure: BRONCHIAL BRUSHINGS;  Surgeon: Shelah Lamar RAMAN, MD;  Location: [facility] ENDOSCOPY;  Service: Pulmonary;;   BRONCHIAL NEEDLE ASPIRATION BIOPSY  08/08/2023   Procedure: BRONCHIAL NEEDLE ASPIRATION BIOPSIES;  Surgeon: Shelah Lamar RAMAN, MD;  Location: [facility] ENDOSCOPY;  Service: Pulmonary;;   CYSTOSCOPY W/ RETROGRADES N/A 11/03/2015   Procedure: CYSTOSCOPY WITH RETROGRADE PYELOGRAM;  Surgeon: Belvie LITTIE Clara, MD;  Location: [facility];  Service: Urology;  Laterality: N/A;   CYSTOSCOPY W/ RETROGRADES Left 01/09/2016   Procedure: CYSTOSCOPY WITH LEFT RETROGRADE PYELOGRAM;  Surgeon: Belvie LITTIE Clara, MD;  Location: [facility];  Service: Urology;  Laterality: Left;   CYSTOSCOPY W/ RETROGRADES Bilateral 05/28/2016   Procedure: CYSTOSCOPY WITH RETROGRADE PYELOGRAM;  Surgeon: Belvie LITTIE Clara, MD;  Location: [facility];  Service: Urology;  Laterality: Bilateral;   CYSTOSCOPY W/ RETROGRADES Bilateral 02/17/2021   Procedure: CYSTOSCOPY WITH RETROGRADE PYELOGRAM;  Surgeon: Rosalind Zachary NOVAK, MD;  Location: [facility];  Service: Urology;  Laterality: Bilateral;   CYSTOSCOPY W/ URETERAL STENT REMOVAL Left 01/09/2016   Procedure: CYSTOSCOPY WITH LEFT STENT EXCHANGE;  Surgeon: Belvie LITTIE Clara, MD;  Location: [facility];  Service: Urology;  Laterality: Left;   CYSTOSCOPY WITH BIOPSY N/A 05/28/2016   Procedure: CYSTOSCOPY WITH BIOPSY;  Surgeon: Belvie LITTIE Clara, MD;  Location: [facility];  Service: Urology;  Laterality: N/A;  CYSTOSCOPY WITH URETHRAL DILATATION N/A 05/28/2016   Procedure: CYSTOSCOPY WITH URETHRAL DILATATION;  Surgeon: Belvie LITTIE Clara, MD;  Location: [facility];  Service: Urology;  Laterality: N/A;   HEMOSTASIS CONTROL  08/08/2023   Procedure: HEMOSTASIS CONTROL;  Surgeon: Shelah Lamar RAMAN, MD;  Location: [facility] ENDOSCOPY;  Service: Pulmonary;;   IR IMAGING GUIDED PORT INSERTION  09/19/2023   PROSTATE BIOPSY  05/08/12   Adenocarcinoma   RADIOACTIVE SEED IMPLANT  08/01/2012   Procedure: RADIOACTIVE SEED IMPLANT;  Surgeon: Thomasine Oiler, MD;  Location: [facility];  Service: Urology;  Laterality: N/A;  73 seeds implanted no seeds found in bladder   TRANSURETHRAL RESECTION OF BLADDER TUMOR N/A 11/03/2015   Procedure: TRANSURETHRAL RESECTION OF BLADDER TUMOR  (TURBT)WITH INSERTION STENT;  Surgeon: Belvie LITTIE Clara, MD;  Location: [facility];  Service: Urology;  Laterality: N/A;   TRANSURETHRAL RESECTION OF BLADDER TUMOR N/A 02/17/2021   Procedure: TRANSURETHRAL RESECTION OF BLADDER TUMOR (TURBT);  Surgeon: Rosalind Zachary NOVAK, MD;  Location: [facility];  Service: Urology;  Laterality: N/A;  30 MINS   TRANSURETHRAL RESECTION OF BLADDER TUMOR WITH GYRUS (TURBT-GYRUS) N/A 01/09/2016   Procedure: TRANSURETHRAL RESECTION OF BLADDER TUMOR WITH GYRUS (TURBT-GYRUS);  Surgeon: Belvie LITTIE Clara, MD;  Location: [facility];  Service: Urology;  Laterality: N/A;   VIDEO BRONCHOSCOPY WITH RADIAL ENDOBRONCHIAL ULTRASOUND  08/08/2023   Procedure: VIDEO BRONCHOSCOPY WITH RADIAL ENDOBRONCHIAL ULTRASOUND;  Surgeon: Shelah Lamar RAMAN, MD;  Location: [facility] ENDOSCOPY;  Service: Pulmonary;;    REVIEW OF SYSTEMS:  A comprehensive review of systems was negative except for: Constitutional: positive for fatigue Gastrointestinal: positive for nausea   PHYSICAL EXAMINATION: General appearance: alert, cooperative, fatigued, and no distress Head: Normocephalic, without obvious abnormality, atraumatic Neck: no adenopathy, no JVD, supple, symmetrical, trachea midline, and thyroid  not enlarged, symmetric, no tenderness/mass/nodules Lymph nodes: Cervical, supraclavicular, and axillary nodes normal. Resp: clear to auscultation bilaterally Back: symmetric, no curvature. ROM normal. No CVA tenderness. Cardio: regular rate and rhythm, S1, S2 normal, no murmur, click, rub or gallop GI: soft, non-tender; bowel sounds normal; no masses,  no organomegaly Extremities: extremities normal, atraumatic, no cyanosis or edema  ECOG PERFORMANCE STATUS: 1 - Symptomatic but completely ambulatory  Blood pressure (!) 108/56, pulse 95, temperature 97.8 F (36.6 C), temperature source Temporal, resp. rate 17, height 6' (1.829 m), weight 150 lb (68 kg), SpO2  96%.  LABORATORY DATA: Lab Results  Component Value Date   WBC 12.2 (H) 05/29/2024   HGB 14.6 05/29/2024   HCT 43.0 05/29/2024   MCV 98.8 05/29/2024   PLT 226 05/29/2024      Chemistry      Component Value Date/Time   NA 142 05/29/2024 1501   K 4.0 05/29/2024 1501   CL 103 05/29/2024 1501   CO2 28 05/29/2024 1450   BUN 16 05/29/2024 1501   CREATININE 1.00 05/29/2024 1501   CREATININE 0.88 05/21/2024 0825      Component Value Date/Time   CALCIUM 10.2 05/29/2024 1450   ALKPHOS 78 05/29/2024 1450   AST 20 05/29/2024 1450   AST 16 05/21/2024 0825   ALT 13 05/29/2024 1450   ALT 20 05/21/2024 0825   BILITOT 0.4 05/29/2024 1450   BILITOT 0.5 05/21/2024 0825       RADIOGRAPHIC STUDIES: DG Chest 2 View Result Date: 05/29/2024 CLINICAL DATA:  History of lung cancer and chemo/radiation, presenting with left-sided abdominal pain. EXAM: DG CHEST 2V COMPARISON:  May 12, 2024 FINDINGS: There is stable right-sided venous Port-A-Cath positioning. The heart size and mediastinal contours are within normal limits. Stable bilateral perihilar opacities are noted within areas of suspected radiation fibrosis. Mild, stable left basilar atelectasis is seen. There is mild, stable elevation of the left hemidiaphragm. No pleural effusion or pneumothorax is identified. The visualized skeletal structures are unremarkable. IMPRESSION: 1. Stable bilateral perihilar opacities within areas of suspected radiation fibrosis. 2. Mild, stable left basilar atelectasis. Electronically Signed   By: Suzen Dials M.D.   On: 05/29/2024 18:11   CT ABDOMEN PELVIS W CONTRAST Result Date: 05/29/2024 CLINICAL DATA:  Left-sided abdominal pain. History of small cell lung cancer. EXAM: CT ABDOMEN AND PELVIS WITH CONTRAST TECHNIQUE: Multidetector CT imaging of the abdomen and pelvis was performed using the standard protocol following bolus administration of intravenous contrast. RADIATION DOSE REDUCTION: This exam was  performed according to the departmental dose-optimization program which includes automated exposure control, adjustment of the mA and/or kV according to patient size and/or use of iterative reconstruction technique. CONTRAST:  OMNIPAQUE  IOHEXOL  300 MG/ML  SOLN COMPARISON:  CT chest abdomen and pelvis 02/14/2024. FINDINGS: Lower chest: There are fibrotic changes and scarring in the lung bases. Hepatobiliary: No focal liver abnormality is seen. No gallstones, gallbladder wall thickening, or biliary dilatation. Pancreas: Previously identified pancreatic tail mass not definitely seen on the current study. No acute pancreatic findings. Spleen: Normal in size without focal abnormality. Adrenals/Urinary Tract: Previously identified nodule exophytic from the lateral left kidney is no longer seen. Bilateral renal cysts are otherwise stable. There is no hydronephrosis or perinephric fat stranding. The adrenal glands and bladder are within normal limits. Stomach/Bowel: Stomach is within normal limits. Appendix appears normal. No evidence of bowel wall thickening, distention, or inflammatory changes. There is terminal ileum diverticulosis as seen on the prior study. Vascular/Lymphatic: Aortic atherosclerosis. No enlarged abdominal or pelvic lymph nodes. Reproductive: Prostate radiotherapy seeds are present. Other: No abdominal wall hernia or abnormality. No abdominopelvic ascites. Musculoskeletal: There are degenerative changes of the right hip and lumbar spine. IMPRESSION: 1. No acute localizing process in the abdomen or pelvis. 2. Previously identified pancreatic tail mass and left renal nodule are no longer seen. 3. Aortic atherosclerosis. Aortic Atherosclerosis (ICD10-I70.0). Electronically Signed   By: Greig Pique M.D.   On: 05/29/2024 16:39    ASSESSMENT AND PLAN:  This is a very pleasant 78 years old African-American male with  extensive stage small cell lung cancer.  Presented with a large left  hilar/mediastinal mass, multiple bilateral pulmonary nodules, prevascular lymph node, and subcutaneous nodules consistent with metastatic disease.  He was diagnosed in January 2025.  He also has a history of a stage I adenocarcinoma of the right upper lobe status post SBRT. He is currently undergoing systemic chemotherapy with carboplatin  for AUC of 5 on day 1, etoposide  100 Mg/M2 on days 1, 2 and 3 with Neulasta  support in addition to Imfinzi  1500 Mg IV on day 1 every 3 weeks.  He is status post 11 cycles.  Starting from cycle #5 the patient is on maintenance treatment with Imfinzi  1500 Mg IV every 4 weeks. Assessment and Plan Assessment & Plan Extensive stage small cell lung cancer of the left upper lobe He is clinically well-managed on maintenance durvalumab , with no significant new symptoms and good therapy tolerance. Abdominal imaging showed near resolution of the previously concerning pancreatic lesion. Laboratory studies were within acceptable parameters. He reports only mild intermittent paresthesias and infrequent nausea or vomiting, both improved. -  Administered cycle twelve of maintenance durvalumab  today. - Reviewed laboratory results, all within acceptable limits. - Provided antiemetic therapy as needed for nausea or vomiting. - Scheduled follow-up in four weeks for next cycle and reassessment. He was advised to call immediately if he has any other concerning symptoms in the interval. The patient voices understanding of current disease status and treatment options and is in agreement with the current care plan.  All questions were answered. The patient knows to call the clinic with any problems, questions or concerns. We can certainly see the patient much sooner if necessary. The total time spent in the appointment was 20 minutes including review of chart and various tests results, discussions about plan of care and coordination of care plan .   Disclaimer: This note was dictated with  voice recognition software. Similar sounding words can inadvertently be transcribed and may not be corrected upon review.

## 2024-06-27 ENCOUNTER — Inpatient Hospital Stay

## 2024-06-27 VITALS — BP 111/56 | HR 101 | Temp 98.7°F | Resp 18

## 2024-06-27 DIAGNOSIS — Z5112 Encounter for antineoplastic immunotherapy: Secondary | ICD-10-CM | POA: Diagnosis not present

## 2024-06-27 DIAGNOSIS — C3412 Malignant neoplasm of upper lobe, left bronchus or lung: Secondary | ICD-10-CM

## 2024-06-27 LAB — T4: T4, Total: 8.1 ug/dL (ref 4.5–12.0)

## 2024-06-27 MED ORDER — SODIUM CHLORIDE 0.9 % IV SOLN: INTRAVENOUS | Status: DC

## 2024-06-27 MED ORDER — SODIUM CHLORIDE 0.9 % IV SOLN
1500.0000 mg | Freq: Once | INTRAVENOUS | Status: AC
  Administered 2024-06-27: 15:00:00 1500 mg via INTRAVENOUS
  Filled 2024-06-27: qty 30

## 2024-06-27 NOTE — Patient Instructions (Signed)

## 2024-07-10 ENCOUNTER — Other Ambulatory Visit (HOSPITAL_COMMUNITY): Payer: Self-pay | Admitting: Nurse Practitioner

## 2024-07-10 ENCOUNTER — Ambulatory Visit (HOSPITAL_COMMUNITY)
Admission: RE | Admit: 2024-07-10 | Discharge: 2024-07-10 | Disposition: A | Discharge status: Home / Self Care | Source: Ambulatory Visit | Attending: Nurse Practitioner | Admitting: Nurse Practitioner

## 2024-07-10 DIAGNOSIS — C3492 Malignant neoplasm of unspecified part of left bronchus or lung: Secondary | ICD-10-CM | POA: Insufficient documentation

## 2024-07-11 NOTE — Progress Notes (Signed)
 BNP ordered to evaluate for volume overload as cause of symptoms

## 2024-07-16 ENCOUNTER — Inpatient Hospital Stay: Admitting: Physician Assistant

## 2024-07-16 ENCOUNTER — Inpatient Hospital Stay

## 2024-07-17 ENCOUNTER — Encounter: Payer: Self-pay | Admitting: Internal Medicine

## 2024-07-18 NOTE — Progress Notes (Unsigned)
 [facility] OFFICE PROGRESS NOTE  Practice, [facility] [address] 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  Presented with a large left hilar/mediastinal mass, multiple bilateral pulmonary nodules, prevascular lymph node, and subcutaneous nodules consistent with metastatic disease.  He was diagnosed in January 2025. 3) history of prostate cancer in 2014 4) history of urothelial carcinoma in 2017  PRIOR THERAPY: 1)  palliative course of radiation to the left lung. The last days expected on 09/16/2023. Under the care of Dr. Dewey. Completed on 09/19/23  2) RUL was treated with a course of stereotactic body radiation treatment by Dr. Dewey on 12/07/23 to the synchronous stage I NSCLC 3) WBR under the care of Dr. Dewey, last day on 12/26/23  4) SBRT to the soft tissue nodule adjacent to the left kidney, completed on 04/27/24    CURRENT THERAPY: 1) Palliative systemic chemotherapy with carboplatin  for an AUC of 5 on day 1 and etoposide  100 mg/m2 on days 1, 2 and 3, and Imfinzi  1500 mg IV every 3 weeks with neulsta support. First dose on 09/06/23. Status post 12 cycles.   INTERVAL HISTORY: John [name] 79 y.o. male returns  to the clinic today for a follow-up visit. He was last seen in the clinic on 06/26/24 by Dr. Sherrod.   The patient was found to have extensive stage small cell lung cancer in addition to synchronous early-stage non-small cell lung cancer.    He completed palliative radiation to the left lung under the care of Dr. Dewey and SBRT to the synchronous stage I non-small cell lung cancer.  He is status post 4 cycles of chemotherapy and immunotherapy and tolerates it well. He started maintenance treatment with immunotherapy and has been tolerating that well.   Problem the patient is feeling ***today.  He denies any fever, chills,  night sweats, or unexplained weight loss.  He denies any chest pain, shortness of breath, cough, or hemoptysis.  Denies any nausea, vomiting, diarrhea, melena or constipation.  Denies any headache or visual changes.  At his last appointment with Dr. Sherrod he was endorsing mild hematuria.  He is here today for evaluation and repeat blood work for undergoing cycle number 13.   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  MEDICATIONS:  Current Outpatient Medications  Medication Sig Dispense Refill   albuterol  (VENTOLIN  HFA) 108 (90 Base) MCG/ACT inhaler Inhale 1-2 puffs into the  lungs every 6 (six) hours as needed for wheezing or shortness of breath. 8 g 2   amLODipine (NORVASC) 10 MG tablet Take 10 mg by mouth daily.     APIXABAN  (ELIQUIS ) VTE STARTER PACK (10MG  AND 5MG ) Take as directed on package: start with two-5mg  tablets twice daily for 7 days. On day 8, switch to one-5mg  tablet twice daily. 74 each 0   COMIRNATY syringe      FLUZONE HIGH-DOSE 0.5 ML injection      lidocaine -prilocaine  (EMLA ) cream Apply 1 Application topically as needed. 30 g 2   memantine   (NAMENDA ) 10 MG tablet Take 1 tablet (10 mg total) by mouth 2 (two) times daily. 60 tablet 4   memantine  (NAMENDA ) 5 MG tablet Begin this prescription the first day of brain radiation. Week 1: take one tablet po qam. Week 2: take one tablet qam and qpm. Week 3: take two tablets qam, and one tablet po q pm. Week 4: take two tablets qam and qpm. Fill subsequent prescription q month. 70 tablet 0   prochlorperazine  (COMPAZINE ) 10 MG tablet Take 1 tablet (10 mg total) by mouth every 6 (six) hours as needed. (Patient not taking: Reported on 04/23/2024) 30 tablet 2   psyllium (METAMUCIL SMOOTH TEXTURE) 58.6 % powder Take 1 packet by mouth 3 (three) times daily. 283 g 12   senna-docusate (SENOKOT-S) 8.6-50 MG tablet Take 1 tablet by mouth daily. 30 tablet 0   No current facility-administered medications for this visit.    SURGICAL HISTORY:  Past Surgical History:  Procedure Laterality Date   ABDOMINAL HERNIA REPAIR  1990's   BRONCHIAL BIOPSY  08/08/2023   Procedure: BRONCHIAL BIOPSIES;  Surgeon: Shelah Lamar RAMAN, MD;  Location: [facility] ENDOSCOPY;  Service: Pulmonary;;   BRONCHIAL BRUSHINGS  08/08/2023   Procedure: BRONCHIAL BRUSHINGS;  Surgeon: Shelah Lamar RAMAN, MD;  Location: [facility] ENDOSCOPY;  Service: Pulmonary;;   BRONCHIAL NEEDLE ASPIRATION BIOPSY  08/08/2023   Procedure: BRONCHIAL NEEDLE ASPIRATION BIOPSIES;  Surgeon: Shelah Lamar RAMAN, MD;  Location: [facility] ENDOSCOPY;  Service: Pulmonary;;   CYSTOSCOPY W/ RETROGRADES N/A 11/03/2015   Procedure: CYSTOSCOPY WITH RETROGRADE PYELOGRAM;  Surgeon: Belvie LITTIE Clara, MD;  Location: [facility];  Service: Urology;  Laterality: N/A;   CYSTOSCOPY W/ RETROGRADES Left 01/09/2016   Procedure: CYSTOSCOPY WITH LEFT RETROGRADE PYELOGRAM;  Surgeon: Belvie LITTIE Clara, MD;  Location: [facility];  Service: Urology;  Laterality: Left;   CYSTOSCOPY W/ RETROGRADES Bilateral 05/28/2016   Procedure: CYSTOSCOPY WITH RETROGRADE PYELOGRAM;  Surgeon: Belvie LITTIE Clara, MD;  Location: [facility];  Service: Urology;  Laterality: Bilateral;   CYSTOSCOPY W/ RETROGRADES Bilateral 02/17/2021   Procedure: CYSTOSCOPY WITH RETROGRADE PYELOGRAM;  Surgeon: Rosalind Zachary NOVAK, MD;  Location: [facility];  Service: Urology;  Laterality: Bilateral;   CYSTOSCOPY W/ URETERAL STENT REMOVAL Left 01/09/2016   Procedure: CYSTOSCOPY WITH LEFT STENT EXCHANGE;  Surgeon: Belvie LITTIE Clara, MD;  Location: [facility];  Service: Urology;  Laterality: Left;   CYSTOSCOPY WITH BIOPSY N/A 05/28/2016   Procedure: CYSTOSCOPY WITH BIOPSY;  Surgeon: Belvie LITTIE Clara, MD;  Location: [facility];  Service: Urology;  Laterality: N/A;   CYSTOSCOPY WITH URETHRAL DILATATION N/A 05/28/2016   Procedure: CYSTOSCOPY WITH URETHRAL DILATATION;  Surgeon: Belvie LITTIE Clara, MD;  Location: [facility];  Service: Urology;  Laterality: N/A;   HEMOSTASIS CONTROL  08/08/2023   Procedure: HEMOSTASIS CONTROL;  Surgeon: Shelah Lamar RAMAN, MD;  Location:  [facility] ENDOSCOPY;  Service: Pulmonary;;   IR IMAGING GUIDED PORT INSERTION  09/19/2023   PROSTATE BIOPSY  05/08/12   Adenocarcinoma   RADIOACTIVE SEED IMPLANT  08/01/2012   Procedure: RADIOACTIVE SEED IMPLANT;  Surgeon: Thomasine Oiler, MD;  Location: [facility];  Service: Urology;  Laterality: N/A;  73 seeds implanted no seeds found in bladder   TRANSURETHRAL RESECTION OF BLADDER TUMOR N/A 11/03/2015   Procedure: TRANSURETHRAL RESECTION OF BLADDER TUMOR (TURBT)WITH INSERTION STENT;  Surgeon: Belvie LITTIE Clara, MD;  Location: [facility];  Service: Urology;  Laterality: N/A;   TRANSURETHRAL RESECTION OF BLADDER TUMOR N/A 02/17/2021   Procedure: TRANSURETHRAL RESECTION OF BLADDER TUMOR (TURBT);  Surgeon: Rosalind Zachary NOVAK, MD;  Location: [facility];  Service: Urology;  Laterality: N/A;  30 MINS   TRANSURETHRAL RESECTION OF BLADDER TUMOR WITH GYRUS  (TURBT-GYRUS) N/A 01/09/2016   Procedure: TRANSURETHRAL RESECTION OF BLADDER TUMOR WITH GYRUS (TURBT-GYRUS);  Surgeon: Belvie LITTIE Clara, MD;  Location: [facility];  Service: Urology;  Laterality: N/A;   VIDEO BRONCHOSCOPY WITH RADIAL ENDOBRONCHIAL ULTRASOUND  08/08/2023   Procedure: VIDEO BRONCHOSCOPY WITH RADIAL ENDOBRONCHIAL ULTRASOUND;  Surgeon: Shelah Lamar RAMAN, MD;  Location: [facility] ENDOSCOPY;  Service: Pulmonary;;    REVIEW OF SYSTEMS:   Review of Systems  Constitutional: Negative for appetite change, chills, fatigue, fever and unexpected weight change.  HENT:   Negative for mouth sores, nosebleeds, sore throat and trouble swallowing.   Eyes: Negative for eye problems and icterus.  Respiratory: Negative for cough, hemoptysis, shortness of breath and wheezing.   Cardiovascular: Negative for chest pain and leg swelling.  Gastrointestinal: Negative for abdominal pain, constipation, diarrhea, nausea and vomiting.  Genitourinary: Negative for bladder incontinence, difficulty urinating, dysuria, frequency and hematuria.   Musculoskeletal: Negative for back pain, gait problem, neck pain and neck stiffness.  Skin: Negative for itching and rash.  Neurological: Negative for dizziness, extremity weakness, gait problem, headaches, light-headedness and seizures.  Hematological: Negative for adenopathy. Does not bruise/bleed easily.  Psychiatric/Behavioral: Negative for confusion, depression and sleep disturbance. The patient is not nervous/anxious.     PHYSICAL EXAMINATION:  There were no vitals taken for this visit.  ECOG PERFORMANCE STATUS: {CHL ONC ECOG [id number]  Physical Exam  Constitutional: Oriented to person, place, and time and well-developed, well-nourished, and in no distress. No distress.  HENT:  Head: Normocephalic and atraumatic.  Mouth/Throat: Oropharynx is clear and moist. No oropharyngeal exudate.  Eyes: Conjunctivae are normal. Right eye exhibits no  discharge. Left eye exhibits no discharge. No scleral icterus.  Neck: Normal range of motion. Neck supple.  Cardiovascular: Normal rate, regular rhythm, normal heart sounds and intact distal pulses.   Pulmonary/Chest: Effort normal and breath sounds normal. No respiratory distress. No wheezes. No rales.  Abdominal: Soft. Bowel sounds are normal. Exhibits no distension and no mass. There is no tenderness.  Musculoskeletal: Normal range of motion. Exhibits no edema.  Lymphadenopathy:    No cervical adenopathy.  Neurological: Alert and oriented to person, place, and time. Exhibits normal muscle tone. Gait normal. Coordination normal.  Skin: Skin is warm and dry. No rash noted. Not diaphoretic. No erythema. No pallor.  Psychiatric: Mood, memory and judgment normal.  Vitals reviewed.  LABORATORY DATA: Lab Results  Component Value Date   WBC 11.0 (H) 06/26/2024   HGB 13.1 06/26/2024   HCT 39.0 06/26/2024   MCV 95.1 06/26/2024   PLT 224 06/26/2024      Chemistry  Component Value Date/Time   NA 139 06/26/2024 0831   K 4.4 06/26/2024 0831   CL 104 06/26/2024 0831   CO2 29 06/26/2024 0831   BUN 11 06/26/2024 0831   CREATININE 0.93 06/26/2024 0831      Component Value Date/Time   CALCIUM 10.0 06/26/2024 0831   ALKPHOS 78 06/26/2024 0831   AST 18 06/26/2024 0831   ALT 10 06/26/2024 0831   BILITOT 0.4 06/26/2024 0831       RADIOGRAPHIC STUDIES:  No results found.   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  Presented with a large left hilar/mediastinal mass, multiple bilateral pulmonary nodules, prevascular lymph node, and subcutaneous nodules consistent with metastatic disease.  He was diagnosed in January 2025. 3) history of prostate cancer in 2014 4) history of urothelial carcinoma in  2017  Underwent palliative radiation to the left lung which was completed in March 2025.  He also underwent SBRT to the synchronous stage I non-small cell lung cancer.  For systemic treatment he was on carboplatin  for an AUC of 5 on day 1, etoposide  100 mg/m on days 1, 2, and 3 and Imfinzi  1500 mg IV every 3 weeks on day 1.  He completed 4 cycles.  He is currently on single agent maintenance immunotherapy with Imfinzi  which was started from cycle #5.  This is IV every 4 weeks.  He is status post 12 cycles of treatment total  He completed whole brain radiation on 12/26/2023  He underwent SBRT to a lesion adjacent to the left kidney in October 2025 by Dr. Dewey.  Labs were reviewed.  Recommend that he ***#13 today scheduled.  We will see the patient back for evaluation and repeat blood work before undergoing cycle #14.  The patient was advised to call immediately if he has any concerning symptoms in the interval. The patient voices understanding of current disease status and treatment options and is in agreement with the current care plan. All questions were answered. The patient knows to call the clinic with any problems, questions or concerns. We can certainly see the patient much sooner if necessary     No orders of the defined types were placed in this encounter.    I spent {CHL ONC TIME VISIT - DTPQU:[id number]} counseling the patient face to face. The total time spent in the appointment was {CHL ONC TIME VISIT - DTPQU:[id number]}.  Khalie Wince L Atzel Mccambridge, PA-C 07/18/2024

## 2024-07-21 ENCOUNTER — Other Ambulatory Visit: Payer: Self-pay

## 2024-07-23 ENCOUNTER — Other Ambulatory Visit: Payer: Self-pay | Admitting: Physician Assistant

## 2024-07-23 ENCOUNTER — Telehealth: Payer: Self-pay | Admitting: *Deleted

## 2024-07-23 DIAGNOSIS — C3412 Malignant neoplasm of upper lobe, left bronchus or lung: Secondary | ICD-10-CM

## 2024-07-24 ENCOUNTER — Encounter: Payer: Self-pay | Admitting: Internal Medicine

## 2024-07-24 ENCOUNTER — Inpatient Hospital Stay: Admitting: Physician Assistant

## 2024-07-24 ENCOUNTER — Inpatient Hospital Stay

## 2024-07-24 NOTE — Telephone Encounter (Signed)
 done

## 2024-07-25 ENCOUNTER — Emergency Department (HOSPITAL_COMMUNITY)

## 2024-07-25 ENCOUNTER — Encounter: Payer: Self-pay | Admitting: Internal Medicine

## 2024-07-25 ENCOUNTER — Inpatient Hospital Stay (HOSPITAL_COMMUNITY): Admission: EM | Admit: 2024-07-25 | Source: Home / Self Care | Attending: Family Medicine | Admitting: Family Medicine

## 2024-07-25 ENCOUNTER — Other Ambulatory Visit: Payer: Self-pay

## 2024-07-25 DIAGNOSIS — Z66 Do not resuscitate: Secondary | ICD-10-CM

## 2024-07-25 DIAGNOSIS — Z515 Encounter for palliative care: Secondary | ICD-10-CM

## 2024-07-25 DIAGNOSIS — C3490 Malignant neoplasm of unspecified part of unspecified bronchus or lung: Secondary | ICD-10-CM

## 2024-07-25 DIAGNOSIS — Z7189 Other specified counseling: Secondary | ICD-10-CM

## 2024-07-25 DIAGNOSIS — J188 Other pneumonia, unspecified organism: Secondary | ICD-10-CM | POA: Diagnosis present

## 2024-07-25 DIAGNOSIS — J9601 Acute respiratory failure with hypoxia: Principal | ICD-10-CM

## 2024-07-25 DIAGNOSIS — R531 Weakness: Secondary | ICD-10-CM

## 2024-07-25 DIAGNOSIS — R4589 Other symptoms and signs involving emotional state: Secondary | ICD-10-CM

## 2024-07-25 DIAGNOSIS — R0602 Shortness of breath: Secondary | ICD-10-CM

## 2024-07-25 DIAGNOSIS — A419 Sepsis, unspecified organism: Secondary | ICD-10-CM

## 2024-07-25 DIAGNOSIS — J984 Other disorders of lung: Secondary | ICD-10-CM

## 2024-07-25 DIAGNOSIS — Z79899 Other long term (current) drug therapy: Secondary | ICD-10-CM

## 2024-07-25 DIAGNOSIS — Z85118 Personal history of other malignant neoplasm of bronchus and lung: Secondary | ICD-10-CM

## 2024-07-25 LAB — COMPREHENSIVE METABOLIC PANEL WITH GFR
ALT: 11 U/L (ref 0–44)
AST: 27 U/L (ref 15–41)
Albumin: 3.4 g/dL — ABNORMAL LOW (ref 3.5–5.0)
Alkaline Phosphatase: 123 U/L (ref 38–126)
Anion gap: 7 (ref 5–15)
BUN: 17 mg/dL (ref 8–23)
CO2: 29 mmol/L (ref 22–32)
Calcium: 10.3 mg/dL (ref 8.9–10.3)
Chloride: 104 mmol/L (ref 98–111)
Creatinine, Ser: 0.92 mg/dL (ref 0.61–1.24)
GFR, Estimated: >60 mL/min
Glucose, Bld: 197 mg/dL — ABNORMAL HIGH (ref 70–99)
Potassium: 4.1 mmol/L (ref 3.5–5.1)
Sodium: 139 mmol/L (ref 135–145)
Total Bilirubin: 0.5 mg/dL (ref 0.0–1.2)
Total Protein: 7.3 g/dL (ref 6.5–8.1)

## 2024-07-25 LAB — RESP PANEL BY RT-PCR (RSV, FLU A&B, COVID)  RVPGX2
Influenza A by PCR: NEGATIVE
Influenza B by PCR: NEGATIVE
Resp Syncytial Virus by PCR: NEGATIVE
SARS Coronavirus 2 by RT PCR: NEGATIVE

## 2024-07-25 LAB — PRO BRAIN NATRIURETIC PEPTIDE: Pro Brain Natriuretic Peptide: 380 pg/mL — ABNORMAL HIGH

## 2024-07-25 LAB — URINALYSIS, W/ REFLEX TO CULTURE (INFECTION SUSPECTED)
Bacteria, UA: NONE SEEN
Bilirubin Urine: NEGATIVE
Glucose, UA: NEGATIVE mg/dL
Hgb urine dipstick: NEGATIVE
Ketones, ur: NEGATIVE mg/dL
Leukocytes,Ua: NEGATIVE
Nitrite: NEGATIVE
Protein, ur: NEGATIVE mg/dL
Specific Gravity, Urine: 1.035 — ABNORMAL HIGH (ref 1.005–1.030)
pH: 7 (ref 5.0–8.0)

## 2024-07-25 LAB — CBC WITH DIFFERENTIAL/PLATELET
Abs Immature Granulocytes: 0.09 K/uL — ABNORMAL HIGH (ref 0.00–0.07)
Basophils Absolute: 0 K/uL (ref 0.0–0.1)
Basophils Relative: 0 %
Eosinophils Absolute: 0 K/uL (ref 0.0–0.5)
Eosinophils Relative: 0 %
HCT: 42.3 % (ref 39.0–52.0)
Hemoglobin: 13.7 g/dL (ref 13.0–17.0)
Immature Granulocytes: 1 %
Lymphocytes Relative: 3 %
Lymphs Abs: 0.4 K/uL — ABNORMAL LOW (ref 0.7–4.0)
MCH: 31.3 pg (ref 26.0–34.0)
MCHC: 32.4 g/dL (ref 30.0–36.0)
MCV: 96.6 fL (ref 80.0–100.0)
Monocytes Absolute: 1.1 K/uL — ABNORMAL HIGH (ref 0.1–1.0)
Monocytes Relative: 8 %
Neutro Abs: 13.3 K/uL — ABNORMAL HIGH (ref 1.7–7.7)
Neutrophils Relative %: 88 %
Platelets: 240 K/uL (ref 150–400)
RBC: 4.38 MIL/uL (ref 4.22–5.81)
RDW: 12.9 % (ref 11.5–15.5)
WBC: 15 K/uL — ABNORMAL HIGH (ref 4.0–10.5)
nRBC: 0 % (ref 0.0–0.2)

## 2024-07-25 LAB — I-STAT CHEM 8, ED
BUN: 16 mg/dL (ref 8–23)
Calcium, Ion: 1.26 mmol/L (ref 1.15–1.40)
Chloride: 103 mmol/L (ref 98–111)
Creatinine, Ser: 1 mg/dL (ref 0.61–1.24)
Glucose, Bld: 195 mg/dL — ABNORMAL HIGH (ref 70–99)
HCT: 41 % (ref 39.0–52.0)
Hemoglobin: 13.9 g/dL (ref 13.0–17.0)
Potassium: 4.1 mmol/L (ref 3.5–5.1)
Sodium: 139 mmol/L (ref 135–145)
TCO2: 26 mmol/L (ref 22–32)

## 2024-07-25 LAB — STREP PNEUMONIAE URINARY ANTIGEN: Strep Pneumo Urinary Antigen: NEGATIVE

## 2024-07-25 LAB — I-STAT CG4 LACTIC ACID, ED: Lactic Acid, Venous: 1.6 mmol/L (ref 0.5–1.9)

## 2024-07-25 LAB — PROTIME-INR
INR: 1.1 (ref 0.8–1.2)
Prothrombin Time: 14.7 s (ref 11.4–15.2)

## 2024-07-25 LAB — TROPONIN T, HIGH SENSITIVITY: Troponin T High Sensitivity: 26 ng/L — ABNORMAL HIGH (ref 0–19)

## 2024-07-25 MED ORDER — IPRATROPIUM-ALBUTEROL 0.5-2.5 (3) MG/3ML IN SOLN: 3.0000 mL | Freq: Once | RESPIRATORY_TRACT | Status: DC

## 2024-07-25 MED ORDER — LACTATED RINGERS IV BOLUS (SEPSIS)
250.0000 mL | Freq: Once | INTRAVENOUS | Status: AC
  Administered 2024-07-25: 250 mL via INTRAVENOUS

## 2024-07-25 MED ORDER — IOHEXOL 350 MG/ML SOLN
75.0000 mL | Freq: Once | INTRAVENOUS | Status: AC | PRN
  Administered 2024-07-25: 75 mL via INTRAVENOUS

## 2024-07-25 MED ORDER — OXYCODONE HCL 5 MG PO TABS
5.0000 mg | ORAL_TABLET | ORAL | Status: DC | PRN
  Administered 2024-07-27 – 2024-08-08 (×2): 5 mg via ORAL
  Filled 2024-07-25 (×4): qty 1

## 2024-07-25 MED ORDER — ENOXAPARIN SODIUM 40 MG/0.4ML IJ SOSY
40.0000 mg | PREFILLED_SYRINGE | INTRAMUSCULAR | Status: AC
  Administered 2024-07-25 – 2024-08-17 (×24): 40 mg via SUBCUTANEOUS
  Filled 2024-07-25 (×24): qty 0.4

## 2024-07-25 MED ORDER — BISACODYL 5 MG PO TBEC: 5.0000 mg | DELAYED_RELEASE_TABLET | Freq: Every day | ORAL | Status: AC | PRN

## 2024-07-25 MED ORDER — GUAIFENESIN ER 600 MG PO TB12
600.0000 mg | ORAL_TABLET | Freq: Two times a day (BID) | ORAL | Status: AC
  Administered 2024-07-25 – 2024-08-17 (×48): 600 mg via ORAL
  Filled 2024-07-25 (×48): qty 1

## 2024-07-25 MED ORDER — LACTATED RINGERS IV SOLN: INTRAVENOUS | Status: AC

## 2024-07-25 MED ORDER — IPRATROPIUM-ALBUTEROL 0.5-2.5 (3) MG/3ML IN SOLN
3.0000 mL | Freq: Four times a day (QID) | RESPIRATORY_TRACT | Status: AC | PRN
  Administered 2024-08-03: 3 mL via RESPIRATORY_TRACT
  Filled 2024-07-25 (×2): qty 3

## 2024-07-25 MED ORDER — SENNA 8.6 MG PO TABS
1.0000 | ORAL_TABLET | Freq: Two times a day (BID) | ORAL | Status: AC
  Administered 2024-07-25 – 2024-08-17 (×42): 8.6 mg via ORAL
  Filled 2024-07-25 (×43): qty 1

## 2024-07-25 MED ORDER — ALBUTEROL SULFATE (2.5 MG/3ML) 0.083% IN NEBU: 2.5000 mg | INHALATION_SOLUTION | Freq: Four times a day (QID) | RESPIRATORY_TRACT | Status: DC | PRN

## 2024-07-25 MED ORDER — ACETAMINOPHEN 650 MG RE SUPP: 650.0000 mg | Freq: Four times a day (QID) | RECTAL | Status: AC | PRN

## 2024-07-25 MED ORDER — ACETAMINOPHEN 325 MG PO TABS: 650.0000 mg | ORAL_TABLET | Freq: Four times a day (QID) | ORAL | Status: AC | PRN

## 2024-07-25 MED ORDER — LACTATED RINGERS IV BOLUS (SEPSIS)
1000.0000 mL | Freq: Once | INTRAVENOUS | Status: AC
  Administered 2024-07-25: 1000 mL via INTRAVENOUS

## 2024-07-25 MED ORDER — SODIUM CHLORIDE 0.9 % IV SOLN
2.0000 g | Freq: Once | INTRAVENOUS | Status: AC
  Administered 2024-07-25: 2 g via INTRAVENOUS
  Filled 2024-07-25: qty 20

## 2024-07-25 MED ORDER — AZITHROMYCIN 250 MG PO TABS
500.0000 mg | ORAL_TABLET | Freq: Once | ORAL | Status: AC
  Administered 2024-07-25: 500 mg via ORAL
  Filled 2024-07-25: qty 2

## 2024-07-25 MED ORDER — GUAIFENESIN-DM 100-10 MG/5ML PO SYRP: 5.0000 mL | ORAL_SOLUTION | Freq: Four times a day (QID) | ORAL | Status: AC | PRN

## 2024-07-25 MED ORDER — POLYETHYLENE GLYCOL 3350 17 G PO PACK
17.0000 g | PACK | Freq: Every day | ORAL | Status: AC | PRN
  Administered 2024-08-12: 17 g via ORAL
  Filled 2024-07-25: qty 1

## 2024-07-25 MED ORDER — HYDRALAZINE HCL 20 MG/ML IJ SOLN: 10.0000 mg | Freq: Four times a day (QID) | INTRAMUSCULAR | Status: AC | PRN

## 2024-07-25 NOTE — H&P (Signed)
 " [facility] History and Physical  John [name] FMW:[id number] DOB: [birth year] DOA: 07/25/2024 PCP: Practice, [facility]  Presented from: Home Chief Complaint: Worsening shortness of breath  History of Present Illness: John [name] is a 79 y.o. 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 Sherrod.  Underwent radiation in the past.  Received last dose of immunotherapy with durvalumab  12/16. 12/30, he a chest x-ray done which resulted 10 days later on 1/10 to show bilateral pneumonia.  Patient continued to have symptoms at that time but then suddenly started having shortness of breath, cough and decreased exercise tolerance.SABRA  He does not have any supplemental oxygen  at home. 1/11, he called Dr. Jeannett office and was called in a prescription for doxycycline which he started yesterday 1/13.  1/14, today patient felt significantly short of breath and hence presented to the ED  In the ED, patient is afebrile, heart rate in 100s, blood pressure in 110s, initially very hypoxic at 68% on room air and required 15 L oxygen  by nasal cannula.   VBG with pH 7.47, pCO2 41, O2 sat 99 CBC with WC count 15, lactic acid normal, CMP unremarkable Respiratory virus panel negative  CT angio chest was negative for pulm embolism but  -showed patchy consolidation and ground glass bilaterally suggestive of multifocal pneumonia. -partially loculated moderate left pleural effusion. -Enlarged pulmonic trunk, indicative of pulmonary arterial hypertension.  Blood culture collected Patient was started on IV Rocephin , azithromycin  Hospitalist service consulted for inpatient management  At the time of my evaluation, patient  was lying down on bed. Alert, awake, oriented x 3.  Sister was at bedside. Within 1 to 2 hours, oxygenation improved and he was down to 6 L. History reviewed and detailed as above.  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   worked up with cardiology dr hochrein and f/u prn   Nocturia 02/11/2021   Prostate CA (HCC) 2024   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   Wears glasses 02/11/2021    Past surgical history: Past Surgical History:  Procedure Laterality Date   ABDOMINAL HERNIA REPAIR  1990's   BRONCHIAL BIOPSY  08/08/2023   Procedure: BRONCHIAL BIOPSIES;  Surgeon: Shelah Lamar RAMAN, MD;  Location: [facility] ENDOSCOPY;  Service: Pulmonary;;   BRONCHIAL BRUSHINGS  08/08/2023   Procedure: BRONCHIAL BRUSHINGS;  Surgeon: Shelah Lamar RAMAN, MD;  Location: [facility] ENDOSCOPY;  Service: Pulmonary;;   BRONCHIAL NEEDLE ASPIRATION  BIOPSY  08/08/2023   Procedure: BRONCHIAL NEEDLE ASPIRATION BIOPSIES;  Surgeon: Shelah Lamar RAMAN, MD;  Location: [facility] ENDOSCOPY;  Service: Pulmonary;;   CYSTOSCOPY W/ RETROGRADES N/A 11/03/2015   Procedure: CYSTOSCOPY WITH RETROGRADE PYELOGRAM;  Surgeon: Belvie LITTIE Clara, MD;  Location: [facility];  Service: Urology;  Laterality: N/A;    CYSTOSCOPY W/ RETROGRADES Left 01/09/2016   Procedure: CYSTOSCOPY WITH LEFT RETROGRADE PYELOGRAM;  Surgeon: Belvie LITTIE Clara, MD;  Location: [facility];  Service: Urology;  Laterality: Left;   CYSTOSCOPY W/ RETROGRADES Bilateral 05/28/2016   Procedure: CYSTOSCOPY WITH RETROGRADE PYELOGRAM;  Surgeon: Belvie LITTIE Clara, MD;  Location: [facility];  Service: Urology;  Laterality: Bilateral;   CYSTOSCOPY W/ RETROGRADES Bilateral 02/17/2021   Procedure: CYSTOSCOPY WITH RETROGRADE PYELOGRAM;  Surgeon: Rosalind Zachary NOVAK, MD;  Location: [facility];  Service: Urology;  Laterality: Bilateral;   CYSTOSCOPY W/ URETERAL STENT REMOVAL Left 01/09/2016   Procedure: CYSTOSCOPY WITH LEFT STENT EXCHANGE;  Surgeon: Belvie LITTIE Clara, MD;  Location: [facility];  Service: Urology;  Laterality: Left;   CYSTOSCOPY WITH BIOPSY N/A 05/28/2016   Procedure: CYSTOSCOPY WITH BIOPSY;  Surgeon: Belvie LITTIE Clara, MD;  Location: [facility];  Service: Urology;  Laterality: N/A;   CYSTOSCOPY WITH URETHRAL DILATATION N/A 05/28/2016   Procedure: CYSTOSCOPY WITH URETHRAL DILATATION;  Surgeon: Belvie LITTIE Clara, MD;  Location: [facility];  Service: Urology;  Laterality: N/A;   HEMOSTASIS CONTROL  08/08/2023   Procedure: HEMOSTASIS CONTROL;  Surgeon: Shelah Lamar RAMAN, MD;  Location: [facility] ENDOSCOPY;  Service: Pulmonary;;   IR IMAGING GUIDED PORT INSERTION  09/19/2023   PROSTATE BIOPSY  05/08/12   Adenocarcinoma   RADIOACTIVE SEED IMPLANT  08/01/2012   Procedure: RADIOACTIVE SEED IMPLANT;  Surgeon: Thomasine Oiler, MD;  Location: [facility];  Service: Urology;  Laterality: N/A;  73 seeds implanted no seeds found in bladder   TRANSURETHRAL RESECTION OF BLADDER TUMOR N/A 11/03/2015   Procedure: TRANSURETHRAL RESECTION OF BLADDER TUMOR (TURBT)WITH INSERTION STENT;  Surgeon: Belvie LITTIE Clara, MD;  Location: [facility];  Service: Urology;  Laterality: N/A;   TRANSURETHRAL RESECTION OF BLADDER TUMOR N/A 02/17/2021   Procedure: TRANSURETHRAL RESECTION OF BLADDER TUMOR (TURBT);  Surgeon: Rosalind Zachary NOVAK, MD;  Location: [facility];  Service: Urology;  Laterality: N/A;  30 MINS   TRANSURETHRAL RESECTION OF BLADDER TUMOR WITH GYRUS (TURBT-GYRUS) N/A 01/09/2016   Procedure: TRANSURETHRAL RESECTION OF BLADDER TUMOR WITH GYRUS (TURBT-GYRUS);  Surgeon: Belvie LITTIE Clara, MD;  Location: [facility];  Service: Urology;  Laterality: N/A;   VIDEO BRONCHOSCOPY WITH RADIAL ENDOBRONCHIAL ULTRASOUND  08/08/2023   Procedure: VIDEO BRONCHOSCOPY WITH RADIAL ENDOBRONCHIAL ULTRASOUND;  Surgeon: Shelah Lamar RAMAN, MD;  Location: [facility] ENDOSCOPY;  Service: Pulmonary;;    Social History:  reports that he quit smoking about 9 years ago. His smoking use included cigarettes. He started smoking about 51 years ago. He has a 21 pack-year smoking history. He has never used smokeless tobacco. He reports current alcohol use. He reports current drug use. Drug: Marijuana.  Allergies:  Allergies[1] Patient has no known allergies.   Family history:  Family History  Problem Relation Age of Onset   Hyperlipidemia Mother    Asthma Mother    Heart failure Mother    Aneurysm Father    Hyperlipidemia Sister    Asthma Sister      Physical Exam: Vitals:   07/25/24 1240  07/25/24 1244 07/25/24 1245 07/25/24 1332  BP:   (!) 117/56   Pulse:   (!) 110 98  Resp:    (!) 26  Temp:   98.1 F (36.7 C)   TempSrc:   Oral   SpO2: (!) 68% (!) 84% 100% 94%   Wt Readings from Last 3 Encounters:  06/26/24 68 kg  05/21/24 68.5 kg  04/23/24 67 kg   There is no height or weight on file to calculate BMI.  General exam: Pleasant, elderly African-American male Skin: No rashes, lesions or ulcers. HEENT: Atraumatic, normocephalic, no obvious bleeding Lungs: Diminished air entry in both bases.  Otherwise clear to  auscultation.  No crackles or wheezing  CVS: S1, S2, no murmur,   GI/Abd: Soft, nontender, nondistended, bowel sound present,   CNS: Alert, awake, oriented x 3 Psychiatry: Mood appropriate Extremities: No pedal edema, no calf tenderness,    ----------------------------------------------------------------------------------------------------------------------------------------- ----------------------------------------------------------------------------------------------------------------------------------------- -----------------------------------------------------------------------------------------------------------------------------------------  Assessment/Plan: Multifocal pneumonia Presented with progressively worsening shortness of breath.  Did not respond to outpatient antibiotics No fever, WBC count elevated to 15, lactic acid normal CT scan with multifocal pneumonia Blood culture sent.  Obtain procalcitonin level Has been started on IV Rocephin , azithromycin  (EKG with QTc 427 ms) Also start supportive care with Mucinex  twice daily and as needed Pulmicort, DuoNeb, incentive spirometry, flutter valve. Recent Labs  Lab 07/25/24 1314 07/25/24 1328  WBC 15.0*  --   LATICACIDVEN  --  1.6   Acute respiratory failure with hypoxia Initially hypoxic to 60 on room air and required oxygen  via 100% nrb.  Within 1 to 2 hours, oxygenation has improved and he is on 6 L oxygen .. Blood gas did not show any CO2 retention. Within 1 to 2 hours, oxygenation has improved and he was down to 6 L. Continue to wean down.  Check ambulatory oxygen  requirement  Hypertension H/o moderate aortic regurgitation CT angio showed enlarged pulmonary trunk indicating pulmonary artery hypertension Clinically does not look volume overloaded. Last echo from 2018 had shown moderate AI.  Repeat echo At home on amlodipine.  Continue to monitor blood pressure. 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 Sherrod.  Underwent radiation in the past.  Received last dose of immunotherapy with durvalumab  12/16.   H/o prostate cancer s/p brachytherapy H/o bladder cancer  Home med list has not been obtained/updated by [facility] at this time.  Please double check admission reconciliation.    Mobility: Lives at home, ambulates with a cane.  Encourage ambulation  Goals of care:   Code Status: Full Code    DVT prophylaxis:  enoxaparin  (LOVENOX ) injection 40 mg Start: 07/25/24 1800   Antimicrobials: IV Rocephin , azithromycin  Fluid: None Consultants: None Family Communication: Sister at bedside  Status: Inpatient  Level of care:  Progressive   Patient is from: Home Anticipated d/c to: Pending clinical course  Diet:  Diet Order             Diet regular Room service appropriate? Yes; Fluid consistency: Thin  Diet effective now                    ------------------------------------------------------------------------------------- Severity  of Illness: The appropriate patient status for this patient is INPATIENT. Inpatient status is judged to be reasonable and necessary in order to provide the required intensity of service to ensure the patient's safety. The patient's presenting symptoms, physical exam findings, and initial radiographic and laboratory data in the context of their chronic comorbidities is felt to place them at high risk for further clinical deterioration. Furthermore, it is not anticipated that the patient will be medically stable for discharge from the hospital within 2 midnights of admission.   * I certify that at the point of admission it is my clinical judgment that the  patient will require inpatient hospital care spanning beyond 2 midnights from the point of admission due to high intensity of service, high risk for further deterioration and high frequency of surveillance required.* -------------------------------------------------------------------------------------   Home Meds: Prior to Admission medications  Medication Sig Start Date End Date Taking? Authorizing Provider  albuterol  (VENTOLIN  HFA) 108 (90 Base) MCG/ACT inhaler Inhale 1-2 puffs into the lungs every 6 (six) hours as needed for wheezing or shortness of breath. 05/12/24   Yolande Lamar BROCKS, MD  amLODipine (NORVASC) 10 MG tablet Take 10 mg by mouth daily.    [provider]  APIXABAN  (ELIQUIS ) VTE STARTER PACK (10MG  AND 5MG ) Take as directed on package: start with two-5mg  tablets twice daily for 7 days. On day 8, switch to one-5mg  tablet twice daily. 05/12/24   Yolande Lamar BROCKS, MD  lidocaine -prilocaine  (EMLA ) cream Apply 1 Application topically as needed. 08/25/23   Heilingoetter, Cassandra L, PA-C  memantine  (NAMENDA ) 10 MG tablet Take 1 tablet (10 mg total) by mouth 2 (two) times daily. 12/07/23   Lanell Donald Stagger, PA-C  memantine  (NAMENDA ) 5 MG tablet Begin this prescription the first day of brain radiation. Week 1: take one tablet po qam. Week 2: take one tablet qam and qpm. Week 3: take two tablets qam, and one tablet po q pm. Week 4: take two tablets qam and qpm. Fill subsequent prescription q month. 12/07/23   Lanell Donald Stagger, PA-C  prochlorperazine  (COMPAZINE ) 10 MG tablet Take 1 tablet (10 mg total) by mouth every 6 (six) hours as needed. 08/25/23   Heilingoetter, Cassandra L, PA-C  psyllium (METAMUCIL SMOOTH TEXTURE) 58.6 % powder Take 1 packet by mouth 3 (three) times daily. 05/29/24   Kingsley, Victoria K, DO  senna-docusate (SENOKOT-S) 8.6-50 MG tablet Take 1 tablet by mouth daily. 05/29/24   Ellouise Richerd POUR, DO    Labs on Admission:   CBC: Recent Labs  Lab  07/25/24 1314 07/25/24 1328  WBC 15.0*  --   NEUTROABS 13.3*  --   HGB 13.7 13.9  HCT 42.3 41.0  MCV 96.6  --   PLT 240  --     Basic Metabolic Panel: Recent Labs  Lab 07/25/24 1314 07/25/24 1328  NA 139 139  K 4.1 4.1  CL 104 103  CO2 29  --   GLUCOSE 197* 195*  BUN 17 16  CREATININE 0.92 1.00  CALCIUM 10.3  --     Liver Function Tests: Recent Labs  Lab 07/25/24 1314  AST 27  ALT 11  ALKPHOS 123  BILITOT 0.5  PROT 7.3  ALBUMIN 3.4*   No results for input(s): LIPASE, AMYLASE in the last 168 hours. No results for input(s): AMMONIA in the last 168 hours.  Cardiac Enzymes: No results for input(s): CKTOTAL, CKMB, CKMBINDEX, TROPONINI in the last 168 hours.  BNP (last 3 results) No results for  input(s): BNP in the last 8760 hours.  ProBNP (last 3 results) Recent Labs    05/12/24 1726 05/29/24 1826  PROBNP 113.0 113.0    CBG: No results for input(s): GLUCAP in the last 168 hours.  Lipase     Component Value Date/Time   LIPASE 39 05/29/2024 1450     Urinalysis    Component Value Date/Time   COLORURINE YELLOW 05/29/2024 1450   APPEARANCEUR HAZY (A) 05/29/2024 1450   LABSPEC 1.020 05/29/2024 1450   PHURINE 5.0 05/29/2024 1450   GLUCOSEU NEGATIVE 05/29/2024 1450   HGBUR NEGATIVE 05/29/2024 1450   BILIRUBINUR NEGATIVE 05/29/2024 1450   KETONESUR NEGATIVE 05/29/2024 1450   PROTEINUR NEGATIVE 05/29/2024 1450   NITRITE NEGATIVE 05/29/2024 1450   LEUKOCYTESUR NEGATIVE 05/29/2024 1450     Drugs of Abuse  No results found for: LABOPIA, COCAINSCRNUR, LABBENZ, AMPHETMU, THCU, LABBARB    Radiological Exams on Admission: CT Angio Chest Pulmonary Embolism (PE) W or WO Contrast Result Date: 07/25/2024 CLINICAL DATA:  Lung cancer recent pneumonia, decreased O2 sats weakness. Shortness of breath and abdominal pain. * Tracking Code: BO * EXAM: CT ANGIOGRAPHY CHEST CT ABDOMEN AND PELVIS WITH CONTRAST TECHNIQUE: Multidetector CT  imaging of the chest was performed using the standard protocol during bolus administration of intravenous contrast. Multiplanar CT image reconstructions and MIPs were obtained to evaluate the vascular anatomy. Multidetector CT imaging of the abdomen and pelvis was performed using the standard protocol during bolus administration of intravenous contrast. RADIATION DOSE REDUCTION: This exam was performed according to the departmental dose-optimization program which includes automated exposure control, adjustment of the mA and/or kV according to patient size and/or use of iterative reconstruction technique. CONTRAST:  75mL OMNIPAQUE  IOHEXOL  350 MG/ML SOLN COMPARISON:  CT abdomen pelvis 05/29/2024 PET 03/05/2024 and CT chest 07/25/2024. FINDINGS: CTA CHEST FINDINGS Cardiovascular: Negative for pulmonary embolus. Right IJ Port-A-Cath terminates in the low SVC or SVC RA junction. Atherosclerotic calcification of the aorta and coronary arteries. Enlarged pulmonic trunk and heart. Left ventricle is dilated. No pericardial effusion. Mediastinum/Nodes: Mediastinal lymph nodes measure up to 13 mm in the low right paratracheal station, previously 6 mm. Soft tissue thickening in the left hilum. Right hilar lymph nodes measure up to 10 mm. Air in the esophagus can be seen with dysmotility. Lungs/Pleura: Centrilobular and paraseptal emphysema. Patchy consolidation and ground-glass bilaterally, new from 03/05/2024. Post radiation architectural distortion in the upper and lower lobes. Moderate partially loculated left pleural effusion. Airway is unremarkable. Musculoskeletal: Degenerative changes in the spine. No worrisome lytic or sclerotic lesions. Review of the MIP images confirms the above findings. CT ABDOMEN and PELVIS FINDINGS Hepatobiliary: Tiny low-attenuation lesion in the left hepatic lobe, too small to characterize. Liver and gallbladder are otherwise unremarkable. No biliary ductal dilatation. Pancreas: Negative.  Spleen: Negative. Adrenals/Urinary Tract: Adrenal glands are unremarkable. Low-attenuation lesions in the kidneys. No specific follow-up necessary. Ureters are decompressed. Bladder is grossly unremarkable. Stomach/Bowel: Stomach, small bowel, appendix and colon are unremarkable. Vascular/Lymphatic: Atherosclerotic calcification of the aorta. No pathologically enlarged lymph nodes. 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 Other: No free fluid. Musculoskeletal: Degenerative changes in the spine. Dextroconvex scoliosis. No worrisome lytic or sclerotic lesions. Review of the MIP images confirms the above findings. IMPRESSION: 1. Negative for pulmonary embolus. 2. Multilobar pneumonia. 3. 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 Evaluation for recurrent disease is challenging due to surrounding acute ground-glass and consolidation. 4. Interval enlargement of mediastinal lymph nodes may be reactive in etiology. Recommend  attention on follow-up. 5. Partially loculated moderate left pleural effusion. 6. Aortic atherosclerosis (ICD10-I70.0). Coronary artery calcification. 7. Enlarged pulmonic trunk, indicative of pulmonary arterial hypertension. Electronically Signed   By: Newell Eke M.D.   On: 07/25/2024 14:29   CT ABDOMEN PELVIS WO CONTRAST Result Date: 07/25/2024 CLINICAL DATA:  Lung cancer recent pneumonia, decreased O2 sats weakness. Shortness of breath and abdominal pain. * Tracking Code: BO * EXAM: CT ANGIOGRAPHY CHEST CT ABDOMEN AND PELVIS WITH CONTRAST TECHNIQUE: Multidetector CT imaging of the chest was performed using the standard protocol during bolus administration of intravenous contrast. Multiplanar CT image reconstructions and MIPs were obtained to evaluate the vascular anatomy. Multidetector CT imaging of the abdomen and pelvis was performed using the standard protocol during bolus administration of intravenous contrast. RADIATION DOSE REDUCTION: This exam was  performed according to the departmental dose-optimization program which includes automated exposure control, adjustment of the mA and/or kV according to patient size and/or use of iterative reconstruction technique. CONTRAST:  75mL OMNIPAQUE  IOHEXOL  350 MG/ML SOLN COMPARISON:  CT abdomen pelvis 05/29/2024 PET 03/05/2024 and CT chest 07/25/2024. FINDINGS: CTA CHEST FINDINGS Cardiovascular: Negative for pulmonary embolus. Right IJ Port-A-Cath terminates in the low SVC or SVC RA junction. Atherosclerotic calcification of the aorta and coronary arteries. Enlarged pulmonic trunk and heart. Left ventricle is dilated. No pericardial effusion. Mediastinum/Nodes: Mediastinal lymph nodes measure up to 13 mm in the low right paratracheal station, previously 6 mm. Soft tissue thickening in the left hilum. Right hilar lymph nodes measure up to 10 mm. Air in the esophagus can be seen with dysmotility. Lungs/Pleura: Centrilobular and paraseptal emphysema. Patchy consolidation and ground-glass bilaterally, new from 03/05/2024. Post radiation architectural distortion in the upper and lower lobes. Moderate partially loculated left pleural effusion. Airway is unremarkable. Musculoskeletal: Degenerative changes in the spine. No worrisome lytic or sclerotic lesions. Review of the MIP images confirms the above findings. CT ABDOMEN and PELVIS FINDINGS Hepatobiliary: Tiny low-attenuation lesion in the left hepatic lobe, too small to characterize. Liver and gallbladder are otherwise unremarkable. No biliary ductal dilatation. Pancreas: Negative. Spleen: Negative. Adrenals/Urinary Tract: Adrenal glands are unremarkable. Low-attenuation lesions in the kidneys. No specific follow-up necessary. Ureters are decompressed. Bladder is grossly unremarkable. Stomach/Bowel: Stomach, small bowel, appendix and colon are unremarkable. Vascular/Lymphatic: Atherosclerotic calcification of the aorta. No pathologically enlarged lymph nodes. Reproductive:  Brachytherapy seeds in the prostate. Other: No free fluid. Musculoskeletal: Degenerative changes in the spine. Dextroconvex scoliosis. No worrisome lytic or sclerotic lesions. Review of the MIP images confirms the above findings. IMPRESSION: 1. Negative for pulmonary embolus. 2. Multilobar pneumonia. 3. 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 Evaluation for recurrent disease is challenging due to surrounding acute ground-glass and consolidation. 4. Interval enlargement of mediastinal lymph nodes may be reactive in etiology. Recommend attention on follow-up. 5. Partially loculated moderate left pleural effusion. 6. Aortic atherosclerosis (ICD10-I70.0). Coronary artery calcification. 7. Enlarged pulmonic trunk, indicative of pulmonary arterial hypertension. Electronically Signed   By: Newell Eke M.D.   On: 07/25/2024 14:29   DG Chest Port 1 View Result Date: 07/25/2024 EXAM: 1 VIEW(S) XRAY OF THE CHEST 07/25/2024 01:55:00 AM COMPARISON: 07/10/2024 CLINICAL HISTORY: Questionable sepsis; evaluate for abnormality. FINDINGS: LINES, TUBES AND DEVICES: Right chest port with tip in the right atrium. LUNGS AND PLEURA: New airspace opacity in Right Lower Lung Zone. Hazy opacity throughout Left Lung. Bandlike scarring in Right Mid, unchanged. Elevated Left Hemidiaphragm. Small Left Pleural Effusion. Hazy airspace opacities have worsened throughout the Left  Lung. Patchy airspace opacities in the Right Lung Base, somewhat more nodular, worsened in the interim. No pneumothorax. HEART AND MEDIASTINUM: The tip of the right chest port is in the right atrium. The cardiac and mediastinal silhouettes are otherwise without acute abnormality. BONES AND SOFT TISSUES: No acute osseous abnormality. Multilevel thoracic osteophytosis. IMPRESSION: 1. Worsening bilateral airspace opacities, including worsening patchy right basilar opacities and worsening hazy opacities throughout the left lung, concerning for  worsening multifocal pneumonia. 2. Small left pleural effusion. Electronically signed by: Rogelia Myers MD 07/25/2024 02:06 PM EST RP Workstation: CARREN Bonney Chapman Arlice, MD [facility] 07/25/2024        [1] No Known Allergies  "

## 2024-07-25 NOTE — Progress Notes (Addendum)
 DO requesting PT be placed on high flow device (Salter)- Sp02 goal >=94%. PT placed on 7 LPM Salter. ABG on PRN has been sent to Lab. RN aware of Salter placement and that it can run between 6-15 lpm (can go to standard nasal cannula if requirement is less than 6 LPM).

## 2024-07-25 NOTE — Sepsis Progress Note (Signed)
Elink will monitor per sepsis protocol

## 2024-07-25 NOTE — ED Notes (Signed)
 Pt arrived to room on 100% NRB due to CA of the lung and increased SHOB. Pt is able to speak in short sentences. Resp called. Port accessed, followed by 2 sets of blood from 2 sites and labs obtained, sent to lab.

## 2024-07-25 NOTE — ED Provider Notes (Signed)
 " [place] [facility] AT [facility] Provider Note   CSN: [id number] Arrival date & time: 07/25/24  1231     Patient presents with: Weakness   John [name] is a 79 y.o. male.   79 year old male with history of lung cancer presents for evaluation of shortness of breath and weakness.  States he felt so weak today he was unable to stand.  He has been short of breath for 3 days.  Was diagnosed with pneumonia 2 days ago and started on antibiotics.  He is not on oxygen  at home.  Was found to have room air saturation of 60% on arrival.  He denies any other symptoms or concerns at this time.  Was placed on nonrebreather in triage.   Weakness Associated symptoms: cough and shortness of breath   Associated symptoms: no abdominal pain, no arthralgias, no chest pain, no dysuria, no fever, no seizures and no vomiting        Prior to Admission medications  Medication Sig Start Date End Date Taking? Authorizing Provider  albuterol  (VENTOLIN  HFA) 108 (90 Base) MCG/ACT inhaler Inhale 1-2 puffs into the lungs every 6 (six) hours as needed for wheezing or shortness of breath. 05/12/24   Yolande Lamar BROCKS, MD  amLODipine (NORVASC) 10 MG tablet Take 10 mg by mouth daily.    [provider]  APIXABAN  (ELIQUIS ) VTE STARTER PACK (10MG  AND 5MG ) Take as directed on package: start with two-5mg  tablets twice daily for 7 days. On day 8, switch to one-5mg  tablet twice daily. 05/12/24   Yolande Lamar BROCKS, MD  lidocaine -prilocaine  (EMLA ) cream Apply 1 Application topically as needed. 08/25/23   Heilingoetter, Cassandra L, PA-C  memantine  (NAMENDA ) 10 MG tablet Take 1 tablet (10 mg total) by mouth 2 (two) times daily. 12/07/23   Lanell Donald Stagger, PA-C  memantine  (NAMENDA ) 5 MG tablet Begin this prescription the first day of brain radiation. Week 1: take one tablet po qam. Week 2: take one tablet qam and qpm. Week 3: take two tablets qam, and one tablet po q pm. Week 4: take two  tablets qam and qpm. Fill subsequent prescription q month. 12/07/23   Lanell Donald Stagger, PA-C  prochlorperazine  (COMPAZINE ) 10 MG tablet Take 1 tablet (10 mg total) by mouth every 6 (six) hours as needed. 08/25/23   Heilingoetter, Cassandra L, PA-C  psyllium (METAMUCIL SMOOTH TEXTURE) 58.6 % powder Take 1 packet by mouth 3 (three) times daily. 05/29/24   Kingsley, Victoria K, DO  senna-docusate (SENOKOT-S) 8.6-50 MG tablet Take 1 tablet by mouth daily. 05/29/24   Kingsley, Victoria K, DO    Allergies: Patient has no known allergies.    Review of Systems  Constitutional:  Positive for fatigue. Negative for chills and fever.  HENT:  Negative for ear pain and sore throat.   Eyes:  Negative for pain and visual disturbance.  Respiratory:  Positive for cough and shortness of breath.   Cardiovascular:  Negative for chest pain and palpitations.  Gastrointestinal:  Negative for abdominal pain and vomiting.  Genitourinary:  Negative for dysuria and hematuria.  Musculoskeletal:  Negative for arthralgias and back pain.  Skin:  Negative for color change and rash.  Neurological:  Positive for weakness. Negative for seizures and syncope.  All other systems reviewed and are negative.   Updated Vital Signs BP (!) 117/56 (BP Location: Right Arm)   Pulse 98   Temp 98.1 F (36.7 C) (Oral)   Resp (!) 26   SpO2  94%   Physical Exam Vitals and nursing note reviewed.  Constitutional:      General: He is in acute distress.     Appearance: He is well-developed. He is ill-appearing and diaphoretic.  HENT:     Head: Normocephalic and atraumatic.  Eyes:     Conjunctiva/sclera: Conjunctivae normal.  Cardiovascular:     Rate and Rhythm: Regular rhythm. Tachycardia present.     Heart sounds: No murmur heard. Pulmonary:     Effort: No respiratory distress.     Comments: Diminished breath sounds throughout, tachypneic, +respiratory distress Abdominal:     Palpations: Abdomen is soft.     Tenderness:  There is no abdominal tenderness.  Musculoskeletal:        General: No swelling.     Cervical back: Neck supple.  Skin:    General: Skin is warm.     Capillary Refill: Capillary refill takes less than 2 seconds.  Neurological:     General: No focal deficit present.     Mental Status: He is alert.  Psychiatric:        Mood and Affect: Mood normal.     (all labs ordered are listed, but only abnormal results are displayed) Labs Reviewed  COMPREHENSIVE METABOLIC PANEL WITH GFR - Abnormal; Notable for the following components:      Result Value   Glucose, Bld 197 (*)    Albumin 3.4 (*)    All other components within normal limits  CBC WITH DIFFERENTIAL/PLATELET - Abnormal; Notable for the following components:   WBC 15.0 (*)    Neutro Abs 13.3 (*)    Lymphs Abs 0.4 (*)    Monocytes Absolute 1.1 (*)    Abs Immature Granulocytes 0.09 (*)    All other components within normal limits  BLOOD GAS, ARTERIAL - Abnormal; Notable for the following components:   pH, Arterial 7.47 (*)    pO2, Arterial 121 (*)    Bicarbonate 29.8 (*)    Acid-Base Excess 5.6 (*)    All other components within normal limits  I-STAT CHEM 8, ED - Abnormal; Notable for the following components:   Glucose, Bld 195 (*)    All other components within normal limits  RESP PANEL BY RT-PCR (RSV, FLU A&B, COVID)  RVPGX2  CULTURE, BLOOD (ROUTINE X 2)  CULTURE, BLOOD (ROUTINE X 2)  EXPECTORATED SPUTUM ASSESSMENT W GRAM STAIN, RFLX TO RESP C  PROTIME-INR  URINALYSIS, W/ REFLEX TO CULTURE (INFECTION SUSPECTED)  PRO BRAIN NATRIURETIC PEPTIDE  PROCALCITONIN  LEGIONELLA PNEUMOPHILA SEROGP 1 UR AG  STREP PNEUMONIAE URINARY ANTIGEN  I-STAT CG4 LACTIC ACID, ED  I-STAT CG4 LACTIC ACID, ED  TROPONIN T, HIGH SENSITIVITY  TROPONIN T, HIGH SENSITIVITY    EKG: None  Radiology: CT Angio Chest Pulmonary Embolism (PE) W or WO Contrast Result Date: 07/25/2024 CLINICAL DATA:  Lung cancer recent pneumonia, decreased O2 sats  weakness. Shortness of breath and abdominal pain. * Tracking Code: BO * EXAM: CT ANGIOGRAPHY CHEST CT ABDOMEN AND PELVIS WITH CONTRAST TECHNIQUE: Multidetector CT imaging of the chest was performed using the standard protocol during bolus administration of intravenous contrast. Multiplanar CT image reconstructions and MIPs were obtained to evaluate the vascular anatomy. Multidetector CT imaging of the abdomen and pelvis was performed using the standard protocol during bolus administration of intravenous contrast. RADIATION DOSE REDUCTION: This exam was performed according to the departmental dose-optimization program which includes automated exposure control, adjustment of the mA and/or kV according to patient size and/or use of  iterative reconstruction technique. CONTRAST:  75mL OMNIPAQUE  IOHEXOL  350 MG/ML SOLN COMPARISON:  CT abdomen pelvis 05/29/2024 PET 03/05/2024 and CT chest 07/25/2024. FINDINGS: CTA CHEST FINDINGS Cardiovascular: Negative for pulmonary embolus. Right IJ Port-A-Cath terminates in the low SVC or SVC RA junction. Atherosclerotic calcification of the aorta and coronary arteries. Enlarged pulmonic trunk and heart. Left ventricle is dilated. No pericardial effusion. Mediastinum/Nodes: Mediastinal lymph nodes measure up to 13 mm in the low right paratracheal station, previously 6 mm. Soft tissue thickening in the left hilum. Right hilar lymph nodes measure up to 10 mm. Air in the esophagus can be seen with dysmotility. Lungs/Pleura: Centrilobular and paraseptal emphysema. Patchy consolidation and ground-glass bilaterally, new from 03/05/2024. Post radiation architectural distortion in the upper and lower lobes. Moderate partially loculated left pleural effusion. Airway is unremarkable. Musculoskeletal: Degenerative changes in the spine. No worrisome lytic or sclerotic lesions. Review of the MIP images confirms the above findings. CT ABDOMEN and PELVIS FINDINGS Hepatobiliary: Tiny low-attenuation  lesion in the left hepatic lobe, too small to characterize. Liver and gallbladder are otherwise unremarkable. No biliary ductal dilatation. Pancreas: Negative. Spleen: Negative. Adrenals/Urinary Tract: Adrenal glands are unremarkable. Low-attenuation lesions in the kidneys. No specific follow-up necessary. Ureters are decompressed. Bladder is grossly unremarkable. Stomach/Bowel: Stomach, small bowel, appendix and colon are unremarkable. Vascular/Lymphatic: Atherosclerotic calcification of the aorta. No pathologically enlarged lymph nodes. 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 Other: No free fluid. Musculoskeletal: Degenerative changes in the spine. Dextroconvex scoliosis. No worrisome lytic or sclerotic lesions. Review of the MIP images confirms the above findings. IMPRESSION: 1. Negative for pulmonary embolus. 2. Multilobar pneumonia. 3. 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 Evaluation for recurrent disease is challenging due to surrounding acute ground-glass and consolidation. 4. Interval enlargement of mediastinal lymph nodes may be reactive in etiology. Recommend attention on follow-up. 5. Partially loculated moderate left pleural effusion. 6. Aortic atherosclerosis (ICD10-I70.0). Coronary artery calcification. 7. Enlarged pulmonic trunk, indicative of pulmonary arterial hypertension. Electronically Signed   By: Newell Eke M.D.   On: 07/25/2024 14:29   CT ABDOMEN PELVIS WO CONTRAST Result Date: 07/25/2024 CLINICAL DATA:  Lung cancer recent pneumonia, decreased O2 sats weakness. Shortness of breath and abdominal pain. * Tracking Code: BO * EXAM: CT ANGIOGRAPHY CHEST CT ABDOMEN AND PELVIS WITH CONTRAST TECHNIQUE: Multidetector CT imaging of the chest was performed using the standard protocol during bolus administration of intravenous contrast. Multiplanar CT image reconstructions and MIPs were obtained to evaluate the vascular anatomy. Multidetector CT imaging of the  abdomen and pelvis was performed using the standard protocol during bolus administration of intravenous contrast. RADIATION DOSE REDUCTION: This exam was performed according to the departmental dose-optimization program which includes automated exposure control, adjustment of the mA and/or kV according to patient size and/or use of iterative reconstruction technique. CONTRAST:  75mL OMNIPAQUE  IOHEXOL  350 MG/ML SOLN COMPARISON:  CT abdomen pelvis 05/29/2024 PET 03/05/2024 and CT chest 07/25/2024. FINDINGS: CTA CHEST FINDINGS Cardiovascular: Negative for pulmonary embolus. Right IJ Port-A-Cath terminates in the low SVC or SVC RA junction. Atherosclerotic calcification of the aorta and coronary arteries. Enlarged pulmonic trunk and heart. Left ventricle is dilated. No pericardial effusion. Mediastinum/Nodes: Mediastinal lymph nodes measure up to 13 mm in the low right paratracheal station, previously 6 mm. Soft tissue thickening in the left hilum. Right hilar lymph nodes measure up to 10 mm. Air in the esophagus can be seen with dysmotility. Lungs/Pleura: Centrilobular and paraseptal emphysema. Patchy consolidation and ground-glass bilaterally, new from 03/05/2024.  Post radiation architectural distortion in the upper and lower lobes. Moderate partially loculated left pleural effusion. Airway is unremarkable. Musculoskeletal: Degenerative changes in the spine. No worrisome lytic or sclerotic lesions. Review of the MIP images confirms the above findings. CT ABDOMEN and PELVIS FINDINGS Hepatobiliary: Tiny low-attenuation lesion in the left hepatic lobe, too small to characterize. Liver and gallbladder are otherwise unremarkable. No biliary ductal dilatation. Pancreas: Negative. Spleen: Negative. Adrenals/Urinary Tract: Adrenal glands are unremarkable. Low-attenuation lesions in the kidneys. No specific follow-up necessary. Ureters are decompressed. Bladder is grossly unremarkable. Stomach/Bowel: Stomach, small bowel,  appendix and colon are unremarkable. Vascular/Lymphatic: Atherosclerotic calcification of the aorta. No pathologically enlarged lymph nodes. 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 Other: No free fluid. Musculoskeletal: Degenerative changes in the spine. Dextroconvex scoliosis. No worrisome lytic or sclerotic lesions. Review of the MIP images confirms the above findings. IMPRESSION: 1. Negative for pulmonary embolus. 2. Multilobar pneumonia. 3. 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 Evaluation for recurrent disease is challenging due to surrounding acute ground-glass and consolidation. 4. Interval enlargement of mediastinal lymph nodes may be reactive in etiology. Recommend attention on follow-up. 5. Partially loculated moderate left pleural effusion. 6. Aortic atherosclerosis (ICD10-I70.0). Coronary artery calcification. 7. Enlarged pulmonic trunk, indicative of pulmonary arterial hypertension. Electronically Signed   By: Newell Eke M.D.   On: 07/25/2024 14:29   DG Chest Port 1 View Result Date: 07/25/2024 EXAM: 1 VIEW(S) XRAY OF THE CHEST 07/25/2024 01:55:00 AM COMPARISON: 07/10/2024 CLINICAL HISTORY: Questionable sepsis; evaluate for abnormality. FINDINGS: LINES, TUBES AND DEVICES: Right chest port with tip in the right atrium. LUNGS AND PLEURA: New airspace opacity in Right Lower Lung Zone. Hazy opacity throughout Left Lung. Bandlike scarring in Right Mid, unchanged. Elevated Left Hemidiaphragm. Small Left Pleural Effusion. Hazy airspace opacities have worsened throughout the Left Lung. Patchy airspace opacities in the Right Lung Base, somewhat more nodular, worsened in the interim. No pneumothorax. HEART AND MEDIASTINUM: The tip of the right chest port is in the right atrium. The cardiac and mediastinal silhouettes are otherwise without acute abnormality. BONES AND SOFT TISSUES: No acute osseous abnormality. Multilevel thoracic osteophytosis. IMPRESSION: 1. Worsening  bilateral airspace opacities, including worsening patchy right basilar opacities and worsening hazy opacities throughout the left lung, concerning for worsening multifocal pneumonia. 2. Small left pleural effusion. Electronically signed by: Rogelia Myers MD 07/25/2024 02:06 PM EST RP Workstation: [id number]     .Critical Care  Performed by: Gennaro Duwaine CROME, DO Authorized by: Gennaro Duwaine CROME, DO   Critical care provider statement:    Critical care time (minutes):  50   Critical care time was exclusive of:  Separately billable procedures and treating other patients and teaching time   Critical care was necessary to treat or prevent imminent or life-threatening deterioration of the following conditions:  Respiratory failure and sepsis   Critical care was time spent personally by me on the following activities:  Development of treatment plan with patient or surrogate, discussions with consultants, evaluation of patient's response to treatment, examination of patient, obtaining history from patient or surrogate, review of old charts, re-evaluation of patient's condition, pulse oximetry, ordering and review of radiographic studies, ordering and performing treatments and interventions and ordering and review of laboratory studies   Care discussed with: admitting provider      Medications Ordered in the ED  lactated ringers  infusion (has no administration in time range)  lactated ringers  bolus 1,000 mL (1,000 mLs Intravenous New Bag/Given 07/25/24 1427)  And  lactated ringers  bolus 1,000 mL (1,000 mLs Intravenous New Bag/Given 07/25/24 1602)    And  lactated ringers  bolus 250 mL (250 mLs Intravenous New Bag/Given 07/25/24 1603)  acetaminophen  (TYLENOL ) tablet 650 mg (has no administration in time range)    Or  acetaminophen  (TYLENOL ) suppository 650 mg (has no administration in time range)  polyethylene glycol (MIRALAX  / GLYCOLAX ) packet 17 g (has no administration in time range)  bisacodyl   (DULCOLAX) EC tablet 5 mg (has no administration in time range)  hydrALAZINE  (APRESOLINE ) injection 10 mg (has no administration in time range)  oxyCODONE  (Oxy IR/ROXICODONE ) immediate release tablet 5 mg (has no administration in time range)  senna (SENOKOT) tablet 8.6 mg (8.6 mg Oral Given 07/25/24 1603)  guaiFENesin -dextromethorphan (ROBITUSSIN DM) 100-10 MG/5ML syrup 5 mL (has no administration in time range)  guaiFENesin  (MUCINEX ) 12 hr tablet 600 mg (600 mg Oral Given 07/25/24 1603)  ipratropium-albuterol  (DUONEB) 0.5-2.5 (3) MG/3ML nebulizer solution 3 mL (has no administration in time range)  cefTRIAXone  (ROCEPHIN ) 2 g in sodium chloride  0.9 % 100 mL IVPB (2 g Intravenous New Bag/Given 07/25/24 1339)  azithromycin  (ZITHROMAX ) tablet 500 mg (500 mg Oral Given 07/25/24 1416)  iohexol  (OMNIPAQUE ) 350 MG/ML injection 75 mL (75 mLs Intravenous Contrast Given 07/25/24 1354)                                    Medical Decision Making Cardiac monitor interpretation: Sinus tachycardia, no ectopy  Patient here for shortness of breath hypoxic on 68% on room air on arrival.  No oxygen  at home.  He increased to 94% on room air and his symptoms greatly improved on high flow nasal cannula.  Patient was given 30 cc/kg of IV fluids as well as started on broad-spectrum antibiotics For pneumonia.  Workup shows multifocal pneumonia.  Discussed patient case with hospitalist and patient be admitted for further workup and management.  All results and plan discussed with patient family bedside they are agreeable with plan for admission.  Problems Addressed: Acute hypoxic respiratory failure (HCC): acute illness or injury that poses a threat to life or bodily functions History of lung cancer: chronic illness or injury with exacerbation, progression, or side effects of treatment Multifocal pneumonia: acute illness or injury that poses a threat to life or bodily functions Sepsis, due to unspecified organism,  unspecified whether acute organ dysfunction present [facility]): acute illness or injury that poses a threat to life or bodily functions  Amount and/or Complexity of Data Reviewed External Data Reviewed: notes.    Details: Prior ED records reviewed and patient was recently seen and diagnosed with pneumonia as an outpatient and started on antibiotics 2 days ago Labs: ordered. Decision-making details documented in ED Course.    Details: Ordered and reviewed by me and patient with leukocytosis as well as elevated troponin and BNP Radiology: ordered and independent interpretation performed. Decision-making details documented in ED Course.    Details: Ordered and interpreted by me independently of radiology Chest x-ray: Shows multifocal pneumonia and worsening lung cancer CT PE study: Shows no PE but does show evidence of multifocal pneumonia CT abdomen pelvis: Shows no acute abnormality ECG/medicine tests: ordered and independent interpretation performed. Decision-making details documented in ED Course.    Details: Ordered and interpreted by me in the absence of cardiology and shows sinus rhythm, no STEMI, or significant change when compared to prior EKG Discussion of management or test  interpretation with external provider(s): Dr. Arlice -hospitalist I spoke at home a phone regarding patient's case and he will meet the patient for further workup and management  Risk OTC drugs. Prescription drug management. Drug therapy requiring intensive monitoring for toxicity. Decision regarding hospitalization. Risk Details: CRITICAL CARE Performed by: Duwaine LITTIE Fusi   Total critical care time: 50 minutes  Critical care time was exclusive of separately billable procedures and treating other patients.  Critical care was necessary to treat or prevent imminent or life-threatening deterioration.  Critical care was time spent personally by me on the following activities: development of treatment plan with  patient and/or surrogate as well as nursing, discussions with consultants, evaluation of patient's response to treatment, examination of patient, obtaining history from patient or surrogate, ordering and performing treatments and interventions, ordering and review of laboratory studies, ordering and review of radiographic studies, pulse oximetry and re-evaluation of patient's condition.   Critical Care Total time providing critical care: 50 minutes     Final diagnoses:  Acute hypoxic respiratory failure (HCC)  Multifocal pneumonia  Sepsis, due to unspecified organism, unspecified whether acute organ dysfunction present [facility])  History of lung cancer    ED Discharge Orders     None          Fusi Duwaine LITTIE, DO 07/25/24 1624  "

## 2024-07-25 NOTE — ED Provider Triage Note (Signed)
 Emergency Medicine Provider Triage Evaluation Note  FERDIE BAKKEN , a 79 y.o. male  was evaluated in triage.  Pt complains of weakness, cough.  Reports he is diagnosed with pneumonia about 3 days ago.  He was found to be hypoxic in triage on room air at 68%.  He does not wear any oxygen  at baseline.  He reports that he has been having significant fatigue and weakness all over having difficulty standing.  Denies any sensation of cramping or darkening of his urine.  He states that he is on 1 antibiotic for the pneumonia he was diagnosed with.  Review of Systems  Positive: As above Negative: As above  Physical Exam  BP (!) 117/56 (BP Location: Right Arm)   Pulse (!) 110   Temp 98.1 F (36.7 C) (Oral)   SpO2 100%  Gen:   Awake, labored breathing Resp:  Increased work of breathing MSK:   Moves extremities without difficulty  Other:    Medical Decision Making  Medically screening exam initiated at 12:51 PM.  Appropriate orders placed.  [name] was informed that the remainder of the evaluation will be completed by another provider, this initial triage assessment does not replace that evaluation, and the importance of remaining in the ED until their evaluation is complete.     Faryn Sieg A, PA-C 07/25/24 1252

## 2024-07-25 NOTE — ED Triage Notes (Signed)
 Hx lung cancer. Dx pneumonia 3 days ago. No supplemental o2 at home. 68% in triage on room air. Placed on nonrebreather. Came in for weakness, not being able to stand.

## 2024-07-25 NOTE — Progress Notes (Signed)
Notified Lab that ABG being sent for analysis. 

## 2024-07-26 ENCOUNTER — Inpatient Hospital Stay (HOSPITAL_COMMUNITY)

## 2024-07-26 DIAGNOSIS — R0602 Shortness of breath: Secondary | ICD-10-CM | POA: Diagnosis not present

## 2024-07-26 DIAGNOSIS — A419 Sepsis, unspecified organism: Secondary | ICD-10-CM | POA: Diagnosis not present

## 2024-07-26 DIAGNOSIS — Z85118 Personal history of other malignant neoplasm of bronchus and lung: Secondary | ICD-10-CM

## 2024-07-26 DIAGNOSIS — J188 Other pneumonia, unspecified organism: Secondary | ICD-10-CM | POA: Diagnosis not present

## 2024-07-26 LAB — BASIC METABOLIC PANEL WITH GFR
Anion gap: 7 (ref 5–15)
BUN: 9 mg/dL (ref 8–23)
CO2: 27 mmol/L (ref 22–32)
Calcium: 9.6 mg/dL (ref 8.9–10.3)
Chloride: 104 mmol/L (ref 98–111)
Creatinine, Ser: 0.72 mg/dL (ref 0.61–1.24)
GFR, Estimated: >60 mL/min
Glucose, Bld: 94 mg/dL (ref 70–99)
Potassium: 4 mmol/L (ref 3.5–5.1)
Sodium: 138 mmol/L (ref 135–145)

## 2024-07-26 LAB — CBC
HCT: 37.7 % — ABNORMAL LOW (ref 39.0–52.0)
Hemoglobin: 12.2 g/dL — ABNORMAL LOW (ref 13.0–17.0)
MCH: 31.1 pg (ref 26.0–34.0)
MCHC: 32.4 g/dL (ref 30.0–36.0)
MCV: 96.2 fL (ref 80.0–100.0)
Platelets: 211 K/uL (ref 150–400)
RBC: 3.92 MIL/uL — ABNORMAL LOW (ref 4.22–5.81)
RDW: 12.7 % (ref 11.5–15.5)
WBC: 13.2 K/uL — ABNORMAL HIGH (ref 4.0–10.5)
nRBC: 0 % (ref 0.0–0.2)

## 2024-07-26 LAB — MRSA NEXT GEN BY PCR, NASAL: MRSA by PCR Next Gen: NOT DETECTED

## 2024-07-26 LAB — PROCALCITONIN: Procalcitonin: 0.11 ng/mL

## 2024-07-26 LAB — TROPONIN T, HIGH SENSITIVITY: Troponin T High Sensitivity: 29 ng/L — ABNORMAL HIGH (ref 0–19)

## 2024-07-26 MED ORDER — ORAL CARE MOUTH RINSE: 15.0000 mL | OROMUCOSAL | Status: AC | PRN

## 2024-07-26 MED ORDER — METHYLPREDNISOLONE SODIUM SUCC 125 MG IJ SOLR
60.0000 mg | Freq: Every day | INTRAMUSCULAR | Status: DC
  Administered 2024-07-26 – 2024-07-31 (×6): 60 mg via INTRAVENOUS
  Filled 2024-07-26 (×6): qty 2

## 2024-07-26 MED ORDER — CHLORHEXIDINE GLUCONATE CLOTH 2 % EX PADS
6.0000 | MEDICATED_PAD | Freq: Every day | CUTANEOUS | Status: AC
  Administered 2024-07-26 – 2024-08-17 (×21): 6 via TOPICAL

## 2024-07-26 NOTE — Progress Notes (Signed)
 " PROGRESS NOTE  John [name]  DOB: 17-Oct-1945  PCP: [facility] FMW:[id number]  DOA: 07/25/2024  LOS: 1 day  Hospital Day: 2  Subjective: Patient was seen and examined this morning. Lying in bed.  Alert, awake.  Oriented x 3.  Family not at bedside. Chart reviewed.  Afebrile, heart rate in 80s, blood pressure in low 100s, It seems overnight, his oxygen  requirement has worsened and is currently on 15 L oxygen  by high flow nasal cannula  Brief narrative: John [name] is a 79 y.o. 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 Sherrod.  Underwent radiation in the past.  Received last dose of immunotherapy with durvalumab  12/16. 12/30, he a chest x-ray done which resulted 10 days later on 1/10 to show bilateral pneumonia.  Patient continued to have symptoms at that time but then suddenly started having shortness of breath, cough and decreased exercise tolerance.SABRA  He does not have any supplemental oxygen  at home. 1/11, he called Dr. Jeannett office and was called in a prescription for doxycycline which he started yesterday 1/13.  1/14, today patient felt significantly short of breath and hence presented to the ED  In the ED, patient is afebrile, heart rate in 100s, blood pressure in 110s, initially very hypoxic at 68% on room air and required 15 L oxygen  by nasal cannula.   VBG with pH 7.47, pCO2 41, O2 sat 99 CBC with WC count 15, lactic acid normal, CMP unremarkable Respiratory virus panel negative  CT angio chest was negative for pulm embolism but  -showed patchy consolidation and ground glass bilaterally suggestive of multifocal pneumonia. -partially loculated moderate left pleural  effusion. -Enlarged pulmonic trunk, indicative of pulmonary arterial hypertension.  Blood culture collected Patient was started on IV Rocephin , azithromycin  Admitted to [facility]  Assessment/Plan: Multifocal infiltrates Presented with progressively worsening shortness of breath.  Did not respond to outpatient antibiotics No fever, WBC count elevated to 15, lactic acid normal, procalcitonin level normal CT scan with multifocal infiltrates read as pneumonia Blood culture sent. With a suspicion of pneumonia, patient was started on empiric antibiotic coverage with IV Rocephin , azithromycin  (EKG with QTc 427 ms).  However I believe chest infiltrates are likely due to progressive cancer. 1/15, discussed with oncologist Dr. Sherrod.  He recommended prednisone  1 g/kg given at for 7 days.  I have ordered for IV Solu-Medrol  60 mg daily. For now, continue empiric antibiotics for 3 days Also continue supportive care with Mucinex  twice daily and as needed Pulmicort, DuoNeb, incentive spirometry, flutter valve. 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 Sherrod.  Underwent radiation in the past.  Received last dose of immunotherapy with durvalumab  12/16. Consult oncology  Acute respiratory failure with hypoxia Initially hypoxic to 60 on room air and required oxygen  via 100% nrb.  Within 1 to 2 hours, oxygenation has improved and he is on 6 L oxygen .. Blood gas did not show any CO2 retention.  Within 1 to 2 hours, oxygenation has improved and he was down to 6 L.  But it seems overnight, his oxygen  requirement has worsened and is currently on 15 L oxygen  by high flow nasal cannula Continue monitor  Hypertension H/o moderate aortic regurgitation CT angio showed  enlarged pulmonary trunk indicating pulmonary artery hypertension Clinically does not look volume overloaded. Last echo from 2018 had shown moderate AI.  Repeat echo ordered PTA meds- amlodipine 10 mg daily.  Currently on hold  Continue to monitor blood pressure. Net IO Since Admission: 433.66 mL [07/26/24 1407] Recent Labs  Lab 07/25/24 1314 07/25/24 1328 07/26/24 0510  PROBNP 380.0*  --   --   LATICACIDVEN  --  1.6  --   BUN 17 16 9   CREATININE 0.92 1.00 0.72  NA 139 139 138  K 4.1 4.1 4.0     H/o prostate cancer s/p brachytherapy H/o bladder cancer  Mobility: Lives at home, ambulates with a cane.  Encourage ambulation  Goals of care:   Code Status: Full Code    DVT prophylaxis:  enoxaparin  (LOVENOX ) injection 40 mg Start: 07/25/24 1800   Antimicrobials: IV Rocephin , azithromycin  Fluid: None Consultants: Oncology Family Communication: Sister at bedside  Status: Inpatient  Level of care:  Progressive   Patient is from: Home Anticipated d/c to: Pending clinical course, pending workup.  On 15 L oxygen   Diet:  Diet Order             Diet regular Room service appropriate? Yes; Fluid consistency: Thin  Diet effective now                    Scheduled Meds:  enoxaparin  (LOVENOX ) injection  40 mg Subcutaneous Q24H   guaiFENesin   600 mg Oral BID   methylPREDNISolone  (SOLU-MEDROL ) injection  60 mg Intravenous Daily   senna  1 tablet Oral BID    PRN meds: acetaminophen  **OR** acetaminophen , bisacodyl , guaiFENesin -dextromethorphan, hydrALAZINE , ipratropium-albuterol , oxyCODONE , polyethylene glycol   Infusions:     Antimicrobials: Anti-infectives (From admission, onward)    Start     Dose/Rate Route Frequency Ordered Stop   07/25/24 1330  cefTRIAXone  (ROCEPHIN ) 2 g in sodium chloride  0.9 % 100 mL IVPB        2 g 200 mL/hr over 30 Minutes Intravenous Once 07/25/24 1315 07/25/24 1409   07/25/24 1330  azithromycin  (ZITHROMAX ) tablet 500 mg        500 mg  Oral  Once 07/25/24 1315 07/25/24 1416       Objective: Vitals:   07/26/24 1300 07/26/24 1350  BP: (!) 106/59 (!) 109/56  Pulse: 93 95  Resp: (!) 25   Temp:  98.3 F (36.8 C)  SpO2: 99% 95%    Intake/Output Summary (Last 24 hours) at 07/26/2024 1407 Last data filed at 07/26/2024 1108 Gross per 24 hour  Intake 2283.66 ml  Output 1850 ml  Net 433.66 ml   There were no vitals filed for this visit. Weight change:  There is no height or weight on file to calculate BMI.   Physical Exam:   General exam: Pleasant, elderly African-American male. Skin: No rashes, lesions or ulcers. HEENT: Atraumatic, normocephalic, no obvious bleeding Lungs: Diminished air entry in both bases.  Otherwise clear to auscultation.  No crackles or wheezing  CVS: S1, S2, no murmur,   GI/Abd: Soft, nontender, nondistended, bowel sound present,   CNS: Alert, awake, oriented x 3 Psychiatry: Mood appropriate Extremities: No pedal edema, no calf tenderness,  Data Review: I have personally reviewed the laboratory data and studies available.  F/u labs ordered Unresulted Labs (From admission, onward)     Start     Ordered   07/25/24 1504  Legionella Pneumophila Serogp 1 Ur Ag  Once,   R        07/25/24 1504   07/25/24 1502  Expectorated Sputum Assessment w Gram Stain, Rflx to Resp Cult  Once,   R        07/25/24 1503            Signed, Chapman Rota, MD [facility] 07/26/2024    "

## 2024-07-26 NOTE — Progress Notes (Signed)
 Echo attempted, pt OTF  [facility]

## 2024-07-26 NOTE — ED Notes (Signed)
I gave patient his breakfast tray 

## 2024-07-26 NOTE — Progress Notes (Signed)
 Unable to perform echo due to scheduling, will attempt asap  John [name] 07/27/2023

## 2024-07-26 NOTE — Evaluation (Signed)
 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   worked up with cardiology dr hochrein and f/u prn   Nocturia 02/11/2021   Prostate CA (HCC) 2024   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   Wears glasses 02/11/2021   Past Surgical History:  Past Surgical History:  Procedure Laterality Date   ABDOMINAL HERNIA REPAIR  1990's   BRONCHIAL BIOPSY  08/08/2023   Procedure: BRONCHIAL BIOPSIES;  Surgeon: Shelah Lamar RAMAN, MD;  Location: [facility];  Service: Pulmonary;;   BRONCHIAL BRUSHINGS  08/08/2023   Procedure: BRONCHIAL BRUSHINGS;  Surgeon: Shelah Lamar RAMAN, MD;  Location: [facility];  Service: Pulmonary;;   BRONCHIAL NEEDLE ASPIRATION BIOPSY  08/08/2023   Procedure: BRONCHIAL NEEDLE ASPIRATION BIOPSIES;  Surgeon: Shelah Lamar RAMAN, MD;  Location: [facility];  Service: Pulmonary;;   CYSTOSCOPY W/ RETROGRADES N/A 11/03/2015   Procedure: CYSTOSCOPY WITH RETROGRADE PYELOGRAM;  Surgeon: Belvie LITTIE Sherrilee, MD;  Location: [facility];  Service: Urology;  Laterality: N/A;   CYSTOSCOPY W/  RETROGRADES Left 01/09/2016   Procedure: CYSTOSCOPY WITH LEFT RETROGRADE PYELOGRAM;  Surgeon: Belvie LITTIE Sherrilee, MD;  Location: [facility];  Service: Urology;  Laterality: Left;   CYSTOSCOPY W/ RETROGRADES Bilateral 05/28/2016   Procedure: CYSTOSCOPY WITH RETROGRADE PYELOGRAM;  Surgeon: Belvie LITTIE Sherrilee, MD;  Location: [facility];  Service: Urology;  Laterality: Bilateral;   CYSTOSCOPY W/ RETROGRADES Bilateral 02/17/2021   Procedure: CYSTOSCOPY WITH RETROGRADE PYELOGRAM;  Surgeon: Rosalind Zachary NOVAK, MD;  Location: [facility];  Service: Urology;  Laterality: Bilateral;   CYSTOSCOPY W/ URETERAL STENT REMOVAL Left 01/09/2016   Procedure: CYSTOSCOPY WITH LEFT STENT EXCHANGE;  Surgeon: Belvie LITTIE Sherrilee, MD;  Location: [facility];  Service: Urology;  Laterality: Left;   CYSTOSCOPY WITH BIOPSY N/A 05/28/2016   Procedure: CYSTOSCOPY WITH BIOPSY;  Surgeon: Belvie LITTIE Sherrilee, MD;  Location: [facility];  Service: Urology;  Laterality: N/A;   CYSTOSCOPY WITH URETHRAL DILATATION N/A 05/28/2016   Procedure: CYSTOSCOPY WITH URETHRAL DILATATION;  Surgeon: Belvie LITTIE Sherrilee, MD;  Location[facility];  Service: Urology;  Laterality: N/A;   HEMOSTASIS CONTROL  08/08/2023   Procedure: HEMOSTASIS CONTROL;  Surgeon: Shelah Lamar RAMAN, MD;  Location: [facility] ENDOSCOPY;  Service: Pulmonary;;   IR IMAGING GUIDED PORT INSERTION  09/19/2023   PROSTATE BIOPSY  05/08/12   Adenocarcinoma   RADIOACTIVE SEED IMPLANT  08/01/2012   Procedure: RADIOACTIVE SEED IMPLANT;  Surgeon: Thomasine Oiler, MD;  Location: [facility];  Service: Urology;  Laterality: N/A;  73 seeds implanted no seeds found in bladder   TRANSURETHRAL RESECTION OF BLADDER TUMOR N/A 11/03/2015   Procedure: TRANSURETHRAL RESECTION OF BLADDER TUMOR (TURBT)WITH INSERTION STENT;  Surgeon: Belvie LITTIE Clara, MD;  Location: [facility];  Service:  Urology;  Laterality: N/A;   TRANSURETHRAL RESECTION OF BLADDER TUMOR N/A 02/17/2021   Procedure: TRANSURETHRAL RESECTION OF BLADDER TUMOR (TURBT);  Surgeon: Rosalind Zachary NOVAK, MD;  Location: [facility];  Service: Urology;  Laterality: N/A;  30 MINS   TRANSURETHRAL RESECTION OF BLADDER TUMOR WITH GYRUS (TURBT-GYRUS) N/A 01/09/2016   Procedure: TRANSURETHRAL RESECTION OF BLADDER TUMOR WITH GYRUS (TURBT-GYRUS);  Surgeon: Belvie LITTIE Clara, MD;  Location: [facility];  Service: Urology;  Laterality: N/A;   VIDEO BRONCHOSCOPY WITH RADIAL ENDOBRONCHIAL ULTRASOUND  08/08/2023   Procedure: VIDEO BRONCHOSCOPY WITH RADIAL ENDOBRONCHIAL ULTRASOUND;  Surgeon: Shelah Lamar RAMAN, MD;  Location: [facility];  Service: Pulmonary;;   HPI:  John [name] is a 79 y.o. male who presented to the ED from home on 07/25/24 secondary to feeling significantly SOB. In ED, he was afebrile, very hypoxic at 68% on RA, required 15L supplemental oxygen  via [place]. CT chest showed patchy consolidation and ground glass bilaterally suggestive of multifocal PNA. PMH: extensive stage small cell lung cancer s/p radiation (completed 04/27/24), bladder cancer, prostate cancer, HTN, arthritis.    Assessment / Plan / Recommendation  Clinical Impression  No SLP f/u warranted at this time, Swallow appears [facility].  Patient is not currently presenting with clinical s/s of dysphagia as per this bedside swallow evaluation. He denies current or past dysphagia, even during radiation treatment for lung cancer. He did indicate that his weight will fluctuate between 147-155lbs. SLP did not observe any overt s/s aspiration with PO intake of thin liquids and regular solids.  SLP Visit Diagnosis: Dysphagia, unspecified (R13.10)    Aspiration Risk  No limitations    Diet Recommendation Thin liquid;Regular    Liquid Administration via: Cup;Straw Medication Administration: Via alternative means (as tolerated) Supervision: Patient  able to self feed Compensations: Slow rate;Small sips/bites Postural Changes: Seated upright at 90 degrees    Other Recommendations Oral Care Recommendations: Oral care BID     Swallow Evaluation Recommendations  N/A   Assistance Recommended at Discharge    Functional Status Assessment Patient has not had a recent decline in their functional status  Frequency and Duration   N/A         Prognosis   N/A     Swallow Study   General Date of Onset: 07/25/24 HPI: John [name] is a 79 y.o. male who presented to the ED from home on 07/25/24 secondary to feeling significantly SOB. In ED, he was afebrile, very hypoxic at 68% on RA, required 15L supplemental oxygen  via [place]. CT chest showed patchy consolidation and ground glass bilaterally suggestive of multifocal PNA. PMH: extensive stage small cell lung cancer s/p radiation (completed 04/27/24), bladder cancer, prostate cancer, HTN, arthritis. Type of Study: Bedside Swallow Evaluation Previous Swallow Assessment: none found Diet Prior to this Study: Regular;Thin liquids (Level 0) Temperature Spikes Noted: No Respiratory Status: Nasal cannula History of Recent Intubation: No Behavior/Cognition: Alert;Cooperative;Pleasant mood Oral Cavity Assessment: Within Functional Limits Oral Care Completed by SLP: No Oral Cavity - Dentition: Adequate natural dentition Vision: Functional for self-feeding Self-Feeding Abilities: Able to feed self Patient Positioning: Upright in bed Baseline Vocal Quality: Normal Volitional Cough: Strong    Oral/Motor/Sensory Function Overall Oral Motor/Sensory Function: Within functional limits  Ice Chips     Thin Liquid Thin Liquid: Within functional limits Presentation: Straw;Self Fed    Nectar Thick     Honey Thick     Puree     Solid     Solid: Within functional limits Presentation: Self Fed     John IVAR Blase, MA, CCC-SLP Speech Therapy  07/26/2024,11:51 AM

## 2024-07-26 NOTE — Progress Notes (Addendum)
 John [name]   DOB:10-28-1945   FM#:[id number]      CLINICAL SUMMARY:  John [name] is a 79 year old male patient who presented on 07/25/2024 with complaints of progressive shortness of breath and cough.  Oncologic history is significant for lung cancer, on immunotherapy.  Now diagnosed with pneumonia.  Medical oncology following.    ASSESSMENT & PLAN:  Extensive stage small cell lung cancer - Diagnosed 08/08/2023 - Status post palliative RT, completed 09/19/2023 - Started on palliative chemo with carboplatin  + etoposide  + Imfinzi .  Plus G-CSF support.  Initiated 09/06/2023.  Status post 12 cycles. - Maintenance Durvalumab  planned for 07/31/2024.  Patient will be seen in outpatient oncology [facility] and further determination for this immunotherapy will be made at that time. - No inpatient therapy planned. - Medical oncology/Dr. Sherrod following closely  Shortness of breath/Cough Pneumonia Pleural effusion - Patient complains of 3 days of progressive shortness of breath with cough. - Imaging done 07/25/2024 shows multi lobar pneumonia and partially loculated moderate left pleural effusion. - Continue antibiotics as ordered - Continue steroids, on IV Solu-Medrol .  Recommend 7 days steroids therefore if patient is discharged prior, please ensure prednisone  1 mg/kg for total of 7 days steroids administration. - Continue O2 therapy and respiratory therapy - Monitor respiratory status closely  History of prostate cancer, 2013 History of urothelial carcinoma, 2022 - Continue surveillance as outpatient  Left kidney metastatic lesion - Status post SBRT completed 04/27/2024    Code Status Full  Subjective:  Patient seen awake alert and oriented x 3 laying on stretcher in ED.  Patient's 2 sisters are at bedside.  Reports that he was becoming progressively short of breath at home x 3 days and was too stubborn to come in to the hospital.  Feeling slightly better now.  Objective:    Intake/Output Summary (Last 24 hours) at 07/26/2024 0952 Last data filed at 07/26/2024 0900 Gross per 24 hour  Intake 2283.66 ml  Output 1700 ml  Net 583.66 ml     PHYSICAL EXAMINATION: ECOG PERFORMANCE STATUS: 3 - Symptomatic, >50% confined to bed  Vitals:   07/26/24 0700 07/26/24 0946  BP: 107/60 (!) 110/51  Pulse: 86 99  Resp: (!) 22 (!) 22  Temp:  (!) 97.5 F (36.4 C)  SpO2: 94% (!) 88%   There were no vitals filed for this visit.  GENERAL: alert, no distress and comfortable +chronically ill-appearing SKIN: skin color, texture, turgor are normal, no rashes or significant lesions EYES: normal, conjunctiva are pink and non-injected, sclera clear OROPHARYNX: no exudate, no erythema and lips, buccal mucosa, and tongue normal  NECK: supple, thyroid  normal size, non-tender, without nodularity LYMPH: no palpable lymphadenopathy in the cervical, axillary or inguinal LUNGS: + Diminished to auscultation  HEART: regular rate & rhythm and no murmurs and no lower extremity edema ABDOMEN: abdomen soft, non-tender and normal bowel sounds MUSCULOSKELETAL: no cyanosis of digits and no clubbing  PSYCH: alert & oriented x 3 with fluent speech NEURO: no focal motor/sensory deficits   All questions were answered. The patient knows to call the clinic with any problems, questions or concerns.   I personally spent a total of 40 minutes minutes in the care of the patient today including preparing to see the patient, getting/reviewing separately obtained history, performing a medically appropriate exam/evaluation, referring and communicating with other health care professionals, documenting clinical information in the EHR, communicating results, and coordinating care.    Olam PARAS Wenona [place], NP 07/26/2024 9:52 AM  Labs Reviewed:  Lab Results  Component Value Date   WBC 13.2 (H) 07/26/2024   HGB 12.2 (L) 07/26/2024   HCT 37.7 (L) 07/26/2024   MCV 96.2 07/26/2024   PLT 211 07/26/2024    Recent Labs    05/29/24 1450 05/29/24 1501 06/26/24 0831 07/25/24 1314 07/25/24 1328 07/26/24 0510  NA 142   < > 139 139 139 138  K 4.0   < > 4.4 4.1 4.1 4.0  CL 103   < > 104 104 103 104  CO2 28  --  29 29  --  27  GLUCOSE 136*   < > 160* 197* 195* 94  BUN 15   < > 11 17 16 9   CREATININE 0.96   < > 0.93 0.92 1.00 0.72  CALCIUM 10.2  --  10.0 10.3  --  9.6  GFRNONAA >60  --  >60 >60  --  >60  PROT 7.2  --  6.8 7.3  --   --   ALBUMIN 3.9  --  3.7 3.4*  --   --   AST 20  --  18 27  --   --   ALT 13  --  10 11  --   --   ALKPHOS 78  --  78 123  --   --   BILITOT 0.4  --  0.4 0.5  --   --    < > = values in this interval not displayed.    Studies Reviewed:   CT Angio Chest Pulmonary Embolism (PE) W or WO Contrast Result Date: 07/25/2024 CLINICAL DATA:  Lung cancer recent pneumonia, decreased O2 sats weakness. Shortness of breath and abdominal pain. * Tracking Code: BO * EXAM: CT ANGIOGRAPHY CHEST CT ABDOMEN AND PELVIS WITH CONTRAST TECHNIQUE: Multidetector CT imaging of the chest was performed using the standard protocol during bolus administration of intravenous contrast. Multiplanar CT image reconstructions and MIPs were obtained to evaluate the vascular anatomy. Multidetector CT imaging of the abdomen and pelvis was performed using the standard protocol during bolus administration of intravenous contrast. RADIATION DOSE REDUCTION: This exam was performed according to the departmental dose-optimization program which includes automated exposure control, adjustment of the mA and/or kV according to patient size and/or use of iterative reconstruction technique. CONTRAST:  75mL OMNIPAQUE  IOHEXOL  350 MG/ML SOLN COMPARISON:  CT abdomen pelvis 05/29/2024 PET 03/05/2024 and CT chest 07/25/2024. FINDINGS: CTA CHEST FINDINGS Cardiovascular: Negative for pulmonary embolus. Right IJ Port-A-Cath terminates in the low SVC or SVC RA junction. Atherosclerotic calcification of the aorta and coronary  arteries. Enlarged pulmonic trunk and heart. Left ventricle is dilated. No pericardial effusion. Mediastinum/Nodes: Mediastinal lymph nodes measure up to 13 mm in the low right paratracheal station, previously 6 mm. Soft tissue thickening in the left hilum. Right hilar lymph nodes measure up to 10 mm. Air in the esophagus can be seen with dysmotility. Lungs/Pleura: Centrilobular and paraseptal emphysema. Patchy consolidation and ground-glass bilaterally, new from 03/05/2024. Post radiation architectural distortion in the upper and lower lobes. Moderate partially loculated left pleural effusion. Airway is unremarkable. Musculoskeletal: Degenerative changes in the spine. No worrisome lytic or sclerotic lesions. Review of the MIP images confirms the above findings. CT ABDOMEN and PELVIS FINDINGS Hepatobiliary: Tiny low-attenuation lesion in the left hepatic lobe, too small to characterize. Liver and gallbladder are otherwise unremarkable. No biliary ductal dilatation. Pancreas: Negative. Spleen: Negative. Adrenals/Urinary Tract: Adrenal glands are unremarkable. Low-attenuation lesions in the kidneys. No specific follow-up necessary. Ureters are decompressed.  Bladder is grossly unremarkable. Stomach/Bowel: Stomach, small bowel, appendix and colon are unremarkable. Vascular/Lymphatic: Atherosclerotic calcification of the aorta. No pathologically enlarged lymph nodes. 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 Other: No free fluid. Musculoskeletal: Degenerative changes in the spine. Dextroconvex scoliosis. No worrisome lytic or sclerotic lesions. Review of the MIP images confirms the above findings. IMPRESSION: 1. Negative for pulmonary embolus. 2. Multilobar pneumonia. 3. 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 Evaluation for recurrent disease is challenging due to surrounding acute ground-glass and consolidation. 4. Interval enlargement of mediastinal lymph nodes may be reactive in  etiology. Recommend attention on follow-up. 5. Partially loculated moderate left pleural effusion. 6. Aortic atherosclerosis (ICD10-I70.0). Coronary artery calcification. 7. Enlarged pulmonic trunk, indicative of pulmonary arterial hypertension. Electronically Signed   By: Newell Eke M.D.   On: 07/25/2024 14:29   CT ABDOMEN PELVIS WO CONTRAST Result Date: 07/25/2024 CLINICAL DATA:  Lung cancer recent pneumonia, decreased O2 sats weakness. Shortness of breath and abdominal pain. * Tracking Code: BO * EXAM: CT ANGIOGRAPHY CHEST CT ABDOMEN AND PELVIS WITH CONTRAST TECHNIQUE: Multidetector CT imaging of the chest was performed using the standard protocol during bolus administration of intravenous contrast. Multiplanar CT image reconstructions and MIPs were obtained to evaluate the vascular anatomy. Multidetector CT imaging of the abdomen and pelvis was performed using the standard protocol during bolus administration of intravenous contrast. RADIATION DOSE REDUCTION: This exam was performed according to the departmental dose-optimization program which includes automated exposure control, adjustment of the mA and/or kV according to patient size and/or use of iterative reconstruction technique. CONTRAST:  75mL OMNIPAQUE  IOHEXOL  350 MG/ML SOLN COMPARISON:  CT abdomen pelvis 05/29/2024 PET 03/05/2024 and CT chest 07/25/2024. FINDINGS: CTA CHEST FINDINGS Cardiovascular: Negative for pulmonary embolus. Right IJ Port-A-Cath terminates in the low SVC or SVC RA junction. Atherosclerotic calcification of the aorta and coronary arteries. Enlarged pulmonic trunk and heart. Left ventricle is dilated. No pericardial effusion. Mediastinum/Nodes: Mediastinal lymph nodes measure up to 13 mm in the low right paratracheal station, previously 6 mm. Soft tissue thickening in the left hilum. Right hilar lymph nodes measure up to 10 mm. Air in the esophagus can be seen with dysmotility. Lungs/Pleura: Centrilobular and paraseptal  emphysema. Patchy consolidation and ground-glass bilaterally, new from 03/05/2024. Post radiation architectural distortion in the upper and lower lobes. Moderate partially loculated left pleural effusion. Airway is unremarkable. Musculoskeletal: Degenerative changes in the spine. No worrisome lytic or sclerotic lesions. Review of the MIP images confirms the above findings. CT ABDOMEN and PELVIS FINDINGS Hepatobiliary: Tiny low-attenuation lesion in the left hepatic lobe, too small to characterize. Liver and gallbladder are otherwise unremarkable. No biliary ductal dilatation. Pancreas: Negative. Spleen: Negative. Adrenals/Urinary Tract: Adrenal glands are unremarkable. Low-attenuation lesions in the kidneys. No specific follow-up necessary. Ureters are decompressed. Bladder is grossly unremarkable. Stomach/Bowel: Stomach, small bowel, appendix and colon are unremarkable. Vascular/Lymphatic: Atherosclerotic calcification of the aorta. No pathologically enlarged lymph nodes. 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 Other: No free fluid. Musculoskeletal: Degenerative changes in the spine. Dextroconvex scoliosis. No worrisome lytic or sclerotic lesions. Review of the MIP images confirms the above findings. IMPRESSION: 1. Negative for pulmonary embolus. 2. Multilobar pneumonia. 3. 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 Evaluation for recurrent disease is challenging due to surrounding acute ground-glass and consolidation. 4. Interval enlargement of mediastinal lymph nodes may be reactive in etiology. Recommend attention on follow-up. 5. Partially loculated moderate left pleural effusion. 6. Aortic atherosclerosis (ICD10-I70.0). Coronary artery calcification.  7. Enlarged pulmonic trunk, indicative of pulmonary arterial hypertension. Electronically Signed   By: Newell Eke M.D.   On: 07/25/2024 14:29   DG Chest Port 1 View Result Date: 07/25/2024 EXAM: 1 VIEW(S) XRAY OF THE CHEST  07/25/2024 01:55:00 AM COMPARISON: 07/10/2024 CLINICAL HISTORY: Questionable sepsis; evaluate for abnormality. FINDINGS: LINES, TUBES AND DEVICES: Right chest port with tip in the right atrium. LUNGS AND PLEURA: New airspace opacity in Right Lower Lung Zone. Hazy opacity throughout Left Lung. Bandlike scarring in Right Mid, unchanged. Elevated Left Hemidiaphragm. Small Left Pleural Effusion. Hazy airspace opacities have worsened throughout the Left Lung. Patchy airspace opacities in the Right Lung Base, somewhat more nodular, worsened in the interim. No pneumothorax. HEART AND MEDIASTINUM: The tip of the right chest port is in the right atrium. The cardiac and mediastinal silhouettes are otherwise without acute abnormality. BONES AND SOFT TISSUES: No acute osseous abnormality. Multilevel thoracic osteophytosis. IMPRESSION: 1. Worsening bilateral airspace opacities, including worsening patchy right basilar opacities and worsening hazy opacities throughout the left lung, concerning for worsening multifocal pneumonia. 2. Small left pleural effusion. Electronically signed by: Rogelia Myers MD 07/25/2024 02:06 PM EST RP Workstation: [id number]   DG Chest 2 View Result Date: 07/21/2024 CLINICAL DATA:  Shortness of breath. Left lung cancer treated with chemotherapy and radiation therapy. EXAM: CHEST - 2 VIEW COMPARISON:  05/29/2024 and chest CTA dated 05/12/2024. FINDINGS: Normal-sized heart. Bilateral postradiation changes with progression in the left upper lung zone and interval atelectasis in the right upper lung zone. Interval mild patchy density in both lower lung zones. Stable elevation of the left hemidiaphragm with tenting. The right jugular porta catheter is unchanged with its tip in the region of the superior cavoatrial junction. Mild scoliosis. Mild-to-moderate right glenohumeral degenerative changes. IMPRESSION: 1. Interval mild patchy density in both lower lung zones, suspicious for pneumonia. 2. Bilateral  postradiation changes with progression in the left upper lung zone and interval atelectasis in the right upper lung zone. Electronically Signed   By: Elspeth Bathe M.D.   On: 07/21/2024 15:55

## 2024-07-27 ENCOUNTER — Inpatient Hospital Stay (HOSPITAL_COMMUNITY)

## 2024-07-27 ENCOUNTER — Other Ambulatory Visit: Payer: Self-pay

## 2024-07-27 ENCOUNTER — Encounter (HOSPITAL_COMMUNITY): Payer: Self-pay | Admitting: Internal Medicine

## 2024-07-27 DIAGNOSIS — J90 Pleural effusion, not elsewhere classified: Secondary | ICD-10-CM | POA: Diagnosis not present

## 2024-07-27 DIAGNOSIS — J9601 Acute respiratory failure with hypoxia: Secondary | ICD-10-CM | POA: Diagnosis not present

## 2024-07-27 DIAGNOSIS — I351 Nonrheumatic aortic (valve) insufficiency: Secondary | ICD-10-CM | POA: Diagnosis not present

## 2024-07-27 DIAGNOSIS — C3490 Malignant neoplasm of unspecified part of unspecified bronchus or lung: Secondary | ICD-10-CM | POA: Diagnosis not present

## 2024-07-27 DIAGNOSIS — J188 Other pneumonia, unspecified organism: Secondary | ICD-10-CM | POA: Diagnosis not present

## 2024-07-27 LAB — LEGIONELLA PNEUMOPHILA SEROGP 1 UR AG: L. pneumophila Serogp 1 Ur Ag: NEGATIVE

## 2024-07-27 LAB — ECHOCARDIOGRAM COMPLETE
Area-P 1/2: 3.55 cm2
Calc EF: 62.6 %
P 1/2 time: 611 ms
S' Lateral: 2.5 cm
Single Plane A2C EF: 66.8 %
Single Plane A4C EF: 49.7 %

## 2024-07-27 MED ORDER — ENSURE PLUS HIGH PROTEIN PO LIQD
237.0000 mL | Freq: Two times a day (BID) | ORAL | Status: AC
  Administered 2024-07-28 – 2024-08-17 (×29): 237 mL via ORAL

## 2024-07-27 MED ORDER — PIPERACILLIN-TAZOBACTAM 3.375 G IVPB
3.3750 g | Freq: Three times a day (TID) | INTRAVENOUS | Status: AC
  Administered 2024-07-27 – 2024-08-03 (×21): 3.375 g via INTRAVENOUS
  Filled 2024-07-27 (×21): qty 50

## 2024-07-27 MED ORDER — PNEUMOCOCCAL 20-VAL CONJ VACC 0.5 ML IM SUSY
0.5000 mL | PREFILLED_SYRINGE | INTRAMUSCULAR | Status: AC
  Filled 2024-07-27: qty 0.5

## 2024-07-27 MED ORDER — SODIUM CHLORIDE 0.9% FLUSH
10.0000 mL | Freq: Two times a day (BID) | INTRAVENOUS | Status: AC
  Administered 2024-07-27: 10 mL
  Administered 2024-07-27 – 2024-07-28 (×3): 20 mL
  Administered 2024-07-29: 10 mL
  Administered 2024-07-29: 30 mL
  Administered 2024-07-30 – 2024-07-31 (×3): 20 mL
  Administered 2024-07-31: 10 mL
  Administered 2024-08-01: 20 mL
  Administered 2024-08-02 – 2024-08-07 (×11): 10 mL
  Administered 2024-08-07: 20 mL
  Administered 2024-08-08: 10 mL
  Administered 2024-08-08: 20 mL
  Administered 2024-08-09: 10 mL
  Administered 2024-08-09: 20 mL
  Administered 2024-08-10 – 2024-08-16 (×12): 10 mL
  Administered 2024-08-16: 20 mL
  Administered 2024-08-17 (×2): 10 mL

## 2024-07-27 MED ORDER — LACTATED RINGERS IV BOLUS
500.0000 mL | Freq: Once | INTRAVENOUS | Status: AC
  Administered 2024-07-27: 500 mL via INTRAVENOUS

## 2024-07-27 MED ORDER — SODIUM CHLORIDE 0.9% FLUSH: 10.0000 mL | INTRAVENOUS | Status: AC | PRN

## 2024-07-27 NOTE — Plan of Care (Signed)
  Problem: Nutrition: Goal: Adequate nutrition will be maintained Outcome: Progressing   Problem: Coping: Goal: Level of anxiety will decrease Outcome: Progressing   Problem: Pain Managment: Goal: General experience of comfort will improve and/or be controlled Outcome: Progressing   Problem: Skin Integrity: Goal: Risk for impaired skin integrity will decrease Outcome: Progressing

## 2024-07-27 NOTE — Progress Notes (Signed)
" °   07/27/24 1630  TOC Brief Assessment  Insurance and Status Reviewed (DEVOTED HEALTH / DEVOTED HEALTH - [place])  Patient has primary care physician Yes ([facility])  Home environment has been reviewed Apartment  Prior level of function: Mod independent  Prior/Current Home Services No current home services  Social Drivers of Health Review SDOH reviewed no interventions necessary  Readmission risk has been reviewed Yes  Transition of care needs transition of care needs identified, TOC will continue to follow   Pt screened. Pt currently requiring 45L O2 HHFNC. ICM will continue to follow for DC planning needs. "

## 2024-07-27 NOTE — Consult Note (Signed)
 "  NAME:  John [name], MRN:  [id number], DOB:  1946-06-22, LOS: 2 ADMISSION DATE:  07/25/2024, CONSULTATION DATE:  07/27/24 REFERRING MD:  Dr. Arlice, CHIEF COMPLAINT:  SOB   History of Present Illness:   61 yoM with PMH significant for former smoker (quit 20-66yrs ago), metastatic small cell lung cancer., prostate cancer 2013 and urothelial carcinoma 2022, left kidney lesion s/p SBRT 04/2024, HTN, aortic regurgitation, type 1 second degree AV block, cataracts   Pt is followed by Dr. Sherrod with oncology for metastatic small cell lung cancer.  Initially dx 07/2023, s/p palliative RT completed 09/19/23, and palliative chemo with carboplatin  + etoposide  + durvalumab , currently maintained on durvalumab , last dose 12/17.  On CXR 12/30 developed bilateral lobe patchy infiltrates.  Started having some exertional SOB.  Was called in rx doxycycline that he started 1/13 and then presented to ER 1/14 due to worsening SOB.  Denies cough but reports several days ago had speck of blood but nothing since.   Denies fever, chills, cough, chest pain, sick contacts, orthopnea, N/V, dysphagia or choking episodes, or LE swelling/ pain.  Lives alone, uses a cane, and reports is otherwise independent in ADLs except for doesn't drive anymore.  Is not on home oxygen  or uses any inhalers/ nebs at baseline.   Found to be hypoxic on admit at 68% on RA, afebrile, WBC 15, PCT 0.11, SARs, flu, RSV neg, MRSA PCR neg.  CTA PE neg for PE but showing centrilobular and paraseptal emphysema with patchy consolidation and GGO bilaterally, moderate partially loculated left pleural effusion, and s/p radiation changes in upper and lower lobes bilaterally, interval enlargement of mediastinal lymph nodes, and enlarged pulmonic trunk indicative of pulmonary arterial hypertension. Admitted to [facility].  S/p empiric azithro and ctx on 1/14.  Oncology following, changed to [facility] and started on steroids started 1/15.  Pulmonary consulted 1/16 given CT findings  and ongoing HHFNC of 60% and 50L.  Pt reports feeling better since admit.  Currently no SOB at rest and continues to deny cough.    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   worked up with cardiology dr hochrein and f/u prn   Nocturia 02/11/2021   Prostate CA (HCC) 2024   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   Wears glasses 02/11/2021    Significant Hospital Events: Including procedures, antibiotic start and stop dates in addition to other pertinent events   07/25/24 Admit 1/15 steroids added, HHFNC 80%, 50L  Interim History / Subjective:  Currently on 60%, 50L  Objective    Blood pressure (!) 105/51, pulse 78, temperature 98.8 F (37.1 C), temperature source Axillary, resp. rate 16, SpO2 95%.    FiO2 (%):  [50 %-80 %] 60 %   Intake/Output Summary (Last 24 hours)  at 07/27/2024 1217 Last data filed at 07/27/2024 1000 Gross per 24 hour  Intake 762.71 ml  Output 600 ml  Net 162.71 ml   There were no vitals filed for this visit.  Examination: General:  pleasant thin elderly male sitting in bed in NAD HEENT: MM pink/moist Neuro: Aox4, MAE CV: rr, NSR, port right chest accessed, appears euvolemic  PULM:  non labored, speaking in full sentences, no obvious dyspnea, lung clear and diminished except for right posterior basilar rales, no  wheeze GI: soft,+bs, NT Extremities: warm/dry, no LE edema  Skin: no rashes   Afebrile Labs and images personally reviewed  Resolved problem list   Assessment and Plan   Acute hypoxic respiratory failure Metastatic small cell lung cancer, s/p palliative chemo/ radiation currently on durvalumab  (since 08/2023), last dose 12/17 Multifocal GGO- ddx include pneumonia, pneumonitis vs lymphangitic spread Left partially loculated pleural effusion, new from imaging 05/29/24 - SARS/ flu/ RSV, MRSA PCR neg - urine strep neg - TTE 1/16> EF 60-65%, normal RSVF, no RWMA, trivial MR, mild to mod AR P:  - cont to wean HHFNC for sat goal > 92%.  Slowly improving O2 requirements from yesterday.  No significant WOB currently, cont to wean flow.  - remains afebrile, reassuring PCT, and improving leukocytosis after abx 1/13- 1/14, but will resume empiric zosyn  for now.  Repeat PCT in am, monitor fever/ WBC trend - cont solumedrol 60mg  daily  - pending urine legionella  - ongoing pulm hygiene with IS, flutter, mucolytic, prn duonebs   Pulmonary will see again 1/17.  After discussion with Dr. Adrien- pt has elected to change his code status to DNR/ DNI, see Dr. Margaretann note.    Labs   CBC: Recent Labs  Lab 07/25/24 1314 07/25/24 1328 07/26/24 0510  WBC 15.0*  --  13.2*  NEUTROABS 13.3*  --   --   HGB 13.7 13.9 12.2*  HCT 42.3 41.0 37.7*  MCV 96.6  --  96.2  PLT 240  --  211    Basic Metabolic Panel: Recent Labs  Lab 07/25/24 1314 07/25/24 1328 07/26/24 0510  NA 139 139 138  K 4.1 4.1 4.0  CL 104 103 104  CO2 29  --  27  GLUCOSE 197* 195* 94  BUN 17 16 9   CREATININE 0.92 1.00 0.72  CALCIUM 10.3  --  9.6   GFR: CrCl cannot be calculated (Unknown ideal weight.). Recent Labs  Lab 07/25/24 1314 07/25/24 1328 07/26/24 0510  PROCALCITON  --   --  0.11  WBC 15.0*  --  13.2*  LATICACIDVEN  --  1.6  --     Liver Function Tests: Recent Labs  Lab 07/25/24 1314  AST 27  ALT  11  ALKPHOS 123  BILITOT 0.5  PROT 7.3  ALBUMIN 3.4*   No results for input(s): LIPASE, AMYLASE in the last 168 hours. No results for input(s): AMMONIA in the last 168 hours.  ABG    Component Value Date/Time   PHART 7.47 (H) 07/25/2024 1316   PCO2ART 41 07/25/2024 1316   PO2ART 121 (H) 07/25/2024 1316   HCO3 29.8 (H) 07/25/2024 1316   TCO2 26 07/25/2024 1328   O2SAT 99.1 07/25/2024 1316     Coagulation Profile: Recent Labs  Lab 07/25/24 1314  INR 1.1    Cardiac Enzymes: No results for input(s): CKTOTAL, CKMB, CKMBINDEX, TROPONINI in the last 168 hours.  HbA1C: No results found for: HGBA1C  CBG: No results for input(s): GLUCAP  in the last 168 hours.  Review of Systems:   Positive for SOB and exertional SOB otherwise negative.  See HPI  Past Medical History:  He,  has a past medical history of Aortic regurgitation, Arthritis (02/11/2021), Bladder cancer (HCC), Frequency of urination (02/11/2021), History of prostate cancer (2014), Hydrocele, Hypertension (02/11/2021), Mobitz type 1 second degree atrioventricular block (08/11/2018), Nocturia (02/11/2021), Prostate CA (HCC) (2024), Psoriasis (02/11/2021), Small cell lung cancer (HCC) (08/2023), and Wears glasses (02/11/2021).   Surgical History:   Past Surgical History:  Procedure Laterality Date   ABDOMINAL HERNIA REPAIR  1990's   BRONCHIAL BIOPSY  08/08/2023   Procedure: BRONCHIAL BIOPSIES;  Surgeon: Shelah Lamar RAMAN, MD;  Location: [facility] ENDOSCOPY;  Service: Pulmonary;;   BRONCHIAL BRUSHINGS  08/08/2023   Procedure: BRONCHIAL BRUSHINGS;  Surgeon: Shelah Lamar RAMAN, MD;  Location: [facility] ENDOSCOPY;  Service: Pulmonary;;   BRONCHIAL NEEDLE ASPIRATION BIOPSY  08/08/2023   Procedure: BRONCHIAL NEEDLE ASPIRATION BIOPSIES;  Surgeon: Shelah Lamar RAMAN, MD;  Location: [facility] ENDOSCOPY;  Service: Pulmonary;;   CYSTOSCOPY W/ RETROGRADES N/A 11/03/2015   Procedure: CYSTOSCOPY WITH RETROGRADE PYELOGRAM;  Surgeon: Belvie LITTIE Clara, MD;  Location: [facility];  Service: Urology;  Laterality: N/A;   CYSTOSCOPY W/ RETROGRADES Left 01/09/2016   Procedure: CYSTOSCOPY WITH LEFT RETROGRADE PYELOGRAM;  Surgeon: Belvie LITTIE Clara, MD;  Location: [facility];  Service: Urology;  Laterality: Left;   CYSTOSCOPY W/ RETROGRADES Bilateral 05/28/2016   Procedure: CYSTOSCOPY WITH RETROGRADE PYELOGRAM;  Surgeon: Belvie LITTIE Clara, MD;  Location[facility];  Service: Urology;  Laterality: Bilateral;   CYSTOSCOPY W/ RETROGRADES Bilateral 02/17/2021   Procedure: CYSTOSCOPY WITH RETROGRADE PYELOGRAM;  Surgeon: Rosalind Zachary NOVAK, MD;  Location: [facility];  Service: Urology;  Laterality: Bilateral;   CYSTOSCOPY W/ URETERAL STENT REMOVAL Left 01/09/2016   Procedure: CYSTOSCOPY WITH LEFT STENT EXCHANGE;  Surgeon: Belvie LITTIE Clara, MD;  Location: [facility];  Service: Urology;  Laterality: Left;   CYSTOSCOPY WITH BIOPSY N/A 05/28/2016   Procedure: CYSTOSCOPY WITH BIOPSY;  Surgeon: Belvie LITTIE Clara, MD;  Location: [facility];  Service: Urology;  Laterality: N/A;   CYSTOSCOPY WITH URETHRAL DILATATION N/A 05/28/2016   Procedure: CYSTOSCOPY WITH URETHRAL DILATATION;  Surgeon: Belvie LITTIE Clara, MD;  Location: [facility];  Service: Urology;  Laterality: N/A;   HEMOSTASIS CONTROL  08/08/2023   Procedure: HEMOSTASIS CONTROL;  Surgeon: Shelah Lamar RAMAN, MD;  Location: [facility] ENDOSCOPY;  Service: Pulmonary;;   IR IMAGING GUIDED PORT INSERTION  09/19/2023   PROSTATE BIOPSY  05/08/12   Adenocarcinoma   RADIOACTIVE SEED IMPLANT  08/01/2012   Procedure: RADIOACTIVE SEED IMPLANT;  Surgeon: Thomasine Oiler, MD;  Location: [facility];  Service: Urology;  Laterality: N/A;  73 seeds implanted no seeds found in bladder   TRANSURETHRAL RESECTION OF BLADDER TUMOR N/A 11/03/2015   Procedure: TRANSURETHRAL RESECTION OF BLADDER TUMOR  (TURBT)WITH INSERTION STENT;  Surgeon: Belvie LITTIE Clara, MD;  Location: [facility];  Service: Urology;  Laterality: N/A;   TRANSURETHRAL RESECTION OF BLADDER TUMOR N/A 02/17/2021   Procedure: TRANSURETHRAL RESECTION OF BLADDER TUMOR (TURBT);  Surgeon: Rosalind Zachary NOVAK, MD;  Location: [facility];  Service: Urology;  Laterality: N/A;  30 MINS   TRANSURETHRAL RESECTION OF BLADDER TUMOR WITH GYRUS (TURBT-GYRUS) N/A 01/09/2016   Procedure: TRANSURETHRAL RESECTION OF BLADDER TUMOR WITH GYRUS (TURBT-GYRUS);  Surgeon: Belvie LITTIE Clara, MD;  Location: [facility];  Service: Urology;  Laterality: N/A;   VIDEO BRONCHOSCOPY WITH RADIAL ENDOBRONCHIAL ULTRASOUND  08/08/2023   Procedure: VIDEO BRONCHOSCOPY WITH RADIAL ENDOBRONCHIAL ULTRASOUND;  Surgeon: Shelah Lamar RAMAN, MD;  Location: [facility] ENDOSCOPY;  Service: Pulmonary;;     Social History:   reports that he quit smoking about 9 years ago. His smoking use included cigarettes. He started smoking about 51 years ago. He has a 21 pack-year smoking history. He has never used smokeless tobacco. He reports current alcohol use. He reports current drug use. Drug: Marijuana.   Family History:  His family history includes Aneurysm in his father; Asthma in his mother and sister; Heart failure in his mother; Hyperlipidemia in his mother and sister.   Allergies Allergies[1]   Home Medications  Prior to Admission medications  Medication Sig Start Date End Date Taking? Authorizing Provider  albuterol  (VENTOLIN  HFA) 108 (90 Base) MCG/ACT inhaler Inhale 1-2 puffs into the lungs every 6 (six) hours as needed for wheezing or shortness of breath. 05/12/24  Yes Yolande Lamar BROCKS, MD  amLODipine (NORVASC) 10 MG tablet Take 10 mg by mouth daily.   Yes [provider]  APIXABAN  (ELIQUIS ) VTE STARTER PACK (10MG  AND 5MG ) Take as directed on package: start with two-5mg  tablets twice daily for 7 days. On day 8, switch to one-5mg   tablet twice daily. Patient not taking: Reported on 07/25/2024 05/12/24   Yolande Lamar BROCKS, MD  lidocaine -prilocaine  (EMLA ) cream Apply 1 Application topically as needed. Patient not taking: Reported on 07/25/2024 08/25/23   Heilingoetter, Cassandra L, PA-C  memantine  (NAMENDA ) 10 MG tablet Take 1 tablet (10 mg total) by mouth 2 (two) times daily. Patient not taking: Reported on 07/25/2024 12/07/23   Lanell Donald Stagger, PA-C  memantine  (NAMENDA ) 5 MG tablet Begin this prescription the first day of brain radiation. Week 1: take one tablet po qam. Week 2: take one tablet qam and qpm. Week 3: take two tablets qam, and one tablet po q pm. Week 4: take two tablets qam and qpm. Fill subsequent prescription q month. Patient not taking: Reported on 07/25/2024 12/07/23   Lanell Donald Stagger, PA-C  prochlorperazine  (COMPAZINE ) 10 MG tablet Take 1 tablet (10 mg total) by mouth every 6 (six) hours as needed. Patient not taking: Reported on 07/25/2024 08/25/23   Heilingoetter, Cassandra L, PA-C  psyllium (METAMUCIL SMOOTH TEXTURE) 58.6 % powder Take 1 packet by mouth 3 (three) times daily. Patient not taking: Reported on 07/25/2024 05/29/24   Kingsley, Victoria K, DO  senna-docusate (SENOKOT-S) 8.6-50 MG tablet Take 1 tablet by mouth daily. Patient not taking: Reported on 07/25/2024 05/29/24   Ellouise Richerd POUR, DO     Critical care time: n/a     Lyle Pesa, NP [facility] 07/27/2024, 12:17 PM  See Amion for pager If no response to pager , please call [phone] until 7pm After 7:00 pm call Elink  [phone]            [1] No Known Allergies  "

## 2024-07-27 NOTE — Progress Notes (Signed)
" °  Echocardiogram 2D Echocardiogram has been performed.  Tinnie FORBES Gosling RDCS 07/27/2024, 8:47 AM "

## 2024-07-27 NOTE — Plan of Care (Signed)
  Problem: Clinical Measurements: Goal: Diagnostic test results will improve Outcome: Progressing Goal: Respiratory complications will improve Outcome: Progressing Goal: Cardiovascular complication will be avoided Outcome: Progressing   Problem: Activity: Goal: Risk for activity intolerance will decrease Outcome: Progressing   Problem: Nutrition: Goal: Adequate nutrition will be maintained Outcome: Progressing   Problem: Elimination: Goal: Will not experience complications related to bowel motility Outcome: Progressing Goal: Will not experience complications related to urinary retention Outcome: Progressing   

## 2024-07-27 NOTE — Progress Notes (Addendum)
 " PROGRESS NOTE  John [name]  DOB: August 09, 1945  PCP: [facility] FMW:[id number]  DOA: 07/25/2024  LOS: 2 days  Hospital Day: 3  Subjective: Patient was seen and examined this morning. Lying on bed.  Denies any complaint. Afebrile, in the last 24 hours, heart rate has been in 80s and 90s but blood pressure has been .  Low in 90s and 100s. He is oxygen  requirement has worsened.  This morning he is on 50 L/min heated high flow oxygen   Lying in bed.  Alert, awake.  Oriented x 3.  Family not at bedside. Chart reviewed.  Afebrile, heart rate in 80s, blood pressure in low 100s, It seems overnight, his oxygen  requirement has worsened and is currently on 15 L oxygen  by high flow nasal cannula  Brief narrative: John [name] is a 79 y.o. 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 Sherrod.  Underwent radiation in the past.  Received last dose of immunotherapy with durvalumab  12/16. 12/30, he a chest x-ray done which resulted 10 days later on 1/10 to show bilateral pneumonia.  Patient continued to have symptoms at that time but then suddenly started having shortness of breath, cough and decreased exercise tolerance.John [name]  He does not have any supplemental oxygen  at home. 1/11, he called Dr. Jeannett office and was called in a prescription for doxycycline which he started yesterday 1/13.  1/14, today patient felt significantly short of breath and hence presented to the ED  In the ED, patient is afebrile, heart rate in 100s, blood pressure in 110s, initially very hypoxic at 68% on room air and required 15 L oxygen  by nasal cannula.   VBG with pH 7.47, pCO2 41, O2 sat 99 CBC with WC count 15, lactic acid normal,  CMP unremarkable Respiratory virus panel negative  CT angio chest was negative for pulm embolism but  -showed patchy consolidation and ground glass bilaterally suggestive of multifocal pneumonia. -partially loculated moderate left pleural effusion. -Enlarged pulmonic trunk, indicative of pulmonary arterial hypertension.  Blood culture collected Patient was started on IV Rocephin , azithromycin  Admitted to [facility]  In the subsequent 48 hours since admission, patient's oxygen  requirement worsened significantly up to 50 L heated high flow nasal cannula.  Assessment/Plan: Acute respiratory failure with hypoxia Multifocal infiltrates Presented with progressively worsening shortness of breath.  Did not respond to outpatient antibiotics No fever, WBC count elevated to 15, lactic acid normal, procalcitonin level normal CT scan ruled out PE, showed multifocal infiltrates read as pneumonia Blood culture sent. With a suspicion of pneumonia, patient was given empiric antibiotic coverage with IV Rocephin , azithromycin  (EKG with QTc 427 ms).  However I believe chest infiltrates are likely due to progressive cancer. 1/15, discussed with oncologist Dr. Sherrod.  He recommended prednisone  1 g/kg given at for 7 days.  I ordered for IV Solu-Medrol  60 mg daily. 1/16, his oxygenation has worsened. In the last 48 hours since admission, patient's oxygen  requirement worsened significantly up to 50 L heated high flow nasal cannula.  I consulted pulmonology today Defer to pulmonology for any needed antibiotic. Also continue supportive care with Mucinex  twice daily and as needed Pulmicort, DuoNeb, incentive spirometry, flutter valve. Recent Labs  Lab 07/25/24 1314 07/25/24 1328 07/26/24 0510  WBC  15.0*  --  13.2*  LATICACIDVEN  --  1.6  --   PROCALCITON  --   --  0.11   Small cell lung cancer on chemotherapy Patient has extensive stage small cell lung cancer of the left upper lobe, follows up with oncologist Dr.  Sherrod.  Underwent radiation in the past.  Received last dose of immunotherapy with durvalumab  12/16. Oncology following  Hypertension H/o moderate aortic regurgitation CT angio showed enlarged pulmonary trunk indicating pulmonary artery hypertension Clinically does not look volume overloaded. Last echo from 2018 had shown moderate AI.  Repeat echo report pending PTA meds- amlodipine 10 mg daily.  Currently on hold.  Continue to monitor blood pressure. Net IO Since Admission: 836.37 mL [07/27/24 1516] Recent Labs  Lab 07/25/24 1314 07/25/24 1328 07/26/24 0510  PROBNP 380.0*  --   --   LATICACIDVEN  --  1.6  --   BUN 17 16 9   CREATININE 0.92 1.00 0.72  NA 139 139 138  K 4.1 4.1 4.0   H/o prostate cancer s/p brachytherapy H/o bladder cancer  Mobility: Lives at home, ambulates with a cane.  Encourage ambulation  Goals of care:   Code Status: Limited: Do not attempt resuscitation (DNR) -DNR-LIMITED -Do Not Intubate/DNI     DVT prophylaxis:  enoxaparin  (LOVENOX ) injection 40 mg Start: 07/25/24 1800   Antimicrobials: IV Rocephin , azithromycin  Fluid: None Consultants: Oncology, pulmonology Family Communication: Family not at bedside  Status: Inpatient  Level of care:  Stepdown   Patient is from: Home Anticipated d/c to: Pending clinical course.  Worsening.  On 50 L heated high flow oxygen  today  Diet:  Diet Order             Diet regular Room service appropriate? Yes; Fluid consistency: Thin  Diet effective now                    Scheduled Meds:  Chlorhexidine  Gluconate Cloth  6 each Topical Daily   enoxaparin  (LOVENOX ) injection  40 mg Subcutaneous Q24H   guaiFENesin   600 mg Oral BID   methylPREDNISolone  (SOLU-MEDROL ) injection  60 mg Intravenous Daily   senna  1 tablet Oral BID   sodium chloride  flush  10-40 mL Intracatheter Q12H    PRN meds: acetaminophen  **OR** acetaminophen , bisacodyl , guaiFENesin -dextromethorphan, hydrALAZINE ,  ipratropium-albuterol , mouth rinse, oxyCODONE , polyethylene glycol, sodium chloride  flush   Infusions:   piperacillin -tazobactam (ZOSYN )  IV 3.375 g (07/27/24 1403)     Antimicrobials: Anti-infectives (From admission, onward)    Start     Dose/Rate Route Frequency Ordered Stop   07/27/24 1400  piperacillin -tazobactam (ZOSYN ) IVPB 3.375 g        3.375 g 12.5 mL/hr over 240 Minutes Intravenous Every 8 hours 07/27/24 1308     07/25/24 1330  cefTRIAXone  (ROCEPHIN ) 2 g in sodium chloride  0.9 % 100 mL IVPB        2 g 200 mL/hr over 30 Minutes Intravenous Once 07/25/24 1315 07/25/24 1409   07/25/24 1330  azithromycin  (ZITHROMAX ) tablet 500 mg        500 mg Oral  Once 07/25/24 1315 07/25/24 1416       Objective: Vitals:   07/27/24 1400 07/27/24 1421  BP: (!) 98/55   Pulse: 81   Resp: 14   Temp:  (!) 97.5 F (36.4 C)  SpO2: 93%     Intake/Output Summary (Last 24 hours) at 07/27/2024 1516 Last data filed at 07/27/2024 1350 Gross per 24 hour  Intake 1002.71 ml  Output 600 ml  Net 402.71 ml   There were no vitals filed for this visit. Weight change:  There is no height or weight on file to calculate BMI.   Physical Exam:   General exam: Pleasant, elderly African-American male.  Does not complain of any distress.  On 50 L O2 HHFNC Skin: No rashes, lesions or ulcers. HEENT: Atraumatic, normocephalic, no obvious bleeding Lungs: Diminished air entry in both bases.  Otherwise clear to auscultation.  No crackles or wheezing  CVS: S1, S2, no murmur,   GI/Abd: Soft, nontender, nondistended, bowel sound present,   CNS: Alert, awake, oriented x 3 Psychiatry: Mood appropriate Extremities: No pedal edema, no calf tenderness,   Data Review: I have personally reviewed the laboratory data and studies available.  F/u labs ordered Unresulted Labs (From admission, onward)     Start     Ordered   07/28/24 0500  Procalcitonin  Tomorrow morning,   R       Question:  Specimen collection  method  Answer:  Unit=Unit collect   07/27/24 1234   07/25/24 1504  Legionella Pneumophila Serogp 1 Ur Ag  Once,   R        07/25/24 1504   07/25/24 1502  Expectorated Sputum Assessment w Gram Stain, Rflx to Resp Cult  Once,   R        07/25/24 1503            Signed, Chapman Rota, MD [facility] 07/27/2024    "

## 2024-07-28 DIAGNOSIS — J188 Other pneumonia, unspecified organism: Secondary | ICD-10-CM | POA: Diagnosis not present

## 2024-07-28 LAB — PROCALCITONIN: Procalcitonin: <0.1 ng/mL

## 2024-07-28 NOTE — Consult Note (Addendum)
 "  NAME:  John [name], MRN:  [id number], DOB:  1946/04/20, LOS: 3 ADMISSION DATE:  07/25/2024, CONSULTATION DATE:  07/27/24 REFERRING MD:  Dr. Arlice, CHIEF COMPLAINT:  SOB   History of Present Illness:   36 yoM with PMH significant for former smoker (quit 20-52yrs ago), metastatic small cell lung cancer., prostate cancer 2013 and urothelial carcinoma 2022, left kidney lesion s/p SBRT 04/2024, HTN, aortic regurgitation, type 1 second degree AV block, cataracts   Pt is followed by Dr. Sherrod with oncology for metastatic small cell lung cancer.  Initially dx 07/2023, s/p palliative RT completed 09/19/23, and palliative chemo with carboplatin  + etoposide  + durvalumab , currently maintained on durvalumab , last dose 12/17.  On CXR 12/30 developed bilateral lobe patchy infiltrates.  Started having some exertional SOB.  Was called in rx doxycycline that he started 1/13 and then presented to ER 1/14 due to worsening SOB.  Denies cough but reports several days ago had speck of blood but nothing since.   Denies fever, chills, cough, chest pain, sick contacts, orthopnea, N/V, dysphagia or choking episodes, or LE swelling/ pain.  Lives alone, uses a cane, and reports is otherwise independent in ADLs except for doesn't drive anymore.  Is not on home oxygen  or uses any inhalers/ nebs at baseline.   Found to be hypoxic on admit at 68% on RA, afebrile, WBC 15, PCT 0.11, SARs, flu, RSV neg, MRSA PCR neg.  CTA PE neg for PE but showing centrilobular and paraseptal emphysema with patchy consolidation and GGO bilaterally, moderate partially loculated left pleural effusion, and s/p radiation changes in upper and lower lobes bilaterally, interval enlargement of mediastinal lymph nodes, and enlarged pulmonic trunk indicative of pulmonary arterial hypertension. Admitted to [facility].  S/p empiric azithro and ctx on 1/14.  Oncology following, changed to [facility] and started on steroids started 1/15.  Pulmonary consulted 1/16 given CT findings  and ongoing HHFNC of 60% and 50L.  Pt reports feeling better since admit.  Currently no SOB at rest and continues to deny cough.    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   worked up with cardiology dr hochrein and f/u prn   Nocturia 02/11/2021   Prostate CA (HCC) 2024   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   Wears glasses 02/11/2021    Significant Hospital Events: Including procedures, antibiotic start and stop dates in addition to other pertinent events   07/25/24 Admit 1/15 steroids added, HHFNC 75%-80%, 50L  Interim History / Subjective:  Currently on 60%, 50L  Objective    Blood pressure (!) 92/39, pulse 77, temperature (!) 97.3 F (36.3 C), resp. rate 19, height 6' (1.829 m), weight 63.8 kg, SpO2 95%.    FiO2 (%):  [60 %] 60 %   Intake/Output  Summary (Last 24 hours) at 07/28/2024 1720 Last data filed at 07/28/2024 1247 Gross per 24 hour  Intake 183 ml  Output 850 ml  Net -667 ml   Filed Weights   07/27/24 1822  Weight: 63.8 kg    Examination: General:  pleasant thin elderly male sitting in bed in NAD HEENT: MM pink/moist Neuro: Aox4, MAE CV: rr, NSR, port right chest accessed, appears euvolemic  PULM:  mild resp distress, speaking in full sentences, no wheezing GI: soft,+bs, NT Extremities: warm/dry, no LE edema  Skin:  no rashes   Labs and images personally reviewed  CTA angio 01/14 1. Negative for pulmonary embolus. 2. Multilobar pneumonia. 3. 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 Evaluation for recurrent disease is challenging due to surrounding acute ground-glass and consolidation. 4. Interval enlargement of mediastinal lymph nodes may be reactive in etiology. Recommend attention on follow-up. 5. Partially loculated moderate left pleural effusion. 6. Aortic atherosclerosis (ICD10-I70.0). Coronary artery calcification. 7. Enlarged pulmonic trunk, indicative of pulmonary arterial hypertension.  Resolved problem list   Assessment and Plan   Acute hypoxic respiratory failure Metastatic small cell lung cancer GGO bilateral chemo induced pneumonitis, vs infection. Metastatic small cell lung cancer, s/p palliative chemo/ radiation currently on durvalumab  (since 08/2023), last dose 12/17 Multifocal GGO- ddx include chemo induced pneumonitis vs pneumonia vs lymphangitic spread. Procal, RVP, MRSA, strep negative.  TTE 1/16> EF 60-65%, normal RSVF, no RWMA, trivial MR, mild to mod AR P:  - cont to wean HHFNC for sat goal > 92%.   - cont solumedrol 60mg  daily, 1mg /kg for chemo induced pneumonitis  -Continue zosyn . - ongoing pulm hygiene with IS, flutter, mucolytic, prn duonebs - I will continue to follow up  I had goals of care and patient is DNR/DNI.   Labs   CBC: Recent Labs  Lab 07/25/24 1314 07/25/24 1328 07/26/24 0510  WBC 15.0*  --  13.2*  NEUTROABS 13.3*  --   --   HGB 13.7 13.9 12.2*  HCT 42.3 41.0 37.7*  MCV 96.6  --  96.2  PLT 240  --  211    Basic Metabolic Panel: Recent Labs  Lab 07/25/24 1314 07/25/24 1328 07/26/24 0510  NA 139 139 138  K 4.1 4.1 4.0  CL 104 103 104  CO2 29  --  27  GLUCOSE 197* 195* 94  BUN 17 16 9   CREATININE 0.92 1.00 0.72  CALCIUM 10.3  --  9.6   GFR: Estimated Creatinine Clearance: 67.6 mL/min (by C-G formula based on  SCr of 0.72 mg/dL). Recent Labs  Lab 07/25/24 1314 07/25/24 1328 07/26/24 0510 07/28/24 1521  PROCALCITON  --   --  0.11 <0.10  WBC 15.0*  --  13.2*  --   LATICACIDVEN  --  1.6  --   --     Liver Function Tests: Recent Labs  Lab 07/25/24 1314  AST 27  ALT 11  ALKPHOS 123  BILITOT 0.5  PROT 7.3  ALBUMIN 3.4*   No results for input(s): LIPASE, AMYLASE in the last 168 hours. No results for input(s): AMMONIA in the last 168 hours.  ABG    Component Value Date/Time   PHART 7.47 (H) 07/25/2024 1316   PCO2ART 41 07/25/2024 1316   PO2ART 121 (H) 07/25/2024 1316   HCO3 29.8 (H) 07/25/2024 1316   TCO2 26 07/25/2024 1328   O2SAT 99.1 07/25/2024 1316     Coagulation Profile: Recent Labs  Lab 07/25/24 1314  INR  1.1    Cardiac Enzymes: No results for input(s): CKTOTAL, CKMB, CKMBINDEX, TROPONINI in the last 168 hours.  HbA1C: No results found for: HGBA1C  CBG: No results for input(s): GLUCAP in the last 168 hours.  Review of Systems:   Positive for SOB and exertional SOB otherwise negative.  See HPI  Past Medical History:  He,  has a past medical history of Aortic regurgitation, Arthritis (02/11/2021), Bladder cancer (HCC), Frequency of urination (02/11/2021), History of prostate cancer (2014), Hydrocele, Hypertension (02/11/2021), Mobitz type 1 second degree atrioventricular block (08/11/2018), Nocturia (02/11/2021), Prostate CA (HCC) (2024), Psoriasis (02/11/2021), Small cell lung cancer (HCC) (08/2023), and Wears glasses (02/11/2021).   Surgical History:   Past Surgical History:  Procedure Laterality Date   ABDOMINAL HERNIA REPAIR  1990's   BRONCHIAL BIOPSY  08/08/2023   Procedure: BRONCHIAL BIOPSIES;  Surgeon: Shelah Lamar RAMAN, MD;  Location: [facility] ENDOSCOPY;  Service: Pulmonary;;   BRONCHIAL BRUSHINGS  08/08/2023   Procedure: BRONCHIAL BRUSHINGS;  Surgeon: Shelah Lamar RAMAN, MD;  Location: [facility] ENDOSCOPY;  Service: Pulmonary;;   BRONCHIAL NEEDLE  ASPIRATION BIOPSY  08/08/2023   Procedure: BRONCHIAL NEEDLE ASPIRATION BIOPSIES;  Surgeon: Shelah Lamar RAMAN, MD;  Location: [facility] ENDOSCOPY;  Service: Pulmonary;;   CYSTOSCOPY W/ RETROGRADES N/A 11/03/2015   Procedure: CYSTOSCOPY WITH RETROGRADE PYELOGRAM;  Surgeon: Belvie LITTIE Clara, MD;  Location: [facility];  Service: Urology;  Laterality: N/A;   CYSTOSCOPY W/ RETROGRADES Left 01/09/2016   Procedure: CYSTOSCOPY WITH LEFT RETROGRADE PYELOGRAM;  Surgeon: Belvie LITTIE Clara, MD;  Location: [facility];  Service: Urology;  Laterality: Left;   CYSTOSCOPY W/ RETROGRADES Bilateral 05/28/2016   Procedure: CYSTOSCOPY WITH RETROGRADE PYELOGRAM;  Surgeon: Belvie LITTIE Clara, MD;  Location: [facility];  Service: Urology;  Laterality: Bilateral;   CYSTOSCOPY W/ RETROGRADES Bilateral 02/17/2021   Procedure: CYSTOSCOPY WITH RETROGRADE PYELOGRAM;  Surgeon: Rosalind Zachary NOVAK, MD;  Location: [facility];  Service: Urology;  Laterality: Bilateral;   CYSTOSCOPY W/ URETERAL STENT REMOVAL Left 01/09/2016   Procedure: CYSTOSCOPY WITH LEFT STENT EXCHANGE;  Surgeon: Belvie LITTIE Clara, MD;  Location: [facility];  Service: Urology;  Laterality: Left;   CYSTOSCOPY WITH BIOPSY N/A 05/28/2016   Procedure: CYSTOSCOPY WITH BIOPSY;  Surgeon: Belvie LITTIE Clara, MD;  Location: [facility];  Service: Urology;  Laterality: N/A;   CYSTOSCOPY WITH URETHRAL DILATATION N/A 05/28/2016   Procedure: CYSTOSCOPY WITH URETHRAL DILATATION;  Surgeon: Belvie LITTIE Clara, MD;  Location: [facility];  Service: Urology;  Laterality: N/A;   HEMOSTASIS CONTROL  08/08/2023   Procedure: HEMOSTASIS CONTROL;  Surgeon: Shelah Lamar RAMAN, MD;  Location: [facility] ENDOSCOPY;  Service: Pulmonary;;   IR IMAGING GUIDED PORT INSERTION  09/19/2023   PROSTATE BIOPSY  05/08/12   Adenocarcinoma   RADIOACTIVE SEED IMPLANT  08/01/2012   Procedure: RADIOACTIVE SEED IMPLANT;   Surgeon: Thomasine Oiler, MD;  Location: [facility];  Service: Urology;  Laterality: N/A;  73 seeds implanted no seeds found in bladder   TRANSURETHRAL RESECTION OF BLADDER TUMOR N/A 11/03/2015   Procedure: TRANSURETHRAL RESECTION OF BLADDER TUMOR (TURBT)WITH INSERTION STENT;  Surgeon: Belvie LITTIE Clara, MD;  Location[facility];  Service: Urology;  Laterality: N/A;   TRANSURETHRAL RESECTION OF BLADDER TUMOR N/A 02/17/2021   Procedure: TRANSURETHRAL RESECTION OF BLADDER TUMOR (TURBT);  Surgeon: Rosalind Zachary NOVAK, MD;  Location: [facility];  Service: Urology;  Laterality: N/A;  30 MINS   TRANSURETHRAL  RESECTION OF BLADDER TUMOR WITH GYRUS (TURBT-GYRUS) N/A 01/09/2016   Procedure: TRANSURETHRAL RESECTION OF BLADDER TUMOR WITH GYRUS (TURBT-GYRUS);  Surgeon: Belvie LITTIE Clara, MD;  Location: [facility];  Service: Urology;  Laterality: N/A;   VIDEO BRONCHOSCOPY WITH RADIAL ENDOBRONCHIAL ULTRASOUND  08/08/2023   Procedure: VIDEO BRONCHOSCOPY WITH RADIAL ENDOBRONCHIAL ULTRASOUND;  Surgeon: Shelah Lamar RAMAN, MD;  Location: [facility] ENDOSCOPY;  Service: Pulmonary;;     Social History:   reports that he quit smoking about 9 years ago. His smoking use included cigarettes. He started smoking about 51 years ago. He has a 21 pack-year smoking history. He has never used smokeless tobacco. He reports that he does not currently use drugs. He reports that he does not drink alcohol.   Family History:  His family history includes Aneurysm in his father; Asthma in his mother and sister; Heart failure in his mother; Hyperlipidemia in his mother and sister.   Allergies Allergies[1]   Home Medications  Prior to Admission medications  Medication Sig Start Date End Date Taking? Authorizing Provider  albuterol  (VENTOLIN  HFA) 108 (90 Base) MCG/ACT inhaler Inhale 1-2 puffs into the lungs every 6 (six) hours as needed for wheezing or shortness of breath. 05/12/24  Yes  Yolande Lamar BROCKS, MD  amLODipine (NORVASC) 10 MG tablet Take 10 mg by mouth daily.   Yes [provider]  APIXABAN  (ELIQUIS ) VTE STARTER PACK (10MG  AND 5MG ) Take as directed on package: start with two-5mg  tablets twice daily for 7 days. On day 8, switch to one-5mg  tablet twice daily. Patient not taking: Reported on 07/25/2024 05/12/24   Yolande Lamar BROCKS, MD  lidocaine -prilocaine  (EMLA ) cream Apply 1 Application topically as needed. Patient not taking: Reported on 07/25/2024 08/25/23   Heilingoetter, Cassandra L, PA-C  memantine  (NAMENDA ) 10 MG tablet Take 1 tablet (10 mg total) by mouth 2 (two) times daily. Patient not taking: Reported on 07/25/2024 12/07/23   Lanell Donald Stagger, PA-C  memantine  (NAMENDA ) 5 MG tablet Begin this prescription the first day of brain radiation. Week 1: take one tablet po qam. Week 2: take one tablet qam and qpm. Week 3: take two tablets qam, and one tablet po q pm. Week 4: take two tablets qam and qpm. Fill subsequent prescription q month. Patient not taking: Reported on 07/25/2024 12/07/23   Lanell Donald Stagger, PA-C  prochlorperazine  (COMPAZINE ) 10 MG tablet Take 1 tablet (10 mg total) by mouth every 6 (six) hours as needed. Patient not taking: Reported on 07/25/2024 08/25/23   Heilingoetter, Cassandra L, PA-C  psyllium (METAMUCIL SMOOTH TEXTURE) 58.6 % powder Take 1 packet by mouth 3 (three) times daily. Patient not taking: Reported on 07/25/2024 05/29/24   Kingsley, Victoria K, DO  senna-docusate (SENOKOT-S) 8.6-50 MG tablet Take 1 tablet by mouth daily. Patient not taking: Reported on 07/25/2024 05/29/24   Ellouise Richerd POUR, DO    The patient is critically ill due to Acute hypoxic resp failure.  Critical care was necessary to treat or prevent imminent or life-threatening deterioration. Critical care time was spent by me on the following activities: development of a treatment plan with the patient and/or surrogate as well as nursing, discussions with  consultants, evaluation of the patient's response to treatment, examination of the patient, obtaining a history from the patient or surrogate, ordering and performing treatments and interventions, ordering and review of laboratory studies, ordering and review of radiographic studies, review of telemetry data including pulse oximetry, re-evaluation of patient's condition and participation in multidisciplinary rounds.  I personally spent 35 minutes providing critical care not including any separately billable procedures.   Marny Patch, MD Critical care time: 35 minutes     Marny Patch, MD   [facility] 07/28/2024, 5:20 PM  See Amion for pager If no response to pager , please call [phone] until 7pm After 7:00 pm call Elink  [phone]            [1] No Known Allergies  "

## 2024-07-28 NOTE — Plan of Care (Signed)

## 2024-07-28 NOTE — Progress Notes (Signed)
 " PROGRESS NOTE    John [name]  FMW:[id number] DOB: 04/03/1946 DOA: 07/25/2024 PCP: Practice, [facility]    Brief Narrative:  John [name] is a 79 y.o. 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 Sherrod.  Underwent radiation in the past.  Received last dose of immunotherapy with durvalumab  12/16. 12/30, he had a chest x-ray done which resulted 10 days later on 1/10 to show bilateral pneumonia.  Patient continued to have symptoms at that time but then suddenly started having shortness of breath, cough and decreased exercise tolerance.SABRA  He does not have any supplemental oxygen  at home. 1/11, he called Dr. Jeannett office and was called in a prescription for doxycycline which he started 1/13.   1/14, patient felt significantly short of breath and hence presented to the ED   In the ED, patient is afebrile, heart rate in 100s, blood pressure in 110s, initially very hypoxic at 68% on room air and required 15 L oxygen  by nasal cannula.   VBG with pH 7.47, pCO2 41, O2 sat 99 CBC with WC count 15, lactic acid normal, CMP unremarkable Respiratory virus panel negative   CT angio chest was negative for pulm embolism but  -showed patchy consolidation and ground glass bilaterally suggestive of multifocal pneumonia. -partially loculated moderate left pleural effusion. -Enlarged pulmonic trunk, indicative of pulmonary arterial hypertension.   Blood culture collected Patient was started on IV Rocephin , azithromycin  Admitted to [facility]   Subsequently, patient's oxygen  requirement worsened significantly up to 50 L heated high flow nasal cannula.  Subjective: Patient seen and examined in the morning rounds.  Remains on  high flow oxygen .  Denies any complaints while on high flow oxygen .  Mobility is slight issue with dyspnea.  Denies any chest pain or wheezing.  Pleasant interaction.  Assessment & Plan:   Acute respiratory failure with hypoxia Multifocal infiltrates Presented with progressively worsening shortness of breath.  Did not respond to outpatient antibiotics No fever, WBC count elevated to 15, lactic acid normal, procalcitonin level normal CT scan ruled out PE, showed multifocal infiltrates read as pneumonia Blood cultures, negative so far With a suspicion of pneumonia, patient was given empiric antibiotic coverage with IV Rocephin , azithromycin  (EKG with QTc 427 ms).   1/16, Zosyn  added 1/15, discussed with oncologist Dr. Sherrod.  He recommended prednisone  1 g/kg given at for 7 days. Now on IV Solu-Medrol  60 mg daily. 1/16, his oxygenation has worsened. In the last 48 hours since admission, patient's oxygen  requirement worsened significantly up to 50 L heated high flow nasal cannula.  Pulmonary consulted.  Antibiotics changed to Zosyn . Also continue supportive care with Mucinex  twice daily and as needed Pulmicort, DuoNeb, incentive spirometry, flutter valve.   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 Sherrod.  Underwent radiation in the past.  Received last dose of immunotherapy with durvalumab  12/16. Oncology following   Hypertension H/o moderate aortic regurgitation CT angio showed enlarged pulmonary trunk indicating pulmonary artery hypertension Clinically does not look volume overloaded. Last echo from 2018 had shown moderate AI.  Repeat echo report pending PTA meds- amlodipine 10 mg daily.  Currently on hold.  Continue to  monitor blood pressure. Net IO Since Admission: 836.37 mL [07/27/24 1516]   H/o prostate cancer s/p brachytherapy H/o bladder cancer    DVT prophylaxis:  enoxaparin  (LOVENOX ) injection 40 mg Start: 07/25/24 1800   Code Status: DNR/DNI Family Communication: None at the bedside Disposition Plan: Status is: Inpatient Remains inpatient appropriate because: On very high flow oxygen      Consultants:  Pulmonary Oncology  Procedures:  None  Antimicrobials:  Zosyn      Objective: Vitals:   07/28/24 0500 07/28/24 0600 07/28/24 0700 07/28/24 0734  BP: (!) 111/44 (!) 118/42 (!) 100/45 (!) 100/45  Pulse: 73 66 72   Resp: 20 17 18    Temp:      TempSrc:      SpO2: 97% 95% 93%   Weight:      Height:        Intake/Output Summary (Last 24 hours) at 07/28/2024 0748 Last data filed at 07/28/2024 0700 Gross per 24 hour  Intake 649.72 ml  Output 850 ml  Net -200.28 ml   Filed Weights   07/27/24 1822  Weight: 63.8 kg    Examination:  General: Chronically sick looking gentleman not in any distress.  Able to talk in complete sentences. Cardiovascular: S1-S2 normal.  Slightly tachycardic.  No murmur.  Right chest wall permacath present. Respiratory: Bilateral clear.  No added sounds.  Poor air entry at bases. SpO2: 94 % O2 Flow Rate (L/min): 50 L/min FiO2 (%): 60 %  Gastrointestinal: Soft.  Nontender.  Bowel sound present. Ext: No swelling or edema.  No cyanosis. Neuro: Alert awake and oriented.  No deficits.      Data Reviewed: I have personally reviewed following labs and imaging studies  CBC: Recent Labs  Lab 07/25/24 1314 07/25/24 1328 07/26/24 0510  WBC 15.0*  --  13.2*  NEUTROABS 13.3*  --   --   HGB 13.7 13.9 12.2*  HCT 42.3 41.0 37.7*  MCV 96.6  --  96.2  PLT 240  --  211   Basic Metabolic Panel: Recent Labs  Lab 07/25/24 1314 07/25/24 1328 07/26/24 0510  NA 139 139 138  K 4.1 4.1 4.0  CL 104 103 104  CO2 29  --  27  GLUCOSE 197* 195* 94  BUN 17 16 9   CREATININE 0.92 1.00 0.72  CALCIUM 10.3  --  9.6   GFR: Estimated Creatinine Clearance: 67.6 mL/min (by C-G formula based on SCr of 0.72  mg/dL). Liver Function Tests: Recent Labs  Lab 07/25/24 1314  AST 27  ALT 11  ALKPHOS 123  BILITOT 0.5  PROT 7.3  ALBUMIN 3.4*   No results for input(s): LIPASE, AMYLASE in the last 168 hours. No results for input(s): AMMONIA in the last 168 hours. Coagulation Profile: Recent Labs  Lab 07/25/24 1314  INR 1.1   Cardiac Enzymes: No results for input(s): CKTOTAL, CKMB, CKMBINDEX, TROPONINI in the last 168 hours. BNP (last 3 results) Recent Labs    05/12/24 1726 05/29/24 1826 07/25/24 1314  PROBNP 113.0 113.0 380.0*   HbA1C: No results for input(s): HGBA1C in the last 72 hours. CBG: No results for input(s): GLUCAP in the last 168 hours. Lipid Profile: No results for input(s): CHOL, HDL, LDLCALC, TRIG, CHOLHDL, LDLDIRECT in the last 72 hours. Thyroid  Function Tests: No results for input(s): TSH, T4TOTAL, FREET4, T3FREE, THYROIDAB in the last 72 hours. Anemia Panel: No results for input(s): VITAMINB12, FOLATE, FERRITIN, TIBC, IRON, RETICCTPCT in the last 72 hours. Sepsis Labs: Recent Labs  Lab  07/25/24 1328 07/26/24 0510  PROCALCITON  --  0.11  LATICACIDVEN 1.6  --     Recent Results (from the past 240 hours)  Blood Culture (routine x 2)     Status: None (Preliminary result)   Collection Time: 07/25/24  1:25 PM   Specimen: BLOOD RIGHT FOREARM  Result Value Ref Range Status   Specimen Description   Final    BLOOD RIGHT FOREARM Performed at [facility], [address], [place], [place] [address]    Special Requests   Final    BOTTLES DRAWN AEROBIC AND ANAEROBIC Blood Culture adequate volume Performed at [facility], [address], [place], [place] [address]    Culture   Final    NO GROWTH 3 DAYS Performed at [facility], [address], [place], [place] [address]    Report Status PENDING  Incomplete  Blood Culture (routine x 2)     Status: None (Preliminary result)    Collection Time: 07/25/24  1:33 PM   Specimen: BLOOD  Result Value Ref Range Status   Specimen Description   Final    BLOOD LEFT ANTECUBITAL Performed at [facility], [address] [place], [place] [address]    Special Requests   Final    BOTTLES DRAWN AEROBIC AND ANAEROBIC Blood Culture adequate volume Performed at [facility], [address] [place], [place] [address]    Culture   Final    NO GROWTH 3 DAYS Performed at [facility], [address], [place], [place] [address]    Report Status PENDING  Incomplete  Resp panel by RT-PCR (RSV, Flu A&B, Covid) Anterior Nasal Swab     Status: None   Collection Time: 07/25/24  1:47 PM   Specimen: Anterior Nasal Swab  Result Value Ref Range Status   SARS Coronavirus 2 by RT PCR NEGATIVE NEGATIVE Final    Comment: (NOTE) SARS-CoV-2 target nucleic acids are NOT DETECTED.  The SARS-CoV-2 RNA is generally detectable in upper respiratory specimens during the acute phase of infection. The lowest concentration of SARS-CoV-2 viral copies this assay can detect is 138 copies/mL. A negative result does not preclude SARS-Cov-2 infection and should not be used as the sole basis for treatment or other patient management decisions. A negative result may occur with  improper specimen collection/handling, submission of specimen other than nasopharyngeal swab, presence of viral mutation(s) within the areas targeted by this assay, and inadequate number of viral copies(<138 copies/mL). A negative result must be combined with clinical observations, patient history, and epidemiological information. The expected result is Negative.  Fact Sheet for Patients:  bloggercourse.com  Fact Sheet for Healthcare Providers:  seriousbroker.it  This test is no t yet approved or cleared by the [place]  FDA and  has been authorized for detection and/or diagnosis of  SARS-CoV-2 by FDA under an Emergency Use Authorization (EUA). This EUA will remain  in effect (meaning this test can be used) for the duration of the COVID-19 declaration under Section 564(b)(1) of the Act, 21 U.S.C.section 360bbb-3(b)(1), unless the authorization is terminated  or revoked sooner.       Influenza A by PCR NEGATIVE NEGATIVE Final   Influenza B by PCR NEGATIVE NEGATIVE Final    Comment: (NOTE) The Xpert Xpress SARS-CoV-2/FLU/RSV plus assay is intended as an aid in the diagnosis of influenza from Nasopharyngeal swab specimens and should not be used as a sole basis for treatment. Nasal washings and  aspirates are unacceptable for Xpert Xpress SARS-CoV-2/FLU/RSV testing.  Fact Sheet for Patients: bloggercourse.com  Fact Sheet for Healthcare Providers: seriousbroker.it  This test is not yet approved or cleared by the [place]  FDA and has been authorized for detection and/or diagnosis of SARS-CoV-2 by FDA under an Emergency Use Authorization (EUA). This EUA will remain in effect (meaning this test can be used) for the duration of the COVID-19 declaration under Section 564(b)(1) of the Act, 21 U.S.C. section 360bbb-3(b)(1), unless the authorization is terminated or revoked.     Resp Syncytial Virus by PCR NEGATIVE NEGATIVE Final    Comment: (NOTE) Fact Sheet for Patients: bloggercourse.com  Fact Sheet for Healthcare Providers: seriousbroker.it  This test is not yet approved or cleared by the [place]  FDA and has been authorized for detection and/or diagnosis of SARS-CoV-2 by FDA under an Emergency Use Authorization (EUA). This EUA will remain in effect (meaning this test can be used) for the duration of the COVID-19 declaration under Section 564(b)(1) of the Act, 21 U.S.C. section 360bbb-3(b)(1), unless the authorization is terminated  or revoked.  Performed at [facility], [address], [place], [place] [address]   MRSA Next Gen by PCR, Nasal     Status: None   Collection Time: 07/26/24  2:43 PM   Specimen: Nasal Mucosa; Nasal Swab  Result Value Ref Range Status   MRSA by PCR Next Gen NOT DETECTED NOT DETECTED Final    Comment: (NOTE) The GeneXpert MRSA Assay (FDA approved for NASAL specimens only), is one component of a comprehensive MRSA colonization surveillance program. It is not intended to diagnose MRSA infection nor to guide or monitor treatment for MRSA infections. Test performance is not FDA approved in patients less than 79 years old. Performed at [facility], [address] [place], [place] [address]          Radiology Studies: ECHOCARDIOGRAM COMPLETE Result Date: 07/27/2024    ECHOCARDIOGRAM REPORT   Patient Name:   John [name] Date of Exam: 07/27/2024 Medical Rec #:  [id number]    Height:       72.0 in Accession #:    [id number]   Weight:       150.0 lb Date of Birth:  March 04, 1946    BSA:          1.885 m Patient Age:    78 years     BP:           106/44 mmHg Patient Gender: M            HR:           80 bpm. Exam Location:  Inpatient Procedure: 2D Echo, Cardiac Doppler and Color Doppler (Both Spectral and Color            Flow Doppler were utilized during procedure). Indications:    Aortic regurgitation I35.1  History:        Patient has no prior history of Echocardiogram examinations.                 Risk Factors:Hypertension.  Sonographer:    Tinnie Gosling RDCS Referring Phys: [id number] BINAYA DAHAL IMPRESSIONS  1. Left ventricular ejection fraction, by estimation, is 60 to 65%. The left ventricle has normal function. The left ventricle has no regional wall motion abnormalities. There is mild left ventricular hypertrophy.  2. Right ventricular systolic function is normal. The right ventricular size is normal.  3. Trivial mitral valve regurgitation.  4. The  aortic  valve is tricuspid. Aortic valve regurgitation is mild to moderate. Aortic valve sclerosis/calcification is present, without any evidence of aortic stenosis.  5. The inferior vena cava is normal in size with greater than 50% respiratory variability, suggesting right atrial pressure of 3 mmHg. FINDINGS  Left Ventricle: Left ventricular ejection fraction, by estimation, is 60 to 65%. The left ventricle has normal function. The left ventricle has no regional wall motion abnormalities. The left ventricular internal cavity size was normal in size. There is  mild left ventricular hypertrophy. Right Ventricle: The right ventricular size is normal. Right vetricular wall thickness was not assessed. Right ventricular systolic function is normal. Left Atrium: Left atrial size was normal in size. Right Atrium: Right atrial size was normal in size. Pericardium: There is no evidence of pericardial effusion. Mitral Valve: There is mild thickening of the mitral valve leaflet(s). Mild mitral annular calcification. Trivial mitral valve regurgitation. Tricuspid Valve: The tricuspid valve is normal in structure. Tricuspid valve regurgitation is mild. Aortic Valve: The aortic valve is tricuspid. Aortic valve regurgitation is mild to moderate. Aortic regurgitation PHT measures 611 msec. Aortic valve sclerosis/calcification is present, without any evidence of aortic stenosis. Pulmonic Valve: The pulmonic valve was normal in structure. Pulmonic valve regurgitation is not visualized. Aorta: The aortic root and ascending aorta are structurally normal, with no evidence of dilitation. Venous: The inferior vena cava is normal in size with greater than 50% respiratory variability, suggesting right atrial pressure of 3 mmHg. IAS/Shunts: No atrial level shunt detected by color flow Doppler.  LEFT VENTRICLE PLAX 2D LVIDd:         3.90 cm     Diastology LVIDs:         2.50 cm     LV e' medial:    6.09 cm/s LV PW:         1.20 cm     LV E/e' medial:   7.8 LV IVS:        1.10 cm     LV e' lateral:   7.29 cm/s LVOT diam:     2.30 cm     LV E/e' lateral: 6.5 LV SV:         80 LV SV Index:   42 LVOT Area:     4.15 cm  LV Volumes (MOD) LV vol d, MOD A2C: 82.3 ml LV vol d, MOD A4C: 62.2 ml LV vol s, MOD A2C: 27.3 ml LV vol s, MOD A4C: 31.3 ml LV SV MOD A2C:     55.0 ml LV SV MOD A4C:     62.2 ml LV SV MOD BP:      51.2 ml RIGHT VENTRICLE             IVC RV S prime:     11.10 cm/s  IVC diam: 1.90 cm TAPSE (M-mode): 1.3 cm LEFT ATRIUM             Index        RIGHT ATRIUM           Index LA diam:        2.90 cm 1.54 cm/m   RA Area:     13.10 cm LA Vol (A2C):   20.0 ml 10.61 ml/m  RA Volume:   30.50 ml  16.18 ml/m LA Vol (A4C):   28.9 ml 15.33 ml/m LA Biplane Vol: 25.4 ml 13.47 ml/m  AORTIC VALVE LVOT Vmax:   104.00 cm/s LVOT Vmean:  61.400 cm/s LVOT VTI:  0.192 m AI PHT:      611 msec  AORTA Ao Root diam: 3.40 cm Ao Asc diam:  3.70 cm MITRAL VALVE               TRICUSPID VALVE MV Area (PHT): 3.55 cm    TR Peak grad:   34.1 mmHg MV E velocity: 47.60 cm/s  TR Vmax:        292.00 cm/s MV A velocity: 87.80 cm/s MV E/A ratio:  0.54        SHUNTS                            Systemic VTI:  0.19 m                            Systemic Diam: 2.30 cm Vina Gull MD Electronically signed by Vina Gull MD Signature Date/Time: 07/27/2024/12:21:41 PM    Final         Scheduled Meds:  Chlorhexidine  Gluconate Cloth  6 each Topical Daily   enoxaparin  (LOVENOX ) injection  40 mg Subcutaneous Q24H   feeding supplement  237 mL Oral BID BM   guaiFENesin   600 mg Oral BID   methylPREDNISolone  (SOLU-MEDROL ) injection  60 mg Intravenous Daily   pneumococcal 20-valent conjugate vaccine  0.5 mL Intramuscular Tomorrow-1000   senna  1 tablet Oral BID   sodium chloride  flush  10-40 mL Intracatheter Q12H   Continuous Infusions:  piperacillin -tazobactam (ZOSYN )  IV 12.5 mL/hr at 07/28/24 0700     LOS: 3 days    Time spent: 50 minutes    Renato Applebaum, MD [facility]   "

## 2024-07-28 NOTE — Plan of Care (Signed)
   Problem: Education: Goal: Knowledge of General Education information will improve Description: Including pain rating scale, medication(s)/side effects and non-pharmacologic comfort measures Outcome: Progressing   Problem: Clinical Measurements: Goal: Ability to maintain clinical measurements within normal limits will improve Outcome: Progressing Goal: Respiratory complications will improve Outcome: Progressing Goal: Cardiovascular complication will be avoided Outcome: Progressing   Problem: Nutrition: Goal: Adequate nutrition will be maintained Outcome: Progressing   Problem: Coping: Goal: Level of anxiety will decrease Outcome: Progressing

## 2024-07-29 ENCOUNTER — Inpatient Hospital Stay (HOSPITAL_COMMUNITY)

## 2024-07-29 DIAGNOSIS — J188 Other pneumonia, unspecified organism: Secondary | ICD-10-CM | POA: Diagnosis not present

## 2024-07-29 LAB — BLOOD GAS, ARTERIAL
Acid-Base Excess: 5.6 mmol/L — ABNORMAL HIGH (ref 0.0–2.0)
Bicarbonate: 29.8 mmol/L — ABNORMAL HIGH (ref 20.0–28.0)
FIO2: 80 %
O2 Saturation: 99.1 %
Patient temperature: 37
pCO2 arterial: 41 mmHg (ref 32–48)
pH, Arterial: 7.47 — ABNORMAL HIGH (ref 7.35–7.45)
pO2, Arterial: 121 mmHg — ABNORMAL HIGH (ref 83–108)

## 2024-07-29 NOTE — Plan of Care (Signed)

## 2024-07-29 NOTE — Progress Notes (Signed)
 " PROGRESS NOTE    John [name]  FMW:[id number] DOB: [birth year] DOA: 07/25/2024 PCP: Practice, [facility]    Brief Narrative:  John [name] is a 79 y.o. 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 Sherrod.  Underwent radiation in the past.  Received last dose of immunotherapy with durvalumab  12/16. 12/30, he had a chest x-ray done which resulted 10 days later on 1/10 to show bilateral pneumonia.  Patient continued to have symptoms at that time but then suddenly started having shortness of breath, cough and decreased exercise tolerance.SABRA  He does not have any supplemental oxygen  at home. 1/11, he called Dr. Jeannett office and was called in a prescription for doxycycline which he started 1/13.   1/14, patient felt significantly short of breath and hence presented to the ED   In the ED, patient is afebrile, heart rate in 100s, blood pressure in 110s, initially very hypoxic at 68% on room air and required 15 L oxygen  by nasal cannula.   VBG with pH 7.47, pCO2 41, O2 sat 99 CBC with WC count 15, lactic acid normal, CMP unremarkable Respiratory virus panel negative   CT angio chest was negative for pulm embolism but  -showed patchy consolidation and ground glass bilaterally suggestive of multifocal pneumonia. -partially loculated moderate left pleural effusion. -Enlarged pulmonic trunk, indicative of pulmonary arterial hypertension.   Blood culture collected Patient was started on IV Rocephin , azithromycin  Admitted to [facility]   Subsequently, patient's oxygen  requirement worsened significantly up to 50 L heated high flow nasal cannula.  Subjective:  Patient was seen and examined.  Patient himself tells me  that he is doing well.  Remains on high flow oxygen . No other overnight events. SpO2: 93 % O2 Flow Rate (L/min): 50 L/min FiO2 (%): 60 %   Assessment & Plan:   Acute respiratory failure with hypoxia Multifocal infiltrates Presented with progressively worsening shortness of breath.  Did not respond to outpatient antibiotics No fever, WBC count elevated to 15, lactic acid normal, procalcitonin level normal CT scan ruled out PE, showed multifocal infiltrates read as pneumonia Blood cultures, negative so far With a suspicion of pneumonia, patient was given empiric antibiotic coverage with IV Rocephin , azithromycin  (EKG with QTc 427 ms).   1/16, Zosyn  added 1/15, discussed with oncologist Dr. Sherrod.  He recommended prednisone  1 g/kg given at for 7 days. Now on IV Solu-Medrol  60 mg daily. 1/16, oxygenation worsened.  Transferred to stepdown.  Remains on 50 L heated high flow.  On high-dose IV steroids.  Zosyn  added.  Continue chest pressure therapy.  Mobilize with PT OT today.   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 Sherrod.  Underwent radiation in the past.  Received last dose of immunotherapy with durvalumab  12/16. Oncology following   Hypertension H/o moderate aortic regurgitation CT angio showed enlarged pulmonary trunk indicating pulmonary artery hypertension Clinically does not look volume overloaded. Last echo from 2018 had shown moderate AI.  Repeat echo report pending PTA meds- amlodipine 10 mg daily.  Currently on hold.  Continue to monitor blood pressure.   H/o prostate cancer s/p brachytherapy H/o bladder cancer    DVT  prophylaxis: enoxaparin  (LOVENOX ) injection 40 mg Start: 07/25/24 1800   Code Status: DNR/DNI Family Communication: None at the bedside Disposition Plan: Status is: Inpatient Remains inpatient appropriate because: On heated high flow.  Remains  critically sick.     Consultants:  Pulmonary Oncology  Procedures:  None  Antimicrobials:  Zosyn      Objective: Vitals:   07/29/24 0500 07/29/24 0600 07/29/24 0700 07/29/24 0800  BP: (!) 108/44 (!) 118/46 (!) 111/46 (!) 108/43  Pulse: 75 67 77 68  Resp: (!) 31 (!) 27 (!) 23 20  Temp: 98.2 F (36.8 C)  98.2 F (36.8 C)   TempSrc: Oral  Oral   SpO2: 92% 93% (!) 89% 93%  Weight:      Height:        Intake/Output Summary (Last 24 hours) at 07/29/2024 1042 Last data filed at 07/29/2024 0945 Gross per 24 hour  Intake 185.29 ml  Output 1200 ml  Net -1014.71 ml   Filed Weights   07/27/24 1822  Weight: 63.8 kg    Examination:  General: Mildly anxious.  Looks otherwise comfortable.  Able to talk in complete sentences. Cardiovascular: S1-S2 normal.  Slightly tachycardic while talking.  No murmur.  Right chest wall permacath present. Respiratory: Poor bilateral air entry.  No added sounds. SpO2: 93 % O2 Flow Rate (L/min): 50 L/min FiO2 (%): 60 %  Gastrointestinal: Soft.  Nontender.  Bowel sound present. Ext: No swelling or edema.  No cyanosis. Neuro: Alert awake and oriented.  No deficits.      Data Reviewed: I have personally reviewed following labs and imaging studies  CBC: Recent Labs  Lab 07/25/24 1314 07/25/24 1328 07/26/24 0510  WBC 15.0*  --  13.2*  NEUTROABS 13.3*  --   --   HGB 13.7 13.9 12.2*  HCT 42.3 41.0 37.7*  MCV 96.6  --  96.2  PLT 240  --  211   Basic Metabolic Panel: Recent Labs  Lab 07/25/24 1314 07/25/24 1328 07/26/24 0510  NA 139 139 138  K 4.1 4.1 4.0  CL 104 103 104  CO2 29  --  27  GLUCOSE 197* 195* 94  BUN 17 16 9   CREATININE 0.92 1.00 0.72  CALCIUM 10.3  --  9.6   GFR: Estimated Creatinine Clearance: 67.6 mL/min (by C-G formula based on SCr of 0.72 mg/dL). Liver Function Tests: Recent Labs  Lab 07/25/24 1314  AST 27  ALT 11  ALKPHOS 123  BILITOT 0.5  PROT 7.3  ALBUMIN 3.4*   No results for input(s):  LIPASE, AMYLASE in the last 168 hours. No results for input(s): AMMONIA in the last 168 hours. Coagulation Profile: Recent Labs  Lab 07/25/24 1314  INR 1.1   Cardiac Enzymes: No results for input(s): CKTOTAL, CKMB, CKMBINDEX, TROPONINI in the last 168 hours. BNP (last 3 results) Recent Labs    05/12/24 1726 05/29/24 1826 07/25/24 1314  PROBNP 113.0 113.0 380.0*   HbA1C: No results for input(s): HGBA1C in the last 72 hours. CBG: No results for input(s): GLUCAP in the last 168 hours. Lipid Profile: No results for input(s): CHOL, HDL, LDLCALC, TRIG, CHOLHDL, LDLDIRECT in the last 72 hours. Thyroid  Function Tests: No results for input(s): TSH, T4TOTAL, FREET4, T3FREE, THYROIDAB in the last 72 hours. Anemia Panel: No results for input(s): VITAMINB12, FOLATE, FERRITIN, TIBC, IRON, RETICCTPCT in the last 72 hours. Sepsis Labs: Recent Labs  Lab 07/25/24 1328 07/26/24 0510 07/28/24 1521  PROCALCITON  --  0.11 <0.10  LATICACIDVEN 1.6  --   --  Recent Results (from the past 240 hours)  Blood Culture (routine x 2)     Status: None (Preliminary result)   Collection Time: 07/25/24  1:25 PM   Specimen: BLOOD RIGHT FOREARM  Result Value Ref Range Status   Specimen Description   Final    BLOOD RIGHT FOREARM Performed at [facility], [address] [place], [place] [address]    Special Requests   Final    BOTTLES DRAWN AEROBIC AND ANAEROBIC Blood Culture adequate volume Performed at [facility], [address] [place], [place] [address]    Culture   Final    NO GROWTH 4 DAYS Performed at [facility], [address] [place], [place] [address]    Report Status PENDING  Incomplete  Blood Culture (routine x 2)     Status: None (Preliminary result)   Collection Time: 07/25/24  1:33 PM   Specimen: BLOOD  Result Value Ref Range Status   Specimen Description   Final    BLOOD LEFT  ANTECUBITAL Performed at [facility], [address], [place], [place] [address]    Special Requests   Final    BOTTLES DRAWN AEROBIC AND ANAEROBIC Blood Culture adequate volume Performed at [facility], [address], [place], [place] [address]    Culture   Final    NO GROWTH 4 DAYS Performed at [facility], [address], [place], [place] [address]    Report Status PENDING  Incomplete  Resp panel by RT-PCR (RSV, Flu A&B, Covid) Anterior Nasal Swab     Status: None   Collection Time: 07/25/24  1:47 PM   Specimen: Anterior Nasal Swab  Result Value Ref Range Status   SARS Coronavirus 2 by RT PCR NEGATIVE NEGATIVE Final    Comment: (NOTE) SARS-CoV-2 target nucleic acids are NOT DETECTED.  The SARS-CoV-2 RNA is generally detectable in upper respiratory specimens during the acute phase of infection. The lowest concentration of SARS-CoV-2 viral copies this assay can detect is 138 copies/mL. A negative result does not preclude SARS-Cov-2 infection and should not be used as the sole basis for treatment or other patient management decisions. A negative result may occur with  improper specimen collection/handling, submission of specimen other than nasopharyngeal swab, presence of viral mutation(s) within the areas targeted by this assay, and inadequate number of viral copies(<138 copies/mL). A negative result must be combined with clinical observations, patient history, and epidemiological information. The expected result is Negative.  Fact Sheet for Patients:  bloggercourse.com  Fact Sheet for Healthcare Providers:  seriousbroker.it  This test is no t yet approved or cleared by the [place]  FDA and  has been authorized for detection and/or diagnosis of SARS-CoV-2 by FDA under an Emergency Use Authorization (EUA). This EUA will remain  in effect (meaning this test can be used) for  the duration of the COVID-19 declaration under Section 564(b)(1) of the Act, 21 U.S.C.section 360bbb-3(b)(1), unless the authorization is terminated  or revoked sooner.       Influenza A by PCR NEGATIVE NEGATIVE Final   Influenza B by PCR NEGATIVE NEGATIVE Final    Comment: (NOTE) The Xpert Xpress SARS-CoV-2/FLU/RSV plus assay is intended as an aid in the diagnosis of influenza from Nasopharyngeal swab specimens and should not be used as a sole basis for treatment. Nasal washings and aspirates are unacceptable for Xpert Xpress SARS-CoV-2/FLU/RSV testing.  Fact Sheet for Patients: bloggercourse.com  Fact Sheet for Healthcare  Providers: seriousbroker.it  This test is not yet approved or cleared by the [place]  FDA and has been authorized for detection and/or diagnosis of SARS-CoV-2 by FDA under an Emergency Use Authorization (EUA). This EUA will remain in effect (meaning this test can be used) for the duration of the COVID-19 declaration under Section 564(b)(1) of the Act, 21 U.S.C. section 360bbb-3(b)(1), unless the authorization is terminated or revoked.     Resp Syncytial Virus by PCR NEGATIVE NEGATIVE Final    Comment: (NOTE) Fact Sheet for Patients: bloggercourse.com  Fact Sheet for Healthcare Providers: seriousbroker.it  This test is not yet approved or cleared by the [place]  FDA and has been authorized for detection and/or diagnosis of SARS-CoV-2 by FDA under an Emergency Use Authorization (EUA). This EUA will remain in effect (meaning this test can be used) for the duration of the COVID-19 declaration under Section 564(b)(1) of the Act, 21 U.S.C. section 360bbb-3(b)(1), unless the authorization is terminated or revoked.  Performed at [facility], [address] [place], [place] [address]   MRSA Next Gen by PCR, Nasal     Status: None    Collection Time: 07/26/24  2:43 PM   Specimen: Nasal Mucosa; Nasal Swab  Result Value Ref Range Status   MRSA by PCR Next Gen NOT DETECTED NOT DETECTED Final    Comment: (NOTE) The GeneXpert MRSA Assay (FDA approved for NASAL specimens only), is one component of a comprehensive MRSA colonization surveillance program. It is not intended to diagnose MRSA infection nor to guide or monitor treatment for MRSA infections. Test performance is not FDA approved in patients less than 33 years old. Performed at [facility], [address] [place], [place] [address]          Radiology Studies: No results found.       Scheduled Meds:  Chlorhexidine  Gluconate Cloth  6 each Topical Daily   enoxaparin  (LOVENOX ) injection  40 mg Subcutaneous Q24H   feeding supplement  237 mL Oral BID BM   guaiFENesin   600 mg Oral BID   methylPREDNISolone  (SOLU-MEDROL ) injection  60 mg Intravenous Daily   pneumococcal 20-valent conjugate vaccine  0.5 mL Intramuscular Tomorrow-1000   senna  1 tablet Oral BID   sodium chloride  flush  10-40 mL Intracatheter Q12H   Continuous Infusions:  piperacillin -tazobactam (ZOSYN )  IV Stopped (07/29/24 0943)     LOS: 4 days    Time spent: 50 minutes    Renato Applebaum, MD [facility]   "

## 2024-07-29 NOTE — Evaluation (Signed)
 Physical Therapy Evaluation Patient Details Name: John [name] MRN: [id number] DOB: [birth year] Today's Date: 07/29/2024  History of Present Illness  Mr. Goodley is a 79 yr old male admitted to the hospital with shortness of breath. He was found to have acute respiratory failure with hypoxia and multi-focal PNA. PMH: current small cell lung cancer s/p brachytherapy, bladder CA, HTN, aortic regurgitation, arthritis, psoriasis  Clinical Impression  Pt admitted as above and presenting with functional mobility limitations 2* generalized weakness, decreased activity tolerance, and balance deficits.  This am, pt limited to OOB and step pvt to recliner with O2 sats dropping to 83% while on HHFNC.  Patient will benefit from continued inpatient follow up therapy, <3 hours/day       If plan is discharge home, recommend the following: A lot of help with walking and/or transfers;A little help with bathing/dressing/bathroom;Assistance with cooking/housework;Assist for transportation;Help with stairs or ramp for entrance   Can travel by private vehicle   Yes    Equipment Recommendations Rolling walker (2 wheels)  Recommendations for Other Services       Functional Status Assessment Patient has had a recent decline in their functional status and demonstrates the ability to make significant improvements in function in a reasonable and predictable amount of time.     Precautions / Restrictions Precautions Precautions: Fall Recall of Precautions/Restrictions: Intact Restrictions Weight Bearing Restrictions Per Provider Order: No      Mobility  Bed Mobility Overal bed mobility: Needs Assistance Bed Mobility: Supine to Sit     Supine to sit: Supervision, Used rails, HOB elevated          Transfers Overall transfer level: Needs assistance Equipment used: Rolling walker (2 wheels) Transfers: Sit to/from Stand, Bed to chair/wheelchair/BSC Sit to Stand: Min assist   Step pivot transfers: Min  assist       General transfer comment: Cues for use of UEs to self assist.  Physical assist to bring wt up and fwd and to balance in standing with RW.  Step pvt bed to recliner with RW    Ambulation/Gait               General Gait Details: bed to recliner only 2* O2 desat to 83%  Stairs            Wheelchair Mobility     Tilt Bed    Modified Rankin (Stroke Patients Only)       Balance Overall balance assessment: Needs assistance Sitting-balance support: No upper extremity supported, Feet supported Sitting balance-Leahy Scale: Good     Standing balance support: Bilateral upper extremity supported Standing balance-Leahy Scale: Poor Standing balance comment: Min assist with RW                             Pertinent Vitals/Pain Pain Assessment Pain Assessment: No/denies pain    Home Living Family/patient expects to be discharged to:: Unsure Living Arrangements: Alone   Type of Home: Apartment Home Access: Stairs to enter Entrance Stairs-Rails: Left Entrance Stairs-Number of Steps: 2-3   Home Layout: One level Home Equipment: Cane - single point Additional Comments: His sisters are supportive and live nearby. They check in on him regularly.    Prior Function Prior Level of Function : Independent/Modified Independent             Mobility Comments: Has been using a cane outside the home for the past approximately month and a half. ADLs Comments:  Independent with ADLs, cooking, cleaning. He does not drive.     Extremity/Trunk Assessment   Upper Extremity Assessment Upper Extremity Assessment: Defer to OT evaluation RUE Deficits / Details: Significant shoulder AROM limitations for AROM for shoulder flexion being <90 degrees; pt reported this is due to a rotator cuff injury. Elbow and hand AROM WFL. Grip strength 4+/5 LUE Deficits / Details: AROM WFL. Grip strength 4+/5    Lower Extremity Assessment Lower Extremity Assessment:  Generalized weakness RLE Deficits / Details: AROM WFL LLE Deficits / Details: AROM WFL    Cervical / Trunk Assessment Cervical / Trunk Assessment: Normal  Communication   Communication Communication: No apparent difficulties    Cognition Arousal: Alert Behavior During Therapy: WFL for tasks assessed/performed                             Following commands: Intact       Cueing Cueing Techniques: Verbal cues     General Comments      Exercises     Assessment/Plan    PT Assessment Patient needs continued PT services  PT Problem List Decreased strength;Decreased range of motion;Decreased activity tolerance;Decreased balance;Decreased mobility;Decreased knowledge of use of DME       PT Treatment Interventions DME instruction;Gait training;Stair training;Functional mobility training;Therapeutic activities;Therapeutic exercise;Patient/family education    PT Goals (Current goals can be found in the Care Plan section)  Acute Rehab PT Goals Patient Stated Goal: Regain IND PT Goal Formulation: With patient Time For Goal Achievement: 08/12/24 Potential to Achieve Goals: Fair    Frequency Min 3X/week     Co-evaluation PT/OT/SLP Co-Evaluation/Treatment: Yes Reason for Co-Treatment: To address functional/ADL transfers PT goals addressed during session: Mobility/safety with mobility OT goals addressed during session: ADL's and self-care       AM-PAC PT 6 Clicks Mobility  Outcome Measure Help needed turning from your back to your side while in a flat bed without using bedrails?: None Help needed moving from lying on your back to sitting on the side of a flat bed without using bedrails?: A Little Help needed moving to and from a bed to a chair (including a wheelchair)?: A Little Help needed standing up from a chair using your arms (e.g., wheelchair or bedside chair)?: A Little Help needed to walk in hospital room?: Total Help needed climbing 3-5 steps with a  railing? : Total 6 Click Score: 15    End of Session Equipment Utilized During Treatment: Gait belt Activity Tolerance: Patient tolerated treatment well;Other (comment) (desat) Patient left: in chair;with call bell/phone within reach;with chair alarm set Nurse Communication: Mobility status PT Visit Diagnosis: Difficulty in walking, not elsewhere classified (R26.2)    Time: 8971-8898 PT Time Calculation (min) (ACUTE ONLY): 33 min   Charges:   PT Evaluation $PT Eval Low Complexity: 1 Low   PT General Charges $$ ACUTE PT VISIT: 1 Visit         [facility] PT Acute Rehabilitation Services Office [phone]   Cleona Doubleday 07/29/2024, 1:23 PM

## 2024-07-29 NOTE — Plan of Care (Signed)
  Problem: Education: Goal: Knowledge of General Education information will improve Description: Including pain rating scale, medication(s)/side effects and non-pharmacologic comfort measures Outcome: Progressing   Problem: Health Behavior/Discharge Planning: Goal: Ability to manage health-related needs will improve Outcome: Progressing   Problem: Nutrition: Goal: Adequate nutrition will be maintained Outcome: Progressing   Problem: Pain Managment: Goal: General experience of comfort will improve and/or be controlled Outcome: Progressing

## 2024-07-29 NOTE — Evaluation (Signed)
 Occupational Therapy Evaluation Patient Details Name: John [name] MRN: [id number] DOB: [birth year] Today's Date: 07/29/2024   History of Present Illness   John [name] is a 79 yr old male admitted to the hospital with shortness of breath. He was found to have acute respiratory failure with hypoxia and multi-focal PNA. PMH: current small cell lung cancer s/p brachytherapy, bladder CA, HTN, aortic regurgitation, arthritis, psoriasis     Clinical Impressions The pt is currently presenting below his baseline level of functioning for self-care management. He is limited by the below listed deficits (see OT problem list). As such, his occupational performance is compromised and he is requiring assistance for self-care management. During the session today, he required min assist for lower body dressing in sitting, as well as to stand using a RW & to step to the bedside chair using a RW. He was further noted to be with general deconditioning, the need for increased supplemental O2 (pt currently on HHFNC), generalized weakness, unsteadiness in standing, and compromised endurance. His O2 saturation decreased to 83% on HHFNC after performing supine to sit. He required intermittent therapeutic rest breaks, instruction on implementing pursed lip breathing exercises and moderate time for O2 saturation recovery to at least 90% after light activity. He will benefit from further OT services to maximize his independence with ADLs and to decrease the risk for further weakness and deconditioning. Patient will benefit from continued inpatient follow up therapy, <3 hours/day.      If plan is discharge home, recommend the following:   Assistance with cooking/housework;Assist for transportation;A little help with walking and/or transfers;A lot of help with walking and/or transfers     Functional Status Assessment   Patient has had a recent decline in their functional status and demonstrates the ability to make  significant improvements in function in a reasonable and predictable amount of time.     Equipment Recommendations   BSC/3in1;Tub/shower bench     Recommendations for Other Services         Precautions/Restrictions   Precautions Precautions: Fall Restrictions Weight Bearing Restrictions Per Provider Order: No     Mobility Bed Mobility Overal bed mobility: Needs Assistance Bed Mobility: Supine to Sit     Supine to sit: Supervision, Used rails, HOB elevated          Transfers Overall transfer level: Needs assistance Equipment used: Rolling walker (2 wheels) Transfers: Sit to/from Stand, Bed to chair/wheelchair/BSC Sit to Stand: Min assist     Step pivot transfers: Min assist            Balance       Sitting balance - Comments: static sitting-good. dynamic sitting-fair+       Standing balance comment: Min assist with RW          ADL either performed or assessed with clinical judgement   ADL Overall ADL's : Needs assistance/impaired Eating/Feeding: Independent;Sitting Eating/Feeding Details (indicate cue type and reason): chair level Grooming: Set up;Supervision/safety;Sitting Grooming Details (indicate cue type and reason): chair level Upper Body Bathing: Supervision/ safety;Set up;Sitting Upper Body Bathing Details (indicate cue type and reason): chair level Lower Body Bathing: Minimal assistance;Sitting/lateral leans   Upper Body Dressing : Set up;Supervision/safety;Sitting   Lower Body Dressing: Minimal assistance;Sitting/lateral leans   Toilet Transfer: Minimal assistance;BSC/3in1;Rolling walker (2 wheels);Stand-pivot   Toileting- Clothing Manipulation and Hygiene: Minimal assistance;Moderate assistance;Sit to/from stand Toileting - Clothing Manipulation Details (indicate cue type and reason): at bedside commode level, based on clinical judgement  Pertinent Vitals/Pain Pain Assessment Pain Assessment: No/denies  pain     Extremity/Trunk Assessment Upper Extremity Assessment Upper Extremity Assessment: Right hand dominant;RUE deficits/detail;LUE deficits/detail RUE Deficits / Details: Significant shoulder AROM limitations for AROM for shoulder flexion being <90 degrees; pt reported this is due to a rotator cuff injury. Elbow and hand AROM WFL. Grip strength 4+/5 LUE Deficits / Details: AROM WFL. Grip strength 4+/5   Lower Extremity Assessment Lower Extremity Assessment: RLE deficits/detail;LLE deficits/detail;Generalized weakness RLE Deficits / Details: AROM WFL LLE Deficits / Details: AROM WFL       Communication Communication Communication: No apparent difficulties   Cognition Arousal: Alert Behavior During Therapy: WFL for tasks assessed/performed Cognition: No apparent impairments             OT - Cognition Comments: Oriented x4                 Following commands: Intact       Cueing  General Comments   Cueing Techniques: Verbal cues              Home Living   Living Arrangements: Alone   Type of Home: Apartment Home Access: Stairs to enter Entrance Stairs-Number of Steps: 2-3 Entrance Stairs-Rails: Left Home Layout: One level     Bathroom Shower/Tub: Tub/shower unit         Home Equipment: Cane - single point   Additional Comments: His sisters are supportive and live nearby. They check in on him regularly.      Prior Functioning/Environment Prior Level of Function : Independent/Modified Independent             Mobility Comments: Has been using a cane outside the home for the past approximately month and a half. ADLs Comments: Independent with ADLs, cooking, cleaning. He does not drive.    OT Problem List: Decreased strength;Decreased activity tolerance;Impaired balance (sitting and/or standing);Decreased knowledge of use of DME or AE;Cardiopulmonary status limiting activity   OT Treatment/Interventions: Self-care/ADL  training;Therapeutic exercise;Energy conservation;DME and/or AE instruction;Therapeutic activities;Patient/family education;Balance training      OT Goals(Current goals can be found in the care plan section)   Acute Rehab OT Goals OT Goal Formulation: With patient Time For Goal Achievement: 08/12/24 Potential to Achieve Goals: Good ADL Goals Pt Will Perform Grooming: with set-up;with supervision;standing Pt Will Perform Lower Body Dressing: with set-up;with supervision;sit to/from stand Pt Will Transfer to Toilet: with set-up;with supervision;ambulating;grab bars Pt Will Perform Toileting - Clothing Manipulation and hygiene: with set-up;with supervision   OT Frequency:  Min 2X/week       AM-PAC OT 6 Clicks Daily Activity     Outcome Measure Help from another person eating meals?: None Help from another person taking care of personal grooming?: A Little Help from another person toileting, which includes using toliet, bedpan, or urinal?: A Lot Help from another person bathing (including washing, rinsing, drying)?: A Lot Help from another person to put on and taking off regular upper body clothing?: A Little Help from another person to put on and taking off regular lower body clothing?: A Little 6 Click Score: 17   End of Session Equipment Utilized During Treatment: Oxygen ;Rolling walker (2 wheels);Gait belt Nurse Communication: Mobility status  Activity Tolerance: Other (comment) (Fair+ tolerance) Patient left: in chair;with call bell/phone within reach  OT Visit Diagnosis: Muscle weakness (generalized) (M62.81);Unsteadiness on feet (R26.81);Other abnormalities of gait and mobility (R26.89)                Time: 8971-8898 OT  Time Calculation (min): 33 min Charges:  OT General Charges $OT Visit: 1 Visit OT Evaluation $OT Eval Moderate Complexity: 1 Mod    Romelle Muldoon J Harris, OTR/L 07/29/2024, 1:02 PM

## 2024-07-30 DIAGNOSIS — J188 Other pneumonia, unspecified organism: Secondary | ICD-10-CM | POA: Diagnosis not present

## 2024-07-30 LAB — CBC WITH DIFFERENTIAL/PLATELET
Abs Immature Granulocytes: 0.2 K/uL — ABNORMAL HIGH (ref 0.00–0.07)
Basophils Absolute: 0 K/uL (ref 0.0–0.1)
Basophils Relative: 0 %
Eosinophils Absolute: 0.1 K/uL (ref 0.0–0.5)
Eosinophils Relative: 0 %
HCT: 35.5 % — ABNORMAL LOW (ref 39.0–52.0)
Hemoglobin: 11.6 g/dL — ABNORMAL LOW (ref 13.0–17.0)
Immature Granulocytes: 1 %
Lymphocytes Relative: 3 %
Lymphs Abs: 0.5 K/uL — ABNORMAL LOW (ref 0.7–4.0)
MCH: 31.4 pg (ref 26.0–34.0)
MCHC: 32.7 g/dL (ref 30.0–36.0)
MCV: 95.9 fL (ref 80.0–100.0)
Monocytes Absolute: 1.5 K/uL — ABNORMAL HIGH (ref 0.1–1.0)
Monocytes Relative: 9 %
Neutro Abs: 15 K/uL — ABNORMAL HIGH (ref 1.7–7.7)
Neutrophils Relative %: 87 %
Platelets: 189 K/uL (ref 150–400)
RBC: 3.7 MIL/uL — ABNORMAL LOW (ref 4.22–5.81)
RDW: 12.5 % (ref 11.5–15.5)
WBC: 17.3 K/uL — ABNORMAL HIGH (ref 4.0–10.5)
nRBC: 0 % (ref 0.0–0.2)

## 2024-07-30 LAB — CULTURE, BLOOD (ROUTINE X 2)
Culture: NO GROWTH
Culture: NO GROWTH
Special Requests: ADEQUATE
Special Requests: ADEQUATE

## 2024-07-30 LAB — BASIC METABOLIC PANEL WITH GFR
Anion gap: 5 (ref 5–15)
BUN: 19 mg/dL (ref 8–23)
CO2: 32 mmol/L (ref 22–32)
Calcium: 9.8 mg/dL (ref 8.9–10.3)
Chloride: 102 mmol/L (ref 98–111)
Creatinine, Ser: 0.9 mg/dL (ref 0.61–1.24)
GFR, Estimated: >60 mL/min
Glucose, Bld: 90 mg/dL (ref 70–99)
Potassium: 4.3 mmol/L (ref 3.5–5.1)
Sodium: 139 mmol/L (ref 135–145)

## 2024-07-30 LAB — MAGNESIUM: Magnesium: 2.4 mg/dL (ref 1.7–2.4)

## 2024-07-30 MED ORDER — MELATONIN 3 MG PO TABS
3.0000 mg | ORAL_TABLET | Freq: Every evening | ORAL | Status: AC | PRN
  Administered 2024-07-30 – 2024-08-08 (×4): 3 mg via ORAL
  Filled 2024-07-30 (×4): qty 1

## 2024-07-30 NOTE — Progress Notes (Signed)
 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 Sherrod with oncology for metastatic small cell lung cancer.  Initially dx 07/2023, s/p palliative RT completed 09/19/23, and palliative chemo with carboplatin  + etoposide  + durvalumab , currently maintained on durvalumab , last dose 12/17.  On CXR 12/30 developed bilateral lobe patchy infiltrates.  Started having some exertional SOB.  Was called in rx doxycycline that he started 1/13 and then presented to ER 1/14 due to worsening SOB.  Denies cough but reports several days ago had speck of blood but nothing since.   Denies fever, chills, cough, chest pain, sick contacts, orthopnea, N/V, dysphagia or choking episodes, or LE swelling/ pain.  Lives alone, uses a cane, and reports is otherwise independent in ADLs except for doesn't drive anymore.  Is not on home oxygen  or uses any inhalers/ nebs at baseline.    Found to be hypoxic on admit at 68% on RA, afebrile, WBC 15, PCT 0.11, SARs, flu, RSV neg, MRSA PCR neg.  CTA PE neg for PE but showing centrilobular and paraseptal emphysema with patchy consolidation and GGO bilaterally, moderate partially loculated left pleural effusion, and s/p radiation changes in upper and lower lobes bilaterally, interval enlargement of mediastinal lymph nodes, and enlarged pulmonic trunk indicative of pulmonary arterial hypertension. Admitted to [facility].  S/p empiric azithro and ctx on 1/14.  Oncology following, changed to [facility] and started on steroids started 1/15.  Pulmonary consulted 1/16 given CT findings and ongoing HHFNC of 60% and 50L.  Pt reports feeling better since admit.   Currently no SOB at rest and continues to deny cough.      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   worked up with cardiology dr hochrein and f/u prn   Nocturia 02/11/2021   Prostate CA (HCC) 2024   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   Wears glasses 02/11/2021     Significant Hospital Events: Including procedures, antibiotic start and stop dates in addition to other pertinent events     Interim History / Subjective:  07/25/24 Admit 1/15 steroids added, HHFNC 75%-80%, 50L 1/19 HF 45>60%  Objective    Blood pressure (!) 107/41, pulse 72, temperature 97.6 F (36.4 C), temperature source Axillary, resp. rate (!) 22, height 6' (1.829 m), weight 63.8 kg, SpO2 91%.    FiO2 (%):  [45 %-80 %] 45 %   Intake/Output Summary (Last 24 hours) at 07/30/2024  9062 Last data filed at 07/30/2024 0658 Gross per 24 hour  Intake 153.21 ml  Output 700 ml  Net -546.79 ml   Filed Weights   07/27/24 1822  Weight: 63.8 kg    Examination: Physical Exam General:  pleasant thin elderly male sitting in bed in NAD HEENT: MM pink/moist Neuro: Aox4, MAE CV: rr, NSR, port right chest accessed, appears euvolemic  PULM:  mild resp distress, speaking in full sentences, no wheezing GI: soft,+bs, NT Extremities: warm/dry, no LE edema  Skin: no rashes   CTA  angio 01/14 1. Negative for pulmonary embolus. 2. Multilobar pneumonia. 3. 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 Evaluation for recurrent disease is challenging due to surrounding acute ground-glass and consolidation. 4. Interval enlargement of mediastinal lymph nodes may be reactive in etiology. Recommend attention on follow-up. 5. Partially loculated moderate left pleural effusion. 6. Aortic atherosclerosis (ICD10-I70.0). Coronary artery calcification. 7. Enlarged pulmonic trunk, indicative of pulmonary arterial hypertension.  Resolved problem list   Assessment and Plan  Acute hypoxic respiratory failure Metastatic small cell lung cancer GGO bilateral chemo induced pneumonitis, vs infection. Metastatic small cell lung cancer, s/p palliative chemo/ radiation currently on durvalumab  (since 08/2023), last dose 12/17 Multifocal GGO- ddx include chemo induced pneumonitis vs pneumonia vs lymphangitic spread. Procal, RVP, MRSA, strep negative.  TTE 1/16> EF 60-65%, normal RSVF, no RWMA, trivial MR, mild to mod AR P:  - cont to wean HHFNC for sat goal > 92%.   - cont solumedrol 60mg  daily, 1mg /kg for chemo induced pneumonitis  -Continue zosyn . - ongoing pulm hygiene with IS, flutter, mucolytic, prn duonebs - I will continue to follow up   I had goals of care and patient is DNR/DNI.     Labs   CBC: Recent Labs  Lab 07/25/24 1314 07/25/24 1328 07/26/24 0510 07/30/24 0619  WBC 15.0*  --  13.2* 17.3*  NEUTROABS 13.3*  --   --  15.0*  HGB 13.7 13.9 12.2* 11.6*  HCT 42.3 41.0 37.7* 35.5*  MCV 96.6  --  96.2 95.9  PLT 240  --  211 189    Basic Metabolic Panel: Recent Labs  Lab 07/25/24 1314 07/25/24 1328 07/26/24 0510 07/30/24 0619  NA 139 139 138 139  K 4.1 4.1 4.0 4.3  CL 104 103 104 102  CO2 29  --  27 32  GLUCOSE 197* 195* 94 90  BUN 17 16 9 19   CREATININE 0.92 1.00 0.72 0.90  CALCIUM 10.3  --  9.6 9.8  MG  --   --   --  2.4    GFR: Estimated Creatinine Clearance: 60.1 mL/min (by C-G formula based on SCr of 0.9 mg/dL). Recent Labs  Lab 07/25/24 1314 07/25/24 1328 07/26/24 0510 07/28/24 1521 07/30/24 0619  PROCALCITON  --   --  0.11 <0.10  --   WBC 15.0*  --  13.2*  --  17.3*  LATICACIDVEN  --  1.6  --   --   --     Liver Function Tests: Recent Labs  Lab 07/25/24 1314  AST 27  ALT 11  ALKPHOS 123  BILITOT 0.5  PROT 7.3  ALBUMIN 3.4*   No results for input(s): LIPASE, AMYLASE in the last 168 hours. No results for input(s): AMMONIA in the last 168 hours.  ABG    Component Value Date/Time   PHART 7.47 (H) 07/25/2024 1316   PCO2ART 41 07/25/2024 1316   PO2ART 121 (H) 07/25/2024 1316  HCO3 29.8 (H) 07/25/2024 1316   TCO2 26 07/25/2024 1328   O2SAT 99.1 07/25/2024 1316     Coagulation Profile: Recent Labs  Lab 07/25/24 1314  INR 1.1    Cardiac Enzymes: No results for input(s): CKTOTAL, CKMB, CKMBINDEX, TROPONINI in the last 168 hours.  HbA1C: No results found for: HGBA1C  CBG: No results for input(s): GLUCAP in the last 168 hours.  Review of Systems:   As above   Past Medical History:  He,  has a past medical history of Aortic regurgitation, Arthritis (02/11/2021), Bladder cancer (HCC), Frequency of urination (02/11/2021), History of prostate cancer (2014), Hydrocele, Hypertension (02/11/2021), Mobitz type 1 second degree atrioventricular block (08/11/2018), Nocturia (02/11/2021), Prostate CA (HCC) (2024), Psoriasis (02/11/2021), Small cell lung cancer (HCC) (08/2023), and Wears glasses (02/11/2021).   Surgical History:   Past Surgical History:  Procedure Laterality Date   ABDOMINAL HERNIA REPAIR  1990's   BRONCHIAL BIOPSY  08/08/2023   Procedure: BRONCHIAL BIOPSIES;  Surgeon: Shelah Lamar RAMAN, MD;  Location: [facility] ENDOSCOPY;  Service: Pulmonary;;   BRONCHIAL BRUSHINGS  08/08/2023   Procedure: BRONCHIAL BRUSHINGS;  Surgeon: Shelah Lamar RAMAN, MD;  Location: [facility]  ENDOSCOPY;  Service: Pulmonary;;   BRONCHIAL NEEDLE ASPIRATION BIOPSY  08/08/2023   Procedure: BRONCHIAL NEEDLE ASPIRATION BIOPSIES;  Surgeon: Shelah Lamar RAMAN, MD;  Location: [facility] ENDOSCOPY;  Service: Pulmonary;;   CYSTOSCOPY W/ RETROGRADES N/A 11/03/2015   Procedure: CYSTOSCOPY WITH RETROGRADE PYELOGRAM;  Surgeon: Belvie LITTIE Clara, MD;  Location: [facility];  Service: Urology;  Laterality: N/A;   CYSTOSCOPY W/ RETROGRADES Left 01/09/2016   Procedure: CYSTOSCOPY WITH LEFT RETROGRADE PYELOGRAM;  Surgeon: Belvie LITTIE Clara, MD;  Location[facility];  Service: Urology;  Laterality: Left;   CYSTOSCOPY W/ RETROGRADES Bilateral 05/28/2016   Procedure: CYSTOSCOPY WITH RETROGRADE PYELOGRAM;  Surgeon: Belvie LITTIE Clara, MD;  Location: [facility];  Service: Urology;  Laterality: Bilateral;   CYSTOSCOPY W/ RETROGRADES Bilateral 02/17/2021   Procedure: CYSTOSCOPY WITH RETROGRADE PYELOGRAM;  Surgeon: Rosalind Zachary NOVAK, MD;  Location: [facility];  Service: Urology;  Laterality: Bilateral;   CYSTOSCOPY W/ URETERAL STENT REMOVAL Left 01/09/2016   Procedure: CYSTOSCOPY WITH LEFT STENT EXCHANGE;  Surgeon: Belvie LITTIE Clara, MD;  Location: [facility];  Service: Urology;  Laterality: Left;   CYSTOSCOPY WITH BIOPSY N/A 05/28/2016   Procedure: CYSTOSCOPY WITH BIOPSY;  Surgeon: Belvie LITTIE Clara, MD;  Location: [facility];  Service: Urology;  Laterality: N/A;   CYSTOSCOPY WITH URETHRAL DILATATION N/A 05/28/2016   Procedure: CYSTOSCOPY WITH URETHRAL DILATATION;  Surgeon: Belvie LITTIE Clara, MD;  Location: [facility];  Service: Urology;  Laterality: N/A;   HEMOSTASIS CONTROL  08/08/2023   Procedure: HEMOSTASIS CONTROL;  Surgeon: Shelah Lamar RAMAN, MD;  Location: [facility] ENDOSCOPY;  Service: Pulmonary;;   IR IMAGING GUIDED PORT INSERTION  09/19/2023   PROSTATE BIOPSY  05/08/12   Adenocarcinoma   RADIOACTIVE SEED IMPLANT   08/01/2012   Procedure: RADIOACTIVE SEED IMPLANT;  Surgeon: Thomasine Oiler, MD;  Location: [facility];  Service: Urology;  Laterality: N/A;  73 seeds implanted no seeds found in bladder   TRANSURETHRAL RESECTION OF BLADDER TUMOR N/A 11/03/2015   Procedure: TRANSURETHRAL RESECTION OF BLADDER TUMOR (TURBT)WITH INSERTION STENT;  Surgeon: Belvie LITTIE Clara, MD;  Location: [facility];  Service: Urology;  Laterality: N/A;   TRANSURETHRAL RESECTION OF BLADDER TUMOR N/A 02/17/2021   Procedure: TRANSURETHRAL RESECTION OF BLADDER TUMOR (TURBT);  Surgeon: Rosalind Zachary NOVAK, MD;  Location: [facility];  Service: Urology;  Laterality: N/A;  30 MINS   TRANSURETHRAL RESECTION OF BLADDER TUMOR WITH GYRUS (TURBT-GYRUS) N/A 01/09/2016   Procedure: TRANSURETHRAL RESECTION OF BLADDER TUMOR WITH GYRUS (TURBT-GYRUS);  Surgeon: Belvie LITTIE Clara, MD;  Location: [facility];  Service: Urology;  Laterality: N/A;   VIDEO BRONCHOSCOPY WITH RADIAL ENDOBRONCHIAL ULTRASOUND  08/08/2023   Procedure: VIDEO BRONCHOSCOPY WITH RADIAL ENDOBRONCHIAL ULTRASOUND;  Surgeon: Shelah Lamar RAMAN, MD;  Location: [facility] ENDOSCOPY;  Service: Pulmonary;;     Social History:   reports that he quit smoking about 9 years ago. His smoking use included cigarettes. He started smoking about 51 years ago. He has a 21 pack-year smoking history. He has never used smokeless tobacco. He reports that he does not currently use drugs. He reports that he does not drink alcohol.   Family History:  His family history includes Aneurysm in his father; Asthma in his mother and sister; Heart failure in his mother; Hyperlipidemia in his mother and sister.   Allergies Allergies[1]   Home Medications  Prior to Admission medications  Medication Sig Start Date End Date Taking? Authorizing Provider  albuterol  (VENTOLIN  HFA) 108 (90 Base) MCG/ACT inhaler Inhale 1-2 puffs into the lungs every 6 (six) hours as needed  for wheezing or shortness of breath. 05/12/24  Yes Yolande Lamar BROCKS, MD  amLODipine (NORVASC) 10 MG tablet Take 10 mg by mouth daily.   Yes [provider]  APIXABAN  (ELIQUIS ) VTE STARTER PACK (10MG  AND 5MG ) Take as directed on package: start with two-5mg  tablets twice daily for 7 days. On day 8, switch to one-5mg  tablet twice daily. Patient not taking: Reported on 07/25/2024 05/12/24   Yolande Lamar BROCKS, MD  lidocaine -prilocaine  (EMLA ) cream Apply 1 Application topically as needed. Patient not taking: Reported on 07/25/2024 08/25/23   Heilingoetter, Cassandra L, PA-C  memantine  (NAMENDA ) 10 MG tablet Take 1 tablet (10 mg total) by mouth 2 (two) times daily. Patient not taking: Reported on 07/25/2024 12/07/23   Lanell Donald Stagger, PA-C  memantine  (NAMENDA ) 5 MG tablet Begin this prescription the first day of brain radiation. Week 1: take one tablet po qam. Week 2: take one tablet qam and qpm. Week 3: take two tablets qam, and one tablet po q pm. Week 4: take two tablets qam and qpm. Fill subsequent prescription q month. Patient not taking: Reported on 07/25/2024 12/07/23   Lanell Donald Stagger, PA-C  prochlorperazine  (COMPAZINE ) 10 MG tablet Take 1 tablet (10 mg total) by mouth every 6 (six) hours as needed. Patient not taking: Reported on 07/25/2024 08/25/23   Heilingoetter, Cassandra L, PA-C  psyllium (METAMUCIL SMOOTH TEXTURE) 58.6 % powder Take 1 packet by mouth 3 (three) times daily. Patient not taking: Reported on 07/25/2024 05/29/24   Kingsley, Victoria K, DO  senna-docusate (SENOKOT-S) 8.6-50 MG tablet Take 1 tablet by mouth daily. Patient not taking: Reported on 07/25/2024 05/29/24   Ellouise Richerd POUR, DO       The patient is critically ill due to Acute hypoxic resp failure.  Critical care was necessary to treat or prevent imminent or life-threatening deterioration. Critical care time was spent by me on the following activities: development of a treatment plan with the patient  and/or surrogate as well as nursing, discussions with consultants, evaluation of the patient's response to treatment, examination of the patient, obtaining a history from the patient or surrogate, ordering and performing treatments and interventions, ordering and review  of laboratory studies, ordering and review of radiographic studies, review of telemetry data including pulse oximetry, re-evaluation of patient's condition and participation in multidisciplinary rounds.   I personally spent 30 minutes providing critical care not including any separately billable procedures.  Marny Patch, MD [place] [facility] 07/30/2024 9:37 AM                [1] No Known Allergies  "

## 2024-07-30 NOTE — Plan of Care (Signed)

## 2024-07-30 NOTE — Progress Notes (Signed)
 " PROGRESS NOTE    John [name]  FMW:[id number] DOB: [birth year] DOA: 07/25/2024 PCP: Practice, [facility]    Brief Narrative:  John [name] is a 79 y.o. 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 Sherrod.  Underwent radiation in the past.  Received last dose of immunotherapy with durvalumab  12/16. 12/30, he had a chest x-ray done which resulted 10 days later on 1/10 to show bilateral pneumonia.  Patient continued to have symptoms at that time but then suddenly started having shortness of breath, cough and decreased exercise tolerance.SABRA  He does not have any supplemental oxygen  at home. 1/11, he called Dr. Jeannett office and was called in a prescription for doxycycline which he started 1/13.   1/14, patient felt significantly short of breath and hence presented to the ED   In the ED, patient is afebrile, heart rate in 100s, blood pressure in 110s, initially very hypoxic at 68% on room air and required 15 L oxygen  by nasal cannula.   VBG with pH 7.47, pCO2 41, O2 sat 99 CBC with WC count 15, lactic acid normal, CMP unremarkable Respiratory virus panel negative   CT angio chest was negative for pulm embolism but  -showed patchy consolidation and ground glass bilaterally suggestive of multifocal pneumonia. -partially loculated moderate left pleural effusion. -Enlarged pulmonic trunk, indicative of pulmonary arterial hypertension.   Blood culture collected Patient was started on IV Rocephin , azithromycin  Admitted to [facility]   Subsequently, patient's oxygen  requirement worsened significantly up to 50 L heated high flow nasal cannula.  Subjective:  Patient seen and examined.  He was sitting in the chair.   He was more worried about making his bed wet due to leaking condom catheter last night.  Patient tells me that his breathing is okay.  He denies any shortness of breath, he does look mildly dyspneic on interview.  Remains on heated high flow. Can use pure wick due to high flow oxygen  need.  Assessment & Plan:   Acute respiratory failure with hypoxia Multifocal infiltrates Presented with progressively worsening shortness of breath. No fever, WBC count elevated to 15, lactic acid normal, procalcitonin level normal CT scan ruled out PE, showed multifocal infiltrates read as pneumonia Blood cultures, negative so far With a suspicion of pneumonia, patient was given empiric antibiotic coverage with IV Rocephin , azithromycin  (EKG with QTc 427 ms).   1/16, Zosyn  added 1/15, discussed with oncologist Dr. Sherrod.  He recommended prednisone  1 g/kg given at for 7 days. Now on IV Solu-Medrol  60 mg daily. 1/16, oxygenation worsened.  Transferred to stepdown.  Remains on 50 L heated high flow.  On high-dose IV steroids.  Zosyn  added.  Continue chest pressure therapy.  Mobilize with PT OT.   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 Sherrod.  Underwent radiation in the past.  Received last dose of immunotherapy with durvalumab  12/16. Oncology following   Hypertension H/o moderate aortic regurgitation CT angio showed enlarged pulmonary trunk indicating pulmonary artery hypertension Clinically does not look volume overloaded. Last echo from 2018 had shown moderate AI.  Repeat echo report pending PTA meds- amlodipine 10 mg daily.  Currently on hold.  Continue to monitor blood pressure.   H/o prostate cancer s/p brachytherapy H/o bladder cancer    DVT prophylaxis: enoxaparin  (LOVENOX ) injection 40 mg Start: 07/25/24 1800   Code Status: DNR/DNI Family Communication: None at the bedside.  His sister  and family has been updated by critical care. Disposition Plan: Status is: Inpatient Remains inpatient appropriate because: On heated high flow.  Remains critically sick.     Consultants:  Pulmonary Oncology  Procedures:  None  Antimicrobials:  Zosyn      Objective: Vitals:   07/30/24 0600 07/30/24 0700 07/30/24 0800 07/30/24 0944  BP: (!) 117/53 (!) 112/41 (!) 107/41   Pulse: 73 69 72 80  Resp: (!) 21 18 (!) 22 20  Temp:   97.6 F (36.4 C)   TempSrc:   Axillary   SpO2: 92% 93% 91% 91%  Weight:      Height:        Intake/Output Summary (Last 24 hours) at 07/30/2024 1032 Last data filed at 07/30/2024 0900 Gross per 24 hour  Intake 106.86 ml  Output 1000 ml  Net -893.14 ml   Filed Weights   07/27/24 1822  Weight: 63.8 kg    Examination:  General: Mildly dyspneic on talking but able to communicate.  He is on heated high flow 40 L. Cardiovascular: S1-S2 normal.  Regular rate rhythm.  No murmur.  Right chest wall permacath present. Respiratory: Poor bilateral air entry.  Poor inspiratory effort.  No added sounds. SpO2: 91 % O2 Flow Rate (L/min): 40 L/min FiO2 (%): (S) 50 % (per sat)  Gastrointestinal: Soft.  Nontender.  Bowel sound present. Ext: No swelling or edema.  No cyanosis. Neuro: Alert awake and oriented.  No deficits.      Data Reviewed: I have personally reviewed following labs and imaging studies  CBC: Recent Labs  Lab 07/25/24 1314 07/25/24 1328 07/26/24 0510 07/30/24 0619  WBC 15.0*  --  13.2* 17.3*  NEUTROABS 13.3*  --   --  15.0*  HGB 13.7 13.9 12.2* 11.6*  HCT 42.3 41.0 37.7* 35.5*  MCV 96.6  --  96.2 95.9  PLT 240  --  211 189   Basic Metabolic Panel: Recent Labs  Lab 07/25/24 1314 07/25/24 1328 07/26/24 0510 07/30/24 0619  NA 139 139 138 139  K 4.1 4.1 4.0 4.3  CL 104 103 104 102  CO2 29  --  27 32  GLUCOSE 197* 195* 94 90  BUN 17 16 9 19   CREATININE 0.92 1.00 0.72 0.90  CALCIUM 10.3  --  9.6 9.8  MG  --   --   --   2.4   GFR: Estimated Creatinine Clearance: 60.1 mL/min (by C-G formula based on SCr of 0.9 mg/dL). Liver Function Tests: Recent Labs  Lab 07/25/24 1314  AST 27  ALT 11  ALKPHOS 123  BILITOT 0.5  PROT 7.3  ALBUMIN 3.4*   No results for input(s): LIPASE, AMYLASE in the last 168 hours. No results for input(s): AMMONIA in the last 168 hours. Coagulation Profile: Recent Labs  Lab 07/25/24 1314  INR 1.1   Cardiac Enzymes: No results for input(s): CKTOTAL, CKMB, CKMBINDEX, TROPONINI in the last 168 hours. BNP (last 3 results) Recent Labs    05/12/24 1726 05/29/24 1826 07/25/24 1314  PROBNP 113.0 113.0 380.0*   HbA1C: No results for input(s): HGBA1C in the last 72 hours. CBG: No results for input(s): GLUCAP in the last 168 hours. Lipid Profile: No results for input(s): CHOL, HDL, LDLCALC,  TRIG, CHOLHDL, LDLDIRECT in the last 72 hours. Thyroid  Function Tests: No results for input(s): TSH, T4TOTAL, FREET4, T3FREE, THYROIDAB in the last 72 hours. Anemia Panel: No results for input(s): VITAMINB12, FOLATE, FERRITIN, TIBC, IRON, RETICCTPCT in the last 72 hours. Sepsis Labs: Recent Labs  Lab 07/25/24 1328 07/26/24 0510 07/28/24 1521  PROCALCITON  --  0.11 <0.10  LATICACIDVEN 1.6  --   --     Recent Results (from the past 240 hours)  Blood Culture (routine x 2)     Status: None   Collection Time: 07/25/24  1:25 PM   Specimen: BLOOD RIGHT FOREARM  Result Value Ref Range Status   Specimen Description   Final    BLOOD RIGHT FOREARM Performed at [facility], [address], [place], [place] [address]    Special Requests   Final    BOTTLES DRAWN AEROBIC AND ANAEROBIC Blood Culture adequate volume Performed at [facility], [address], [place], [place] [address]    Culture   Final    NO GROWTH 5 DAYS Performed at [facility], [address] [place], [place] [address]    Report  Status 07/30/2024 FINAL  Final  Blood Culture (routine x 2)     Status: None   Collection Time: 07/25/24  1:33 PM   Specimen: BLOOD  Result Value Ref Range Status   Specimen Description   Final    BLOOD LEFT ANTECUBITAL Performed at [facility], [address], [place], [place] [address]    Special Requests   Final    BOTTLES DRAWN AEROBIC AND ANAEROBIC Blood Culture adequate volume Performed at [facility], [address], [place], [place] [address]    Culture   Final    NO GROWTH 5 DAYS Performed at [facility], [address] [place], [place] [address]    Report Status 07/30/2024 FINAL  Final  Resp panel by RT-PCR (RSV, Flu A&B, Covid) Anterior Nasal Swab     Status: None   Collection Time: 07/25/24  1:47 PM   Specimen: Anterior Nasal Swab  Result Value Ref Range Status   SARS Coronavirus 2 by RT PCR NEGATIVE NEGATIVE Final    Comment: (NOTE) SARS-CoV-2 target nucleic acids are NOT DETECTED.  The SARS-CoV-2 RNA is generally detectable in upper respiratory specimens during the acute phase of infection. The lowest concentration of SARS-CoV-2 viral copies this assay can detect is 138 copies/mL. A negative result does not preclude SARS-Cov-2 infection and should not be used as the sole basis for treatment or other patient management decisions. A negative result may occur with  improper specimen collection/handling, submission of specimen other than nasopharyngeal swab, presence of viral mutation(s) within the areas targeted by this assay, and inadequate number of viral copies(<138 copies/mL). A negative result must be combined with clinical observations, patient history, and epidemiological information. The expected result is Negative.  Fact Sheet for Patients:  bloggercourse.com  Fact Sheet for Healthcare Providers:  seriousbroker.it  This test is no t yet approved or cleared  by the [place]  FDA and  has been authorized for detection and/or diagnosis of SARS-CoV-2 by FDA under an Emergency Use Authorization (EUA). This EUA will remain  in effect (meaning this test can be used) for the duration of the COVID-19 declaration under Section 564(b)(1) of the Act, 21 U.S.C.section 360bbb-3(b)(1), unless the authorization is terminated  or revoked sooner.       Influenza A by  PCR NEGATIVE NEGATIVE Final   Influenza B by PCR NEGATIVE NEGATIVE Final    Comment: (NOTE) The Xpert Xpress SARS-CoV-2/FLU/RSV plus assay is intended as an aid in the diagnosis of influenza from Nasopharyngeal swab specimens and should not be used as a sole basis for treatment. Nasal washings and aspirates are unacceptable for Xpert Xpress SARS-CoV-2/FLU/RSV testing.  Fact Sheet for Patients: bloggercourse.com  Fact Sheet for Healthcare Providers: seriousbroker.it  This test is not yet approved or cleared by the [place]  FDA and has been authorized for detection and/or diagnosis of SARS-CoV-2 by FDA under an Emergency Use Authorization (EUA). This EUA will remain in effect (meaning this test can be used) for the duration of the COVID-19 declaration under Section 564(b)(1) of the Act, 21 U.S.C. section 360bbb-3(b)(1), unless the authorization is terminated or revoked.     Resp Syncytial Virus by PCR NEGATIVE NEGATIVE Final    Comment: (NOTE) Fact Sheet for Patients: bloggercourse.com  Fact Sheet for Healthcare Providers: seriousbroker.it  This test is not yet approved or cleared by the [place]  FDA and has been authorized for detection and/or diagnosis of SARS-CoV-2 by FDA under an Emergency Use Authorization (EUA). This EUA will remain in effect (meaning this test can be used) for the duration of the COVID-19 declaration under Section 564(b)(1) of the Act, 21  U.S.C. section 360bbb-3(b)(1), unless the authorization is terminated or revoked.  Performed at [facility], [address] [place], [place] [address]   MRSA Next Gen by PCR, Nasal     Status: None   Collection Time: 07/26/24  2:43 PM   Specimen: Nasal Mucosa; Nasal Swab  Result Value Ref Range Status   MRSA by PCR Next Gen NOT DETECTED NOT DETECTED Final    Comment: (NOTE) The GeneXpert MRSA Assay (FDA approved for NASAL specimens only), is one component of a comprehensive MRSA colonization surveillance program. It is not intended to diagnose MRSA infection nor to guide or monitor treatment for MRSA infections. Test performance is not FDA approved in patients less than 36 years old. Performed at [facility], [address] [place], [place] [address]          Radiology Studies: DG CHEST PORT 1 VIEW Result Date: 07/29/2024 CLINICAL DATA:  Shortness of breath. EXAM: PORTABLE CHEST 1 VIEW COMPARISON:  Chest radiograph dated 07/25/2024. FINDINGS: Right-sided Port-A-Cath in similar position. No significant interval change in bilateral pulmonary opacities and small left pleural effusion compared to prior radiograph. No pneumothorax. Stable cardiac silhouette no acute osseous pathology. IMPRESSION: No significant interval change. Electronically Signed   By: Vanetta Chou M.D.   On: 07/29/2024 16:18         Scheduled Meds:  Chlorhexidine  Gluconate Cloth  6 each Topical Daily   enoxaparin  (LOVENOX ) injection  40 mg Subcutaneous Q24H   feeding supplement  237 mL Oral BID BM   guaiFENesin   600 mg Oral BID   methylPREDNISolone  (SOLU-MEDROL ) injection  60 mg Intravenous Daily   pneumococcal 20-valent conjugate vaccine  0.5 mL Intramuscular Tomorrow-1000   senna  1 tablet Oral BID   sodium chloride  flush  10-40 mL Intracatheter Q12H   Continuous Infusions:  piperacillin -tazobactam (ZOSYN )  IV 12.5 mL/hr at 07/30/24 0658     LOS: 5 days     Time spent: 50 minutes    Renato Applebaum, MD [facility]   "

## 2024-07-30 NOTE — Progress Notes (Signed)
 Chaplains received a consult to provide John [name] with information on HCPOA.  I provided him with the documents and he plans to look them over and discuss them with his sister.  I will plan to follow up with him tomorrow, but please also page as needs arise.

## 2024-07-31 ENCOUNTER — Inpatient Hospital Stay

## 2024-07-31 ENCOUNTER — Inpatient Hospital Stay: Admitting: Physician Assistant

## 2024-07-31 DIAGNOSIS — C3490 Malignant neoplasm of unspecified part of unspecified bronchus or lung: Secondary | ICD-10-CM | POA: Diagnosis not present

## 2024-07-31 DIAGNOSIS — Z8551 Personal history of malignant neoplasm of bladder: Secondary | ICD-10-CM | POA: Diagnosis not present

## 2024-07-31 DIAGNOSIS — J188 Other pneumonia, unspecified organism: Secondary | ICD-10-CM | POA: Diagnosis not present

## 2024-07-31 DIAGNOSIS — Z66 Do not resuscitate: Secondary | ICD-10-CM

## 2024-07-31 DIAGNOSIS — J90 Pleural effusion, not elsewhere classified: Secondary | ICD-10-CM

## 2024-07-31 DIAGNOSIS — N2889 Other specified disorders of kidney and ureter: Secondary | ICD-10-CM | POA: Diagnosis not present

## 2024-07-31 DIAGNOSIS — Z8546 Personal history of malignant neoplasm of prostate: Secondary | ICD-10-CM | POA: Diagnosis not present

## 2024-07-31 MED ORDER — TRAZODONE HCL 50 MG PO TABS
50.0000 mg | ORAL_TABLET | Freq: Every evening | ORAL | Status: AC | PRN
  Administered 2024-08-02 – 2024-08-17 (×9): 50 mg via ORAL
  Filled 2024-07-31 (×9): qty 1

## 2024-07-31 NOTE — Progress Notes (Addendum)
 MICHAL CALLICOTT   DOB:[birth year]   FM#:[id number]      CLINICAL SUMMARY:  MAHDI FRYE is a 79 year old male patient who presented on 07/25/2024 with complaints of progressive shortness of breath and cough.  Oncologic history is significant for lung cancer, on immunotherapy.  Now diagnosed with pneumonia.  Medical oncology following.     INTERVAL HISTORY:  -Patient diagnosed 08/08/2023 with lung cancer.  Status post radiation therapy and chemo immuno therapy.  Also with history of prostate CVA and urothelial CA with kidney lesion. -Admitted with complaints of shortness of breath and cough.  Diagnosed with pneumonia.  Started on steroids and antibiotics.  ASSESSMENT & PLAN:  Extensive stage small cell lung cancer - Status post 12 cycles carboplatin , etoposide , Imfinzi .  Durvalumab  immunotherapy was planned for 07/31/2024, now on hold.  No immunotherapy planned during inpatient admission. - Medical oncology/Dr. Sherrod following.  Shortness of breath with cough Pleural effusion Pneumonia - Remains short of breath although patient reports that he is feeling slightly better.  Noted to be on high flow [place]. - Continue antibiotics and steroids as ordered.  Recommend 7 days steroid therapy.  If patient is discharged prior, continue prednisone  1 mg/kg x 7 days. - Continue O2 and respiratory therapy.  History of prostate cancer, 2013 History of urothelial carcinoma, 2022 - Continue surveillance as outpatient   Left kidney metastatic lesion - Status post SBRT completed 04/27/2024  Code Status DNR-limited  Subjective:  Patient seen awake and alert in ICU bed.  HF [place] intact.  Appears short of breath when he is talking although states he is feeling better.  Expresses frustration that he is not completely better by now.  No other complaints offered.  Objective:   Intake/Output Summary (Last 24 hours) at 07/31/2024 1017 Last data filed at 07/31/2024 0956 Gross per 24 hour  Intake 433.14 ml  Output  1000 ml  Net -566.86 ml     PHYSICAL EXAMINATION: ECOG PERFORMANCE STATUS: 3 - Symptomatic, >50% confined to bed  Vitals:   07/31/24 0800 07/31/24 0828  BP: (!) 116/48 (!) 116/48  Pulse: 74   Resp: (!) 22   Temp: 97.9 F (36.6 C)   SpO2: 91%    Filed Weights   07/27/24 1822  Weight: 140 lb 10.5 oz (63.8 kg)    GENERAL: alert, + moderate respiratory distress + chronically ill-appearing SKIN: skin color, texture, turgor are normal, no rashes or significant lesions EYES: normal, conjunctiva are pink and non-injected, sclera clear OROPHARYNX: no exudate, no erythema and lips, buccal mucosa, and tongue normal  NECK: supple, thyroid  normal size, non-tender, without nodularity LYMPH: no palpable lymphadenopathy in the cervical, axillary or inguinal LUNGS: + Diminished to auscultation  HEART: regular rate & rhythm and no murmurs and no lower extremity edema ABDOMEN: abdomen soft, non-tender and normal bowel sounds MUSCULOSKELETAL: + Cachectic PSYCH: alert & oriented x 3 with fluent speech NEURO: no focal motor/sensory deficits   All questions were answered. The patient knows to call the clinic with any problems, questions or concerns.   I personally spent a total of 40 minutes minutes in the care of the patient today including preparing to see the patient, getting/reviewing separately obtained history, performing a medically appropriate exam/evaluation, counseling and educating, referring and communicating with other health care professionals, documenting clinical information in the EHR, communicating results, and coordinating care.    Olam PARAS Rouson, NP 07/31/2024 10:17 AM    Labs Reviewed:  Lab Results  Component Value Date  WBC 17.3 (H) 07/30/2024   HGB 11.6 (L) 07/30/2024   HCT 35.5 (L) 07/30/2024   MCV 95.9 07/30/2024   PLT 189 07/30/2024   Recent Labs    05/29/24 1450 05/29/24 1501 06/26/24 0831 07/25/24 1314 07/25/24 1328 07/26/24 0510 07/30/24 0619  NA  142   < > 139 139 139 138 139  K 4.0   < > 4.4 4.1 4.1 4.0 4.3  CL 103   < > 104 104 103 104 102  CO2 28  --  29 29  --  27 32  GLUCOSE 136*   < > 160* 197* 195* 94 90  BUN 15   < > 11 17 16 9 19   CREATININE 0.96   < > 0.93 0.92 1.00 0.72 0.90  CALCIUM 10.2  --  10.0 10.3  --  9.6 9.8  GFRNONAA >60  --  >60 >60  --  >60 >60  PROT 7.2  --  6.8 7.3  --   --   --   ALBUMIN 3.9  --  3.7 3.4*  --   --   --   AST 20  --  18 27  --   --   --   ALT 13  --  10 11  --   --   --   ALKPHOS 78  --  78 123  --   --   --   BILITOT 0.4  --  0.4 0.5  --   --   --    < > = values in this interval not displayed.    Studies Reviewed:   DG CHEST PORT 1 VIEW Result Date: 07/29/2024 CLINICAL DATA:  Shortness of breath. EXAM: PORTABLE CHEST 1 VIEW COMPARISON:  Chest radiograph dated 07/25/2024. FINDINGS: Right-sided Port-A-Cath in similar position. No significant interval change in bilateral pulmonary opacities and small left pleural effusion compared to prior radiograph. No pneumothorax. Stable cardiac silhouette no acute osseous pathology. IMPRESSION: No significant interval change. Electronically Signed   By: Vanetta Chou M.D.   On: 07/29/2024 16:18   ECHOCARDIOGRAM COMPLETE Result Date: 07/27/2024    ECHOCARDIOGRAM REPORT   Patient Name:   VERNA HAMON Date of Exam: 07/27/2024 Medical Rec #:  [id number]    Height:       72.0 in Accession #:    [id number]   Weight:       150.0 lb Date of Birth:  1945/07/24    BSA:          1.885 m Patient Age:    78 years     BP:           106/44 mmHg Patient Gender: M            HR:           80 bpm. Exam Location:  Inpatient Procedure: 2D Echo, Cardiac Doppler and Color Doppler (Both Spectral and Color            Flow Doppler were utilized during procedure). Indications:    Aortic regurgitation I35.1  History:        Patient has no prior history of Echocardiogram examinations.                 Risk Factors:Hypertension.  Sonographer:    Tinnie Gosling RDCS Referring Phys:  [id number] BINAYA DAHAL IMPRESSIONS  1. Left ventricular ejection fraction, by estimation, is 60 to 65%. The left ventricle has normal function. The left ventricle has no regional wall  motion abnormalities. There is mild left ventricular hypertrophy.  2. Right ventricular systolic function is normal. The right ventricular size is normal.  3. Trivial mitral valve regurgitation.  4. The aortic valve is tricuspid. Aortic valve regurgitation is mild to moderate. Aortic valve sclerosis/calcification is present, without any evidence of aortic stenosis.  5. The inferior vena cava is normal in size with greater than 50% respiratory variability, suggesting right atrial pressure of 3 mmHg. FINDINGS  Left Ventricle: Left ventricular ejection fraction, by estimation, is 60 to 65%. The left ventricle has normal function. The left ventricle has no regional wall motion abnormalities. The left ventricular internal cavity size was normal in size. There is  mild left ventricular hypertrophy. Right Ventricle: The right ventricular size is normal. Right vetricular wall thickness was not assessed. Right ventricular systolic function is normal. Left Atrium: Left atrial size was normal in size. Right Atrium: Right atrial size was normal in size. Pericardium: There is no evidence of pericardial effusion. Mitral Valve: There is mild thickening of the mitral valve leaflet(s). Mild mitral annular calcification. Trivial mitral valve regurgitation. Tricuspid Valve: The tricuspid valve is normal in structure. Tricuspid valve regurgitation is mild. Aortic Valve: The aortic valve is tricuspid. Aortic valve regurgitation is mild to moderate. Aortic regurgitation PHT measures 611 msec. Aortic valve sclerosis/calcification is present, without any evidence of aortic stenosis. Pulmonic Valve: The pulmonic valve was normal in structure. Pulmonic valve regurgitation is not visualized. Aorta: The aortic root and ascending aorta are structurally normal, with  no evidence of dilitation. Venous: The inferior vena cava is normal in size with greater than 50% respiratory variability, suggesting right atrial pressure of 3 mmHg. IAS/Shunts: No atrial level shunt detected by color flow Doppler.  LEFT VENTRICLE PLAX 2D LVIDd:         3.90 cm     Diastology LVIDs:         2.50 cm     LV e' medial:    6.09 cm/s LV PW:         1.20 cm     LV E/e' medial:  7.8 LV IVS:        1.10 cm     LV e' lateral:   7.29 cm/s LVOT diam:     2.30 cm     LV E/e' lateral: 6.5 LV SV:         80 LV SV Index:   42 LVOT Area:     4.15 cm  LV Volumes (MOD) LV vol d, MOD A2C: 82.3 ml LV vol d, MOD A4C: 62.2 ml LV vol s, MOD A2C: 27.3 ml LV vol s, MOD A4C: 31.3 ml LV SV MOD A2C:     55.0 ml LV SV MOD A4C:     62.2 ml LV SV MOD BP:      51.2 ml RIGHT VENTRICLE             IVC RV S prime:     11.10 cm/s  IVC diam: 1.90 cm TAPSE (M-mode): 1.3 cm LEFT ATRIUM             Index        RIGHT ATRIUM           Index LA diam:        2.90 cm 1.54 cm/m   RA Area:     13.10 cm LA Vol (A2C):   20.0 ml 10.61 ml/m  RA Volume:   30.50 ml  16.18 ml/m LA Vol (A4C):  28.9 ml 15.33 ml/m LA Biplane Vol: 25.4 ml 13.47 ml/m  AORTIC VALVE LVOT Vmax:   104.00 cm/s LVOT Vmean:  61.400 cm/s LVOT VTI:    0.192 m AI PHT:      611 msec  AORTA Ao Root diam: 3.40 cm Ao Asc diam:  3.70 cm MITRAL VALVE               TRICUSPID VALVE MV Area (PHT): 3.55 cm    TR Peak grad:   34.1 mmHg MV E velocity: 47.60 cm/s  TR Vmax:        292.00 cm/s MV A velocity: 87.80 cm/s MV E/A ratio:  0.54        SHUNTS                            Systemic VTI:  0.19 m                            Systemic Diam: 2.30 cm Vina Gull MD Electronically signed by Vina Gull MD Signature Date/Time: 07/27/2024/12:21:41 PM    Final    CT Angio Chest Pulmonary Embolism (PE) W or WO Contrast Result Date: 07/25/2024 CLINICAL DATA:  Lung cancer recent pneumonia, decreased O2 sats weakness. Shortness of breath and abdominal pain. * Tracking Code: BO * EXAM: CT  ANGIOGRAPHY CHEST CT ABDOMEN AND PELVIS WITH CONTRAST TECHNIQUE: Multidetector CT imaging of the chest was performed using the standard protocol during bolus administration of intravenous contrast. Multiplanar CT image reconstructions and MIPs were obtained to evaluate the vascular anatomy. Multidetector CT imaging of the abdomen and pelvis was performed using the standard protocol during bolus administration of intravenous contrast. RADIATION DOSE REDUCTION: This exam was performed according to the departmental dose-optimization program which includes automated exposure control, adjustment of the mA and/or kV according to patient size and/or use of iterative reconstruction technique. CONTRAST:  75mL OMNIPAQUE  IOHEXOL  350 MG/ML SOLN COMPARISON:  CT abdomen pelvis 05/29/2024 PET 03/05/2024 and CT chest 07/25/2024. FINDINGS: CTA CHEST FINDINGS Cardiovascular: Negative for pulmonary embolus. Right IJ Port-A-Cath terminates in the low SVC or SVC RA junction. Atherosclerotic calcification of the aorta and coronary arteries. Enlarged pulmonic trunk and heart. Left ventricle is dilated. No pericardial effusion. Mediastinum/Nodes: Mediastinal lymph nodes measure up to 13 mm in the low right paratracheal station, previously 6 mm. Soft tissue thickening in the left hilum. Right hilar lymph nodes measure up to 10 mm. Air in the esophagus can be seen with dysmotility. Lungs/Pleura: Centrilobular and paraseptal emphysema. Patchy consolidation and ground-glass bilaterally, new from 03/05/2024. Post radiation architectural distortion in the upper and lower lobes. Moderate partially loculated left pleural effusion. Airway is unremarkable. Musculoskeletal: Degenerative changes in the spine. No worrisome lytic or sclerotic lesions. Review of the MIP images confirms the above findings. CT ABDOMEN and PELVIS FINDINGS Hepatobiliary: Tiny low-attenuation lesion in the left hepatic lobe, too small to characterize. Liver and gallbladder  are otherwise unremarkable. No biliary ductal dilatation. Pancreas: Negative. Spleen: Negative. Adrenals/Urinary Tract: Adrenal glands are unremarkable. Low-attenuation lesions in the kidneys. No specific follow-up necessary. Ureters are decompressed. Bladder is grossly unremarkable. Stomach/Bowel: Stomach, small bowel, appendix and colon are unremarkable. Vascular/Lymphatic: Atherosclerotic calcification of the aorta. No pathologically enlarged lymph nodes. 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 Other: No free fluid. Musculoskeletal: Degenerative changes in the spine. Dextroconvex scoliosis. No worrisome lytic or sclerotic lesions. Review of the MIP images confirms the  above findings. IMPRESSION: 1. Negative for pulmonary embolus. 2. Multilobar pneumonia. 3. 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 Evaluation for recurrent disease is challenging due to surrounding acute ground-glass and consolidation. 4. Interval enlargement of mediastinal lymph nodes may be reactive in etiology. Recommend attention on follow-up. 5. Partially loculated moderate left pleural effusion. 6. Aortic atherosclerosis (ICD10-I70.0). Coronary artery calcification. 7. Enlarged pulmonic trunk, indicative of pulmonary arterial hypertension. Electronically Signed   By: Newell Eke M.D.   On: 07/25/2024 14:29   CT ABDOMEN PELVIS WO CONTRAST Result Date: 07/25/2024 CLINICAL DATA:  Lung cancer recent pneumonia, decreased O2 sats weakness. Shortness of breath and abdominal pain. * Tracking Code: BO * EXAM: CT ANGIOGRAPHY CHEST CT ABDOMEN AND PELVIS WITH CONTRAST TECHNIQUE: Multidetector CT imaging of the chest was performed using the standard protocol during bolus administration of intravenous contrast. Multiplanar CT image reconstructions and MIPs were obtained to evaluate the vascular anatomy. Multidetector CT imaging of the abdomen and pelvis was performed using the standard protocol during bolus  administration of intravenous contrast. RADIATION DOSE REDUCTION: This exam was performed according to the departmental dose-optimization program which includes automated exposure control, adjustment of the mA and/or kV according to patient size and/or use of iterative reconstruction technique. CONTRAST:  75mL OMNIPAQUE  IOHEXOL  350 MG/ML SOLN COMPARISON:  CT abdomen pelvis 05/29/2024 PET 03/05/2024 and CT chest 07/25/2024. FINDINGS: CTA CHEST FINDINGS Cardiovascular: Negative for pulmonary embolus. Right IJ Port-A-Cath terminates in the low SVC or SVC RA junction. Atherosclerotic calcification of the aorta and coronary arteries. Enlarged pulmonic trunk and heart. Left ventricle is dilated. No pericardial effusion. Mediastinum/Nodes: Mediastinal lymph nodes measure up to 13 mm in the low right paratracheal station, previously 6 mm. Soft tissue thickening in the left hilum. Right hilar lymph nodes measure up to 10 mm. Air in the esophagus can be seen with dysmotility. Lungs/Pleura: Centrilobular and paraseptal emphysema. Patchy consolidation and ground-glass bilaterally, new from 03/05/2024. Post radiation architectural distortion in the upper and lower lobes. Moderate partially loculated left pleural effusion. Airway is unremarkable. Musculoskeletal: Degenerative changes in the spine. No worrisome lytic or sclerotic lesions. Review of the MIP images confirms the above findings. CT ABDOMEN and PELVIS FINDINGS Hepatobiliary: Tiny low-attenuation lesion in the left hepatic lobe, too small to characterize. Liver and gallbladder are otherwise unremarkable. No biliary ductal dilatation. Pancreas: Negative. Spleen: Negative. Adrenals/Urinary Tract: Adrenal glands are unremarkable. Low-attenuation lesions in the kidneys. No specific follow-up necessary. Ureters are decompressed. Bladder is grossly unremarkable. Stomach/Bowel: Stomach, small bowel, appendix and colon are unremarkable. Vascular/Lymphatic: Atherosclerotic  calcification of the aorta. No pathologically enlarged lymph nodes. 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 Other: No free fluid. Musculoskeletal: Degenerative changes in the spine. Dextroconvex scoliosis. No worrisome lytic or sclerotic lesions. Review of the MIP images confirms the above findings. IMPRESSION: 1. Negative for pulmonary embolus. 2. Multilobar pneumonia. 3. 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 Evaluation for recurrent disease is challenging due to surrounding acute ground-glass and consolidation. 4. Interval enlargement of mediastinal lymph nodes may be reactive in etiology. Recommend attention on follow-up. 5. Partially loculated moderate left pleural effusion. 6. Aortic atherosclerosis (ICD10-I70.0). Coronary artery calcification. 7. Enlarged pulmonic trunk, indicative of pulmonary arterial hypertension. Electronically Signed   By: Newell Eke M.D.   On: 07/25/2024 14:29   DG Chest Port 1 View Result Date: 07/25/2024 EXAM: 1 VIEW(S) XRAY OF THE CHEST 07/25/2024 01:55:00 AM COMPARISON: 07/10/2024 CLINICAL HISTORY: Questionable sepsis; evaluate for abnormality. FINDINGS: LINES, TUBES  AND DEVICES: Right chest port with tip in the right atrium. LUNGS AND PLEURA: New airspace opacity in Right Lower Lung Zone. Hazy opacity throughout Left Lung. Bandlike scarring in Right Mid, unchanged. Elevated Left Hemidiaphragm. Small Left Pleural Effusion. Hazy airspace opacities have worsened throughout the Left Lung. Patchy airspace opacities in the Right Lung Base, somewhat more nodular, worsened in the interim. No pneumothorax. HEART AND MEDIASTINUM: The tip of the right chest port is in the right atrium. The cardiac and mediastinal silhouettes are otherwise without acute abnormality. BONES AND SOFT TISSUES: No acute osseous abnormality. Multilevel thoracic osteophytosis. IMPRESSION: 1. Worsening bilateral airspace opacities, including worsening patchy right basilar  opacities and worsening hazy opacities throughout the left lung, concerning for worsening multifocal pneumonia. 2. Small left pleural effusion. Electronically signed by: Rogelia Myers MD 07/25/2024 02:06 PM EST RP Workstation: [id number]   DG Chest 2 View Result Date: 07/21/2024 CLINICAL DATA:  Shortness of breath. Left lung cancer treated with chemotherapy and radiation therapy. EXAM: CHEST - 2 VIEW COMPARISON:  05/29/2024 and chest CTA dated 05/12/2024. FINDINGS: Normal-sized heart. Bilateral postradiation changes with progression in the left upper lung zone and interval atelectasis in the right upper lung zone. Interval mild patchy density in both lower lung zones. Stable elevation of the left hemidiaphragm with tenting. The right jugular porta catheter is unchanged with its tip in the region of the superior cavoatrial junction. Mild scoliosis. Mild-to-moderate right glenohumeral degenerative changes. IMPRESSION: 1. Interval mild patchy density in both lower lung zones, suspicious for pneumonia. 2. Bilateral postradiation changes with progression in the left upper lung zone and interval atelectasis in the right upper lung zone. Electronically Signed   By: Elspeth Bathe M.D.   On: 07/21/2024 15:55   ADDENDUM: Hematology/Oncology Attending: The patient is seen and examined today.  I agree with the above note.  He is a very pleasant 79 years old African-American male diagnosed with extensive stage small cell lung cancer in January 2025 status post initial systemic chemoimmunotherapy with carboplatin , etoposide  and durvalumab  every 3 weeks for 4 cycles and starting from cycle #5 he has been on maintenance treatment with single agent immunotherapy with durvalumab  for a total of 12 cycles.  The patient has been tolerating his treatment well but he was admitted to the hospital with significant shortness of breath and he was found to have multilobar pneumonia.  He started treatment with antibiotics and currently  on Zosyn .  He is tolerating his treatment well with some improvement his condition.  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 I recommended for the patient to stay on the current treatment plan directed by the primary team for now. Will continue to hold his treatment with immunotherapy.  We will assess the patient after discharge for reconsideration of treatment with immunotherapy if his condition improves. Thank you for taking good care of Mr. Depoy.  Please call if you have any questions. Disclaimer: This note was dictated with voice recognition software. Similar sounding words can inadvertently be transcribed and may be missed upon review. Sherrod MARLA Sherrod, MD

## 2024-07-31 NOTE — Plan of Care (Signed)
" °  Problem: Clinical Measurements: Goal: Diagnostic test results will improve Outcome: Progressing Goal: Respiratory complications will improve Outcome: Not Progressing Goal: Cardiovascular complication will be avoided Outcome: Progressing   Problem: Nutrition: Goal: Adequate nutrition will be maintained Outcome: Progressing   Problem: Coping: Goal: Level of anxiety will decrease Outcome: Progressing   Problem: Elimination: Goal: Will not experience complications related to bowel motility Outcome: Progressing Goal: Will not experience complications related to urinary retention Outcome: Progressing   "

## 2024-07-31 NOTE — Progress Notes (Signed)
 " PROGRESS NOTE    John [name]  FMW:[id number] DOB: 02-23-1946 DOA: 07/25/2024 PCP: Practice, [facility]    Brief Narrative:  John [name] is a 79 y.o. 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 Sherrod.  Underwent radiation in the past.  Received last dose of immunotherapy with durvalumab  12/16. 12/30, he had a chest x-ray done which resulted 10 days later on 1/10 to show bilateral pneumonia.  Patient continued to have symptoms at that time but then suddenly started having shortness of breath, cough and decreased exercise tolerance.SABRA  He does not have any supplemental oxygen  at home. 1/11, he called Dr. Jeannett office and was called in a prescription for doxycycline which he started 1/13.   1/14, patient felt significantly short of breath and hence presented to the ED   In the ED, patient is afebrile, heart rate in 100s, blood pressure in 110s, initially very hypoxic at 68% on room air and required 15 L oxygen  by nasal cannula.   VBG with pH 7.47, pCO2 41, O2 sat 99 CBC with WC count 15, lactic acid normal, CMP unremarkable Respiratory virus panel negative   CT angio chest was negative for pulm embolism but  -showed patchy consolidation and ground glass bilaterally suggestive of multifocal pneumonia. -partially loculated moderate left pleural effusion. -Enlarged pulmonic trunk, indicative of pulmonary arterial hypertension.   Blood culture collected Patient was started on IV Rocephin , azithromycin  Admitted to [facility]   Subsequently, patient's oxygen  requirement worsened significantly up to 50 L heated high flow nasal cannula. Patient remains in stepdown unit closely followed by  pulmonary.   Subjective:  Patient seen and examined.  Overnight he was slightly confused and was pulling off his PureWick.  On my exam, he told me he had a long morning.  He looked slightly impulsive than usual state.  Still remains on heated high flow.  Denies any chest pain or shortness of breath even though he looks mildly dyspneic on talking. He was happy to have pure wick so he did not wet his bed.  Assessment & Plan:   Acute respiratory failure with hypoxia Multifocal infiltrates Presented with progressively worsening shortness of breath. No fever, WBC count elevated to 15, lactic acid normal, procalcitonin level normal CT scan ruled out PE, showed multifocal infiltrates read as pneumonia Blood cultures, negative so far With a suspicion of pneumonia, patient was given empiric antibiotic coverage with IV Rocephin , azithromycin   1/16, Zosyn  added 1/15, discussed with oncologist Dr. Sherrod.  He recommended prednisone  1 g/kg given at for 7 days. Now on IV Solu-Medrol  60 mg daily. 1/16, oxygenation worsened.  Transferred to stepdown.  Remains on 50 L heated high flow.  On high-dose IV steroids.  Zosyn  added.  Continue chest pressure therapy.  Mobilize with PT OT.  Remains in poor overall condition.  Now DNR/DNI.   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 Sherrod.  Underwent radiation in the past.  Received last dose of immunotherapy with durvalumab  12/16. Oncology following   Hypertension H/o moderate aortic regurgitation CT angio showed enlarged pulmonary trunk indicating pulmonary artery hypertension Clinically does not look volume overloaded. Last echo from  2018 had shown moderate AI.  Repeat echo report pending PTA meds- amlodipine 10 mg daily.  Currently on hold.  Continue to monitor blood pressure.   H/o prostate cancer s/p brachytherapy H/o bladder cancer    DVT  prophylaxis: enoxaparin  (LOVENOX ) injection 40 mg Start: 07/25/24 1800   Code Status: DNR/DNI Family Communication: None at the bedside.   Disposition Plan: Status is: Inpatient Remains inpatient appropriate because: On heated high flow.  Remains critically sick.     Consultants:  Pulmonary Oncology  Procedures:  None  Antimicrobials:  Zosyn      Objective: Vitals:   07/31/24 0500 07/31/24 0700 07/31/24 0800 07/31/24 0828  BP: (!) 159/48 (!) 149/42 (!) 116/48 (!) 116/48  Pulse: 73 63 74   Resp: (!) 31 17 (!) 22   Temp:   97.9 F (36.6 C)   TempSrc:   Oral   SpO2: (!) 83% 95% 91%   Weight:      Height:        Intake/Output Summary (Last 24 hours) at 07/31/2024 1017 Last data filed at 07/31/2024 0956 Gross per 24 hour  Intake 433.14 ml  Output 1000 ml  Net -566.86 ml   Filed Weights   07/27/24 1822  Weight: 63.8 kg    Examination:  General: Mildly dyspneic on talking but able to communicate and complete sentences. He is on heated high flow 40 L. Cardiovascular: S1-S2 normal.  Regular rate rhythm.  No murmur.  Right chest wall permacath present. Respiratory: Poor bilateral air entry.  Poor inspiratory effort.  No additional sounds. SpO2: 91 % O2 Flow Rate (L/min): 40 L/min FiO2 (%): 50 %  Gastrointestinal: Soft.  Nontender.  Bowel sound present. Ext: No swelling or edema.  No cyanosis. Neuro: Alert awake and mostly oriented.  Slightly impulsive today.  No deficits.      Data Reviewed: I have personally reviewed following labs and imaging studies  CBC: Recent Labs  Lab 07/25/24 1314 07/25/24 1328 07/26/24 0510 07/30/24 0619  WBC 15.0*  --  13.2* 17.3*  NEUTROABS 13.3*  --   --  15.0*  HGB 13.7 13.9 12.2* 11.6*  HCT 42.3 41.0 37.7* 35.5*  MCV 96.6  --  96.2 95.9  PLT 240  --  211 189   Basic Metabolic Panel: Recent Labs  Lab 07/25/24 1314 07/25/24 1328 07/26/24 0510 07/30/24 0619  NA 139 139 138 139  K 4.1 4.1 4.0 4.3  CL 104 103 104  102  CO2 29  --  27 32  GLUCOSE 197* 195* 94 90  BUN 17 16 9 19   CREATININE 0.92 1.00 0.72 0.90  CALCIUM 10.3  --  9.6 9.8  MG  --   --   --  2.4   GFR: Estimated Creatinine Clearance: 60.1 mL/min (by C-G formula based on SCr of 0.9 mg/dL). Liver Function Tests: Recent Labs  Lab 07/25/24 1314  AST 27  ALT 11  ALKPHOS 123  BILITOT 0.5  PROT 7.3  ALBUMIN 3.4*   No results for input(s): LIPASE, AMYLASE in the last 168 hours. No results for input(s): AMMONIA in the last 168 hours. Coagulation Profile: Recent Labs  Lab 07/25/24 1314  INR 1.1   Cardiac Enzymes: No results for input(s): CKTOTAL, CKMB, CKMBINDEX, TROPONINI in the last 168 hours. BNP (last 3 results) Recent Labs    05/12/24 1726 05/29/24 1826 07/25/24 1314  PROBNP 113.0 113.0 380.0*   HbA1C: No results for input(s): HGBA1C in the last 72 hours. CBG: No  results for input(s): GLUCAP in the last 168 hours. Lipid Profile: No results for input(s): CHOL, HDL, LDLCALC, TRIG, CHOLHDL, LDLDIRECT in the last 72 hours. Thyroid  Function Tests: No results for input(s): TSH, T4TOTAL, FREET4, T3FREE, THYROIDAB in the last 72 hours. Anemia Panel: No results for input(s): VITAMINB12, FOLATE, FERRITIN, TIBC, IRON, RETICCTPCT in the last 72 hours. Sepsis Labs: Recent Labs  Lab 07/25/24 1328 07/26/24 0510 07/28/24 1521  PROCALCITON  --  0.11 <0.10  LATICACIDVEN 1.6  --   --     Recent Results (from the past 240 hours)  Blood Culture (routine x 2)     Status: None   Collection Time: 07/25/24  1:25 PM   Specimen: BLOOD RIGHT FOREARM  Result Value Ref Range Status   Specimen Description   Final    BLOOD RIGHT FOREARM Performed at [facility], [address] [place], [place] [address]    Special Requests   Final    BOTTLES DRAWN AEROBIC AND ANAEROBIC Blood Culture adequate volume Performed at [facility], [address],  [place], [place] [address]    Culture   Final    NO GROWTH 5 DAYS Performed at [facility], [address], [place], [place] [address]    Report Status 07/30/2024 FINAL  Final  Blood Culture (routine x 2)     Status: None   Collection Time: 07/25/24  1:33 PM   Specimen: BLOOD  Result Value Ref Range Status   Specimen Description   Final    BLOOD LEFT ANTECUBITAL Performed at [facility], [address], [place], [place] [address]    Special Requests   Final    BOTTLES DRAWN AEROBIC AND ANAEROBIC Blood Culture adequate volume Performed at [facility], [address], [place], [place] [address]    Culture   Final    NO GROWTH 5 DAYS Performed at [facility], [address], [place], [place] [address]    Report Status 07/30/2024 FINAL  Final  Resp panel by RT-PCR (RSV, Flu A&B, Covid) Anterior Nasal Swab     Status: None   Collection Time: 07/25/24  1:47 PM   Specimen: Anterior Nasal Swab  Result Value Ref Range Status   SARS Coronavirus 2 by RT PCR NEGATIVE NEGATIVE Final    Comment: (NOTE) SARS-CoV-2 target nucleic acids are NOT DETECTED.  The SARS-CoV-2 RNA is generally detectable in upper respiratory specimens during the acute phase of infection. The lowest concentration of SARS-CoV-2 viral copies this assay can detect is 138 copies/mL. A negative result does not preclude SARS-Cov-2 infection and should not be used as the sole basis for treatment or other patient management decisions. A negative result may occur with  improper specimen collection/handling, submission of specimen other than nasopharyngeal swab, presence of viral mutation(s) within the areas targeted by this assay, and inadequate number of viral copies(<138 copies/mL). A negative result must be combined with clinical observations, patient history, and epidemiological information. The expected result is Negative.  Fact Sheet for Patients:   bloggercourse.com  Fact Sheet for Healthcare Providers:  seriousbroker.it  This test is no t yet approved or cleared by the [place]  FDA and  has been authorized for detection and/or diagnosis of SARS-CoV-2 by FDA under an Emergency Use Authorization (EUA). This EUA will remain  in effect (meaning this test can be used) for the duration of the COVID-19 declaration under Section 564(b)(1) of the Act, 21 U.S.C.section 360bbb-3(b)(1),  unless the authorization is terminated  or revoked sooner.       Influenza A by PCR NEGATIVE NEGATIVE Final   Influenza B by PCR NEGATIVE NEGATIVE Final    Comment: (NOTE) The Xpert Xpress SARS-CoV-2/FLU/RSV plus assay is intended as an aid in the diagnosis of influenza from Nasopharyngeal swab specimens and should not be used as a sole basis for treatment. Nasal washings and aspirates are unacceptable for Xpert Xpress SARS-CoV-2/FLU/RSV testing.  Fact Sheet for Patients: bloggercourse.com  Fact Sheet for Healthcare Providers: seriousbroker.it  This test is not yet approved or cleared by the [place]  FDA and has been authorized for detection and/or diagnosis of SARS-CoV-2 by FDA under an Emergency Use Authorization (EUA). This EUA will remain in effect (meaning this test can be used) for the duration of the COVID-19 declaration under Section 564(b)(1) of the Act, 21 U.S.C. section 360bbb-3(b)(1), unless the authorization is terminated or revoked.     Resp Syncytial Virus by PCR NEGATIVE NEGATIVE Final    Comment: (NOTE) Fact Sheet for Patients: bloggercourse.com  Fact Sheet for Healthcare Providers: seriousbroker.it  This test is not yet approved or cleared by the [place]  FDA and has been authorized for detection and/or diagnosis of SARS-CoV-2 by FDA under an Emergency Use  Authorization (EUA). This EUA will remain in effect (meaning this test can be used) for the duration of the COVID-19 declaration under Section 564(b)(1) of the Act, 21 U.S.C. section 360bbb-3(b)(1), unless the authorization is terminated or revoked.  Performed at [facility], [address] [place], [place] [address]   MRSA Next Gen by PCR, Nasal     Status: None   Collection Time: 07/26/24  2:43 PM   Specimen: Nasal Mucosa; Nasal Swab  Result Value Ref Range Status   MRSA by PCR Next Gen NOT DETECTED NOT DETECTED Final    Comment: (NOTE) The GeneXpert MRSA Assay (FDA approved for NASAL specimens only), is one component of a comprehensive MRSA colonization surveillance program. It is not intended to diagnose MRSA infection nor to guide or monitor treatment for MRSA infections. Test performance is not FDA approved in patients less than 52 years old. Performed at [facility], [address], [place], [place] [address]          Radiology Studies: No results found.        Scheduled Meds:  Chlorhexidine  Gluconate Cloth  6 each Topical Daily   enoxaparin  (LOVENOX ) injection  40 mg Subcutaneous Q24H   feeding supplement  237 mL Oral BID BM   guaiFENesin   600 mg Oral BID   methylPREDNISolone  (SOLU-MEDROL ) injection  60 mg Intravenous Daily   pneumococcal 20-valent conjugate vaccine  0.5 mL Intramuscular Tomorrow-1000   senna  1 tablet Oral BID   sodium chloride  flush  10-40 mL Intracatheter Q12H   Continuous Infusions:  piperacillin -tazobactam (ZOSYN )  IV Stopped (07/31/24 0949)     LOS: 6 days    Time spent: 50 minutes    Renato Applebaum, MD [facility]   "

## 2024-07-31 NOTE — Progress Notes (Signed)
 "  NAME:  John [name], MRN:  [id number], DOB:  [birth year], LOS: 6 ADMISSION DATE:  07/25/2024,  History of Present Illness:  70 yoM with PMH significant for former smoker (quit 20-105yrs ago), metastatic small cell lung cancer., prostate cancer 2013 and urothelial carcinoma 2022, left kidney lesion s/p SBRT 04/2024, HTN, aortic regurgitation, type 1 second degree AV block, cataracts    Pt is followed by Dr. Sherrod with oncology for metastatic small cell lung cancer.  Initially dx 07/2023, s/p palliative RT completed 09/19/23, and palliative chemo with carboplatin  + etoposide  + durvalumab , currently maintained on durvalumab , last dose 12/17.  On CXR 12/30 developed bilateral lobe patchy infiltrates.  Started having some exertional SOB.  Was called in rx doxycycline that he started 1/13 and then presented to ER 1/14 due to worsening SOB.  Denies cough but reports several days ago had speck of blood but nothing since.   Denies fever, chills, cough, chest pain, sick contacts, orthopnea, N/V, dysphagia or choking episodes, or LE swelling/ pain.  Lives alone, uses a cane, and reports is otherwise independent in ADLs except for doesn't drive anymore.  Is not on home oxygen  or uses any inhalers/ nebs at baseline.    Found to be hypoxic on admit at 68% on RA, afebrile, WBC 15, PCT 0.11, SARs, flu, RSV neg, MRSA PCR neg.  CTA PE neg for PE but showing centrilobular and paraseptal emphysema with patchy consolidation and GGO bilaterally, moderate partially loculated left pleural effusion, and s/p radiation changes in upper and lower lobes bilaterally, interval enlargement of mediastinal lymph nodes, and enlarged pulmonic trunk indicative of pulmonary arterial hypertension. Admitted to [facility].  S/p empiric azithro and ctx on 1/14.  Oncology following, changed to [facility] and started on steroids started 1/15.  Pulmonary consulted 1/16 given CT findings and ongoing HHFNC of 60% and 50L.  Pt reports feeling better since admit.   Currently no SOB at rest and continues to deny cough.      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   worked up with cardiology dr hochrein and f/u prn   Nocturia 02/11/2021   Prostate CA (HCC) 2024   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   Wears glasses 02/11/2021     Significant Hospital Events: Including procedures, antibiotic start and stop dates in addition to other pertinent events     Interim History / Subjective:  07/25/24 Admit 1/15 steroids added, HHFNC 75%-80%, 50L 1/19 HF 45-60%  Objective    Blood pressure (!) 120/55, pulse 79, temperature 98 F (36.7 C), temperature source Oral, resp. rate 20, height 6' (1.829 m), weight 63.8 kg, SpO2 99%.    FiO2 (%):  [50 %-65 %] 65 %   Intake/Output Summary (Last 24 hours) at 07/31/2024 2009  Last data filed at 07/31/2024 1837 Gross per 24 hour  Intake 470 ml  Output 1550 ml  Net -1080 ml   Filed Weights   07/27/24 1822  Weight: 63.8 kg    Examination: Physical Exam General:  pleasant thin elderly male sitting in bed in NAD HEENT: MM pink/moist Neuro: Aox4, MAE CV: rr, NSR, port right chest accessed, appears euvolemic  PULM:  mild resp distress, speaking in full sentences, no wheezing GI: soft,+bs, NT Extremities: warm/dry, no LE edema  Skin: no rashes   CTA angio  01/14 1. Negative for pulmonary embolus. 2. Multilobar pneumonia. 3. 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 Evaluation for recurrent disease is challenging due to surrounding acute ground-glass and consolidation. 4. Interval enlargement of mediastinal lymph nodes may be reactive in etiology. Recommend attention on follow-up. 5. Partially loculated moderate left pleural effusion. 6. Aortic atherosclerosis (ICD10-I70.0). Coronary artery calcification. 7. Enlarged pulmonic trunk, indicative of pulmonary arterial hypertension.  Resolved problem list   Assessment and Plan  Acute hypoxic respiratory failure Metastatic small cell lung cancer GGO bilateral chemo induced pneumonitis, vs infection. Metastatic small cell lung cancer, s/p palliative chemo/ radiation currently on durvalumab  (since 08/2023), last dose 12/17 Multifocal GGO- ddx include chemo induced pneumonitis vs pneumonia vs lymphangitic spread. Procal, RVP, MRSA, strep negative.  TTE 1/16> EF 60-65%, normal RSVF, no RWMA, trivial MR, mild to mod AR P:  - cont to wean HHFNC for sat goal > 92%.   - cont solumedrol 60mg  daily, 1mg /kg for chemo induced pneumonitis, he will need a long prednisone  taper for 6 weeks with bactrim  pjp prophylaxis. -Continue zosyn  x5 days total. - ongoing pulm hygiene with IS, flutter, mucolytic, prn duonebs - I will continue to follow up   I had goals of care and patient is DNR/DNI.     Labs   CBC: Recent Labs  Lab 07/25/24 1314 07/25/24 1328 07/26/24 0510 07/30/24 0619  WBC 15.0*  --  13.2* 17.3*  NEUTROABS 13.3*  --   --  15.0*  HGB 13.7 13.9 12.2* 11.6*  HCT 42.3 41.0 37.7* 35.5*  MCV 96.6  --  96.2 95.9  PLT 240  --  211 189    Basic Metabolic Panel: Recent Labs  Lab 07/25/24 1314 07/25/24 1328 07/26/24 0510 07/30/24 0619  NA 139 139 138 139  K 4.1 4.1 4.0 4.3  CL 104 103 104 102  CO2 29  --  27 32  GLUCOSE 197* 195* 94 90  BUN 17 16 9 19   CREATININE  0.92 1.00 0.72 0.90  CALCIUM 10.3  --  9.6 9.8  MG  --   --   --  2.4   GFR: Estimated Creatinine Clearance: 60.1 mL/min (by C-G formula based on SCr of 0.9 mg/dL). Recent Labs  Lab 07/25/24 1314 07/25/24 1328 07/26/24 0510 07/28/24 1521 07/30/24 0619  PROCALCITON  --   --  0.11 <0.10  --   WBC 15.0*  --  13.2*  --  17.3*  LATICACIDVEN  --  1.6  --   --   --     Liver Function Tests: Recent Labs  Lab 07/25/24 1314  AST 27  ALT 11  ALKPHOS 123  BILITOT 0.5  PROT 7.3  ALBUMIN 3.4*   No results for input(s): LIPASE, AMYLASE in the last 168 hours. No results for input(s): AMMONIA in the last 168 hours.  ABG    Component Value Date/Time   PHART 7.47 (H) 07/25/2024 1316  PCO2ART 41 07/25/2024 1316   PO2ART 121 (H) 07/25/2024 1316   HCO3 29.8 (H) 07/25/2024 1316   TCO2 26 07/25/2024 1328   O2SAT 99.1 07/25/2024 1316     Coagulation Profile: Recent Labs  Lab 07/25/24 1314  INR 1.1    Cardiac Enzymes: No results for input(s): CKTOTAL, CKMB, CKMBINDEX, TROPONINI in the last 168 hours.  HbA1C: No results found for: HGBA1C  CBG: No results for input(s): GLUCAP in the last 168 hours.  Review of Systems:   As above   Past Medical History:  He,  has a past medical history of Aortic regurgitation, Arthritis (02/11/2021), Bladder cancer (HCC), Frequency of urination (02/11/2021), History of prostate cancer (2014), Hydrocele, Hypertension (02/11/2021), Mobitz type 1 second degree atrioventricular block (08/11/2018), Nocturia (02/11/2021), Prostate CA (HCC) (2024), Psoriasis (02/11/2021), Small cell lung cancer (HCC) (08/2023), and Wears glasses (02/11/2021).   Surgical History:   Past Surgical History:  Procedure Laterality Date   ABDOMINAL HERNIA REPAIR  1990's   BRONCHIAL BIOPSY  08/08/2023   Procedure: BRONCHIAL BIOPSIES;  Surgeon: Shelah Lamar RAMAN, MD;  Location: [facility] ENDOSCOPY;  Service: Pulmonary;;   BRONCHIAL BRUSHINGS  08/08/2023    Procedure: BRONCHIAL BRUSHINGS;  Surgeon: Shelah Lamar RAMAN, MD;  Location: [facility] ENDOSCOPY;  Service: Pulmonary;;   BRONCHIAL NEEDLE ASPIRATION BIOPSY  08/08/2023   Procedure: BRONCHIAL NEEDLE ASPIRATION BIOPSIES;  Surgeon: Shelah Lamar RAMAN, MD;  Location: [facility] ENDOSCOPY;  Service: Pulmonary;;   CYSTOSCOPY W/ RETROGRADES N/A 11/03/2015   Procedure: CYSTOSCOPY WITH RETROGRADE PYELOGRAM;  Surgeon: Belvie LITTIE Clara, MD;  Location[facility]  Service: Urology;  Laterality: N/A;   CYSTOSCOPY W/ RETROGRADES Left 01/09/2016   Procedure: CYSTOSCOPY WITH LEFT RETROGRADE PYELOGRAM;  Surgeon: Belvie LITTIE Clara, MD;  Location: [facility];  Service: Urology;  Laterality: Left;   CYSTOSCOPY W/ RETROGRADES Bilateral 05/28/2016   Procedure: CYSTOSCOPY WITH RETROGRADE PYELOGRAM;  Surgeon: Belvie LITTIE Clara, MD;  Location: [facility];  Service: Urology;  Laterality: Bilateral;   CYSTOSCOPY W/ RETROGRADES Bilateral 02/17/2021   Procedure: CYSTOSCOPY WITH RETROGRADE PYELOGRAM;  Surgeon: Rosalind Zachary NOVAK, MD;  Location: [facility];  Service: Urology;  Laterality: Bilateral;   CYSTOSCOPY W/ URETERAL STENT REMOVAL Left 01/09/2016   Procedure: CYSTOSCOPY WITH LEFT STENT EXCHANGE;  Surgeon: Belvie LITTIE Clara, MD;  Location: [facility];  Service: Urology;  Laterality: Left;   CYSTOSCOPY WITH BIOPSY N/A 05/28/2016   Procedure: CYSTOSCOPY WITH BIOPSY;  Surgeon: Belvie LITTIE Clara, MD;  Location: [facility];  Service: Urology;  Laterality: N/A;   CYSTOSCOPY WITH URETHRAL DILATATION N/A 05/28/2016   Procedure: CYSTOSCOPY WITH URETHRAL DILATATION;  Surgeon: Belvie LITTIE Clara, MD;  Location: [facility];  Service: Urology;  Laterality: N/A;   HEMOSTASIS CONTROL  08/08/2023   Procedure: HEMOSTASIS CONTROL;  Surgeon: Shelah Lamar RAMAN, MD;  Location: [facility] ENDOSCOPY;  Service: Pulmonary;;   IR IMAGING GUIDED PORT INSERTION   09/19/2023   PROSTATE BIOPSY  05/08/12   Adenocarcinoma   RADIOACTIVE SEED IMPLANT  08/01/2012   Procedure: RADIOACTIVE SEED IMPLANT;  Surgeon: Thomasine Oiler, MD;  Location: [facility];  Service: Urology;  Laterality: N/A;  73 seeds implanted no seeds found in bladder   TRANSURETHRAL RESECTION OF BLADDER TUMOR N/A 11/03/2015   Procedure: TRANSURETHRAL RESECTION OF BLADDER TUMOR (TURBT)WITH INSERTION STENT;  Surgeon: Belvie LITTIE Clara, MD;  Location: [facility];  Service: Urology;  Laterality: N/A;   TRANSURETHRAL RESECTION OF BLADDER  TUMOR N/A 02/17/2021   Procedure: TRANSURETHRAL RESECTION OF BLADDER TUMOR (TURBT);  Surgeon: Rosalind Zachary NOVAK, MD;  Location: [facility];  Service: Urology;  Laterality: N/A;  30 MINS   TRANSURETHRAL RESECTION OF BLADDER TUMOR WITH GYRUS (TURBT-GYRUS) N/A 01/09/2016   Procedure: TRANSURETHRAL RESECTION OF BLADDER TUMOR WITH GYRUS (TURBT-GYRUS);  Surgeon: Belvie LITTIE Clara, MD;  Location: [facility];  Service: Urology;  Laterality: N/A;   VIDEO BRONCHOSCOPY WITH RADIAL ENDOBRONCHIAL ULTRASOUND  08/08/2023   Procedure: VIDEO BRONCHOSCOPY WITH RADIAL ENDOBRONCHIAL ULTRASOUND;  Surgeon: Shelah Lamar RAMAN, MD;  Location: [facility] ENDOSCOPY;  Service: Pulmonary;;     Social History:   reports that he quit smoking about 9 years ago. His smoking use included cigarettes. He started smoking about 51 years ago. He has a 21 pack-year smoking history. He has never used smokeless tobacco. He reports that he does not currently use drugs. He reports that he does not drink alcohol.   Family History:  His family history includes Aneurysm in his father; Asthma in his mother and sister; Heart failure in his mother; Hyperlipidemia in his mother and sister.   Allergies Allergies[1]   Home Medications  Prior to Admission medications  Medication Sig Start Date End Date Taking? Authorizing Provider  albuterol  (VENTOLIN  HFA) 108  (90 Base) MCG/ACT inhaler Inhale 1-2 puffs into the lungs every 6 (six) hours as needed for wheezing or shortness of breath. 05/12/24  Yes Yolande Lamar BROCKS, MD  amLODipine (NORVASC) 10 MG tablet Take 10 mg by mouth daily.   Yes [provider]  APIXABAN  (ELIQUIS ) VTE STARTER PACK (10MG  AND 5MG ) Take as directed on package: start with two-5mg  tablets twice daily for 7 days. On day 8, switch to one-5mg  tablet twice daily. Patient not taking: Reported on 07/25/2024 05/12/24   Yolande Lamar BROCKS, MD  lidocaine -prilocaine  (EMLA ) cream Apply 1 Application topically as needed. Patient not taking: Reported on 07/25/2024 08/25/23   Heilingoetter, Cassandra L, PA-C  memantine  (NAMENDA ) 10 MG tablet Take 1 tablet (10 mg total) by mouth 2 (two) times daily. Patient not taking: Reported on 07/25/2024 12/07/23   Lanell Donald Stagger, PA-C  memantine  (NAMENDA ) 5 MG tablet Begin this prescription the first day of brain radiation. Week 1: take one tablet po qam. Week 2: take one tablet qam and qpm. Week 3: take two tablets qam, and one tablet po q pm. Week 4: take two tablets qam and qpm. Fill subsequent prescription q month. Patient not taking: Reported on 07/25/2024 12/07/23   Lanell Donald Stagger, PA-C  prochlorperazine  (COMPAZINE ) 10 MG tablet Take 1 tablet (10 mg total) by mouth every 6 (six) hours as needed. Patient not taking: Reported on 07/25/2024 08/25/23   Heilingoetter, Cassandra L, PA-C  psyllium (METAMUCIL SMOOTH TEXTURE) 58.6 % powder Take 1 packet by mouth 3 (three) times daily. Patient not taking: Reported on 07/25/2024 05/29/24   Kingsley, Victoria K, DO  senna-docusate (SENOKOT-S) 8.6-50 MG tablet Take 1 tablet by mouth daily. Patient not taking: Reported on 07/25/2024 05/29/24   Ellouise Richerd POUR, DO       The patient is critically ill due to Acute hypoxic resp failure.  Critical care was necessary to treat or prevent imminent or life-threatening deterioration. Critical care time was  spent by me on the following activities: development of a treatment plan with the patient and/or surrogate as well as nursing, discussions with consultants, evaluation of the patient's response to treatment, examination of the patient, obtaining a history from  the patient or surrogate, ordering and performing treatments and interventions, ordering and review of laboratory studies, ordering and review of radiographic studies, review of telemetry data including pulse oximetry, re-evaluation of patient's condition and participation in multidisciplinary rounds.   I personally spent 30 minutes providing critical care not including any separately billable procedures.  Marny Patch, MD [facility] 07/31/2024 8:09 PM                 [1] No Known Allergies  "

## 2024-08-01 ENCOUNTER — Inpatient Hospital Stay

## 2024-08-01 ENCOUNTER — Inpatient Hospital Stay (HOSPITAL_COMMUNITY)

## 2024-08-01 DIAGNOSIS — J188 Other pneumonia, unspecified organism: Secondary | ICD-10-CM | POA: Diagnosis not present

## 2024-08-01 MED ORDER — PREDNISONE 20 MG PO TABS
60.0000 mg | ORAL_TABLET | Freq: Every day | ORAL | Status: AC
  Administered 2024-08-02 – 2024-08-08 (×7): 60 mg via ORAL
  Filled 2024-08-01 (×7): qty 3

## 2024-08-01 NOTE — Progress Notes (Signed)
 "  NAME:  John [name], MRN:  [id number], DOB:  10/18/1945, LOS: 7 ADMISSION DATE:  07/25/2024,  History of Present Illness:  61 yoM with PMH significant for former smoker (quit 20-43yrs ago), metastatic small cell lung cancer., prostate cancer 2013 and urothelial carcinoma 2022, left kidney lesion s/p SBRT 04/2024, HTN, aortic regurgitation, type 1 second degree AV block, cataracts    Pt is followed by Dr. Sherrod with oncology for metastatic small cell lung cancer.  Initially dx 07/2023, s/p palliative RT completed 09/19/23, and palliative chemo with carboplatin  + etoposide  + durvalumab , currently maintained on durvalumab , last dose 12/17.  On CXR 12/30 developed bilateral lobe patchy infiltrates.  Started having some exertional SOB.  Was called in rx doxycycline that he started 1/13 and then presented to ER 1/14 due to worsening SOB.  Denies cough but reports several days ago had speck of blood but nothing since.   Denies fever, chills, cough, chest pain, sick contacts, orthopnea, N/V, dysphagia or choking episodes, or LE swelling/ pain.  Lives alone, uses a cane, and reports is otherwise independent in ADLs except for doesn't drive anymore.  Is not on home oxygen  or uses any inhalers/ nebs at baseline.    Found to be hypoxic on admit at 68% on RA, afebrile, WBC 15, PCT 0.11, SARs, flu, RSV neg, MRSA PCR neg.  CTA PE neg for PE but showing centrilobular and paraseptal emphysema with patchy consolidation and GGO bilaterally, moderate partially loculated left pleural effusion, and s/p radiation changes in upper and lower lobes bilaterally, interval enlargement of mediastinal lymph nodes, and enlarged pulmonic trunk indicative of pulmonary arterial hypertension. Admitted to [facility].  S/p empiric azithro and ctx on 1/14.  Oncology following, changed to [facility] and started on steroids started 1/15.  Pulmonary consulted 1/16 given CT findings and ongoing HHFNC of 60% and 50L.  Pt reports feeling better since admit.   Currently no SOB at rest and continues to deny cough.      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   worked up with cardiology dr hochrein and f/u prn   Nocturia 02/11/2021   Prostate CA (HCC) 2024   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   Wears glasses 02/11/2021     Significant Hospital Events: Including procedures, antibiotic start and stop dates in addition to other pertinent events     Interim History / Subjective:  07/25/24 Admit 1/15 steroids added, HHFNC 75%-80%, 50L 1/19 HF 45>60%  Objective    Blood pressure (!) 126/47, pulse 75, temperature (!) 97.4 F (36.3 C), temperature source Oral, resp. rate 18, height 6' (1.829 m), weight 63.8 kg, SpO2 94%.    FiO2 (%):  [50 %-65 %] 50 %   Intake/Output Summary (Last 24 hours) at 08/01/2024  0845 Last data filed at 08/01/2024 0600 Gross per 24 hour  Intake 540.85 ml  Output 1700 ml  Net -1159.15 ml   Filed Weights   07/27/24 1822  Weight: 63.8 kg    Examination: Physical Exam General:  pleasant thin elderly male sitting in bed in NAD HEENT: MM pink/moist Neuro: Aox4, MAE CV: rr, NSR, port right chest accessed, appears euvolemic  PULM:  mild resp distress, speaking in full sentences, no wheezing GI: soft,+bs, NT Extremities: warm/dry, no LE edema  Skin: no rashes   CTA  angio 01/14 1. Negative for pulmonary embolus. 2. Multilobar pneumonia. 3. 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 Evaluation for recurrent disease is challenging due to surrounding acute ground-glass and consolidation. 4. Interval enlargement of mediastinal lymph nodes may be reactive in etiology. Recommend attention on follow-up. 5. Partially loculated moderate left pleural effusion. 6. Aortic atherosclerosis (ICD10-I70.0). Coronary artery calcification. 7. Enlarged pulmonic trunk, indicative of pulmonary arterial hypertension.  Resolved problem list   Assessment and Plan  Acute hypoxic respiratory failure Metastatic small cell lung cancer GGO bilateral chemo induced pneumonitis, vs infection. Metastatic small cell lung cancer, s/p palliative chemo/ radiation currently on durvalumab  (since 08/2023), last dose 12/17 Multifocal GGO- ddx include chemo induced pneumonitis vs pneumonia vs lymphangitic spread.  Procal, RVP, MRSA, strep negative.   TTE 1/16> EF 60-65%, normal RSVF, no RWMA, trivial MR, mild to mod AR P:  - cont to wean HHFNC for sat goal > 92%.   - change solumedrol to prednisone  60 mg daily > he will need a 6 weeks taper (60mg  x 1 week, 50 mg x1w, 40mg x1w, 30x1w, 20mg x1w, 10mg  x1w)with bactrim  prophylaxis (1DS tablet Monday, Wednesday and Friday) -Zosyn  end date 01/22 - ongoing pulm hygiene with IS, flutter, mucolytic, prn duonebs -PT/OT   I had goals of care and patient is DNR/DNI.     Labs   CBC: Recent Labs  Lab 07/25/24 1314 07/25/24 1328 07/26/24 0510 07/30/24 0619  WBC 15.0*  --  13.2* 17.3*  NEUTROABS 13.3*  --   --  15.0*  HGB 13.7 13.9 12.2* 11.6*  HCT 42.3 41.0 37.7* 35.5*  MCV 96.6  --  96.2 95.9  PLT 240  --  211 189    Basic Metabolic Panel: Recent Labs  Lab 07/25/24 1314 07/25/24 1328 07/26/24 0510 07/30/24 0619  NA 139 139 138 139  K 4.1 4.1 4.0 4.3  CL 104 103 104 102  CO2 29  --  27 32  GLUCOSE 197* 195* 94  90  BUN 17 16 9 19   CREATININE 0.92 1.00 0.72 0.90  CALCIUM 10.3  --  9.6 9.8  MG  --   --   --  2.4   GFR: Estimated Creatinine Clearance: 60.1 mL/min (by C-G formula based on SCr of 0.9 mg/dL). Recent Labs  Lab 07/25/24 1314 07/25/24 1328 07/26/24 0510 07/28/24 1521 07/30/24 0619  PROCALCITON  --   --  0.11 <0.10  --   WBC 15.0*  --  13.2*  --  17.3*  LATICACIDVEN  --  1.6  --   --   --     Liver Function Tests: Recent Labs  Lab 07/25/24 1314  AST 27  ALT 11  ALKPHOS 123  BILITOT 0.5  PROT 7.3  ALBUMIN 3.4*   No results for input(s): LIPASE, AMYLASE in the last 168 hours. No results for input(s): AMMONIA in the last 168 hours.  ABG    Component Value  Date/Time   PHART 7.47 (H) 07/25/2024 1316   PCO2ART 41 07/25/2024 1316   PO2ART 121 (H) 07/25/2024 1316   HCO3 29.8 (H) 07/25/2024 1316   TCO2 26 07/25/2024 1328   O2SAT 99.1 07/25/2024 1316     Coagulation Profile: Recent Labs  Lab 07/25/24 1314  INR 1.1    Cardiac Enzymes: No results for input(s): CKTOTAL, CKMB, CKMBINDEX, TROPONINI in the last 168 hours.  HbA1C: No results found for: HGBA1C  CBG: No results for input(s): GLUCAP in the last 168 hours.  Review of Systems:   As above   Past Medical History:  He,  has a past medical history of Aortic regurgitation, Arthritis (02/11/2021), Bladder cancer (HCC), Frequency of urination (02/11/2021), History of prostate cancer (2014), Hydrocele, Hypertension (02/11/2021), Mobitz type 1 second degree atrioventricular block (08/11/2018), Nocturia (02/11/2021), Prostate CA (HCC) (2024), Psoriasis (02/11/2021), Small cell lung cancer (HCC) (08/2023), and Wears glasses (02/11/2021).   Surgical History:   Past Surgical History:  Procedure Laterality Date   ABDOMINAL HERNIA REPAIR  1990's   BRONCHIAL BIOPSY  08/08/2023   Procedure: BRONCHIAL BIOPSIES;  Surgeon: Shelah Lamar RAMAN, MD;  Location: [facility] ENDOSCOPY;  Service: Pulmonary;;    BRONCHIAL BRUSHINGS  08/08/2023   Procedure: BRONCHIAL BRUSHINGS;  Surgeon: Shelah Lamar RAMAN, MD;  Location: [facility] ENDOSCOPY;  Service: Pulmonary;;   BRONCHIAL NEEDLE ASPIRATION BIOPSY  08/08/2023   Procedure: BRONCHIAL NEEDLE ASPIRATION BIOPSIES;  Surgeon: Shelah Lamar RAMAN, MD;  Location: [facility] ENDOSCOPY;  Service: Pulmonary;;   CYSTOSCOPY W/ RETROGRADES N/A 11/03/2015   Procedure: CYSTOSCOPY WITH RETROGRADE PYELOGRAM;  Surgeon: Belvie LITTIE Clara, MD;  Location: [facility];  Service: Urology;  Laterality: N/A;   CYSTOSCOPY W/ RETROGRADES Left 01/09/2016   Procedure: CYSTOSCOPY WITH LEFT RETROGRADE PYELOGRAM;  Surgeon: Belvie LITTIE Clara, MD;  Location: [facility];  Service: Urology;  Laterality: Left;   CYSTOSCOPY W/ RETROGRADES Bilateral 05/28/2016   Procedure: CYSTOSCOPY WITH RETROGRADE PYELOGRAM;  Surgeon: Belvie LITTIE Clara, MD;  Location: [facility];  Service: Urology;  Laterality: Bilateral;   CYSTOSCOPY W/ RETROGRADES Bilateral 02/17/2021   Procedure: CYSTOSCOPY WITH RETROGRADE PYELOGRAM;  Surgeon: Rosalind Zachary NOVAK, MD;  Location: [facility];  Service: Urology;  Laterality: Bilateral;   CYSTOSCOPY W/ URETERAL STENT REMOVAL Left 01/09/2016   Procedure: CYSTOSCOPY WITH LEFT STENT EXCHANGE;  Surgeon: Belvie LITTIE Clara, MD;  Location: [facility];  Service: Urology;  Laterality: Left;   CYSTOSCOPY WITH BIOPSY N/A 05/28/2016   Procedure: CYSTOSCOPY WITH BIOPSY;  Surgeon: Belvie LITTIE Clara, MD;  Location: [facility];  Service: Urology;  Laterality: N/A;   CYSTOSCOPY WITH URETHRAL DILATATION N/A 05/28/2016   Procedure: CYSTOSCOPY WITH URETHRAL DILATATION;  Surgeon: Belvie LITTIE Clara, MD;  Location: [facility];  Service: Urology;  Laterality: N/A;   HEMOSTASIS CONTROL  08/08/2023   Procedure: HEMOSTASIS CONTROL;  Surgeon: Shelah Lamar RAMAN, MD;  Location: [facility] ENDOSCOPY;  Service: Pulmonary;;   IR  IMAGING GUIDED PORT INSERTION  09/19/2023   PROSTATE BIOPSY  05/08/12   Adenocarcinoma   RADIOACTIVE SEED IMPLANT  08/01/2012   Procedure: RADIOACTIVE SEED IMPLANT;  Surgeon: Thomasine Oiler, MD;  Location: [facility];  Service: Urology;  Laterality: N/A;  73 seeds implanted no seeds found in bladder   TRANSURETHRAL RESECTION OF BLADDER TUMOR N/A 11/03/2015   Procedure: TRANSURETHRAL RESECTION OF BLADDER TUMOR (TURBT)WITH INSERTION STENT;  Surgeon: Belvie LITTIE Clara, MD;  Location: [facility];  Service:  Urology;  Laterality: N/A;   TRANSURETHRAL RESECTION OF BLADDER TUMOR N/A 02/17/2021   Procedure: TRANSURETHRAL RESECTION OF BLADDER TUMOR (TURBT);  Surgeon: Rosalind Zachary NOVAK, MD;  Location: [facility];  Service: Urology;  Laterality: N/A;  30 MINS   TRANSURETHRAL RESECTION OF BLADDER TUMOR WITH GYRUS (TURBT-GYRUS) N/A 01/09/2016   Procedure: TRANSURETHRAL RESECTION OF BLADDER TUMOR WITH GYRUS (TURBT-GYRUS);  Surgeon: Belvie LITTIE Clara, MD;  Location: [facility];  Service: Urology;  Laterality: N/A;   VIDEO BRONCHOSCOPY WITH RADIAL ENDOBRONCHIAL ULTRASOUND  08/08/2023   Procedure: VIDEO BRONCHOSCOPY WITH RADIAL ENDOBRONCHIAL ULTRASOUND;  Surgeon: Shelah Lamar RAMAN, MD;  Location: [facility] ENDOSCOPY;  Service: Pulmonary;;     Social History:   reports that he quit smoking about 9 years ago. His smoking use included cigarettes. He started smoking about 51 years ago. He has a 21 pack-year smoking history. He has never used smokeless tobacco. He reports that he does not currently use drugs. He reports that he does not drink alcohol.   Family History:  His family history includes Aneurysm in his father; Asthma in his mother and sister; Heart failure in his mother; Hyperlipidemia in his mother and sister.   Allergies Allergies[1]   Home Medications  Prior to Admission medications  Medication Sig Start Date End Date Taking? Authorizing Provider   albuterol  (VENTOLIN  HFA) 108 (90 Base) MCG/ACT inhaler Inhale 1-2 puffs into the lungs every 6 (six) hours as needed for wheezing or shortness of breath. 05/12/24  Yes Yolande Lamar BROCKS, MD  amLODipine (NORVASC) 10 MG tablet Take 10 mg by mouth daily.   Yes [provider]  APIXABAN  (ELIQUIS ) VTE STARTER PACK (10MG  AND 5MG ) Take as directed on package: start with two-5mg  tablets twice daily for 7 days. On day 8, switch to one-5mg  tablet twice daily. Patient not taking: Reported on 07/25/2024 05/12/24   Yolande Lamar BROCKS, MD  lidocaine -prilocaine  (EMLA ) cream Apply 1 Application topically as needed. Patient not taking: Reported on 07/25/2024 08/25/23   Heilingoetter, Cassandra L, PA-C  memantine  (NAMENDA ) 10 MG tablet Take 1 tablet (10 mg total) by mouth 2 (two) times daily. Patient not taking: Reported on 07/25/2024 12/07/23   Lanell Donald Stagger, PA-C  memantine  (NAMENDA ) 5 MG tablet Begin this prescription the first day of brain radiation. Week 1: take one tablet po qam. Week 2: take one tablet qam and qpm. Week 3: take two tablets qam, and one tablet po q pm. Week 4: take two tablets qam and qpm. Fill subsequent prescription q month. Patient not taking: Reported on 07/25/2024 12/07/23   Lanell Donald Stagger, PA-C  prochlorperazine  (COMPAZINE ) 10 MG tablet Take 1 tablet (10 mg total) by mouth every 6 (six) hours as needed. Patient not taking: Reported on 07/25/2024 08/25/23   Heilingoetter, Cassandra L, PA-C  psyllium (METAMUCIL SMOOTH TEXTURE) 58.6 % powder Take 1 packet by mouth 3 (three) times daily. Patient not taking: Reported on 07/25/2024 05/29/24   Kingsley, Victoria K, DO  senna-docusate (SENOKOT-S) 8.6-50 MG tablet Take 1 tablet by mouth daily. Patient not taking: Reported on 07/25/2024 05/29/24   Ellouise Richerd POUR, DO       The patient is critically ill due to Acute hypoxic resp failure.  Critical care was necessary to treat or prevent imminent or life-threatening  deterioration. Critical care time was spent by me on the following activities: development of a treatment plan with the patient and/or surrogate as well as nursing, discussions with consultants, evaluation of the patient's response  to treatment, examination of the patient, obtaining a history from the patient or surrogate, ordering and performing treatments and interventions, ordering and review of laboratory studies, ordering and review of radiographic studies, review of telemetry data including pulse oximetry, re-evaluation of patient's condition and participation in multidisciplinary rounds.   I personally spent 30 minutes providing critical care not including any separately billable procedures.  Marny Patch, MD [facility] 08/01/2024 8:45 AM                 [1] No Known Allergies  "

## 2024-08-01 NOTE — Progress Notes (Signed)
 Physical Therapy Treatment Patient Details Name: John [name] MRN: [id number] DOB: July 04, 1946 Today's Date: 08/01/2024   History of Present Illness John [name] is a 79 yr old male admitted to the hospital with shortness of breath on 07/25/24. He was found to have acute respiratory failure with hypoxia and multi-focal PNA requiring heated high flow O2. PMH: current small cell lung cancer s/p brachytherapy, bladder CA, HTN, aortic regurgitation, arthritis, psoriasis    PT Comments  Pt on 40 L HHFNC but eager for PT and cleared by MD for therapy as tolerated.  Pt tolerated therapy well with frequent rest breaks.  He was able to stand and take a few steps to the chair with min A.  Sats were 88-90% with rest and at lowest with activity 85% but recovering in <1 minute.  All other VSS.  Cont POC with Patient will benefit from continued inpatient follow up therapy, <3 hours/day at d/c.     If plan is discharge home, recommend the following: A lot of help with walking and/or transfers;A little help with bathing/dressing/bathroom;Assistance with cooking/housework;Assist for transportation;Help with stairs or ramp for entrance   Can travel by private vehicle     Yes  Equipment Recommendations  Rolling walker (2 wheels)    Recommendations for Other Services       Precautions / Restrictions Precautions Precautions: Fall Precaution/Restrictions Comments: watch O2 on HHFNC     Mobility  Bed Mobility Overal bed mobility: Needs Assistance Bed Mobility: Supine to Sit     Supine to sit: Contact guard, Used rails          Transfers Overall transfer level: Needs assistance Equipment used: Rolling walker (2 wheels) Transfers: Sit to/from Stand Sit to Stand: Min assist, From elevated surface   Step pivot transfers: Min assist       General transfer comment: STS x 2 during session from elevated bed with min A and increased time to rise.  Performed step pivot with RW to recliner.     Ambulation/Gait               General Gait Details: Bed to recliner only secondary to O2 needs   Stairs             Wheelchair Mobility     Tilt Bed    Modified Rankin (Stroke Patients Only)       Balance Overall balance assessment: Needs assistance Sitting-balance support: No upper extremity supported, Feet supported Sitting balance-Leahy Scale: Good     Standing balance support: Bilateral upper extremity supported Standing balance-Leahy Scale: Poor Standing balance comment: Needs RW and CGA                            Communication    Cognition Arousal: Alert Behavior During Therapy: WFL for tasks assessed/performed   PT - Cognitive impairments: No apparent impairments                       PT - Cognition Comments: Eager to mobilize        Licensed Conveyancer Exercises - Lower Extremity Ankle Circles/Pumps: AROM, Both, 10 reps, Supine Long Arc Quad: AROM, Both, 5 reps, Seated Heel Slides: AROM, Both, 5 reps, Supine Hip ABduction/ADduction: AROM, Both, 5 reps, Supine Other Exercises Other Exercises: Elbow flex x 5 AROM bil; Shoulder Flexion x 5 AROM on L and AAROM on R    General Comments General  comments (skin integrity, edema, etc.): Pt on 40 L HHFNC wtih sats 88-90% at rest.  Started wtih bed exercises wtih sats maintaining 88-89%, progressed to EOB with sats maintainint >87-90%, progressed to standing and sats down to 85% but recovered with 1 min rest.  Able to progress to chair transfers with sats 85% during transfer and 88-90% rest.  Pt tolerated well and felt good to be OOB.      Pertinent Vitals/Pain Pain Assessment Pain Assessment: No/denies pain    Home Living                          Prior Function            PT Goals (current goals can now be found in the care plan section) Progress towards PT goals: Progressing toward goals    Frequency    Min 3X/week      PT Plan       Co-evaluation              AM-PAC PT 6 Clicks Mobility   Outcome Measure  Help needed turning from your back to your side while in a flat bed without using bedrails?: None Help needed moving from lying on your back to sitting on the side of a flat bed without using bedrails?: A Little Help needed moving to and from a bed to a chair (including a wheelchair)?: A Little Help needed standing up from a chair using your arms (e.g., wheelchair or bedside chair)?: A Little Help needed to walk in hospital room?: Total Help needed climbing 3-5 steps with a railing? : Total 6 Click Score: 15    End of Session Equipment Utilized During Treatment: Gait belt Activity Tolerance: Patient tolerated treatment well Patient left: in chair;with call bell/phone within reach;with chair alarm set Nurse Communication: Mobility status PT Visit Diagnosis: Difficulty in walking, not elsewhere classified (R26.2)     Time: 8743-8676 PT Time Calculation (min) (ACUTE ONLY): 27 min  Charges:    $Therapeutic Exercise: 8-22 mins $Therapeutic Activity: 8-22 mins PT General Charges $$ ACUTE PT VISIT: 1 Visit                     Benjiman, PT Acute Rehab Services [facility] [phone]    Benjiman VEAR [place] 08/01/2024, 2:05 PM

## 2024-08-01 NOTE — Plan of Care (Signed)

## 2024-08-01 NOTE — Progress Notes (Incomplete)
 Bactrim 

## 2024-08-01 NOTE — Progress Notes (Addendum)
 " PROGRESS NOTE    John [name]  FMW:[id number] DOB: [birth year] DOA: 07/25/2024 PCP: Practice, [facility]   Brief Narrative: 79 y.o. 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 Sherrod.  Underwent radiation in the past.  Received last dose of immunotherapy with durvalumab  12/16. 12/30, he had a chest x-ray done which resulted 10 days later on 1/10 to show bilateral pneumonia.  Patient continued to have symptoms at that time but then suddenly started having shortness of breath, cough and decreased exercise tolerance.SABRA  He does not have any supplemental oxygen  at home. 1/11, he called Dr. Jeannett office and was called in a prescription for doxycycline which he started 1/13.   1/14, patient felt significantly short of breath and hence presented to the ED   In the ED, patient is afebrile, heart rate in 100s, blood pressure in 110s, initially very hypoxic at 68% on room air and required 15 L oxygen  by nasal cannula.   VBG with pH 7.47, pCO2 41, O2 sat 99 CBC with WC count 15, lactic acid normal, CMP unremarkable Respiratory virus panel negative   CT angio chest was negative for pulm embolism but  -showed patchy consolidation and ground glass bilaterally suggestive of multifocal pneumonia. -partially loculated moderate left pleural effusion. -Enlarged pulmonic trunk, indicative of pulmonary arterial hypertension.   Blood culture collected Patient was started on IV Rocephin , azithromycin  Admitted to [facility]   Subsequently, patient's oxygen  requirement worsened significantly up to 50 L heated high flow nasal cannula. Patient remains in stepdown unit closely followed by pulmonary.     Assessment & Plan:    Principal Problem:   Multifocal pneumonia Active Problems:   History of lung cancer   Shortness of breath   Sepsis (HCC)  Acute respiratory failure with hypoxia Multifocal infiltrates Presented with progressively worsening shortness of breath. No fever, WBC count elevated to 15, lactic acid normal, procalcitonin level normal CT scan ruled out PE, showed multifocal infiltrates read as pneumonia Blood cultures, negative so far With a suspicion of pneumonia, patient was given empiric antibiotic coverage with IV Rocephin , azithromycin   1/16, Zosyn  added 1/15, discussed with oncologist Dr. Sherrod.  He recommended prednisone  1 g/kg given at for 7 days. Now on IV Solu-Medrol  60 mg daily. 1/16, oxygenation worsened.  Transferred to stepdown.  Remains on 50 L heated high flow.  On high-dose IV steroids.  Zosyn  added.  Continue chest pressure therapy.  Mobilize with PT OT.  Remains in poor overall condition.  Now DNR/DNI. 1/21 patient reports having a better night.  O2 requirement came down to 40 L from 50 L.  Starting prednisone  60 mg 08/02/2024 by tapering 10 mg a week. Continue Zosyn . Chest xray 1/21- Multifocal bilateral airspace disease with right apical sparing, small left pleural effusion, and mild elevation of the left hemidiaphragm, without significant interval change.   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 Sherrod.  Underwent radiation in the past.  Received last dose of immunotherapy with durvalumab  12/16. Oncology following   Hypertension H/o moderate aortic regurgitation CT angio showed enlarged pulmonary trunk indicating pulmonary artery hypertension Clinically does not look volume overloaded. Last echo  from 2018 had shown moderate AI.  Repeat echo report pending PTA meds- amlodipine 10 mg daily.  Currently on hold.  Continue to monitor blood pressure.   H/o prostate  cancer s/p brachytherapy H/o bladder cancer   Estimated body mass index is 19.08 kg/m as calculated from the following:   Height as of this encounter: 6' (1.829 m).   Weight as of this encounter: 63.8 kg.  DVT prophylaxis: Lovenox   code Status: DNR/DNI Family Communication: None at bedside  disposition Plan:  Status is: Inpatient Remains inpatient appropriate because: Acute illness   Consultants: Pulmonologist and oncologist  Procedures: None  Antimicrobials: Zosyn   Subjective: Patient seen resting in bed had a better night feels improved compared to yesterday currently on 40 L  Objective: Vitals:   08/01/24 0500 08/01/24 0600 08/01/24 0700 08/01/24 0751  BP: (!) 137/59 (!) 114/50 (!) 126/47   Pulse: 79 70 75   Resp: (!) 21 (!) 24 18   Temp: (!) 97.4 F (36.3 C)   (!) 97.4 F (36.3 C)  TempSrc: Oral   Oral  SpO2: 91% 95% 94%   Weight:      Height:        Intake/Output Summary (Last 24 hours) at 08/01/2024 9095 Last data filed at 08/01/2024 0600 Gross per 24 hour  Intake 300.85 ml  Output 1700 ml  Net -1399.15 ml   Filed Weights   07/27/24 1822  Weight: 63.8 kg    Examination:  General exam: Appears in mild distress Respiratory system:  Respiratory effort normal.  No wheezing rhonchi Cardiovascular system: irregular Gastrointestinal system: Abdomen is nondistended, soft and nontender. No organomegaly or masses felt. Normal bowel sounds heard. Central nervous system: Alert and oriented. No focal neurological deficits. Extremities: Symmetric 5 x 5 power.   Data Reviewed: I have personally reviewed following labs and imaging studies  CBC: Recent Labs  Lab 07/25/24 1314 07/25/24 1328 07/26/24 0510 07/30/24 0619  WBC 15.0*  --  13.2* 17.3*  NEUTROABS 13.3*  --   --  15.0*  HGB 13.7 13.9 12.2* 11.6*  HCT 42.3 41.0 37.7* 35.5*  MCV 96.6  --  96.2 95.9  PLT 240  --  211 189   Basic Metabolic Panel: Recent Labs  Lab 07/25/24 1314 07/25/24 1328  07/26/24 0510 07/30/24 0619  NA 139 139 138 139  K 4.1 4.1 4.0 4.3  CL 104 103 104 102  CO2 29  --  27 32  GLUCOSE 197* 195* 94 90  BUN 17 16 9 19   CREATININE 0.92 1.00 0.72 0.90  CALCIUM 10.3  --  9.6 9.8  MG  --   --   --  2.4   GFR: Estimated Creatinine Clearance: 60.1 mL/min (by C-G formula based on SCr of 0.9 mg/dL). Liver Function Tests: Recent Labs  Lab 07/25/24 1314  AST 27  ALT 11  ALKPHOS 123  BILITOT 0.5  PROT 7.3  ALBUMIN 3.4*   No results for input(s): LIPASE, AMYLASE in the last 168 hours. No results for input(s): AMMONIA in the last 168 hours. Coagulation Profile: Recent Labs  Lab 07/25/24 1314  INR 1.1   Cardiac Enzymes: No results for input(s): CKTOTAL, CKMB, CKMBINDEX, TROPONINI in the last 168 hours. BNP (last 3 results) Recent Labs    05/12/24 1726 05/29/24 1826 07/25/24 1314  PROBNP 113.0 113.0 380.0*   HbA1C: No results for input(s): HGBA1C in the last 72 hours. CBG: No results for input(s): GLUCAP in the last 168 hours. Lipid  Profile: No results for input(s): CHOL, HDL, LDLCALC, TRIG, CHOLHDL, LDLDIRECT in the last 72 hours. Thyroid  Function Tests: No results for input(s): TSH, T4TOTAL, FREET4, T3FREE, THYROIDAB in the last 72 hours. Anemia Panel: No results for input(s): VITAMINB12, FOLATE, FERRITIN, TIBC, IRON, RETICCTPCT in the last 72 hours. Sepsis Labs: Recent Labs  Lab 07/25/24 1328 07/26/24 0510 07/28/24 1521  PROCALCITON  --  0.11 <0.10  LATICACIDVEN 1.6  --   --     Recent Results (from the past 240 hours)  Blood Culture (routine x 2)     Status: None   Collection Time: 07/25/24  1:25 PM   Specimen: BLOOD RIGHT FOREARM  Result Value Ref Range Status   Specimen Description   Final    BLOOD RIGHT FOREARM Performed at [facility], [address] [place], [place] [address]    Special Requests   Final    BOTTLES DRAWN AEROBIC AND ANAEROBIC Blood Culture  adequate volume Performed at [facility], [address] [place], [place] [address]    Culture   Final    NO GROWTH 5 DAYS Performed at [facility], [address], [place], [place] [address]    Report Status 07/30/2024 FINAL  Final  Blood Culture (routine x 2)     Status: None   Collection Time: 07/25/24  1:33 PM   Specimen: BLOOD  Result Value Ref Range Status   Specimen Description   Final    BLOOD LEFT ANTECUBITAL Performed at [facility], [address], [place], [place] [address]    Special Requests   Final    BOTTLES DRAWN AEROBIC AND ANAEROBIC Blood Culture adequate volume Performed at [facility], [address] [place], [place] [address]    Culture   Final    NO GROWTH 5 DAYS Performed at [facility], [address] [place], [place] [address]    Report Status 07/30/2024 FINAL  Final  Resp panel by RT-PCR (RSV, Flu A&B, Covid) Anterior Nasal Swab     Status: None   Collection Time: 07/25/24  1:47 PM   Specimen: Anterior Nasal Swab  Result Value Ref Range Status   SARS Coronavirus 2 by RT PCR NEGATIVE NEGATIVE Final    Comment: (NOTE) SARS-CoV-2 target nucleic acids are NOT DETECTED.  The SARS-CoV-2 RNA is generally detectable in upper respiratory specimens during the acute phase of infection. The lowest concentration of SARS-CoV-2 viral copies this assay can detect is 138 copies/mL. A negative result does not preclude SARS-Cov-2 infection and should not be used as the sole basis for treatment or other patient management decisions. A negative result may occur with  improper specimen collection/handling, submission of specimen other than nasopharyngeal swab, presence of viral mutation(s) within the areas targeted by this assay, and inadequate number of viral copies(<138 copies/mL). A negative result must be combined with clinical observations, patient history, and  epidemiological information. The expected result is Negative.  Fact Sheet for Patients:  bloggercourse.com  Fact Sheet for Healthcare Providers:  seriousbroker.it  This test is no t yet approved or cleared by the [place]  FDA and  has been authorized for detection and/or diagnosis of SARS-CoV-2 by FDA under an Emergency Use Authorization (EUA). This EUA will remain  in effect (meaning this test can be used) for the duration of the COVID-19 declaration under Section 564(b)(1) of the Act, 21 U.S.C.section 360bbb-3(b)(1), unless the authorization is terminated  or revoked sooner.  Influenza A by PCR NEGATIVE NEGATIVE Final   Influenza B by PCR NEGATIVE NEGATIVE Final    Comment: (NOTE) The Xpert Xpress SARS-CoV-2/FLU/RSV plus assay is intended as an aid in the diagnosis of influenza from Nasopharyngeal swab specimens and should not be used as a sole basis for treatment. Nasal washings and aspirates are unacceptable for Xpert Xpress SARS-CoV-2/FLU/RSV testing.  Fact Sheet for Patients: bloggercourse.com  Fact Sheet for Healthcare Providers: seriousbroker.it  This test is not yet approved or cleared by the [place]  FDA and has been authorized for detection and/or diagnosis of SARS-CoV-2 by FDA under an Emergency Use Authorization (EUA). This EUA will remain in effect (meaning this test can be used) for the duration of the COVID-19 declaration under Section 564(b)(1) of the Act, 21 U.S.C. section 360bbb-3(b)(1), unless the authorization is terminated or revoked.     Resp Syncytial Virus by PCR NEGATIVE NEGATIVE Final    Comment: (NOTE) Fact Sheet for Patients: bloggercourse.com  Fact Sheet for Healthcare Providers: seriousbroker.it  This test is not yet approved or cleared by the [place]  FDA and has been  authorized for detection and/or diagnosis of SARS-CoV-2 by FDA under an Emergency Use Authorization (EUA). This EUA will remain in effect (meaning this test can be used) for the duration of the COVID-19 declaration under Section 564(b)(1) of the Act, 21 U.S.C. section 360bbb-3(b)(1), unless the authorization is terminated or revoked.  Performed at [facility], [address] [place], [place] [address]   MRSA Next Gen by PCR, Nasal     Status: None   Collection Time: 07/26/24  2:43 PM   Specimen: Nasal Mucosa; Nasal Swab  Result Value Ref Range Status   MRSA by PCR Next Gen NOT DETECTED NOT DETECTED Final    Comment: (NOTE) The GeneXpert MRSA Assay (FDA approved for NASAL specimens only), is one component of a comprehensive MRSA colonization surveillance program. It is not intended to diagnose MRSA infection nor to guide or monitor treatment for MRSA infections. Test performance is not FDA approved in patients less than 26 years old. Performed at [facility], [address], [place], [place] [address]          Radiology Studies: DG CHEST PORT 1 VIEW Result Date: 08/01/2024 EXAM: 1 VIEW XRAY OF THE CHEST 08/01/2024 05:28:00 AM COMPARISON: Portable chest 07/29/24. CLINICAL HISTORY: FINDINGS: LINES, TUBES AND DEVICES: Right chest IJ approach port catheter again terminates in the upper right atrium. Multiple overlying telemetry leads. LUNGS AND PLEURA: Multifocal bilateral airspace disease with right apical sparing continues to be seen with a small left pleural effusion. Overall aeration seems unchanged. HEART AND MEDIASTINUM: Stable mediastinum with calcification in the transverse aorta. The cardiac size is normal. BONES AND SOFT TISSUES: There is slight thoracic dextroscoliosis with no new osseous findings. Mild elevation of left hemidiaphragm. IMPRESSION: 1. Multifocal bilateral airspace disease with right apical sparing, small left pleural  effusion, and mild elevation of the left hemidiaphragm, without significant interval change. Electronically signed by: Francis Quam MD 08/01/2024 05:53 AM EST RP Workstation: [id number]     Scheduled Meds:  Chlorhexidine  Gluconate Cloth  6 each Topical Daily   enoxaparin  (LOVENOX ) injection  40 mg Subcutaneous Q24H   feeding supplement  237 mL Oral BID BM   guaiFENesin   600 mg Oral BID   pneumococcal 20-valent conjugate vaccine  0.5 mL Intramuscular Tomorrow-1000   [START ON 08/02/2024] predniSONE   60 mg Oral Q breakfast   senna  1 tablet Oral  BID   sodium chloride  flush  10-40 mL Intracatheter Q12H   Continuous Infusions:  piperacillin -tazobactam (ZOSYN )  IV 12.5 mL/hr at 08/01/24 0600     LOS: 7 days     Almarie KANDICE Hoots, MD 08/01/2024, 9:04 AM   "

## 2024-08-01 NOTE — Progress Notes (Signed)
" °   08/01/24 2054  Respiratory Assessment  Assessment Type Assess only  Respiratory Pattern Regular;Unlabored  Chest Assessment Chest expansion symmetrical  Cough None  Sputum Amount None  Bilateral Breath Sounds Clear;Diminished  R Upper  Breath Sounds Clear  L Upper Breath Sounds Clear  R Lower Breath Sounds Clear;Diminished  L Lower Breath Sounds Clear;Diminished  Oxygen  Therapy/Pulse Ox  O2 Device HHFNC  O2 Therapy Oxygen  humidified  O2 Flow Rate (L/min) 40 L/min  FiO2 (%) 50 %  Heater temperature 87.8 F (31 C)    "

## 2024-08-02 DIAGNOSIS — J188 Other pneumonia, unspecified organism: Secondary | ICD-10-CM | POA: Diagnosis not present

## 2024-08-02 LAB — COMPREHENSIVE METABOLIC PANEL WITH GFR
ALT: 44 U/L (ref 0–44)
AST: 31 U/L (ref 15–41)
Albumin: 3 g/dL — ABNORMAL LOW (ref 3.5–5.0)
Alkaline Phosphatase: 105 U/L (ref 38–126)
Anion gap: 9 (ref 5–15)
BUN: 19 mg/dL (ref 8–23)
CO2: 30 mmol/L (ref 22–32)
Calcium: 9.6 mg/dL (ref 8.9–10.3)
Chloride: 104 mmol/L (ref 98–111)
Creatinine, Ser: 0.95 mg/dL (ref 0.61–1.24)
GFR, Estimated: >60 mL/min
Glucose, Bld: 90 mg/dL (ref 70–99)
Potassium: 4.1 mmol/L (ref 3.5–5.1)
Sodium: 142 mmol/L (ref 135–145)
Total Bilirubin: 0.7 mg/dL (ref 0.0–1.2)
Total Protein: 6 g/dL — ABNORMAL LOW (ref 6.5–8.1)

## 2024-08-02 LAB — CBC
HCT: 38.6 % — ABNORMAL LOW (ref 39.0–52.0)
Hemoglobin: 12 g/dL — ABNORMAL LOW (ref 13.0–17.0)
MCH: 30.5 pg (ref 26.0–34.0)
MCHC: 31.1 g/dL (ref 30.0–36.0)
MCV: 98.2 fL (ref 80.0–100.0)
Platelets: 198 K/uL (ref 150–400)
RBC: 3.93 MIL/uL — ABNORMAL LOW (ref 4.22–5.81)
RDW: 12.8 % (ref 11.5–15.5)
WBC: 15.7 K/uL — ABNORMAL HIGH (ref 4.0–10.5)
nRBC: 0.1 % (ref 0.0–0.2)

## 2024-08-02 MED ORDER — PREDNISONE 10 MG PO TABS: 10.0000 mg | ORAL_TABLET | Freq: Every day | ORAL | Status: AC

## 2024-08-02 MED ORDER — PREDNISONE 20 MG PO TABS
40.0000 mg | ORAL_TABLET | Freq: Every day | ORAL | Status: AC
  Administered 2024-08-16 – 2024-08-17 (×2): 40 mg via ORAL
  Filled 2024-08-02 (×2): qty 2

## 2024-08-02 MED ORDER — SULFAMETHOXAZOLE-TRIMETHOPRIM 800-160 MG PO TABS
1.0000 | ORAL_TABLET | ORAL | Status: AC
  Administered 2024-08-03 – 2024-08-17 (×7): 1 via ORAL
  Filled 2024-08-02 (×8): qty 1

## 2024-08-02 MED ORDER — PREDNISONE 20 MG PO TABS: 30.0000 mg | ORAL_TABLET | Freq: Every day | ORAL | Status: AC

## 2024-08-02 MED ORDER — PREDNISONE 20 MG PO TABS
50.0000 mg | ORAL_TABLET | Freq: Every day | ORAL | Status: AC
  Administered 2024-08-09 – 2024-08-15 (×7): 50 mg via ORAL
  Filled 2024-08-02 (×7): qty 1

## 2024-08-02 MED ORDER — PREDNISONE 20 MG PO TABS: 20.0000 mg | ORAL_TABLET | Freq: Every day | ORAL | Status: AC

## 2024-08-02 NOTE — Progress Notes (Signed)
 "  NAME:  John [name], MRN:  [id number], DOB:  08-16-1945, LOS: 8 ADMISSION DATE:  07/25/2024,  History of Present Illness:  55 yoM with PMH significant for former smoker (quit 20-69yrs ago), metastatic small cell lung cancer., prostate cancer 2013 and urothelial carcinoma 2022, left kidney lesion s/p SBRT 04/2024, HTN, aortic regurgitation, type 1 second degree AV block, cataracts    Pt is followed by Dr. Sherrod with oncology for metastatic small cell lung cancer.  Initially dx 07/2023, s/p palliative RT completed 09/19/23, and palliative chemo with carboplatin  + etoposide  + durvalumab , currently maintained on durvalumab , last dose 12/17.  On CXR 12/30 developed bilateral lobe patchy infiltrates.  Started having some exertional SOB.  Was called in rx doxycycline that he started 1/13 and then presented to ER 1/14 due to worsening SOB.  Denies cough but reports several days ago had speck of blood but nothing since.   Denies fever, chills, cough, chest pain, sick contacts, orthopnea, N/V, dysphagia or choking episodes, or LE swelling/ pain.  Lives alone, uses a cane, and reports is otherwise independent in ADLs except for doesn't drive anymore.  Is not on home oxygen  or uses any inhalers/ nebs at baseline.    Found to be hypoxic on admit at 68% on RA, afebrile, WBC 15, PCT 0.11, SARs, flu, RSV neg, MRSA PCR neg.  CTA PE neg for PE but showing centrilobular and paraseptal emphysema with patchy consolidation and GGO bilaterally, moderate partially loculated left pleural effusion, and s/p radiation changes in upper and lower lobes bilaterally, interval enlargement of mediastinal lymph nodes, and enlarged pulmonic trunk indicative of pulmonary arterial hypertension. Admitted to [facility].  S/p empiric azithro and ctx on 1/14.  Oncology following, changed to [facility] and started on steroids started 1/15.  Pulmonary consulted 1/16 given CT findings and ongoing HHFNC of 60% and 50L.  Pt reports feeling better since admit.   Currently no SOB at rest and continues to deny cough.     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   worked up with cardiology dr hochrein and f/u prn   Nocturia 02/11/2021   Prostate CA (HCC) 2024   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   Wears glasses 02/11/2021     Significant Hospital Events: Including procedures, antibiotic start and stop dates in addition to other pertinent events     Interim History / Subjective:  07/25/24 Admit 1/15 steroids added, HHFNC 75%-80%, 50L 1/19 HF 45>60%  Objective    Blood pressure (!) 101/55, pulse 80, temperature (!) 97.4 F (36.3 C), temperature source Axillary, resp. rate (!) 37, height 6' (1.829 m), weight 63.8 kg, SpO2 (!) 89%.    FiO2 (%):  [50 %-69 %] 60 %   Intake/Output Summary (Last 24 hours) at  08/02/2024 1001 Last data filed at 08/02/2024 0400 Gross per 24 hour  Intake 297.69 ml  Output 1450 ml  Net -1152.31 ml   Filed Weights   07/27/24 1822  Weight: 63.8 kg    Examination: Physical Exam General:  pleasant thin elderly male sitting in bed in NAD HEENT: MM pink/moist Neuro: Aox4, MAE CV: rr, NSR, port right chest accessed, appears euvolemic  PULM:  mild resp distress, speaking in full sentences, no wheezing GI: soft,+bs, NT Extremities: warm/dry, no LE edema  Skin: no rashes    CTA angio 01/14 1. Negative for pulmonary embolus. 2. Multilobar pneumonia. 3. 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 Evaluation for recurrent disease is challenging due to surrounding acute ground-glass and consolidation. 4. Interval enlargement of mediastinal lymph nodes may be reactive in etiology. Recommend attention on follow-up. 5. Partially loculated moderate left pleural effusion. 6. Aortic atherosclerosis (ICD10-I70.0). Coronary artery calcification. 7. Enlarged pulmonic trunk, indicative of pulmonary arterial hypertension.  Resolved problem list   Assessment and Plan  Acute hypoxic respiratory failure Metastatic small cell lung cancer GGO bilateral chemo induced pneumonitis Metastatic small cell lung cancer, s/p palliative chemo/ radiation currently on durvalumab  (since 08/2023), last dose 12/17 Multifocal GGO- ddx include chemo induced pneumonitis vs pneumonia vs lymphangitic spread.  Procal, RVP, MRSA, strep negative.   TTE 1/16> EF 60-65%, normal RSVF, no RWMA, trivial MR, mild to mod AR P:  - cont to wean HHFNC for sat goal > 88%.   - change solumedrol to prednisone  60 mg daily > he will need a 6 weeks of prednisone  taper (60mg  x 1 week, 50 mg x1w, 40mg x1w, 30x1w, 20mg x1w, 10mg  x1w) with bactrim  prophylaxis (1DS tablet Monday, Wednesday and Friday) -Zosyn  end date 01/22 -ongoing pulm hygiene with IS, flutter, mucolytic, prn duonebs -PT/OT   I had goals of care and patient is DNR/DNI.     Labs   CBC: Recent Labs  Lab 07/30/24 0619 08/02/24 0351  WBC 17.3* 15.7*  NEUTROABS 15.0*  --   HGB 11.6* 12.0*  HCT 35.5* 38.6*  MCV 95.9 98.2  PLT 189 198    Basic Metabolic Panel: Recent Labs  Lab 07/30/24 0619 08/02/24 0351  NA 139 142  K 4.3 4.1  CL 102 104  CO2 32 30  GLUCOSE 90 90  BUN 19 19  CREATININE 0.90 0.95  CALCIUM 9.8 9.6  MG 2.4  --    GFR: Estimated Creatinine Clearance: 56.9 mL/min (by C-G formula based on SCr of  0.95 mg/dL). Recent Labs  Lab 07/28/24 1521 07/30/24 0619 08/02/24 0351  PROCALCITON <0.10  --   --   WBC  --  17.3* 15.7*    Liver Function Tests: Recent Labs  Lab 08/02/24 0351  AST 31  ALT 44  ALKPHOS 105  BILITOT 0.7  PROT 6.0*  ALBUMIN 3.0*   No results for input(s): LIPASE, AMYLASE in the last 168 hours. No results for input(s): AMMONIA in the last 168 hours.  ABG    Component Value Date/Time   PHART 7.47 (H) 07/25/2024 1316   PCO2ART 41 07/25/2024 1316   PO2ART 121 (H) 07/25/2024 1316   HCO3 29.8 (H) 07/25/2024 1316   TCO2 26 07/25/2024 1328   O2SAT 99.1 07/25/2024 1316     Coagulation Profile: No results for input(s): INR, PROTIME in the last 168 hours.   Cardiac Enzymes: No results for input(s): CKTOTAL, CKMB, CKMBINDEX, TROPONINI in the last 168 hours.  HbA1C: No results found for:  HGBA1C  CBG: No results for input(s): GLUCAP in the last 168 hours.  Review of Systems:   As above   Past Medical History:  He,  has a past medical history of Aortic regurgitation, Arthritis (02/11/2021), Bladder cancer (HCC), Frequency of urination (02/11/2021), History of prostate cancer (2014), Hydrocele, Hypertension (02/11/2021), Mobitz type 1 second degree atrioventricular block (08/11/2018), Nocturia (02/11/2021), Prostate CA (HCC) (2024), Psoriasis (02/11/2021), Small cell lung cancer (HCC) (08/2023), and Wears glasses (02/11/2021).   Surgical History:   Past Surgical History:  Procedure Laterality Date   ABDOMINAL HERNIA REPAIR  1990's   BRONCHIAL BIOPSY  08/08/2023   Procedure: BRONCHIAL BIOPSIES;  Surgeon: Shelah Lamar RAMAN, MD;  Location: [facility] ENDOSCOPY;  Service: Pulmonary;;   BRONCHIAL BRUSHINGS  08/08/2023   Procedure: BRONCHIAL BRUSHINGS;  Surgeon: Shelah Lamar RAMAN, MD;  Location: [facility] ENDOSCOPY;  Service: Pulmonary;;   BRONCHIAL NEEDLE ASPIRATION BIOPSY  08/08/2023   Procedure: BRONCHIAL NEEDLE ASPIRATION BIOPSIES;  Surgeon: Shelah Lamar RAMAN, MD;  Location: [facility] ENDOSCOPY;  Service: Pulmonary;;   CYSTOSCOPY W/ RETROGRADES N/A 11/03/2015   Procedure: CYSTOSCOPY WITH RETROGRADE PYELOGRAM;  Surgeon: Belvie LITTIE Clara, MD;  Location: [facility];  Service: Urology;  Laterality: N/A;   CYSTOSCOPY W/ RETROGRADES Left 01/09/2016   Procedure: CYSTOSCOPY WITH LEFT RETROGRADE PYELOGRAM;  Surgeon: Belvie LITTIE Clara, MD;  Location: [facility];  Service: Urology;  Laterality: Left;   CYSTOSCOPY W/ RETROGRADES Bilateral 05/28/2016   Procedure: CYSTOSCOPY WITH RETROGRADE PYELOGRAM;  Surgeon: Belvie LITTIE Clara, MD;  Location: [facility];  Service: Urology;  Laterality: Bilateral;   CYSTOSCOPY W/ RETROGRADES Bilateral 02/17/2021   Procedure: CYSTOSCOPY WITH RETROGRADE PYELOGRAM;  Surgeon: Rosalind Zachary NOVAK, MD;  Location: [facility];  Service: Urology;  Laterality: Bilateral;   CYSTOSCOPY W/ URETERAL STENT REMOVAL Left 01/09/2016   Procedure: CYSTOSCOPY WITH LEFT STENT EXCHANGE;  Surgeon: Belvie LITTIE Clara, MD;  Location: [facility];  Service: Urology;  Laterality: Left;   CYSTOSCOPY WITH BIOPSY N/A 05/28/2016   Procedure: CYSTOSCOPY WITH BIOPSY;  Surgeon: Belvie LITTIE Clara, MD;  Location: [facility];  Service: Urology;  Laterality: N/A;   CYSTOSCOPY WITH URETHRAL DILATATION N/A 05/28/2016   Procedure: CYSTOSCOPY WITH URETHRAL DILATATION;  Surgeon: Belvie LITTIE Clara, MD;  Location: [facility];  Service: Urology;  Laterality: N/A;   HEMOSTASIS CONTROL  08/08/2023   Procedure: HEMOSTASIS CONTROL;  Surgeon: Shelah Lamar RAMAN, MD;  Location: [facility] ENDOSCOPY;  Service: Pulmonary;;   IR IMAGING GUIDED PORT INSERTION  09/19/2023   PROSTATE BIOPSY  05/08/12   Adenocarcinoma   RADIOACTIVE SEED IMPLANT  08/01/2012   Procedure: RADIOACTIVE SEED IMPLANT;  Surgeon: Thomasine Oiler, MD;  Location: [facility];  Service: Urology;   Laterality: N/A;  73 seeds implanted no seeds found in bladder   TRANSURETHRAL RESECTION OF BLADDER TUMOR N/A 11/03/2015   Procedure: TRANSURETHRAL RESECTION OF BLADDER TUMOR (TURBT)WITH INSERTION STENT;  Surgeon: Belvie LITTIE Clara, MD;  Location: [facility];  Service: Urology;  Laterality: N/A;   TRANSURETHRAL RESECTION OF BLADDER TUMOR N/A 02/17/2021   Procedure: TRANSURETHRAL RESECTION OF BLADDER TUMOR (TURBT);  Surgeon: Rosalind Zachary NOVAK, MD;  Location[facility];  Service: Urology;  Laterality: N/A;  30 MINS   TRANSURETHRAL RESECTION OF BLADDER TUMOR WITH GYRUS (TURBT-GYRUS) N/A 01/09/2016   Procedure: TRANSURETHRAL RESECTION OF BLADDER TUMOR WITH GYRUS (TURBT-GYRUS);  Surgeon: Belvie LITTIE Clara, MD;  Location[facility];  Service: Urology;  Laterality: N/A;   VIDEO BRONCHOSCOPY WITH RADIAL ENDOBRONCHIAL ULTRASOUND  08/08/2023   Procedure: VIDEO BRONCHOSCOPY WITH RADIAL ENDOBRONCHIAL ULTRASOUND;  Surgeon: Shelah Lamar RAMAN, MD;  Location: [facility] ENDOSCOPY;  Service: Pulmonary;;     Social History:   reports that he quit smoking about 9 years ago. His smoking use included cigarettes. He started smoking about 51 years ago. He has a 21 pack-year smoking history. He has never used smokeless tobacco. He reports that he does not currently use drugs. He reports that he does not drink alcohol.   Family History:  His family history includes Aneurysm in his father; Asthma in his mother and sister; Heart failure in his mother; Hyperlipidemia in his mother and sister.   Allergies Allergies[1]   Home Medications  Prior to Admission medications  Medication Sig Start Date End Date Taking? Authorizing Provider  albuterol  (VENTOLIN  HFA) 108 (90 Base) MCG/ACT inhaler Inhale 1-2 puffs into the lungs every 6 (six) hours as needed for wheezing or shortness of breath. 05/12/24  Yes Yolande Lamar BROCKS, MD  amLODipine (NORVASC) 10 MG tablet Take 10 mg by mouth daily.    Yes [provider]  APIXABAN  (ELIQUIS ) VTE STARTER PACK (10MG  AND 5MG ) Take as directed on package: start with two-5mg  tablets twice daily for 7 days. On day 8, switch to one-5mg  tablet twice daily. Patient not taking: Reported on 07/25/2024 05/12/24   Yolande Lamar BROCKS, MD  lidocaine -prilocaine  (EMLA ) cream Apply 1 Application topically as needed. Patient not taking: Reported on 07/25/2024 08/25/23   Heilingoetter, Cassandra L, PA-C  memantine  (NAMENDA ) 10 MG tablet Take 1 tablet (10 mg total) by mouth 2 (two) times daily. Patient not taking: Reported on 07/25/2024 12/07/23   Lanell Donald Stagger, PA-C  memantine  (NAMENDA ) 5 MG tablet Begin this prescription the first day of brain radiation. Week 1: take one tablet po qam. Week 2: take one tablet qam and qpm. Week 3: take two tablets qam, and one tablet po q pm. Week 4: take two tablets qam and qpm. Fill subsequent prescription q month. Patient not taking: Reported on 07/25/2024 12/07/23   Lanell Donald Stagger, PA-C  prochlorperazine  (COMPAZINE ) 10 MG tablet Take 1 tablet (10 mg total) by mouth every 6 (six) hours as needed. Patient not taking: Reported on 07/25/2024 08/25/23   Heilingoetter, Cassandra L, PA-C  psyllium (METAMUCIL SMOOTH TEXTURE) 58.6 % powder Take 1 packet by mouth 3 (three) times daily. Patient not taking: Reported on 07/25/2024 05/29/24   Kingsley, Victoria K, DO  senna-docusate (SENOKOT-S) 8.6-50 MG tablet Take 1 tablet by mouth daily. Patient not taking: Reported on 07/25/2024 05/29/24   Kingsley, Victoria K, DO       I personally spent 25 minutes providing critical care not including any separately billable procedures.  Marny Patch, MD [facility] 08/02/2024 10:01 AM                  [1] No Known Allergies  "

## 2024-08-02 NOTE — Plan of Care (Signed)

## 2024-08-02 NOTE — Progress Notes (Signed)
 " PROGRESS NOTE    John [name]  FMW:[id number] DOB: 1945-08-04 DOA: 07/25/2024 PCP: Practice, [facility]   Brief Narrative: 79 y.o. 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 Sherrod.  Underwent radiation in the past.  Received last dose of immunotherapy with durvalumab  12/16. 12/30, he had a chest x-ray done which resulted 10 days later on 1/10 to show bilateral pneumonia.  Patient continued to have symptoms at that time but then suddenly started having shortness of breath, cough and decreased exercise tolerance.SABRA  He does not have any supplemental oxygen  at home. 1/11, he called Dr. Jeannett office and was called in a prescription for doxycycline which he started 1/13.   1/14, patient felt significantly short of breath and hence presented to the ED   In the ED, patient is afebrile, heart rate in 100s, blood pressure in 110s, initially very hypoxic at 68% on room air and required 15 L oxygen  by nasal cannula.   VBG with pH 7.47, pCO2 41, O2 sat 99 CBC with WC count 15, lactic acid normal, CMP unremarkable Respiratory virus panel negative   CT angio chest was negative for pulm embolism but  -showed patchy consolidation and ground glass bilaterally suggestive of multifocal pneumonia. -partially loculated moderate left pleural effusion. -Enlarged pulmonic trunk, indicative of pulmonary arterial hypertension.   Blood culture collected Patient was started on IV Rocephin , azithromycin  Admitted to [facility]   Subsequently, patient's oxygen  requirement worsened significantly up to 50 L heated high flow nasal cannula. Patient remains in stepdown unit closely followed by pulmonary.     Assessment & Plan:    Principal Problem:   Multifocal pneumonia Active Problems:   History of lung cancer   Shortness of breath   Sepsis (HCC)  Acute respiratory failure with hypoxia Multifocal infiltrates patient was admitted with progressively worsening shortness of breath.  His saturation was in the mid 60s on room air in the emergency room.  CT of the chest did not reveal any pulmonary embolism but showed multifocal infiltrates consistent with consolidation.  Initially was given Rocephin  and azithromycin  then changed to Zosyn .  Prior hospitalist discussed with Dr. Sherrod recommended prednisone  for 7 days.  He is slowly improving still on 40 L of high flow nasal cannula.   Starting prednisone  today 08/02/2024, Solu-Medrol  stopped yesterday  Chest x-ray from [facility] showed multifocal bilateral airspace disease with right apical sparing small left pleural effusion and mild elevation of the left hemidiaphragm.     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 Sherrod.  Underwent radiation in the past.  Received last dose of immunotherapy with durvalumab  12/16. Oncology following   Hypertension H/o moderate aortic regurgitation CT angio showed enlarged pulmonary trunk indicating pulmonary artery hypertension Last echo from 2018 had shown moderate AI.  Repeat echo report pending PTA meds- amlodipine 10 mg daily.  Currently on hold.  Continue to monitor blood pressure. Blood pressure still soft 101/55.   H/o prostate cancer s/p brachytherapy H/o bladder cancer   Estimated body mass index is 19.08 kg/m as calculated from the following:   Height as of this encounter: 6' (1.829 m).   Weight as  of this encounter: 63.8 kg.  DVT prophylaxis: Lovenox   code Status: DNR/DNI Family Communication: None at bedside  disposition Plan:  Status is: Inpatient Remains inpatient appropriate because: Acute illness    Consultants: Pulmonologist and oncologist  Procedures: None  Antimicrobials: Zosyn   Subjective: He reports not feeling well today like yesterday has this left-sided chest pain which has been there for many years associated with malignancy of the lung on the left side. On 40 L of high flow  Objective: Vitals:   08/02/24 0600 08/02/24 0700 08/02/24 0800 08/02/24 0831  BP: (!) 87/36 (!) 101/55    Pulse: 77 80    Resp: 20 (!) 37    Temp:   (!) 97.4 F (36.3 C) (!) 97.4 F (36.3 C)  TempSrc:   Axillary Axillary  SpO2: 91% (!) 89%    Weight:      Height:        Intake/Output Summary (Last 24 hours) at 08/02/2024 1041 Last data filed at 08/02/2024 1038 Gross per 24 hour  Intake 233.64 ml  Output 1450 ml  Net -1216.36 ml   Filed Weights   07/27/24 1822  Weight: 63.8 kg    Examination:  General exam: Appears in mild distress Respiratory system:  Respiratory effort normal.  No wheezing rhonchi Cardiovascular system: irregular Gastrointestinal system: Abdomen is nondistended, soft and nontender. No organomegaly or masses felt. Normal bowel sounds heard. Central nervous system: Alert and oriented. No focal neurological deficits. Extremities: Symmetric 5 x 5 power.   Data Reviewed: I have personally reviewed following labs and imaging studies  CBC: Recent Labs  Lab 07/30/24 0619 08/02/24 0351  WBC 17.3* 15.7*  NEUTROABS 15.0*  --   HGB 11.6* 12.0*  HCT 35.5* 38.6*  MCV 95.9 98.2  PLT 189 198   Basic Metabolic Panel: Recent Labs  Lab 07/30/24 0619 08/02/24 0351  NA 139 142  K 4.3 4.1  CL 102 104  CO2 32 30  GLUCOSE 90 90  BUN 19 19  CREATININE 0.90 0.95  CALCIUM 9.8 9.6  MG 2.4  --    GFR: Estimated Creatinine Clearance: 56.9 mL/min (by C-G formula based on SCr of 0.95 mg/dL). Liver Function Tests: Recent Labs  Lab 08/02/24 0351  AST 31  ALT 44  ALKPHOS 105  BILITOT 0.7  PROT 6.0*  ALBUMIN 3.0*   No results for input(s): LIPASE, AMYLASE in  the last 168 hours. No results for input(s): AMMONIA in the last 168 hours. Coagulation Profile: No results for input(s): INR, PROTIME in the last 168 hours.  Cardiac Enzymes: No results for input(s): CKTOTAL, CKMB, CKMBINDEX, TROPONINI in the last 168 hours. BNP (last 3 results) Recent Labs    05/12/24 1726 05/29/24 1826 07/25/24 1314  PROBNP 113.0 113.0 380.0*   HbA1C: No results for input(s): HGBA1C in the last 72 hours. CBG: No results for input(s): GLUCAP in the last 168 hours. Lipid Profile: No results for input(s): CHOL, HDL, LDLCALC, TRIG, CHOLHDL, LDLDIRECT in the last 72 hours. Thyroid  Function Tests: No results for input(s): TSH, T4TOTAL, FREET4, T3FREE, THYROIDAB in the last 72 hours. Anemia Panel: No results for input(s): VITAMINB12, FOLATE, FERRITIN, TIBC, IRON, RETICCTPCT in the last 72 hours. Sepsis Labs: Recent Labs  Lab 07/28/24 1521  PROCALCITON <0.10    Recent Results (from the past 240 hours)  Blood Culture (routine x 2)     Status: None   Collection Time: 07/25/24  1:25 PM   Specimen: BLOOD RIGHT FOREARM  Result Value Ref  Range Status   Specimen Description   Final    BLOOD RIGHT FOREARM Performed at [facility], [address] [place], [place] [address]    Special Requests   Final    BOTTLES DRAWN AEROBIC AND ANAEROBIC Blood Culture adequate volume Performed at [facility], [address], [place], [place] [address]    Culture   Final    NO GROWTH 5 DAYS Performed at [facility], [address], [place], [place] [address]    Report Status 07/30/2024 FINAL  Final  Blood Culture (routine x 2)     Status: None   Collection Time: 07/25/24  1:33 PM   Specimen: BLOOD  Result Value Ref Range Status   Specimen Description   Final    BLOOD LEFT ANTECUBITAL Performed at [facility], [address] [place], [place] [address]    Special  Requests   Final    BOTTLES DRAWN AEROBIC AND ANAEROBIC Blood Culture adequate volume Performed at [facility], [address], [place], [place] [address]    Culture   Final    NO GROWTH 5 DAYS Performed at [facility], [address] [place], [place] [address]    Report Status 07/30/2024 FINAL  Final  Resp panel by RT-PCR (RSV, Flu A&B, Covid) Anterior Nasal Swab     Status: None   Collection Time: 07/25/24  1:47 PM   Specimen: Anterior Nasal Swab  Result Value Ref Range Status   SARS Coronavirus 2 by RT PCR NEGATIVE NEGATIVE Final    Comment: (NOTE) SARS-CoV-2 target nucleic acids are NOT DETECTED.  The SARS-CoV-2 RNA is generally detectable in upper respiratory specimens during the acute phase of infection. The lowest concentration of SARS-CoV-2 viral copies this assay can detect is 138 copies/mL. A negative result does not preclude SARS-Cov-2 infection and should not be used as the sole basis for treatment or other patient management decisions. A negative result may occur with  improper specimen collection/handling, submission of specimen other than nasopharyngeal swab, presence of viral mutation(s) within the areas targeted by this assay, and inadequate number of viral copies(<138 copies/mL). A negative result must be combined with clinical observations, patient history, and epidemiological information. The expected result is Negative.  Fact Sheet for Patients:  bloggercourse.com  Fact Sheet for Healthcare Providers:  seriousbroker.it  This test is no t yet approved or cleared by the [place]  FDA and  has been authorized for detection and/or diagnosis of SARS-CoV-2 by FDA under an Emergency Use Authorization (EUA). This EUA will remain  in effect (meaning this test can be used) for the duration of the COVID-19 declaration under Section 564(b)(1) of the Act, 21 U.S.C.section 360bbb-3(b)(1),  unless the authorization is terminated  or revoked sooner.       Influenza A by PCR NEGATIVE NEGATIVE Final   Influenza B by PCR NEGATIVE NEGATIVE Final    Comment: (NOTE) The Xpert Xpress SARS-CoV-2/FLU/RSV plus assay is intended as an aid in the diagnosis of influenza from Nasopharyngeal swab specimens and should not be used as a sole basis for treatment. Nasal washings and aspirates are unacceptable for Xpert Xpress SARS-CoV-2/FLU/RSV testing.  Fact Sheet for Patients: bloggercourse.com  Fact Sheet for Healthcare Providers: seriousbroker.it  This test is not yet approved or cleared by the [place]  FDA and has been authorized for detection and/or diagnosis of SARS-CoV-2 by FDA under an Emergency Use Authorization (EUA). This EUA will remain in  effect (meaning this test can be used) for the duration of the COVID-19 declaration under Section 564(b)(1) of the Act, 21 U.S.C. section 360bbb-3(b)(1), unless the authorization is terminated or revoked.     Resp Syncytial Virus by PCR NEGATIVE NEGATIVE Final    Comment: (NOTE) Fact Sheet for Patients: bloggercourse.com  Fact Sheet for Healthcare Providers: seriousbroker.it  This test is not yet approved or cleared by the [place]  FDA and has been authorized for detection and/or diagnosis of SARS-CoV-2 by FDA under an Emergency Use Authorization (EUA). This EUA will remain in effect (meaning this test can be used) for the duration of the COVID-19 declaration under Section 564(b)(1) of the Act, 21 U.S.C. section 360bbb-3(b)(1), unless the authorization is terminated or revoked.  Performed at [facility], [address], [place], [place] [address]   MRSA Next Gen by PCR, Nasal     Status: None   Collection Time: 07/26/24  2:43 PM   Specimen: Nasal Mucosa; Nasal Swab  Result Value Ref Range Status    MRSA by PCR Next Gen NOT DETECTED NOT DETECTED Final    Comment: (NOTE) The GeneXpert MRSA Assay (FDA approved for NASAL specimens only), is one component of a comprehensive MRSA colonization surveillance program. It is not intended to diagnose MRSA infection nor to guide or monitor treatment for MRSA infections. Test performance is not FDA approved in patients less than 4 years old. Performed at [facility], [address], [place], [place] [address]          Radiology Studies: DG CHEST PORT 1 VIEW Result Date: 08/01/2024 EXAM: 1 VIEW XRAY OF THE CHEST 08/01/2024 05:28:00 AM COMPARISON: Portable chest 07/29/24. CLINICAL HISTORY: FINDINGS: LINES, TUBES AND DEVICES: Right chest IJ approach port catheter again terminates in the upper right atrium. Multiple overlying telemetry leads. LUNGS AND PLEURA: Multifocal bilateral airspace disease with right apical sparing continues to be seen with a small left pleural effusion. Overall aeration seems unchanged. HEART AND MEDIASTINUM: Stable mediastinum with calcification in the transverse aorta. The cardiac size is normal. BONES AND SOFT TISSUES: There is slight thoracic dextroscoliosis with no new osseous findings. Mild elevation of left hemidiaphragm. IMPRESSION: 1. Multifocal bilateral airspace disease with right apical sparing, small left pleural effusion, and mild elevation of the left hemidiaphragm, without significant interval change. Electronically signed by: Francis Quam MD 08/01/2024 05:53 AM EST RP Workstation: [id number]     Scheduled Meds:  Chlorhexidine  Gluconate Cloth  6 each Topical Daily   enoxaparin  (LOVENOX ) injection  40 mg Subcutaneous Q24H   feeding supplement  237 mL Oral BID BM   guaiFENesin   600 mg Oral BID   pneumococcal 20-valent conjugate vaccine  0.5 mL Intramuscular Tomorrow-1000   predniSONE   60 mg Oral Q breakfast   senna  1 tablet Oral BID   sodium chloride  flush  10-40 mL Intracatheter Q12H    Continuous Infusions:  piperacillin -tazobactam (ZOSYN )  IV 12.5 mL/hr at 08/02/24 1038     LOS: 8 days     Almarie KANDICE Hoots, MD 08/02/2024, 10:41 AM   "

## 2024-08-02 NOTE — Progress Notes (Signed)
 Occupational Therapy Treatment Patient Details Name: John [name] MRN: [id number] DOB: 1945-07-22 Today's Date: 08/02/2024   History of present illness Mr. Filip is a 79 yr old male admitted to the hospital with shortness of breath on 07/25/24. He was found to have acute respiratory failure with hypoxia and multi-focal PNA requiring heated high flow O2. PMH: current small cell lung cancer s/p brachytherapy, bladder CA, HTN, aortic regurgitation, arthritis, psoriasis   OT comments  The pt was pleasant and motivated to participate in the therapy session. He reported sitting up in the bedside chair today for 3 to 4 hours. OT instructed him on BUE and BLE therapeutic exercises for strengthening needed to facilitate progressive ADL performance. While in the semi-fowler's position, he used a light resistance exercise band to perform 10 reps and 1 set of upper body exercises using a light resistance exercise band, specifically diagonal exchanges, vertical pulls, and horizontal exchanges. He was further instructed on and performed BLE hip abduction/adduction, knee flexion and extension. He presented with good overall effort and participation. Continue OT plan of care. Patient will benefit from continued inpatient follow up therapy, <3 hours/day.       If plan is discharge home, recommend the following:  Assistance with cooking/housework;Assist for transportation;A little help with walking and/or transfers;A lot of help with walking and/or transfers   Equipment Recommendations  BSC/3in1;Tub/shower bench    Recommendations for Other Services      Precautions / Restrictions Precautions Precautions: Fall Precaution/Restrictions Comments: watch O2 on HHFNC Restrictions Weight Bearing Restrictions Per Provider Order: No        ADL either performed or assessed with clinical judgement   ADL Overall ADL's : Needs assistance/impaired Eating/Feeding: Independent;Sitting Eating/Feeding Details  (indicate cue type and reason): pt reported no difficulty with self-feeding during hospital stay              Communication Communication Communication: No apparent difficulties   Cognition Arousal: Alert Behavior During Therapy: WFL for tasks assessed/performed Cognition: No apparent impairments             OT - Cognition Comments: Oriented x4                 Following commands: Intact        Cueing   Cueing Techniques: Verbal cues             Pertinent Vitals/ Pain       Pain Assessment Pain Assessment: No/denies pain   Frequency  Min 2X/week        Progress Toward Goals  OT Goals(current goals can now be found in the care plan section)     Acute Rehab OT Goals OT Goal Formulation: With patient Time For Goal Achievement: 08/12/24 Potential to Achieve Goals: Good  Plan         AM-PAC OT 6 Clicks Daily Activity     Outcome Measure   Help from another person eating meals?: None Help from another person taking care of personal grooming?: A Little Help from another person toileting, which includes using toliet, bedpan, or urinal?: A Lot Help from another person bathing (including washing, rinsing, drying)?: A Lot Help from another person to put on and taking off regular upper body clothing?: A Little Help from another person to put on and taking off regular lower body clothing?: A Lot 6 Click Score: 16    End of Session Equipment Utilized During Treatment: Oxygen   OT Visit Diagnosis: Muscle weakness (generalized) (M62.81);Unsteadiness on feet (R26.81);Other  abnormalities of gait and mobility (R26.89)   Activity Tolerance Patient tolerated treatment well   Patient Left in bed;with call bell/phone within reach;with bed alarm set   Nurse Communication Mobility status        Time: 8298-8281 OT Time Calculation (min): 17 min  Charges: OT General Charges $OT Visit: 1 Visit OT Treatments $Therapeutic Activity: 8-22 mins    Delanna JINNY Lesches, OTR/L 08/02/2024, 5:38 PM

## 2024-08-03 ENCOUNTER — Inpatient Hospital Stay (HOSPITAL_COMMUNITY)

## 2024-08-03 DIAGNOSIS — J704 Drug-induced interstitial lung disorders, unspecified: Secondary | ICD-10-CM | POA: Diagnosis not present

## 2024-08-03 DIAGNOSIS — C7800 Secondary malignant neoplasm of unspecified lung: Secondary | ICD-10-CM

## 2024-08-03 DIAGNOSIS — T451X5A Adverse effect of antineoplastic and immunosuppressive drugs, initial encounter: Secondary | ICD-10-CM | POA: Diagnosis not present

## 2024-08-03 DIAGNOSIS — J9601 Acute respiratory failure with hypoxia: Secondary | ICD-10-CM | POA: Diagnosis not present

## 2024-08-03 DIAGNOSIS — R531 Weakness: Secondary | ICD-10-CM

## 2024-08-03 DIAGNOSIS — J188 Other pneumonia, unspecified organism: Secondary | ICD-10-CM | POA: Diagnosis not present

## 2024-08-03 DIAGNOSIS — Z515 Encounter for palliative care: Secondary | ICD-10-CM

## 2024-08-03 LAB — CBC
HCT: 35.7 % — ABNORMAL LOW (ref 39.0–52.0)
Hemoglobin: 11.4 g/dL — ABNORMAL LOW (ref 13.0–17.0)
MCH: 31.1 pg (ref 26.0–34.0)
MCHC: 31.9 g/dL (ref 30.0–36.0)
MCV: 97.5 fL (ref 80.0–100.0)
Platelets: 199 K/uL (ref 150–400)
RBC: 3.66 MIL/uL — ABNORMAL LOW (ref 4.22–5.81)
RDW: 13 % (ref 11.5–15.5)
WBC: 18.7 K/uL — ABNORMAL HIGH (ref 4.0–10.5)
nRBC: 0 % (ref 0.0–0.2)

## 2024-08-03 LAB — BASIC METABOLIC PANEL WITH GFR
Anion gap: 5 (ref 5–15)
BUN: 19 mg/dL (ref 8–23)
CO2: 32 mmol/L (ref 22–32)
Calcium: 9.4 mg/dL (ref 8.9–10.3)
Chloride: 105 mmol/L (ref 98–111)
Creatinine, Ser: 0.78 mg/dL (ref 0.61–1.24)
GFR, Estimated: >60 mL/min
Glucose, Bld: 155 mg/dL — ABNORMAL HIGH (ref 70–99)
Potassium: 4.1 mmol/L (ref 3.5–5.1)
Sodium: 142 mmol/L (ref 135–145)

## 2024-08-03 MED ORDER — ALPRAZOLAM 0.5 MG PO TABS
0.5000 mg | ORAL_TABLET | Freq: Three times a day (TID) | ORAL | Status: DC | PRN
  Administered 2024-08-03 – 2024-08-11 (×4): 0.5 mg via ORAL
  Filled 2024-08-03 (×6): qty 1

## 2024-08-03 NOTE — Progress Notes (Signed)
 " PROGRESS NOTE    John [name]  FMW:[id number] DOB: 1946/05/17 DOA: 07/25/2024 PCP: Practice, [facility]   Brief Narrative: 79 y.o. 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 Sherrod.  Underwent radiation in the past.  Received last dose of immunotherapy with durvalumab  12/16. 12/30, he had a chest x-ray done which resulted 10 days later on 1/10 to show bilateral pneumonia.  Patient continued to have symptoms at that time but then suddenly started having shortness of breath, cough and decreased exercise tolerance.SABRA  He does not have any supplemental oxygen  at home. 1/11, he called Dr. Jeannett office and was called in a prescription for doxycycline which he started 1/13.   1/14, patient felt significantly short of breath and hence presented to the ED   In the ED, patient is afebrile, heart rate in 100s, blood pressure in 110s, initially very hypoxic at 68% on room air and required 15 L oxygen  by nasal cannula.   VBG with pH 7.47, pCO2 41, O2 sat 99 CBC with WC count 15, lactic acid normal, CMP unremarkable Respiratory virus panel negative   CT angio chest was negative for pulm embolism but  -showed patchy consolidation and ground glass bilaterally suggestive of multifocal pneumonia. -partially loculated moderate left pleural effusion. -Enlarged pulmonic trunk, indicative of pulmonary arterial hypertension.   Blood culture collected Patient was started on IV Rocephin , azithromycin  Admitted to [facility]   Subsequently, patient's oxygen  requirement worsened significantly up to 50 L heated high flow nasal cannula. Patient remains in stepdown unit closely followed by pulmonary.     Assessment & Plan:    Principal Problem:   Multifocal pneumonia Active Problems:   History of lung cancer   Shortness of breath   Sepsis (HCC)  Acute respiratory failure with hypoxia ?  Pneumonitis from recent chemotherapy vs pneumonia in the setting of small cell lung cancer.   CT of the chest did not reveal any pulmonary embolism but showed multifocal infiltrates consistent with consolidation.  Patient admitted with gradually worsening shortness of breath.  Saturation was mid 60s on room air in the ER.  Initially was given Rocephin  and azithromycin  then changed to Zosyn .  Prior hospitalist discussed with Dr. Sherrod recommended prednisone  for 7 days.  His oxygen  requirement seems to be going up to 60 L.  He seems anxious and disappointed and depressed.  He is DNR/DNI.  We asked palliative care to see for goals of care.   Solu-Medrol  changed to prednisone  on the 22nd  Chest x-ray from 21st showed multifocal bilateral airspace disease with right apical sparing small left pleural effusion and mild elevation of the left hemidiaphragm.   Follow-up chest x-ray from today pending. he finished a course of Zosyn .   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 Sherrod.  Underwent radiation in the past.  Received last dose of immunotherapy with durvalumab  12/16. Oncology following   Hypertension H/o moderate aortic regurgitation CT angio showed enlarged pulmonary trunk indicating pulmonary artery hypertension. Last echo from 2018 had shown moderate AI.  Repeat echo report pending PTA meds- amlodipine 10 mg daily.  Currently on hold.  Blood pressure continue to  be soft 111/46.   H/o prostate cancer s/p brachytherapy H/o bladder cancer   Estimated body mass index is 19.08 kg/m as calculated from the following:   Height as of this encounter: 6' (1.829 m).   Weight as of this encounter: 63.8 kg.  DVT prophylaxis:  Lovenox   code Status: DNR/DNI Family Communication: None at bedside  disposition Plan:  Status is: Inpatient Remains inpatient appropriate because: Acute illness   Consultants: Pulmonologist and oncologist  Procedures: None  Antimicrobials: Zosyn   Subjective:  Continues to feel weak though he was able to sit up yesterday most of the day.  Objective: Vitals:   08/03/24 0900 08/03/24 1000 08/03/24 1100 08/03/24 1157  BP: (!) 138/42 (!) 136/58 (!) 111/46   Pulse: 99 (!) 53 (!) 54   Resp: (!) 35 (!) 25 (!) 26   Temp:    97.7 F (36.5 C)  TempSrc:    Oral  SpO2: 91% (!) 89% 91%   Weight:      Height:        Intake/Output Summary (Last 24 hours) at 08/03/2024 1241 Last data filed at 08/03/2024 0345 Gross per 24 hour  Intake 316.01 ml  Output 550 ml  Net -233.99 ml   Filed Weights   07/27/24 1822  Weight: 63.8 kg    Examination:  General exam: Appears in mild distress Respiratory system:  Respiratory effort normal.  No wheezing rhonchi Cardiovascular system: irregular Gastrointestinal system: Abdomen is nondistended, soft and nontender. No organomegaly or masses felt. Normal bowel sounds heard. Central nervous system: Alert and oriented. No focal neurological deficits. Extremities: no edema.   Data Reviewed: I have personally reviewed following labs and imaging studies  CBC: Recent Labs  Lab 07/30/24 0619 08/02/24 0351 08/03/24 0452  WBC 17.3* 15.7* 18.7*  NEUTROABS 15.0*  --   --   HGB 11.6* 12.0* 11.4*  HCT 35.5* 38.6* 35.7*  MCV 95.9 98.2 97.5  PLT 189 198 199   Basic Metabolic Panel: Recent Labs  Lab 07/30/24 0619 08/02/24 0351 08/03/24 0452  NA 139 142 142  K 4.3 4.1 4.1  CL 102 104 105  CO2 32 30 32  GLUCOSE 90 90 155*  BUN 19 19 19   CREATININE 0.90 0.95 0.78  CALCIUM 9.8 9.6 9.4  MG 2.4  --   --    GFR: Estimated Creatinine Clearance: 67.6 mL/min (by C-G formula based on SCr of 0.78 mg/dL). Liver Function Tests: Recent Labs  Lab  08/02/24 0351  AST 31  ALT 44  ALKPHOS 105  BILITOT 0.7  PROT 6.0*  ALBUMIN 3.0*   No results for input(s): LIPASE, AMYLASE in the last 168 hours. No results for input(s): AMMONIA in the last 168 hours. Coagulation Profile: No results for input(s): INR, PROTIME in the last 168 hours.  Cardiac Enzymes: No results for input(s): CKTOTAL, CKMB, CKMBINDEX, TROPONINI in the last 168 hours. BNP (last 3 results) Recent Labs    05/12/24 1726 05/29/24 1826 07/25/24 1314  PROBNP 113.0 113.0 380.0*   HbA1C: No results for input(s): HGBA1C in the last 72 hours. CBG: No results for input(s): GLUCAP in the last 168 hours. Lipid Profile: No results for input(s): CHOL, HDL, LDLCALC, TRIG, CHOLHDL, LDLDIRECT in the last 72 hours. Thyroid  Function Tests: No results for input(s): TSH, T4TOTAL, FREET4, T3FREE, THYROIDAB in the last 72 hours. Anemia Panel: No results for input(s): VITAMINB12, FOLATE, FERRITIN, TIBC, IRON, RETICCTPCT in the last 72 hours. Sepsis Labs: Recent Labs  Lab 07/28/24 1521  PROCALCITON <0.10    Recent Results (from the past 240 hours)  Blood Culture (routine x 2)     Status: None   Collection Time: 07/25/24  1:25 PM   Specimen: BLOOD RIGHT FOREARM  Result Value Ref Range Status   Specimen Description   Final    BLOOD RIGHT FOREARM Performed at [facility], [address] [place], [place] [address]    Special Requests   Final    BOTTLES DRAWN AEROBIC AND ANAEROBIC Blood Culture adequate volume Performed at [facility], [address], [place], [place] [address]    Culture   Final    NO GROWTH 5 DAYS Performed at [facility], [address] [place], [place] [address]    Report Status 07/30/2024 FINAL  Final  Blood Culture (routine x 2)     Status: None   Collection Time: 07/25/24  1:33 PM   Specimen: BLOOD  Result Value Ref Range Status   Specimen Description    Final    BLOOD LEFT ANTECUBITAL Performed at [facility], [address] [place], [place] [address]    Special Requests   Final    BOTTLES DRAWN AEROBIC AND ANAEROBIC Blood Culture adequate volume Performed at [facility], [address] [place], [place] [address]    Culture   Final    NO GROWTH 5 DAYS Performed at [facility], [address], [place], [place] [address]    Report Status 07/30/2024 FINAL  Final  Resp panel by RT-PCR (RSV, Flu A&B, Covid) Anterior Nasal Swab     Status: None   Collection Time: 07/25/24  1:47 PM   Specimen: Anterior Nasal Swab  Result Value Ref Range Status   SARS Coronavirus 2 by RT PCR NEGATIVE NEGATIVE Final    Comment: (NOTE) SARS-CoV-2 target nucleic acids are NOT DETECTED.  The SARS-CoV-2 RNA is generally detectable in upper respiratory specimens during the acute phase of infection. The lowest concentration of SARS-CoV-2 viral copies this assay can detect is 138 copies/mL. A negative result does not preclude SARS-Cov-2 infection and should not be used as the sole basis for treatment or other patient management decisions. A negative result may occur with  improper specimen collection/handling, submission of specimen other than nasopharyngeal swab, presence of viral mutation(s) within the areas targeted by this assay, and inadequate number of viral copies(<138 copies/mL). A negative result must be combined with clinical observations, patient history, and epidemiological information. The expected result is Negative.  Fact Sheet for Patients:  bloggercourse.com  Fact Sheet for Healthcare Providers:  seriousbroker.it  This test is no t yet approved or cleared by the [place]  FDA and  has been authorized for detection and/or diagnosis of SARS-CoV-2 by FDA under an Emergency Use Authorization (EUA). This EUA will remain  in effect (meaning  this test can be used) for the duration of the COVID-19 declaration under Section 564(b)(1) of the Act, 21 U.S.C.section 360bbb-3(b)(1), unless the authorization is terminated  or revoked sooner.       Influenza A by PCR NEGATIVE NEGATIVE Final   Influenza B by PCR NEGATIVE NEGATIVE Final    Comment: (NOTE) The Xpert Xpress SARS-CoV-2/FLU/RSV plus assay is intended as an aid in the diagnosis of influenza from Nasopharyngeal swab specimens and should not be used as a sole basis for treatment. Nasal washings and aspirates are unacceptable for Xpert Xpress SARS-CoV-2/FLU/RSV testing.  Fact Sheet for Patients: bloggercourse.com  Fact  Sheet for Healthcare Providers: seriousbroker.it  This test is not yet approved or cleared by the [place]  FDA and has been authorized for detection and/or diagnosis of SARS-CoV-2 by FDA under an Emergency Use Authorization (EUA). This EUA will remain in effect (meaning this test can be used) for the duration of the COVID-19 declaration under Section 564(b)(1) of the Act, 21 U.S.C. section 360bbb-3(b)(1), unless the authorization is terminated or revoked.     Resp Syncytial Virus by PCR NEGATIVE NEGATIVE Final    Comment: (NOTE) Fact Sheet for Patients: bloggercourse.com  Fact Sheet for Healthcare Providers: seriousbroker.it  This test is not yet approved or cleared by the [place]  FDA and has been authorized for detection and/or diagnosis of SARS-CoV-2 by FDA under an Emergency Use Authorization (EUA). This EUA will remain in effect (meaning this test can be used) for the duration of the COVID-19 declaration under Section 564(b)(1) of the Act, 21 U.S.C. section 360bbb-3(b)(1), unless the authorization is terminated or revoked.  Performed at [facility], [address] [place], [place] [address]   MRSA Next Gen by  PCR, Nasal     Status: None   Collection Time: 07/26/24  2:43 PM   Specimen: Nasal Mucosa; Nasal Swab  Result Value Ref Range Status   MRSA by PCR Next Gen NOT DETECTED NOT DETECTED Final    Comment: (NOTE) The GeneXpert MRSA Assay (FDA approved for NASAL specimens only), is one component of a comprehensive MRSA colonization surveillance program. It is not intended to diagnose MRSA infection nor to guide or monitor treatment for MRSA infections. Test performance is not FDA approved in patients less than 64 years old. Performed at [facility], [address], [place], [place] [address]          Radiology Studies: No results found.    Scheduled Meds:  Chlorhexidine  Gluconate Cloth  6 each Topical Daily   enoxaparin  (LOVENOX ) injection  40 mg Subcutaneous Q24H   feeding supplement  237 mL Oral BID BM   guaiFENesin   600 mg Oral BID   pneumococcal 20-valent conjugate vaccine  0.5 mL Intramuscular Tomorrow-1000   [START ON 08/09/2024] predniSONE   50 mg Oral Q breakfast   Followed by   NOREEN ON 08/16/2024] predniSONE   40 mg Oral Q breakfast   Followed by   NOREEN ON 08/23/2024] predniSONE   30 mg Oral Q breakfast   Followed by   NOREEN ON 08/30/2024] predniSONE   20 mg Oral Q breakfast   Followed by   NOREEN ON 09/06/2024] predniSONE   10 mg Oral Q breakfast   predniSONE   60 mg Oral Q breakfast   senna  1 tablet Oral BID   sodium chloride  flush  10-40 mL Intracatheter Q12H   sulfamethoxazole -trimethoprim   1 tablet Oral Q M,W,F   Continuous Infusions:     LOS: 9 days     Almarie KANDICE Hoots, MD 08/03/2024, 12:41 PM   "

## 2024-08-03 NOTE — Progress Notes (Signed)
 Physical Therapy Treatment Patient Details Name: John [name] MRN: [id number] DOB: 1946/02/19 Today's Date: 08/03/2024   History of Present Illness [name] is a 79 yr old male admitted to the hospital with shortness of breath on 07/25/24. He was found to have acute respiratory failure with hypoxia and multi-focal PNA requiring heated high flow O2. PMH: current small cell lung cancer s/p brachytherapy, bladder CA, HTN, aortic regurgitation, arthritis, psoriasis    PT Comments  Thepatient is eager to get to recliner, stating the bed is very uncomfortable.  The patient moves slowly, requiring frequent rest breaks through mobility.  Patient  mobilized from bed to recliner using a Rw and min  assistance.  Patient maintained  on 60 L 70% HHFNC.  Spo2 93% rest, drop to 85% with mobility, returned to 90% within 2 mins.  HR 90-107, RR 26-44.   Continue PT for  mobility as tolerated. Patient will benefit from continued inpatient follow up therapy, <3 hours/day    If plan is discharge home, recommend the following: A lot of help with walking and/or transfers;A little help with bathing/dressing/bathroom;Assistance with cooking/housework;Assist for transportation;Help with stairs or ramp for entrance   Can travel by private vehicle     Yes  Equipment Recommendations  Rolling walker (2 wheels)    Recommendations for Other Services       Precautions / Restrictions Precautions Precautions: Fall Precaution/Restrictions Comments: watch O2 on HHFNC Restrictions Weight Bearing Restrictions Per Provider Order: No     Mobility  Bed Mobility   Bed Mobility: Supine to Sit     Supine to sit: Contact guard, Used rails     General bed mobility comments: extra time, rest breaks intermittent to catch breath    Transfers Overall transfer level: Needs assistance Equipment used: Rolling walker (2 wheels) Transfers: Sit to/from Stand, Bed to chair/wheelchair/BSC     Step pivot transfers: Min  assist       General transfer comment: steps to   recliner with slow stepping, decreased descent control    Ambulation/Gait                   Stairs             Wheelchair Mobility     Tilt Bed    Modified Rankin (Stroke Patients Only)       Balance   Sitting-balance support: No upper extremity supported, Feet supported Sitting balance-Leahy Scale: Good     Standing balance support: Bilateral upper extremity supported, Reliant on assistive device for balance, During functional activity Standing balance-Leahy Scale: Poor                              Communication Communication Communication: No apparent difficulties  Cognition Arousal: Alert Behavior During Therapy: WFL for tasks assessed/performed   PT - Cognitive impairments: No apparent impairments                       PT - Cognition Comments: Eager to mobilize Following commands: Intact      Cueing Cueing Techniques: Verbal cues  Exercises      General Comments        Pertinent Vitals/Pain Pain Assessment Pain Assessment: Faces Faces Pain Scale: Hurts little more Pain Location: back Pain Descriptors / Indicators: Aching Pain Intervention(s): Repositioned, Monitored during session    Home Living  Prior Function            PT Goals (current goals can now be found in the care plan section) Progress towards PT goals: Progressing toward goals    Frequency    Min 2X/week      PT Plan      Co-evaluation              AM-PAC PT 6 Clicks Mobility   Outcome Measure  Help needed turning from your back to your side while in a flat bed without using bedrails?: None Help needed moving from lying on your back to sitting on the side of a flat bed without using bedrails?: A Little Help needed moving to and from a bed to a chair (including a wheelchair)?: A Little Help needed standing up from a chair using your arms  (e.g., wheelchair or bedside chair)?: A Little Help needed to walk in hospital room?: Total Help needed climbing 3-5 steps with a railing? : Total 6 Click Score: 15    End of Session Equipment Utilized During Treatment: Gait belt Activity Tolerance: Treatment limited secondary to medical complications (Comment) (DOE, desats) Patient left: in chair;with call bell/phone within reach;with chair alarm set Nurse Communication: Mobility status PT Visit Diagnosis: Difficulty in walking, not elsewhere classified (R26.2)     Time: 9164-9086 PT Time Calculation (min) (ACUTE ONLY): 38 min  Charges:    $Therapeutic Activity: 38-52 mins PT General Charges $$ ACUTE PT VISIT: 1 Visit                     Darice Potters PT Acute Rehabilitation Services Office [phone]    Potters Darice Norris 08/03/2024, 11:10 AM

## 2024-08-03 NOTE — Progress Notes (Signed)
 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 Sherrod with oncology for metastatic small cell lung cancer.  Initially dx 07/2023, s/p palliative RT completed 09/19/23, and palliative chemo with carboplatin  + etoposide  + durvalumab , currently maintained on durvalumab , last dose 12/17.  On CXR 12/30 developed bilateral lobe patchy infiltrates.  Started having some exertional SOB.  Was called in rx doxycycline that he started 1/13 and then presented to ER 1/14 due to worsening SOB.  Denies cough but reports several days ago had speck of blood but nothing since.   Denies fever, chills, cough, chest pain, sick contacts, orthopnea, N/V, dysphagia or choking episodes, or LE swelling/ pain.  Lives alone, uses a cane, and reports is otherwise independent in ADLs except for doesn't drive anymore.  Is not on home oxygen  or uses any inhalers/ nebs at baseline.    Found to be hypoxic on admit at 68% on RA, afebrile, WBC 15, PCT 0.11, SARs, flu, RSV neg, MRSA PCR neg.  CTA PE neg for PE but showing centrilobular and paraseptal emphysema with patchy consolidation and GGO bilaterally, moderate partially loculated left pleural effusion, and s/p radiation changes in upper and lower lobes bilaterally, interval enlargement of mediastinal lymph nodes, and enlarged pulmonic trunk indicative of pulmonary arterial hypertension. Admitted to [facility].  S/p empiric azithro and ctx on 1/14.  Oncology following, changed to [facility] and started on steroids started 1/15.  Pulmonary consulted 1/16 given CT findings and ongoing HHFNC of 60% and 50L.  Pt reports feeling better since admit.   Currently no SOB at rest and continues to deny cough.     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   worked up with cardiology dr hochrein and f/u prn   Nocturia 02/11/2021   Prostate CA (HCC) 2024   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   Wears glasses 02/11/2021     Significant Hospital Events: Including procedures, antibiotic start and stop dates in addition to other pertinent events   Antibiotics azithromycin  1/14, Zosyn  1/16-1/22, Bactrim  1/23>>  Interim History / Subjective:  07/25/24 Admit 1/15 steroids added, HHFNC 75%-80%, 50L 1/19 HF 45>60%  Objective    Blood pressure (!) 122/37, pulse (!) 55, temperature 97.7 F (36.5 C), temperature source Oral, resp. rate 18, height 6' (1.829 m), weight 63.8 kg, SpO2 95%.    FiO2 (%):  [70 %-75 %] 75 %   Intake/Output Summary (  Last 24 hours) at 08/03/2024 1531 Last data filed at 08/03/2024 0345 Gross per 24 hour  Intake 316.01 ml  Output 550 ml  Net -233.99 ml   Filed Weights   07/27/24 1822  Weight: 63.8 kg    Examination: Physical Exam General: Elderly, does not appear to be in distress HEENT: Moist oral mucosa Neuro: Alert and oriented x 3 CV: S1-S2 appreciated PULM: Clear breath sounds, decreased air movement GI: S1-S2 appreciated Extremities: Skin is warm and dry Skin: no  rashes   CTA angio 01/14 1. Negative for pulmonary embolus. 2. Multilobar pneumonia. 3. 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 Evaluation for recurrent disease is challenging due to surrounding acute ground-glass and consolidation. 4. Interval enlargement of mediastinal lymph nodes may be reactive in etiology. Recommend attention on follow-up. 5. Partially loculated moderate left pleural effusion. 6. Aortic atherosclerosis (ICD10-I70.0). Coronary artery calcification. 7. Enlarged pulmonic trunk, indicative of pulmonary arterial hypertension.  Resolved problem list   Assessment and Plan   Acute hypoxic respiratory failure Metastatic small cell lung cancer Durvalumab  induced pneumonitis versus pneumonia Concern for lymphangitic spread of cancer  Started on prednisone  currently on 60 mg with Bactrim  prophylaxis prednisone  taper (60mg  x 1 week, 50 mg x1w, 40mg x1w, 30x1w, 20mg x1w, 10mg  x1w) with bactrim  prophylaxis (1DS tablet Monday, Wednesday and Friday)  Continue pulmonary hygiene  PT OT  Ongoing goals of care discussion Current status is DNR/DNI  Jennet Epley, MD [place] [facility] Pager: See Amion         "

## 2024-08-03 NOTE — Consult Note (Signed)
 "                                                                                   Consultation Note Date: 08/03/2024   Patient Name: John [name]  DOB: 1945/08/08  MRN: [id number]  Age / Sex: 79 y.o., male  PCP: Practice, [facility] Referring Physician: Will Almarie MATSU, MD  Reason for Consultation: Establishing goals of care  HPI/Patient Profile: 79 y.o. male  with past medical history of  small cell lung cancer admitted on 07/25/2024 with shortness of breath, cough and decreased exercise tolerance .   Clinical Assessment and Goals of Care: Patient remains admitted to hospital medicine service for acute hypoxic respiratory failure pneumonitis from his recent chemotherapy versus pneumonia in the setting of life-limiting illness of small cell lung cancer. Patient is on antibiotics patient is on steroids oncology has also been consulted Palliative care consult for additional goals of care discussions has been requested Chart reviewed Patient seen and examined Discussed with patient as well as niece present at bedside Palliative medicine is specialized medical care for people living with serious illness. It focuses on providing relief from the symptoms and stress of a serious illness. The goal is to improve quality of life for both the patient and the family. Goals of care: Broad aims of medical therapy in relation to the patient's values and preferences. Our aim is to provide medical care aimed at enabling patients to achieve the goals that matter most to them, given the circumstances of their particular medical situation and their constraints.    NEXT OF KIN  Niece present at bedside No spouse no kids Also has nephew.   SUMMARY OF RECOMMENDATIONS   Assessment and plan 1.  Acute hypoxic respiratory failure in the setting of advanced small cell lung cancer Although patient was not on supplemental oxygen  at baseline prior to this hospitalization, currently he has severe  hypoxemia requiring escalation to heated high flow nasal cannula and imaging demonstrates multifocal bilateral airspace disease with concern for pneumonia versus immune related pneumonitis patient is also on systemic steroids.  Monitor patient's oxygen  requirements and monitor patient's disease course and overall hospital trajectory of illness.  There does exist a concern for poor pulmonary reserve and limited reversibility to this condition given underlying malignancy.  Pulmonology and oncology are actively following.  2.  Extensive stage small cell lung cancer status post radiation most recently status post immunotherapy. There remains a concern that current hospitalization likely reflects complications of advanced disease and/or treatment. Prognosis discussed with the patient, patient and niece have full understanding of serious life limiting nature of his illness. They wish to continue current mode of care for now, they wish to do a time-limited trial of current interventions and they wish to continue prognosis and treatment based discussions with patient's oncologist before making further goals of care discussions.  At present, palliative services to follow along peripherally.  3.  Goals of care/advance care planning Palliative care consulted to assist with goals of care discussions and for additional support Hence, CODE STATUS is readdressed today patient clearly reaffirms DNR/DNI status and does not wish for intubation or CPR. Emotional support  provided, discussed with patient about his symptom burden.  Patient wishes to continue current medical management and defers additional goals of care changes until further discussion with oncology.  4.  Symptom management/psychosocial support At present dyspnea is being managed with high flow oxygen  and steroids, monitor closely for comfort and respiratory distress Consider low-dose IV opioids for additional symptom support, at present, patient does have  oral oxycodone  immediate release available for pain and/or dyspnea management.  Plan: Maintain DNR/DNI status Continue current mode of care Continue current medical management as per primary pulmonology and oncology teams. Palliative care to follow closely for symptom control as well as for monitoring disease trajectory of illness so as to facilitate ongoing prognosis and goals of care discussions with the patient and family in coordination with oncology support. Thank you for the consult.  Code Status/Advance Care Planning: DNR   Symptom Management:     Palliative Prophylaxis:  Delirium Protocol and Frequent Pain Assessment    Psycho-social/Spiritual:  Desire for further Chaplaincy support:yes Additional Recommendations: Caregiving  Support/Resources  Prognosis:  Unable to determine  Discharge Planning: To Be Determined      Primary Diagnoses: Present on Admission:  Multifocal pneumonia   I have reviewed the medical record, interviewed the patient and family, and examined the patient. The following aspects are pertinent.  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   worked up with cardiology dr hochrein and f/u prn   Nocturia 02/11/2021   Prostate CA (HCC) 2024   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   Wears glasses 02/11/2021   Social History    Socioeconomic History   Marital status: Single    Spouse name: Not on file   Number of children: Not on file   Years of education: Not on file   Highest education level: Not on file  Occupational History   Not on file  Tobacco Use   Smoking status: Former    Current packs/day: 0.00    Average packs/day: 0.5 packs/day for 42.0 years (21.0 ttl pk-yrs)    Types: Cigarettes    Start date: 10/28/1972    Quit date: 10/29/2014    Years since quitting: 9.7   Smokeless tobacco: Never  Vaping Use   Vaping status: Never Used  Substance and Sexual Activity   Alcohol use: Never    Comment: occasionally blended whiskey    Drug use: Not Currently   Sexual activity: Not on file  Other Topics Concern   Not on file  Social History Narrative   Retired.  Lives alone.     Social Drivers of Health   Tobacco Use: Medium Risk (07/27/2024)   Patient History    Smoking Tobacco Use: Former    Smokeless Tobacco Use: Never    Passive Exposure: Not on Actuary  Strain: Not on file  Food Insecurity: No Food Insecurity (07/27/2024)   Epic    Worried About Programme Researcher, Broadcasting/film/video in the Last Year: Never true    Ran Out of Food in the Last Year: Never true  Transportation Needs: No Transportation Needs (07/27/2024)   Epic    Lack of Transportation (Medical): No    Lack of Transportation (Non-Medical): No  Physical Activity: Not on file  Stress: Not on file  Social Connections: Not on file  Depression (PHQ2-9): Low Risk (06/27/2024)   Depression (PHQ2-9)    PHQ-2 Score: 0  Alcohol Screen: Not on file  Housing: Low Risk (07/27/2024)   Epic    Unable to Pay for Housing in the Last Year: No    Number of Times Moved in the Last Year: 0    Homeless in the Last Year: No  Utilities: Not At Risk (07/27/2024)   Epic    Threatened with loss of utilities: No  Health Literacy: Not on file   Family History  Problem Relation Age of Onset   Hyperlipidemia Mother    Asthma Mother    Heart  failure Mother    Aneurysm Father    Hyperlipidemia Sister    Asthma Sister    Scheduled Meds:  Chlorhexidine  Gluconate Cloth  6 each Topical Daily   enoxaparin  (LOVENOX ) injection  40 mg Subcutaneous Q24H   feeding supplement  237 mL Oral BID BM   guaiFENesin   600 mg Oral BID   pneumococcal 20-valent conjugate vaccine  0.5 mL Intramuscular Tomorrow-1000   [START ON 08/09/2024] predniSONE   50 mg Oral Q breakfast   Followed by   NOREEN ON 08/16/2024] predniSONE   40 mg Oral Q breakfast   Followed by   NOREEN ON 08/23/2024] predniSONE   30 mg Oral Q breakfast   Followed by   NOREEN ON 08/30/2024] predniSONE   20 mg Oral Q breakfast   Followed by   NOREEN ON 09/06/2024] predniSONE   10 mg Oral Q breakfast   predniSONE   60 mg Oral Q breakfast   senna  1 tablet Oral BID   sodium chloride  flush  10-40 mL Intracatheter Q12H   sulfamethoxazole -trimethoprim   1 tablet Oral Q M,W,F   Continuous Infusions: PRN Meds:.acetaminophen  **OR** acetaminophen , ALPRAZolam, bisacodyl , guaiFENesin -dextromethorphan, hydrALAZINE , ipratropium-albuterol , melatonin, mouth rinse, oxyCODONE , polyethylene glycol, sodium chloride  flush, traZODone  Medications Prior to Admission:  Prior to Admission medications  Medication Sig Start Date End Date Taking? Authorizing Provider  albuterol  (VENTOLIN  HFA) 108 (90 Base) MCG/ACT inhaler Inhale 1-2 puffs into the lungs every 6 (six) hours as needed for wheezing or shortness of breath. 05/12/24  Yes Yolande Lamar BROCKS, MD  amLODipine (NORVASC) 10 MG tablet Take 10 mg by mouth daily.   Yes [provider]  APIXABAN  (ELIQUIS ) VTE STARTER PACK (10MG  AND 5MG ) Take as directed on package: start with two-5mg  tablets twice daily for 7 days. On day 8, switch to one-5mg  tablet twice daily. Patient not taking: Reported on 07/25/2024 05/12/24   Yolande Lamar BROCKS, MD  lidocaine -prilocaine  (EMLA ) cream Apply 1 Application topically as needed. Patient not taking: Reported on 07/25/2024  08/25/23   Heilingoetter, Cassandra L, PA-C  memantine  (NAMENDA ) 10 MG tablet Take 1 tablet (10 mg total) by mouth 2 (two) times daily. Patient not taking: Reported on 07/25/2024 12/07/23   Lanell Donald Stagger, PA-C  memantine  (NAMENDA ) 5 MG tablet Begin this prescription the first day of brain radiation. Week 1: take one tablet po qam. Week 2: take one tablet  qam and qpm. Week 3: take two tablets qam, and one tablet po q pm. Week 4: take two tablets qam and qpm. Fill subsequent prescription q month. Patient not taking: Reported on 07/25/2024 12/07/23   Lanell Donald Stagger, PA-C  prochlorperazine  (COMPAZINE ) 10 MG tablet Take 1 tablet (10 mg total) by mouth every 6 (six) hours as needed. Patient not taking: Reported on 07/25/2024 08/25/23   Heilingoetter, Cassandra L, PA-C  psyllium (METAMUCIL SMOOTH TEXTURE) 58.6 % powder Take 1 packet by mouth 3 (three) times daily. Patient not taking: Reported on 07/25/2024 05/29/24   Kingsley, Victoria K, DO  senna-docusate (SENOKOT-S) 8.6-50 MG tablet Take 1 tablet by mouth daily. Patient not taking: Reported on 07/25/2024 05/29/24   Ellouise Fine K, DO   Allergies[1] Review of Systems +shortness of breath  Physical Exam Weak appearing gentleman resting in chair On heated high flow nasal cannula Monitor noted Cardiovascular-irregular Normal respiratory effort, however patient is not able to speak in full sentences for longer and subjectively feels a sensation of dyspnea and tachypnea Abdomen is not distended  Vital Signs: BP (!) 128/36   Pulse (!) 53   Temp 97.7 F (36.5 C) (Oral)   Resp (!) 24   Ht 6' (1.829 m)   Wt 63.8 kg   SpO2 90%   BMI 19.08 kg/m  Pain Scale: 0-10 POSS *See Group Information*: S-Acceptable,Sleep, easy to arouse Pain Score: 0-No pain   SpO2: SpO2: 90 % O2 Device:SpO2: 90 % O2 Flow Rate: .O2 Flow Rate (L/min): 60 L/min  IO: Intake/output summary:  Intake/Output Summary (Last 24 hours) at 08/03/2024 1400 Last  data filed at 08/03/2024 0345 Gross per 24 hour  Intake 316.01 ml  Output 550 ml  Net -233.99 ml    LBM: Last BM Date : 08/02/24 Baseline Weight: Weight: 63.8 kg Most recent weight: Weight: 63.8 kg     Palliative Assessment/Data:   PPS 50%   Time In:  12 Time Out:  1315 Time Total:  75 Greater than 50%  of this time was spent counseling and coordinating care related to the above assessment and plan.  Signed by: Lonia Serve, MD   Please contact Palliative Medicine Team phone at [phone] for questions and concerns.  For individual provider: See Amion                 [1] No Known Allergies  "

## 2024-08-04 DIAGNOSIS — J188 Other pneumonia, unspecified organism: Secondary | ICD-10-CM | POA: Diagnosis not present

## 2024-08-04 DIAGNOSIS — J704 Drug-induced interstitial lung disorders, unspecified: Secondary | ICD-10-CM

## 2024-08-04 DIAGNOSIS — J9601 Acute respiratory failure with hypoxia: Secondary | ICD-10-CM | POA: Diagnosis not present

## 2024-08-04 DIAGNOSIS — C7800 Secondary malignant neoplasm of unspecified lung: Secondary | ICD-10-CM | POA: Diagnosis not present

## 2024-08-04 DIAGNOSIS — T451X5A Adverse effect of antineoplastic and immunosuppressive drugs, initial encounter: Secondary | ICD-10-CM

## 2024-08-04 LAB — HEPATITIS B SURFACE ANTIGEN: Hepatitis B Surface Ag: NONREACTIVE

## 2024-08-04 MED ORDER — SODIUM CHLORIDE 0.9 % IV SOLN
5.0000 mg/kg | Freq: Once | INTRAVENOUS | Status: DC
  Filled 2024-08-04: qty 30

## 2024-08-04 MED ORDER — SODIUM CHLORIDE 0.9 % IV SOLN
5.0000 mg/kg | Freq: Once | INTRAVENOUS | Status: AC
  Administered 2024-08-04: 300 mg via INTRAVENOUS
  Filled 2024-08-04: qty 30

## 2024-08-04 NOTE — Progress Notes (Addendum)
 "  NAME:  John [name], MRN:  [id number], DOB:  July 30, 1945, LOS: 10 ADMISSION DATE:  07/25/2024,  History of Present Illness:  26 yoM with PMH significant for former smoker (quit 20-84yrs ago), metastatic small cell lung cancer., prostate cancer 2013 and urothelial carcinoma 2022, left kidney lesion s/p SBRT 04/2024, HTN, aortic regurgitation, type 1 second degree AV block, cataracts    Pt is followed by Dr. Sherrod with oncology for metastatic small cell lung cancer.  Initially dx 07/2023, s/p palliative RT completed 09/19/23, and palliative chemo with carboplatin  + etoposide  + durvalumab , currently maintained on durvalumab , last dose 12/17.  On CXR 12/30 developed bilateral lobe patchy infiltrates.  Started having some exertional SOB.  Was called in rx doxycycline that he started 1/13 and then presented to ER 1/14 due to worsening SOB.  Denies cough but reports several days ago had speck of blood but nothing since.   Denies fever, chills, cough, chest pain, sick contacts, orthopnea, N/V, dysphagia or choking episodes, or LE swelling/ pain.  Lives alone, uses a cane, and reports is otherwise independent in ADLs except for doesn't drive anymore.  Is not on home oxygen  or uses any inhalers/ nebs at baseline.    Found to be hypoxic on admit at 68% on RA, afebrile, WBC 15, PCT 0.11, SARs, flu, RSV neg, MRSA PCR neg.  CTA PE neg for PE but showing centrilobular and paraseptal emphysema with patchy consolidation and GGO bilaterally, moderate partially loculated left pleural effusion, and s/p radiation changes in upper and lower lobes bilaterally, interval enlargement of mediastinal lymph nodes, and enlarged pulmonic trunk indicative of pulmonary arterial hypertension. Admitted to [facility].  S/p empiric azithro and ctx on 1/14.  Oncology following, changed to [facility] and started on steroids started 1/15.  Pulmonary consulted 1/16 given CT findings and ongoing HHFNC of 60% and 50L.  Pt reports feeling better since admit.   Currently no SOB at rest and continues to deny cough.    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   worked up with cardiology dr hochrein and f/u prn   Nocturia 02/11/2021   Prostate CA (HCC) 2024   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   Wears glasses 02/11/2021     Significant Hospital Events: Including procedures, antibiotic start and stop dates in addition to other pertinent events   Antibiotics azithromycin  1/14, Zosyn  1/16-1/22, Bactrim  1/23>>  Interim History / Subjective:  07/25/24 Admit 1/15 steroids added, HHFNC 75%-80%, 50L 1/19 HF 45>60%  Objective    Blood pressure 124/79, pulse (!) 44, temperature (!) 97.5 F (36.4 C), temperature source Axillary, resp. rate 18, height 6' (1.829 m), weight 63.8 kg, SpO2 94%.    FiO2 (%):  [75 %-77 %] 77 %   Intake/Output Summary (Last  24 hours) at 08/04/2024 0950 Last data filed at 08/04/2024 0500 Gross per 24 hour  Intake 480 ml  Output 2400 ml  Net -1920 ml   Filed Weights   07/27/24 1822  Weight: 63.8 kg   Examination: Physical Exam General: Elderly, does not appear to be in distress HEENT: Moist oral mucosa Neuro: Alert and oriented x 3  CV: S1-S2 appreciated PULM: Decreased air movement bilaterally GI: S1-S2 appreciated Extremities: Skin is warm and dry Skin: No skin  rashes  CTA angio 01/14 1. Negative for pulmonary embolus. 2. Multilobar pneumonia. 3. 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 Evaluation for recurrent disease is challenging due to surrounding acute ground-glass and consolidation. 4. Interval enlargement of mediastinal lymph nodes may be reactive in etiology. Recommend attention on follow-up. 5. Partially loculated moderate left pleural effusion. 6. Aortic atherosclerosis (ICD10-I70.0). Coronary artery calcification. 7. Enlarged pulmonic trunk, indicative of pulmonary arterial hypertension.  Resolved problem list   Assessment and Plan   Ongoing acute hypoxic respiratory failure Metastatic small cell lung cancer Durvalumab  induced pneumonitis Concern for lymphangitic spread of cancer   Durvalumab  induced pneumonitis - He had had radiation previously, Durvalumab  pneumonitis incidence increases with previous radiation Started on prednisone  on 60 mg with Bactrim  prophylaxis prednisone  taper (60mg  x 1 week, 50 mg x1w, 40mg x1w, 30x1w, 20mg x1w, 10mg  x1w) with bactrim  prophylaxis (1DS tablet Monday, Wednesday and Friday)  With his hypoxic respiratory failure Has been on prednisone  60 mg daily 1/22, prior to this was on Solu-Medrol  60 mg daily from 1/15 He is on high flow nasal cannula at 60 L 70% FiO2-has not been able to wean down - Have not seen any improvement in oxygen  requirement  I do believe that there is an urgent need for immunosuppression, benefit outweighs the risk with hepatitis and TB-Will send test  Will consider infliximab , obtain QuantiFERON gold, hepatitis B panel  Continue pulmonary hygiene  PT OT  Ongoing goals of care discussions  Currently patient is DNR/DNI  Jennet Epley, MD [place] Pager: See Amion           "

## 2024-08-04 NOTE — Progress Notes (Signed)
 "                                                                                                                                                                                                          Daily Progress Note   Patient Name: John [name]       Date: 08/04/2024 DOB: June 12, 1946  Age: 79 y.o. MRN#: [id number] Attending Physician: Will Almarie MATSU, MD Primary Care Physician: Practice, [facility] Admit Date: 07/25/2024  Reason for Consultation/Follow-up: Establishing goals of care, Non pain symptom management, and Pain control  Subjective:  Patient resting in chair, states that he didn't rest well overnight, admits to mild sensation of dyspnea/tachypnea. Discussed with patient about using PRN Oxy IR PRN for pain/shortness of breath.   O2 sats 97% - remains on 60 L per minute, 77% FiO2. HHF [place].   Length of Stay: 10  Current Medications: Scheduled Meds:   Chlorhexidine  Gluconate Cloth  6 each Topical Daily   enoxaparin  (LOVENOX ) injection  40 mg Subcutaneous Q24H   feeding supplement  237 mL Oral BID BM   guaiFENesin   600 mg Oral BID   pneumococcal 20-valent conjugate vaccine  0.5 mL Intramuscular Tomorrow-1000   [START ON 08/09/2024] predniSONE   50 mg Oral Q breakfast   Followed by   NOREEN ON 08/16/2024] predniSONE   40 mg Oral Q breakfast   Followed by   NOREEN ON 08/23/2024] predniSONE   30 mg Oral Q breakfast   Followed by   NOREEN ON 08/30/2024] predniSONE   20 mg Oral Q breakfast   Followed by   NOREEN ON 09/06/2024] predniSONE   10 mg Oral Q breakfast   predniSONE   60 mg Oral Q breakfast   senna  1 tablet Oral BID   sodium chloride  flush  10-40 mL Intracatheter Q12H   sulfamethoxazole -trimethoprim   1 tablet Oral Q M,W,F    Continuous Infusions:   PRN Meds: acetaminophen  **OR** acetaminophen , ALPRAZolam , bisacodyl , guaiFENesin -dextromethorphan, hydrALAZINE , ipratropium-albuterol , melatonin, mouth rinse, oxyCODONE , polyethylene glycol, sodium chloride   flush, traZODone   Physical Exam         Awake alert Resting in chair No edema Regular work of breathing Monitor noted  Vital Signs: BP 124/79   Pulse (!) 44   Temp (!) 97.5 F (36.4 C) (Axillary)   Resp 18   Ht 6' (1.829 m)   Wt 63.8 kg   SpO2 94%   BMI 19.08 kg/m  SpO2: SpO2: 94 % O2 Device: O2 Device: Heated High Flow Nasal Cannula O2 Flow Rate: O2 Flow Rate (L/min): 60 L/min  Intake/output  summary:  Intake/Output Summary (Last 24 hours) at 08/04/2024 0935 Last data filed at 08/04/2024 0500 Gross per 24 hour  Intake 480 ml  Output 2400 ml  Net -1920 ml   LBM: Last BM Date : 08/02/24 Baseline Weight: Weight: 63.8 kg Most recent weight: Weight: 63.8 kg       Palliative Assessment/Data:      Patient Active Problem List   Diagnosis Date Noted   Palliative care by specialist 08/03/2024   General weakness 08/03/2024   History of lung cancer 07/26/2024   Shortness of breath 07/26/2024   Sepsis (HCC) 07/26/2024   Multifocal pneumonia 07/25/2024   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) 04/05/2024   Port-A-Cath in place 09/26/2023   Encounter for antineoplastic chemotherapy 09/26/2023   Encounter for antineoplastic immunotherapy 09/26/2023   Goals of care, counseling/discussion 08/25/2023   Malignant neoplasm of right upper lobe of lung (HCC) 08/21/2023   Malignant neoplasm of upper lobe of left lung (HCC) 08/21/2023   Abnormal CT of the chest 08/08/2023   Nocturia    Mobitz type 1 second degree atrioventricular block    Hypertensive cardiovascular disease    Hydrocele    History of prostate cancer    Frequency of urination     Palliative Care Assessment & Plan   Patient Profile:   1. Acute hypoxic respiratory failure in the setting of advanced small cell lung cancer The patient was not previously oxygen  dependent prior to this admission but now has significant hypoxemia requiring heated high-flow nasal cannula support. Imaging demonstrates multifocal bilateral  airspace opacities concerning for pneumonia versus immune-mediated pneumonitis; the patient is currently receiving systemic corticosteroids. Oxygen  requirements, respiratory status, and overall clinical trajectory will continue to be closely monitored. Given the extent of underlying malignancy, there is concern for limited pulmonary reserve and incomplete reversibility. Pulmonology and Oncology are actively involved in ongoing management.  2. Extensive-stage small cell lung cancer, status post radiation and recent immunotherapy This hospitalization is felt to likely represent complications related to advanced malignancy and/or treatment effects. Patient and niece  wish to proceed with a time-limited trial of current medical therapies and continue prognostic and treatment-focused discussions with the primary oncologist prior to considering additional changes in goals of care. Palliative Medicine will continue to follow in a supportive role.  3. Goals of care / advance care planning Palliative Care was consulted to assist with goals-of-care discussions and provide additional support. Code status remains DNR/DNI as patient does not wish to undergo intubation or cardiopulmonary resuscitation. Emotional support was provided, and symptom burden was reviewed. The patient wishes to continue the current plan of care and defer further goals-of-care decisions until additional discussion with Oncology.  4. Symptom management and psychosocial support Dyspnea is currently being managed with high-flow oxygen  and corticosteroids. The patient was counseled regarding the role of opioids for symptom relief, including the use of oral oxycodone  immediate-release for pain and/or dyspnea, and he verbalized understanding. Symptoms will be monitored closely, with escalation of comfort-focused interventions as needed.  Plan:  Maintain DNR/DNI status  Continue current medical management per primary, Pulmonology, and Oncology  teams  Continue time-limited trial of current interventions  Palliative Care to follow for ongoing symptom management, psychosocial support, and reassessment of prognosis and goals of care in coordination with Oncology   Code Status:    Code Status Orders  (From admission, onward)           Start     Ordered   07/27/24 1242  Do not attempt  resuscitation (DNR)- Limited -Do Not Intubate (DNI)  (Code Status)  Continuous       Question Answer Comment  If pulseless and not breathing No CPR or chest compressions.   In Pre-Arrest Conditions (Patient Is Breathing and Has A Pulse) Do not intubate. Provide all appropriate non-invasive medical interventions. Avoid ICU transfer unless indicated or required.   Consent: Discussion documented in EHR or advanced directives reviewed      07/27/24 1241           Code Status History     Date Active Date Inactive Code Status Order ID Comments User Context   07/25/2024 1503 07/27/2024 1241 Full Code [id number]  Arlice Reichert, MD ED   09/19/2023 1558 09/20/2023 0514 Full Code [id number]  Johann Sieving, MD HOV       Prognosis:  Guarded.   Discharge Planning: To Be Determined  Care plan was discussed with patient, [facility] MD.   Thank you for allowing the Palliative Medicine Team to assist in the care of this patient. I personally spent a total of 35 minutes in the care of the patient today including preparing to see the patient, getting/reviewing separately obtained history, performing a medically appropriate exam/evaluation, counseling and educating, referring and communicating with other health care professionals, documenting clinical information in the EHR, and communicating results.      Greater than 50%  of this time was spent counseling and coordinating care related to the above assessment and plan.  Lonia Serve, MD  Please contact Palliative Medicine Team phone at [phone] for questions and concerns.       "

## 2024-08-04 NOTE — Progress Notes (Signed)
 " PROGRESS NOTE    John [name]  FMW:[id number] DOB: July 04, 1946 DOA: 07/25/2024 PCP: Practice, [facility]   Brief Narrative: 79 y.o. 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 Sherrod.  Underwent radiation in the past.  Received last dose of immunotherapy with durvalumab  12/16. 12/30, he had a chest x-ray done which resulted 10 days later on 1/10 to show bilateral pneumonia.  Patient continued to have symptoms at that time but then suddenly started having shortness of breath, cough and decreased exercise tolerance.SABRA  He does not have any supplemental oxygen  at home. 1/11, he called Dr. Jeannett office and was called in a prescription for doxycycline which he started 1/13.   1/14, patient felt significantly short of breath and hence presented to the ED   In the ED, patient is afebrile, heart rate in 100s, blood pressure in 110s, initially very hypoxic at 68% on room air and required 15 L oxygen  by nasal cannula.   VBG with pH 7.47, pCO2 41, O2 sat 99 CBC with WC count 15, lactic acid normal, CMP unremarkable Respiratory virus panel negative   CT angio chest was negative for pulm embolism but  -showed patchy consolidation and ground glass bilaterally suggestive of multifocal pneumonia. -partially loculated moderate left pleural effusion. -Enlarged pulmonic trunk, indicative of pulmonary arterial hypertension.   Blood culture collected Patient was started on IV Rocephin , azithromycin  Admitted to [facility]   Subsequently, patient's oxygen  requirement worsened significantly up to 50 L heated high flow nasal cannula. Patient remains in stepdown unit closely followed by pulmonary.     Assessment & Plan:    Principal Problem:   Multifocal pneumonia Active Problems:   History of lung cancer   Shortness of breath   Sepsis (HCC)   Palliative care by specialist   General weakness  Acute respiratory failure with hypoxia ?  Pneumonitis from recent chemotherapy vs pneumonia in the setting of small cell lung cancer.   CT of the chest did not reveal any pulmonary embolism but showed multifocal infiltrates consistent with consolidation.  Patient admitted with gradually worsening shortness of breath.  Saturation was mid 60s on room air in the ER.  Initially was given Rocephin  and azithromycin  then changed to Zosyn .  Prior hospitalist discussed with Dr. Sherrod recommended prednisone  for 7 days.  His oxygen  requirement seems to be going up to 60 L.  He seems anxious and disappointed and depressed.  He is DNR/DNI.  Appreciate palliative care seeing patient. Solu-Medrol  changed to prednisone  on the 22nd  Chest x-ray from 21st showed multifocal bilateral airspace disease with right apical sparing small left pleural effusion and mild elevation of the left hemidiaphragm.   Follow-up chest x-ray stable bilateral infiltrates small left pleural effusion he finished a course of Zosyn .   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 Sherrod.  Underwent radiation in the past.  Received last dose of immunotherapy with durvalumab  12/16. Oncology following   Hypertension H/o moderate aortic regurgitation CT angio showed enlarged pulmonary trunk indicating pulmonary artery hypertension. Last echo from 2018 had shown moderate AI.  Repeat echo report pending PTA meds- amlodipine 10 mg daily.  Currently  on hold.  Blood pressure continue to be soft 111/46.   H/o prostate cancer s/p brachytherapy H/o bladder cancer   Estimated body mass index is 19.08 kg/m as calculated from the following:   Height as of this  encounter: 6' (1.829 m).   Weight as of this encounter: 63.8 kg.  DVT prophylaxis: Lovenox   code Status: DNR/DNI Family Communication: None at bedside  disposition Plan:  Status is: Inpatient Remains inpatient appropriate because: Acute illness   Consultants: Pulmonologist and oncologist  Procedures: None  Antimicrobials: Zosyn   Subjective:   no events overnight Remains on high flow 60 L  Objective: Vitals:   08/04/24 0404 08/04/24 0516 08/04/24 0600 08/04/24 0812  BP:  (!) 109/53 124/79   Pulse: (!) 45 88 84 (!) 44  Resp: 17 (!) 33 17 18  Temp:    (!) 97.5 F (36.4 C)  TempSrc:    Axillary  SpO2: 95% 97% 97% 94%  Weight:      Height:        Intake/Output Summary (Last 24 hours) at 08/04/2024 9178 Last data filed at 08/04/2024 0500 Gross per 24 hour  Intake 600 ml  Output 2400 ml  Net -1800 ml   Filed Weights   07/27/24 1822  Weight: 63.8 kg    Examination:  General exam: Appears in mild distress Respiratory system:  Respiratory effort normal.  Coarse breath sounds Cardiovascular system: irregular Gastrointestinal system: Abdomen is nondistended, soft and nontender. No organomegaly or masses felt. Normal bowel sounds heard. Central nervous system: Alert and oriented. No focal neurological deficits. Extremities: no edema.   Data Reviewed: I have personally reviewed following labs and imaging studies  CBC: Recent Labs  Lab 07/30/24 0619 08/02/24 0351 08/03/24 0452  WBC 17.3* 15.7* 18.7*  NEUTROABS 15.0*  --   --   HGB 11.6* 12.0* 11.4*  HCT 35.5* 38.6* 35.7*  MCV 95.9 98.2 97.5  PLT 189 198 199   Basic Metabolic Panel: Recent Labs  Lab 07/30/24 0619 08/02/24 0351 08/03/24 0452  NA 139 142 142  K 4.3 4.1 4.1  CL 102 104 105  CO2 32 30 32  GLUCOSE 90 90 155*  BUN 19 19 19   CREATININE 0.90 0.95 0.78  CALCIUM 9.8 9.6 9.4  MG 2.4  --   --    GFR: Estimated Creatinine Clearance: 67.6 mL/min (by C-G formula based on SCr of 0.78  mg/dL). Liver Function Tests: Recent Labs  Lab 08/02/24 0351  AST 31  ALT 44  ALKPHOS 105  BILITOT 0.7  PROT 6.0*  ALBUMIN 3.0*   No results for input(s): LIPASE, AMYLASE in the last 168 hours. No results for input(s): AMMONIA in the last 168 hours. Coagulation Profile: No results for input(s): INR, PROTIME in the last 168 hours.  Cardiac Enzymes: No results for input(s): CKTOTAL, CKMB, CKMBINDEX, TROPONINI in the last 168 hours. BNP (last 3 results) Recent Labs    05/12/24 1726 05/29/24 1826 07/25/24 1314  PROBNP 113.0 113.0 380.0*   HbA1C: No results for input(s): HGBA1C in the last 72 hours. CBG: No results for input(s): GLUCAP in the last 168 hours. Lipid Profile: No results for input(s): CHOL, HDL, LDLCALC, TRIG, CHOLHDL, LDLDIRECT in the last 72 hours. Thyroid  Function Tests: No results for input(s): TSH, T4TOTAL, FREET4, T3FREE, THYROIDAB in the last 72 hours. Anemia Panel: No results for input(s): VITAMINB12, FOLATE, FERRITIN, TIBC, IRON, RETICCTPCT in the last 72 hours. Sepsis Labs: Recent Labs  Lab 07/28/24 1521  PROCALCITON <0.10  Recent Results (from the past 240 hours)  Blood Culture (routine x 2)     Status: None   Collection Time: 07/25/24  1:25 PM   Specimen: BLOOD RIGHT FOREARM  Result Value Ref Range Status   Specimen Description   Final    BLOOD RIGHT FOREARM Performed at [facility], [address], [place], [place] [address]    Special Requests   Final    BOTTLES DRAWN AEROBIC AND ANAEROBIC Blood Culture adequate volume Performed at [facility], [address], [place], [place] [address]    Culture   Final    NO GROWTH 5 DAYS Performed at [facility], [address] [place], [place] [address]    Report Status 07/30/2024 FINAL  Final  Blood Culture (routine x 2)     Status: None   Collection Time: 07/25/24  1:33 PM   Specimen: BLOOD  Result  Value Ref Range Status   Specimen Description   Final    BLOOD LEFT ANTECUBITAL Performed at [facility], [address] [place], [place] [address]    Special Requests   Final    BOTTLES DRAWN AEROBIC AND ANAEROBIC Blood Culture adequate volume Performed at [facility], [address], [place], [place] [address]    Culture   Final    NO GROWTH 5 DAYS Performed at [facility], [address], [place], [place] [address]    Report Status 07/30/2024 FINAL  Final  Resp panel by RT-PCR (RSV, Flu A&B, Covid) Anterior Nasal Swab     Status: None   Collection Time: 07/25/24  1:47 PM   Specimen: Anterior Nasal Swab  Result Value Ref Range Status   SARS Coronavirus 2 by RT PCR NEGATIVE NEGATIVE Final    Comment: (NOTE) SARS-CoV-2 target nucleic acids are NOT DETECTED.  The SARS-CoV-2 RNA is generally detectable in upper respiratory specimens during the acute phase of infection. The lowest concentration of SARS-CoV-2 viral copies this assay can detect is 138 copies/mL. A negative result does not preclude SARS-Cov-2 infection and should not be used as the sole basis for treatment or other patient management decisions. A negative result may occur with  improper specimen collection/handling, submission of specimen other than nasopharyngeal swab, presence of viral mutation(s) within the areas targeted by this assay, and inadequate number of viral copies(<138 copies/mL). A negative result must be combined with clinical observations, patient history, and epidemiological information. The expected result is Negative.  Fact Sheet for Patients:  bloggercourse.com  Fact Sheet for Healthcare Providers:  seriousbroker.it  This test is no t yet approved or cleared by the [place]  FDA and  has been authorized for detection and/or diagnosis of SARS-CoV-2 by FDA under an Emergency Use Authorization  (EUA). This EUA will remain  in effect (meaning this test can be used) for the duration of the COVID-19 declaration under Section 564(b)(1) of the Act, 21 U.S.C.section 360bbb-3(b)(1), unless the authorization is terminated  or revoked sooner.       Influenza A by PCR NEGATIVE NEGATIVE Final   Influenza B by PCR NEGATIVE NEGATIVE Final    Comment: (NOTE) The Xpert Xpress SARS-CoV-2/FLU/RSV plus assay is intended as an aid in the diagnosis of influenza from Nasopharyngeal swab specimens and should not be used as a sole basis for treatment. Nasal washings and aspirates are unacceptable for Xpert Xpress SARS-CoV-2/FLU/RSV testing.  Fact Sheet for Patients: bloggercourse.com  Fact Sheet for Healthcare Providers: seriousbroker.it  This test is not yet approved or cleared by the [place]  FDA and has been authorized for detection and/or diagnosis of SARS-CoV-2 by FDA under an Emergency Use Authorization (EUA). This EUA will remain in effect (meaning this test can be used) for the duration of the COVID-19 declaration under Section 564(b)(1) of the Act, 21 U.S.C. section 360bbb-3(b)(1), unless the authorization is terminated or revoked.     Resp Syncytial Virus by PCR NEGATIVE NEGATIVE Final    Comment: (NOTE) Fact Sheet for Patients: bloggercourse.com  Fact Sheet for Healthcare Providers: seriousbroker.it  This test is not yet approved or cleared by the [place]  FDA and has been authorized for detection and/or diagnosis of SARS-CoV-2 by FDA under an Emergency Use Authorization (EUA). This EUA will remain in effect (meaning this test can be used) for the duration of the COVID-19 declaration under Section 564(b)(1) of the Act, 21 U.S.C. section 360bbb-3(b)(1), unless the authorization is terminated or revoked.  Performed at [facility], [address], [place], [place] [address]   MRSA Next Gen by PCR, Nasal     Status: None   Collection Time: 07/26/24  2:43 PM   Specimen: Nasal Mucosa; Nasal Swab  Result Value Ref Range Status   MRSA by PCR Next Gen NOT DETECTED NOT DETECTED Final    Comment: (NOTE) The GeneXpert MRSA Assay (FDA approved for NASAL specimens only), is one component of a comprehensive MRSA colonization surveillance program. It is not intended to diagnose MRSA infection nor to guide or monitor treatment for MRSA infections. Test performance is not FDA approved in patients less than 12 years old. Performed at [facility], [address] [place], [place] [address]          Radiology Studies: DG CHEST PORT 1 VIEW Result Date: 08/03/2024 CLINICAL DATA:  Shortness of breath EXAM: PORTABLE CHEST 1 VIEW COMPARISON:  Two days ago FINDINGS: The heart size and mediastinal contours are within normal limits. Right internal jugular Port-A-Cath is unchanged. Stable bilateral lung opacities are noted concerning for multifocal pneumonia. Elevated left hemidiaphragm is noted. Minimal left pleural effusion is noted. The visualized skeletal structures are unremarkable. IMPRESSION: Stable bilateral lung opacities are noted concerning for multifocal pneumonia. Minimal left pleural effusion is noted. Electronically Signed   By: Lynwood Landy Raddle M.D.   On: 08/03/2024 16:04      Scheduled Meds:  Chlorhexidine  Gluconate Cloth  6 each Topical Daily   enoxaparin  (LOVENOX ) injection  40 mg Subcutaneous Q24H   feeding supplement  237 mL Oral BID BM   guaiFENesin   600 mg Oral BID   pneumococcal 20-valent conjugate vaccine  0.5 mL Intramuscular Tomorrow-1000   [START ON 08/09/2024] predniSONE   50 mg Oral Q breakfast   Followed by   NOREEN ON 08/16/2024] predniSONE   40 mg Oral Q breakfast   Followed by   NOREEN ON 08/23/2024] predniSONE   30 mg Oral Q breakfast   Followed by   NOREEN ON 08/30/2024] predniSONE   20 mg Oral Q  breakfast   Followed by   NOREEN ON 09/06/2024] predniSONE   10 mg Oral Q breakfast   predniSONE   60 mg Oral Q breakfast   senna  1 tablet Oral BID   sodium chloride  flush  10-40 mL Intracatheter Q12H   sulfamethoxazole -trimethoprim   1 tablet Oral Q M,W,F   Continuous Infusions:     LOS: 10 days     John KANDICE Hoots, MD 08/04/2024, 8:21 AM   "

## 2024-08-05 DIAGNOSIS — T451X5A Adverse effect of antineoplastic and immunosuppressive drugs, initial encounter: Secondary | ICD-10-CM | POA: Diagnosis not present

## 2024-08-05 DIAGNOSIS — J9601 Acute respiratory failure with hypoxia: Secondary | ICD-10-CM | POA: Diagnosis not present

## 2024-08-05 DIAGNOSIS — C7800 Secondary malignant neoplasm of unspecified lung: Secondary | ICD-10-CM | POA: Diagnosis not present

## 2024-08-05 DIAGNOSIS — J188 Other pneumonia, unspecified organism: Secondary | ICD-10-CM | POA: Diagnosis not present

## 2024-08-05 DIAGNOSIS — J704 Drug-induced interstitial lung disorders, unspecified: Secondary | ICD-10-CM | POA: Diagnosis not present

## 2024-08-05 NOTE — Progress Notes (Signed)
 "  NAME:  John [name], MRN:  [id number], DOB:  04-Mar-1946, LOS: 11 ADMISSION DATE:  07/25/2024,  History of Present Illness:  55 yoM with PMH significant for former smoker (quit 20-68yrs ago), metastatic small cell lung cancer., prostate cancer 2013 and urothelial carcinoma 2022, left kidney lesion s/p SBRT 04/2024, HTN, aortic regurgitation, type 1 second degree AV block, cataracts    Pt is followed by Dr. Sherrod with oncology for metastatic small cell lung cancer.  Initially dx 07/2023, s/p palliative RT completed 09/19/23, and palliative chemo with carboplatin  + etoposide  + durvalumab , currently maintained on durvalumab , last dose 12/17.  On CXR 12/30 developed bilateral lobe patchy infiltrates.  Started having some exertional SOB.  Was called in rx doxycycline that he started 1/13 and then presented to ER 1/14 due to worsening SOB.  Denies cough but reports several days ago had speck of blood but nothing since.   Denies fever, chills, cough, chest pain, sick contacts, orthopnea, N/V, dysphagia or choking episodes, or LE swelling/ pain.  Lives alone, uses a cane, and reports is otherwise independent in ADLs except for doesn't drive anymore.  Is not on home oxygen  or uses any inhalers/ nebs at baseline.    Found to be hypoxic on admit at 68% on RA, afebrile, WBC 15, PCT 0.11, SARs, flu, RSV neg, MRSA PCR neg.  CTA PE neg for PE but showing centrilobular and paraseptal emphysema with patchy consolidation and GGO bilaterally, moderate partially loculated left pleural effusion, and s/p radiation changes in upper and lower lobes bilaterally, interval enlargement of mediastinal lymph nodes, and enlarged pulmonic trunk indicative of pulmonary arterial hypertension. Admitted to [facility].  S/p empiric azithro and ctx on 1/14.  Oncology following, changed to [facility] and started on steroids started 1/15.  Pulmonary consulted 1/16 given CT findings and ongoing HHFNC of 60% and 50L.  Pt reports feeling better since admit.   Currently no SOB at rest and continues to deny cough.    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   worked up with cardiology dr hochrein and f/u prn   Nocturia 02/11/2021   Prostate CA (HCC) 2024   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   Wears glasses 02/11/2021   Significant Hospital Events: Including procedures, antibiotic start and stop dates in addition to other pertinent events   Antibiotics azithromycin  1/14, Zosyn  1/16-1/22, Bactrim  1/23>>  Interim History / Subjective:  07/25/24 Admit 1/15 steroids added, HHFNC 75%-80%, 50L 1/19 HF 45>60% 1/24-on high flow nasal cannula, 70%, 55 L  Denies any overnight events Breathing feels about the same Winded with minimal activity  Objective    Blood pressure (!) 131/41, pulse 68, temperature (!) 97.4 F (36.3 C), temperature source Oral, resp. rate 16, height 6' (1.829 m), weight  63.8 kg, SpO2 96%.    FiO2 (%):  [70 %-80 %] 70 %   Intake/Output Summary (Last 24 hours) at 08/05/2024 0833 Last data filed at 08/05/2024 0600 Gross per 24 hour  Intake 700.83 ml  Output 2450 ml  Net -1749.17 ml   Filed Weights   07/27/24 1822  Weight: 63.8 kg   Examination: Physical Exam Constitutional:      Appearance: Normal appearance.  HENT:     Head: Normocephalic and atraumatic.     Mouth/Throat:      Mouth: Mucous membranes are moist.  Eyes:     General: No scleral icterus.    Pupils: Pupils are equal, round, and reactive to light.  Cardiovascular:     Rate and Rhythm: Normal rate and regular rhythm.     Heart sounds: No murmur heard.    No friction rub.  Pulmonary:     Effort: No respiratory distress.     Breath sounds: No stridor. No wheezing or rhonchi.     Comments: Decreased AE bilaterally Musculoskeletal:     Cervical back: No rigidity or tenderness.  Skin:    General: Skin is warm.  Neurological:     General: No focal deficit present.     Mental Status: He is alert.  Psychiatric:        Mood and Affect: Mood normal.    CTA angio 01/14 1. Negative for pulmonary embolus. 2. Multilobar pneumonia. 3. 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 Evaluation for recurrent disease is challenging due to surrounding acute ground-glass and consolidation. 4. Interval enlargement of mediastinal lymph nodes may be reactive in etiology. Recommend attention on follow-up. 5. Partially loculated moderate left pleural effusion. 6. Aortic atherosclerosis (ICD10-I70.0). Coronary artery calcification. 7. Enlarged pulmonic trunk, indicative of pulmonary arterial hypertension.  Labs Hep B surface antigen-nonreactive  Resolved problem list   Assessment and Plan   Ongoing acute hypoxic respiratory failure Metastatic small cell lung cancer Durvalumab  induced pneumonitis Concern for lymphangitic spread of cancer  Durvalumab  induced pneumonitis He had radiation previously, durvalumab  pneumonitis incidence increases with previous radiation - He is currently on prednisone  60 mg with Bactrim  prophylaxis Current dosing schedule-prednisone  taper (60mg  x 1 week, 50 mg x1w, 40mg x1w, 30x1w, 20mg x1w, 10mg  x1w) with bactrim  prophylaxis (1DS tablet Monday, Wednesday and Friday)  With persistent hypoxic respiratory failure despite high-dose steroids, received infliximab   08/04/2024 - Hepatitis B surface antigen negative, QuantiFERON pending  Continue pulmonary hygiene PT OT Ongoing goals of care discussions  Patient is currently DNR/DNI  Risk of progressive decompensation remains high, continues to require very close monitoring  Jennet Epley, MD [place] [facility] Pager: See Amion           "

## 2024-08-05 NOTE — Progress Notes (Addendum)
 " PROGRESS NOTE    John [name]  FMW:[id number] DOB: 1945/09/09 DOA: 07/25/2024 PCP: Practice, [facility]   Brief Narrative: 79 y.o. 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 Sherrod.  Underwent radiation in the past.  Received last dose of immunotherapy with durvalumab  12/16. 12/30, he had a chest x-ray done which resulted 10 days later on 1/10 to show bilateral pneumonia.  Patient continued to have symptoms at that time but then suddenly started having shortness of breath, cough and decreased exercise tolerance.SABRA  He does not have any supplemental oxygen  at home. 1/11, he called Dr. Jeannett office and was called in a prescription for doxycycline which he started 1/13.  1/14, patient felt significantly short of breath and hence presented to the ED.  He was tachypneic tachycardic hypoxic at 68% on room air requiring 15 L of oxygen  on admission.  Respiratory virus panel was negative.  CT angio chest was negative for pulm embolism but  -showed patchy consolidation and ground glass bilaterally suggestive of multifocal pneumonia. -partially loculated moderate left pleural effusion. -Enlarged pulmonic trunk, indicative of pulmonary arterial hypertension. Subsequently his oxygen  requirement worsened to 60 L, patient received infliximab  08/04/2024 currently on 55 L he reports he feels a little better than yesterday. Patient remains in stepdown unit closely followed by pulmonary.     Assessment & Plan:   Principal Problem:   Multifocal pneumonia Active Problems:   History of lung cancer   Shortness of breath   Sepsis (HCC)   Palliative care by specialist   General weakness  Acute respiratory failure  with hypoxia ?  Pneumonitis from recent chemotherapy vs pneumonia in the setting of small cell lung cancer.   CT of the chest did not reveal any pulmonary embolism but showed multifocal infiltrates consistent with consolidation.  Patient admitted with gradually worsening shortness of breath.  Saturation was mid 60s on room air in the ER.  Initially was given Rocephin  and azithromycin  then changed to Zosyn .  Prior hospitalist discussed with Dr. Sherrod recommended prednisone  for 7 days.  Oxygen  requirement has been going up and down.  He received infliximab  on 08/04/2024 for pneumonitis secondary to chemotherapy.  He was asked initially given Solu-Medrol  changed to prednisone  tapering dose. Chest x-ray from 21st showed multifocal bilateral airspace disease with right apical sparing small left pleural effusion and mild elevation of the left hemidiaphragm.   Follow-up chest x-ray stable bilateral infiltrates small left pleural effusion he finished a course of Zosyn .   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 Sherrod.  Underwent radiation in the past.  Received last dose of immunotherapy with durvalumab  12/16. Oncology following   Hypertension H/o moderate aortic regurgitation CT angio showed enlarged pulmonary trunk indicating pulmonary artery hypertension. Last echo from 2018 had shown moderate AI.  Repeat echo report pending PTA meds- amlodipine 10 mg daily.  Currently on hold.  Blood pressure continue to be soft 111/46.   H/o prostate cancer s/p brachytherapy H/o bladder cancer   Estimated body mass index is 19.08 kg/m as calculated from the following:   Height as of this encounter: 6' (1.829 m).   Weight as  of this encounter: 63.8 kg.  DVT prophylaxis: Lovenox   code Status: DNR/DNI Family Communication: None at bedside  disposition Plan:  Status is: Inpatient Remains inpatient  appropriate because: Acute illness   Consultants: Pulmonologist and oncologist  Procedures: None  Antimicrobials: Zosyn   Subjective:  Received infliximab  yesterday Hbs ag negative Awake when I saw him this morning he feels better than yesterday still on heated high flow 55 L slight improvement from yesterday Objective: Vitals:   08/05/24 0400 08/05/24 0500 08/05/24 0600 08/05/24 0759  BP:  (!) 116/43 (!) 131/41   Pulse:   (!) 40 68  Resp: (!) 21 16 18 16   Temp:    (!) 97.4 F (36.3 C)  TempSrc:    Oral  SpO2: 96% 97% 97% 96%  Weight:      Height:        Intake/Output Summary (Last 24 hours) at 08/05/2024 1045 Last data filed at 08/05/2024 0600 Gross per 24 hour  Intake 700.83 ml  Output 2450 ml  Net -1749.17 ml   Filed Weights   07/27/24 1822  Weight: 63.8 kg    Examination:  General exam: Appears in mild distress Respiratory system:  Respiratory effort normal.  Cardiovascular system: irregular Gastrointestinal system: Abdomen is nondistended, soft and nontender. No organomegaly or masses felt. Normal bowel sounds heard. Central nervous system: Alert and oriented. No focal neurological deficits. Extremities: no edema.   Data Reviewed: I have personally reviewed following labs and imaging studies  CBC: Recent Labs  Lab 07/30/24 0619 08/02/24 0351 08/03/24 0452  WBC 17.3* 15.7* 18.7*  NEUTROABS 15.0*  --   --   HGB 11.6* 12.0* 11.4*  HCT 35.5* 38.6* 35.7*  MCV 95.9 98.2 97.5  PLT 189 198 199   Basic Metabolic Panel: Recent Labs  Lab 07/30/24 0619 08/02/24 0351 08/03/24 0452  NA 139 142 142  K 4.3 4.1 4.1  CL 102 104 105  CO2 32 30 32  GLUCOSE 90 90 155*  BUN 19 19 19   CREATININE 0.90 0.95 0.78  CALCIUM 9.8 9.6 9.4  MG 2.4  --   --    GFR: Estimated Creatinine Clearance: 67.6 mL/min (by C-G formula based on SCr of 0.78 mg/dL). Liver Function Tests: Recent Labs  Lab 08/02/24 0351  AST 31  ALT 44  ALKPHOS 105  BILITOT 0.7  PROT 6.0*   ALBUMIN 3.0*   No results for input(s): LIPASE, AMYLASE in the last 168 hours. No results for input(s): AMMONIA in the last 168 hours. Coagulation Profile: No results for input(s): INR, PROTIME in the last 168 hours.  Cardiac Enzymes: No results for input(s): CKTOTAL, CKMB, CKMBINDEX, TROPONINI in the last 168 hours. BNP (last 3 results) Recent Labs    05/12/24 1726 05/29/24 1826 07/25/24 1314  PROBNP 113.0 113.0 380.0*   HbA1C: No results for input(s): HGBA1C in the last 72 hours. CBG: No results for input(s): GLUCAP in the last 168 hours. Lipid Profile: No results for input(s): CHOL, HDL, LDLCALC, TRIG, CHOLHDL, LDLDIRECT in the last 72 hours. Thyroid  Function Tests: No results for input(s): TSH, T4TOTAL, FREET4, T3FREE, THYROIDAB in the last 72 hours. Anemia Panel: No results for input(s): VITAMINB12, FOLATE, FERRITIN, TIBC, IRON, RETICCTPCT in the last 72 hours. Sepsis Labs: No results for input(s): PROCALCITON, LATICACIDVEN in the last 168 hours.   Recent Results (from the past 240 hours)  MRSA Next Gen by PCR, Nasal     Status: None   Collection Time: 07/26/24  2:43 PM  Specimen: Nasal Mucosa; Nasal Swab  Result Value Ref Range Status   MRSA by PCR Next Gen NOT DETECTED NOT DETECTED Final    Comment: (NOTE) The GeneXpert MRSA Assay (FDA approved for NASAL specimens only), is one component of a comprehensive MRSA colonization surveillance program. It is not intended to diagnose MRSA infection nor to guide or monitor treatment for MRSA infections. Test performance is not FDA approved in patients less than 75 years old. Performed at [facility], [address], [place], [place] [address]          Radiology Studies: DG CHEST PORT 1 VIEW Result Date: 08/03/2024 CLINICAL DATA:  Shortness of breath EXAM: PORTABLE CHEST 1 VIEW COMPARISON:  Two days ago FINDINGS: The heart size and  mediastinal contours are within normal limits. Right internal jugular Port-A-Cath is unchanged. Stable bilateral lung opacities are noted concerning for multifocal pneumonia. Elevated left hemidiaphragm is noted. Minimal left pleural effusion is noted. The visualized skeletal structures are unremarkable. IMPRESSION: Stable bilateral lung opacities are noted concerning for multifocal pneumonia. Minimal left pleural effusion is noted. Electronically Signed   By: Lynwood Landy Raddle M.D.   On: 08/03/2024 16:04      Scheduled Meds:  Chlorhexidine  Gluconate Cloth  6 each Topical Daily   enoxaparin  (LOVENOX ) injection  40 mg Subcutaneous Q24H   feeding supplement  237 mL Oral BID BM   guaiFENesin   600 mg Oral BID   pneumococcal 20-valent conjugate vaccine  0.5 mL Intramuscular Tomorrow-1000   [START ON 08/09/2024] predniSONE   50 mg Oral Q breakfast   Followed by   NOREEN ON 08/16/2024] predniSONE   40 mg Oral Q breakfast   Followed by   NOREEN ON 08/23/2024] predniSONE   30 mg Oral Q breakfast   Followed by   NOREEN ON 08/30/2024] predniSONE   20 mg Oral Q breakfast   Followed by   NOREEN ON 09/06/2024] predniSONE   10 mg Oral Q breakfast   predniSONE   60 mg Oral Q breakfast   senna  1 tablet Oral BID   sodium chloride  flush  10-40 mL Intracatheter Q12H   sulfamethoxazole -trimethoprim   1 tablet Oral Q M,W,F   Continuous Infusions:     LOS: 11 days     Almarie KANDICE Hoots, MD 08/05/2024, 10:45 AM   "

## 2024-08-05 NOTE — Plan of Care (Signed)
   Problem: Skin Integrity: Goal: Risk for impaired skin integrity will decrease Outcome: Progressing

## 2024-08-06 DIAGNOSIS — J9601 Acute respiratory failure with hypoxia: Secondary | ICD-10-CM | POA: Diagnosis not present

## 2024-08-06 DIAGNOSIS — J188 Other pneumonia, unspecified organism: Secondary | ICD-10-CM | POA: Diagnosis not present

## 2024-08-06 DIAGNOSIS — J704 Drug-induced interstitial lung disorders, unspecified: Secondary | ICD-10-CM | POA: Diagnosis not present

## 2024-08-06 DIAGNOSIS — T451X5A Adverse effect of antineoplastic and immunosuppressive drugs, initial encounter: Secondary | ICD-10-CM | POA: Diagnosis not present

## 2024-08-06 DIAGNOSIS — C7800 Secondary malignant neoplasm of unspecified lung: Secondary | ICD-10-CM | POA: Diagnosis not present

## 2024-08-06 LAB — COMPREHENSIVE METABOLIC PANEL WITH GFR
ALT: 43 U/L (ref 0–44)
AST: 25 U/L (ref 15–41)
Albumin: 3.1 g/dL — ABNORMAL LOW (ref 3.5–5.0)
Alkaline Phosphatase: 90 U/L (ref 38–126)
Anion gap: 6 (ref 5–15)
BUN: 18 mg/dL (ref 8–23)
CO2: 32 mmol/L (ref 22–32)
Calcium: 9.8 mg/dL (ref 8.9–10.3)
Chloride: 103 mmol/L (ref 98–111)
Creatinine, Ser: 0.76 mg/dL (ref 0.61–1.24)
GFR, Estimated: >60 mL/min
Glucose, Bld: 91 mg/dL (ref 70–99)
Potassium: 4.4 mmol/L (ref 3.5–5.1)
Sodium: 140 mmol/L (ref 135–145)
Total Bilirubin: 0.3 mg/dL (ref 0.0–1.2)
Total Protein: 6.1 g/dL — ABNORMAL LOW (ref 6.5–8.1)

## 2024-08-06 LAB — CBC
HCT: 39.6 % (ref 39.0–52.0)
Hemoglobin: 12.2 g/dL — ABNORMAL LOW (ref 13.0–17.0)
MCH: 30.4 pg (ref 26.0–34.0)
MCHC: 30.8 g/dL (ref 30.0–36.0)
MCV: 98.8 fL (ref 80.0–100.0)
Platelets: 213 10*3/uL (ref 150–400)
RBC: 4.01 MIL/uL — ABNORMAL LOW (ref 4.22–5.81)
RDW: 13 % (ref 11.5–15.5)
WBC: 16.9 10*3/uL — ABNORMAL HIGH (ref 4.0–10.5)
nRBC: 0 % (ref 0.0–0.2)

## 2024-08-06 NOTE — Progress Notes (Signed)
 "                                                                                                                                                                                                          Daily Progress Note   Patient Name: John [name]       Date: 08/06/2024 DOB: 08-20-1945  Age: 79 y.o. MRN#: [id number] Attending Physician: Will Almarie MATSU, MD Primary Care Physician: [facility], [facility] Admit Date: 07/25/2024  Reason for Consultation/Follow-up: Establishing goals of care, Non pain symptom management, and Pain control  Subjective: Patient is sitting up in a chair, is hoping that his niece will be able to come in and visit, remains on HHF Okay, discussed with bedside RN, slight exertion/participation with PT increases his O2 requirements. Doesn't feel like he needs opioids, knows they are available for dyspnea/pain.    Length of Stay: 12  Current Medications: Scheduled Meds:   Chlorhexidine  Gluconate Cloth  6 each Topical Daily   enoxaparin  (LOVENOX ) injection  40 mg Subcutaneous Q24H   feeding supplement  237 mL Oral BID BM   guaiFENesin   600 mg Oral BID   pneumococcal 20-valent conjugate vaccine  0.5 mL Intramuscular Tomorrow-1000   [START ON 08/09/2024] predniSONE   50 mg Oral Q breakfast   Followed by   NOREEN ON 08/16/2024] predniSONE   40 mg Oral Q breakfast   Followed by   NOREEN ON 08/23/2024] predniSONE   30 mg Oral Q breakfast   Followed by   NOREEN ON 08/30/2024] predniSONE   20 mg Oral Q breakfast   Followed by   NOREEN ON 09/06/2024] predniSONE   10 mg Oral Q breakfast   predniSONE   60 mg Oral Q breakfast   senna  1 tablet Oral BID   sodium chloride  flush  10-40 mL Intracatheter Q12H   sulfamethoxazole -trimethoprim   1 tablet Oral Q M,W,F    Continuous Infusions:   PRN Meds: acetaminophen  **OR** acetaminophen , ALPRAZolam , bisacodyl , guaiFENesin -dextromethorphan, hydrALAZINE , ipratropium-albuterol , melatonin, mouth rinse, oxyCODONE , polyethylene  glycol, sodium chloride  flush, traZODone   Physical Exam         Awake alert Resting in chair No edema Regular work of breathing - but appears tachypneic Monitor noted  Vital Signs: BP (!) 118/53 (BP Location: Left Arm)   Pulse 88   Temp 97.7 F (36.5 C) (Oral)   Resp 18   Ht 6' (1.829 m)   Wt 63.8 kg   SpO2 90%   BMI 19.08 kg/m  SpO2: SpO2: 90 % O2 Device: O2 Device: Heated High Flow Nasal Cannula O2 Flow  Rate: O2 Flow Rate (L/min): 50 L/min  Intake/output summary:  Intake/Output Summary (Last 24 hours) at 08/06/2024 1428 Last data filed at 08/06/2024 1200 Gross per 24 hour  Intake 20 ml  Output 2600 ml  Net -2580 ml   LBM: Last BM Date : 08/05/24 Baseline Weight: Weight: 63.8 kg Most recent weight: Weight: 63.8 kg       Palliative Assessment/Data:      Patient Active Problem List   Diagnosis Date Noted   Palliative care by specialist 08/03/2024   General weakness 08/03/2024   History of lung cancer 07/26/2024   Shortness of breath 07/26/2024   Sepsis (HCC) 07/26/2024   Multifocal pneumonia 07/25/2024   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 Acute hypoxic respiratory failure in the context of advanced small cell lung cancer The patient was not on supplemental oxygen  prior to this admission and now demonstrates severe hypoxemia requiring heated high-flow nasal  cannula support. Chest imaging reveals diffuse bilateral airspace disease, with differential considerations including infectious pneumonia versus immune-related pneumonitis; systemic steroids are currently being administered. Respiratory status, oxygen  needs, and overall illness trajectory will be followed closely. Given the advanced oncologic disease, there is concern for reduced pulmonary reserve and limited potential for full recovery. Pulmonology and Oncology remain actively involved.  2. Extensive-stage small cell lung cancer, status post radiation and recent immunotherapy The current hospitalization is suspected to reflect disease progression and/or treatment-related complications. Prognosis and treatment options have been reviewed with the patient and his niece. They wish to pursue a time-limited continuation of current therapies while engaging in further discussion with the primary oncologist before making additional decisions regarding goals of care. Palliative Medicine will remain involved for ongoing support.  3. Goals of care / advance care planning Palliative Care is following to assist with ongoing goals-of-care conversations and supportive counseling. The patient remains DNR/DNI, consistent with his wishes to avoid intubation and resuscitative efforts. Symptom burden and emotional concerns were addressed. At this time, the patient prefers to continue the current plan of care and revisit broader goals after additional discussions with Oncology.  4. Symptom management and psychosocial support Dyspnea is being managed with high-flow oxygen  therapy and corticosteroids. The patient was educated on the role of opioids for relief of dyspnea and pain, including the use of oral oxycodone  immediate-release as needed, and expressed understanding. Symptoms will be reassessed regularly, with escalation of comfort-directed measures as clinically indicated.  Plan:  Maintain DNR/DNI status  Continue  current medical management per Primary, Pulmonology, and Oncology teams  Proceed with a time-limited trial of ongoing interventions - so far - has not been able to be weaned down from the [facility].   Palliative Care to continue following for symptom control, psychosocial support, and reassessment of prognosis and goals of care in collaboration with Oncology Code Status:    Code Status Orders  (From admission,  onward)           Start     Ordered   07/27/24 1242  Do not attempt resuscitation (DNR)- Limited -Do Not Intubate (DNI)  (Code Status)  Continuous       Question Answer Comment  If pulseless and not breathing No CPR or chest compressions.   In Pre-Arrest Conditions (Patient Is Breathing and Has A Pulse) Do not intubate. Provide all appropriate non-invasive medical interventions. Avoid ICU transfer unless indicated or required.   Consent: Discussion documented in EHR or advanced directives reviewed      07/27/24 1241           Code Status History     Date Active Date Inactive Code Status Order ID Comments User Context   07/25/2024 1503 07/27/2024 1241 Full Code [id number]  Arlice Reichert, MD ED   09/19/2023 1558 09/20/2023 0514 Full Code [id number]  Johann Sieving, MD HOV       Prognosis:  Guarded.   Discharge Planning: To Be Determined  Care plan was discussed with patient, RN and [facility] MD.   Thank you for allowing the Palliative Medicine Team to assist in the care of this patient. I personally spent a total of 35 minutes in the care of the patient today including preparing to see the patient, getting/reviewing separately obtained history, performing a medically appropriate exam/evaluation, counseling and educating, referring and communicating with other health care professionals, documenting clinical information in the EHR, and communicating results.      Greater than 50%  of this time was spent counseling and coordinating care related to the above assessment and  plan.  Lonia Serve, MD  Please contact Palliative Medicine Team phone at [phone] for questions and concerns.       "

## 2024-08-06 NOTE — Progress Notes (Signed)
 Physical Therapy Treatment Patient Details Name: John [name] MRN: [id number] DOB: Aug 09, 1945 Today's Date: 08/06/2024   History of Present Illness Mr. Chaires is a 79 yr old male admitted to the hospital with shortness of breath on 07/25/24. He was found to have acute respiratory failure with hypoxia and multi-focal PNA requiring heated high flow O2. PMH: current small cell lung cancer s/p brachytherapy, bladder CA, HTN, aortic regurgitation, arthritis, psoriasis    PT Comments  The patient received resting in bed. Patient able to mobilize to sitting with HOB raised, no extra assistance. Patient  stands and pivots to recliner  with RW. Patient with SPO2 87% from  94% at rest. , RR 35. Patient  does not tolerate much more than transfers to  recliner, unable to progress ambulation. Patient maintained  on   50 L/80% HHFNC.  Patient will benefit from continued inpatient follow up therapy, <3 hours/day    If plan is discharge home, recommend the following: A lot of help with walking and/or transfers;A little help with bathing/dressing/bathroom;Assistance with cooking/housework;Assist for transportation;Help with stairs or ramp for entrance   Can travel by private vehicle     No  Equipment Recommendations  None recommended by PT    Recommendations for Other Services       Precautions / Restrictions Precautions Precautions: Fall Recall of Precautions/Restrictions: Intact Precaution/Restrictions Comments: watch O2 on HHFNC Restrictions Weight Bearing Restrictions Per Provider Order: No     Mobility  Bed Mobility   Bed Mobility: Supine to Sit     Supine to sit: Contact guard, Used rails     General bed mobility comments: extra time, rest breaks intermittent to catch breath    Transfers Overall transfer level: Needs assistance Equipment used: Rolling walker (2 wheels) Transfers: Sit to/from Stand, Bed to chair/wheelchair/BSC Sit to Stand: Min assist, From elevated surface   Step  pivot transfers: Min assist       General transfer comment: patient very limited in tolerance to  activity to stand and pivot to recliner, requires time to recover. RR up  to 35. SPO2 drop to 87%.    Ambulation/Gait                   Stairs             Wheelchair Mobility     Tilt Bed    Modified Rankin (Stroke Patients Only)       Balance   Sitting-balance support: No upper extremity supported, Feet supported Sitting balance-Leahy Scale: Good Sitting balance - Comments: static sitting-good. dynamic sitting-fair+   Standing balance support: Bilateral upper extremity supported, Reliant on assistive device for balance, During functional activity Standing balance-Leahy Scale: Poor Standing balance comment: Needs RW and CGA                            Communication Communication Communication: No apparent difficulties  Cognition Arousal: Alert Behavior During Therapy: WFL for tasks assessed/performed   PT - Cognitive impairments: No apparent impairments                       PT - Cognition Comments: Eager to mobilize Following commands: Intact      Cueing Cueing Techniques: Verbal cues  Exercises      General Comments        Pertinent Vitals/Pain Pain Assessment Faces Pain Scale: Hurts little more Pain Location: back Pain Descriptors / Indicators: Aching  Pain Intervention(s): Repositioned    Home Living                          Prior Function            PT Goals (current goals can now be found in the care plan section) Progress towards PT goals: Not progressing toward goals - comment (respiratory compromised with any activity,)    Frequency    Min 2X/week      PT Plan      Co-evaluation              AM-PAC PT 6 Clicks Mobility   Outcome Measure  Help needed turning from your back to your side while in a flat bed without using bedrails?: None Help needed moving from lying on your back  to sitting on the side of a flat bed without using bedrails?: A Little Help needed moving to and from a bed to a chair (including a wheelchair)?: A Little Help needed standing up from a chair using your arms (e.g., wheelchair or bedside chair)?: A Little Help needed to walk in hospital room?: Total Help needed climbing 3-5 steps with a railing? : Total 6 Click Score: 15    End of Session Equipment Utilized During Treatment: Gait belt Activity Tolerance: Treatment limited secondary to medical complications (Comment) Patient left: in chair;with call bell/phone within reach;with chair alarm set Nurse Communication: Mobility status PT Visit Diagnosis: Difficulty in walking, not elsewhere classified (R26.2)     Time: 8877-8856 PT Time Calculation (min) (ACUTE ONLY): 21 min  Charges:    $Therapeutic Activity: 8-22 mins PT General Charges $$ ACUTE PT VISIT: 1 Visit                    Darice Potters PT Acute Rehabilitation Services Office [phone]   Potters Darice Norris 08/06/2024, 12:48 PM

## 2024-08-06 NOTE — Progress Notes (Signed)
 "  NAME:  John [name], MRN:  [id number], DOB:  03/31/1946, LOS: 12 ADMISSION DATE:  07/25/2024,  History of Present Illness:  58 yoM with PMH significant for former smoker (quit 20-69yrs ago), metastatic small cell lung cancer., prostate cancer 2013 and urothelial carcinoma 2022, left kidney lesion s/p SBRT 04/2024, HTN, aortic regurgitation, type 1 second degree AV block, cataracts    Pt is followed by Dr. Sherrod with oncology for metastatic small cell lung cancer.  Initially dx 07/2023, s/p palliative RT completed 09/19/23, and palliative chemo with carboplatin  + etoposide  + durvalumab , currently maintained on durvalumab , last dose 12/17.  On CXR 12/30 developed bilateral lobe patchy infiltrates.  Started having some exertional SOB.  Was called in rx doxycycline that he started 1/13 and then presented to ER 1/14 due to worsening SOB.  Denies cough but reports several days ago had speck of blood but nothing since.   Denies fever, chills, cough, chest pain, sick contacts, orthopnea, N/V, dysphagia or choking episodes, or LE swelling/ pain.  Lives alone, uses a cane, and reports is otherwise independent in ADLs except for doesn't drive anymore.  Is not on home oxygen  or uses any inhalers/ nebs at baseline.    Found to be hypoxic on admit at 68% on RA, afebrile, WBC 15, PCT 0.11, SARs, flu, RSV neg, MRSA PCR neg.  CTA PE neg for PE but showing centrilobular and paraseptal emphysema with patchy consolidation and GGO bilaterally, moderate partially loculated left pleural effusion, and s/p radiation changes in upper and lower lobes bilaterally, interval enlargement of mediastinal lymph nodes, and enlarged pulmonic trunk indicative of pulmonary arterial hypertension. Admitted to [facility].  S/p empiric azithro and ctx on 1/14.  Oncology following, changed to [facility] and started on steroids started 1/15.  Pulmonary consulted 1/16 given CT findings and ongoing HHFNC of 60% and 50L.  Pt reports feeling better since admit.   Currently no SOB at rest and continues to deny cough.    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   worked up with cardiology dr hochrein and f/u prn   Nocturia 02/11/2021   Prostate CA (HCC) 2024   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   Wears glasses 02/11/2021   Significant Hospital Events: Including procedures, antibiotic start and stop dates in addition to other pertinent events   1/14 Admit 1/15 steroids added, HHFNC 75%-80%, 50L 1/19 HF 45>60% 1/24 on high flow nasal cannula, 70%, 55 L 1/26 remains on heated high flow 80% FiO2 50 L oxygen  saturations 96 and above  Antibiotics  Azithromycin  1/14 Zosyn  1/16-1/22 Bactrim  1/23 -  Interim History / Subjective:  Seen sitting up in bed with no acute complaints asking when breakfast will arrive   Objective    Blood pressure ROLLEN)  118/53, pulse 68, temperature 97.7 F (36.5 C), temperature source Oral, resp. rate (!) 31, height 6' (1.829 m), weight 63.8 kg, SpO2 98%.    FiO2 (%):  [70 %-80 %] 80 %   Intake/Output Summary (Last 24 hours) at 08/06/2024 0943 Last data filed at 08/06/2024 9480 Gross per 24 hour  Intake 10 ml  Output 2050 ml  Net -2040 ml   Filed Weights   07/27/24 1822  Weight: 63.8 kg   Examination: General: Acute on chronic ill-appearing elderly male lying in bed on  heated high flow nasal cannula in no acute distress HEENT: [place]/AT, HHFNC, MM pink/moist, PERRL,  Neuro: Alert and oriented x3,non-focal  CV: s1s2 regular rate and rhythm, no murmur, rubs, or gallops,  PULM: Slightly diminished bilaterally no increased work of breathing, no added breath sounds, HHFNC GI: soft, bowel sounds active in all 4 quadrants, non-tender, non-distended, tolerating oral deit Extremities: warm/dry, no edema  Skin: no rashes or lesions  CTA angio 01/14 1. Negative for pulmonary embolus. 2. Multilobar pneumonia. 3. 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 Evaluation for recurrent disease is challenging due to surrounding acute ground-glass and consolidation. 4. Interval enlargement of mediastinal lymph nodes may be reactive in etiology. Recommend attention on follow-up. 5. Partially loculated moderate left pleural effusion. 6. Aortic atherosclerosis (ICD10-I70.0). Coronary artery calcification. 7. Enlarged pulmonic trunk, indicative of pulmonary arterial hypertension.  Resolved problem list   Assessment and Plan   Ongoing acute hypoxic respiratory failure Metastatic small cell lung cancer Durvalumab  induced pneumonitis -He had radiation previously, durvalumab  pneumonitis incidence increases with previous radiation - Hepatitis B surface antigen negative, QuantiFERON pending Concern for lymphangitic spread of cancer P: Continue supplemental oxygen , wean as able SpO2 greater than 90 Prednisone  taper in place Prophylactic Bactrim  for PJP prophylaxis Follow QuantiFERON  Aspiration precautions Mobilize as able  As needed benzodiazepines  Signature:  Wylodean Shimmel D. Harris, NP-C [facility] Personal contact information can be found on Amion  If no contact or response made please call [phone] 08/06/2024, 9:52 AM             "

## 2024-08-06 NOTE — Progress Notes (Signed)
 " PROGRESS NOTE    John [name]  FMW:[id number] DOB: [birth year] DOA: 07/25/2024 PCP: Practice, [facility]   Brief Narrative: 79 y.o. 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 Sherrod.  Underwent radiation in the past.  Received last dose of immunotherapy with durvalumab  12/16. 12/30, he had a chest x-ray done which resulted 10 days later on 1/10 to show bilateral pneumonia.  Patient continued to have symptoms at that time but then suddenly started having shortness of breath, cough and decreased exercise tolerance.SABRA  He does not have any supplemental oxygen  at home. 1/11, he called Dr. Jeannett office and was called in a prescription for doxycycline which he started 1/13.  1/14, patient felt significantly short of breath and hence presented to the ED.  He was tachypneic tachycardic hypoxic at 68% on room air requiring 15 L of oxygen  on admission.  Respiratory virus panel was negative.  CT angio chest was negative for pulm embolism but  -showed patchy consolidation and ground glass bilaterally suggestive of multifocal pneumonia. -partially loculated moderate left pleural effusion. -Enlarged pulmonic trunk, indicative of pulmonary arterial hypertension. Subsequently his oxygen  requirement worsened to 60 L, patient received infliximab  08/04/2024 currently on 55 L he reports he feels a little better than yesterday. Patient remains in stepdown unit closely followed by pulmonary.     Assessment & Plan:   Principal Problem:   Multifocal pneumonia Active Problems:   History of lung cancer   Shortness of breath   Sepsis (HCC)   Palliative care by specialist   General weakness  Acute respiratory failure  with hypoxia ?  Pneumonitis from recent chemotherapy vs pneumonia in the setting of small cell lung cancer. CT of the chest did not reveal any pulmonary embolism but showed multifocal infiltrates consistent with consolidation.  Patient admitted with gradually worsening shortness of breath.  Saturation was mid 60s on room air in the ER.  Initially was given Rocephin  and azithromycin  then changed to Zosyn .  Prior hospitalist discussed with Dr. Sherrod recommended prednisone  for 7 days.  Oxygen  requirement has been going up and down.  Currently on 50 L heated high flow nasal cannula, 80% FiO2 satting 98%.   He received infliximab  on 08/04/2024 for pneumonitis secondary to chemotherapy.He was asked initially given Solu-Medrol  changed to prednisone  tapering dose. Chest x-ray from 21st showed multifocal bilateral airspace disease with right apical sparing small left pleural effusion and mild elevation of the left hemidiaphragm.   Follow-up chest x-ray stable bilateral infiltrates small left pleural effusion he finished a course of Zosyn .   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 Sherrod.  Underwent radiation in the past.  Received last dose of immunotherapy with durvalumab  12/16. Oncology following   Hypertension H/o moderate aortic regurgitation CT angio showed enlarged pulmonary trunk indicating pulmonary artery hypertension. Last echo from 2018 had shown moderate AI.  Repeat echo report pending PTA meds- amlodipine 10 mg daily.  Currently on hold.  Blood pressure continue to be soft 111/46.   H/o prostate cancer s/p brachytherapy H/o bladder cancer   Estimated body mass index is 19.08 kg/m as calculated from the following:   Height  as of this encounter: 6' (1.829 m).   Weight as of this encounter: 63.8 kg.  DVT prophylaxis: Lovenox   code Status: DNR/DNI Family Communication: None at bedside   disposition Plan:  Status is: Inpatient Remains inpatient appropriate because: Acute illness   Consultants: Pulmonologist and oncologist  Procedures: None  Antimicrobials: Zosyn   Subjective:  Received infliximab  1/24 Hbs ag negative On 50 L 80% FiO2 saturating 98% overnight events reported by the staff  Objective: Vitals:   08/06/24 0400 08/06/24 0700 08/06/24 0800 08/06/24 0849  BP: (!) 144/44 (!) 118/41 (!) 118/53   Pulse: 66 64 66 68  Resp: (!) 25 18 16  (!) 31  Temp: 97.7 F (36.5 C)  97.7 F (36.5 C)   TempSrc: Oral  Oral   SpO2: 96% 99% 97% 98%  Weight:      Height:        Intake/Output Summary (Last 24 hours) at 08/06/2024 1029 Last data filed at 08/06/2024 9480 Gross per 24 hour  Intake 10 ml  Output 2050 ml  Net -2040 ml   Filed Weights   07/27/24 1822  Weight: 63.8 kg    Examination:  General exam: Appears in mild distress Respiratory system:  Respiratory effort normal.  Cardiovascular system: irregular Gastrointestinal system: Abdomen is nondistended, soft and nontender. No organomegaly or masses felt. Normal bowel sounds heard. Central nervous system: Alert and oriented. No focal neurological deficits. Extremities: no edema.   Data Reviewed: I have personally reviewed following labs and imaging studies  CBC: Recent Labs  Lab 08/02/24 0351 08/03/24 0452 08/06/24 0412  WBC 15.7* 18.7* 16.9*  HGB 12.0* 11.4* 12.2*  HCT 38.6* 35.7* 39.6  MCV 98.2 97.5 98.8  PLT 198 199 213   Basic Metabolic Panel: Recent Labs  Lab 08/02/24 0351 08/03/24 0452 08/06/24 0412  NA 142 142 140  K 4.1 4.1 4.4  CL 104 105 103  CO2 30 32 32  GLUCOSE 90 155* 91  BUN 19 19 18   CREATININE 0.95 0.78 0.76  CALCIUM 9.6 9.4 9.8   GFR: Estimated Creatinine Clearance: 67.6 mL/min (by C-G formula based on SCr of 0.76 mg/dL). Liver Function Tests: Recent Labs  Lab 08/02/24 0351 08/06/24 0412  AST 31 25  ALT 44 43  ALKPHOS 105 90  BILITOT 0.7 0.3  PROT 6.0*  6.1*  ALBUMIN 3.0* 3.1*   No results for input(s): LIPASE, AMYLASE in the last 168 hours. No results for input(s): AMMONIA in the last 168 hours. Coagulation Profile: No results for input(s): INR, PROTIME in the last 168 hours.  Cardiac Enzymes: No results for input(s): CKTOTAL, CKMB, CKMBINDEX, TROPONINI in the last 168 hours. BNP (last 3 results) Recent Labs    05/12/24 1726 05/29/24 1826 07/25/24 1314  PROBNP 113.0 113.0 380.0*   HbA1C: No results for input(s): HGBA1C in the last 72 hours. CBG: No results for input(s): GLUCAP in the last 168 hours. Lipid Profile: No results for input(s): CHOL, HDL, LDLCALC, TRIG, CHOLHDL, LDLDIRECT in the last 72 hours. Thyroid  Function Tests: No results for input(s): TSH, T4TOTAL, FREET4, T3FREE, THYROIDAB in the last 72 hours. Anemia Panel: No results for input(s): VITAMINB12, FOLATE, FERRITIN, TIBC, IRON, RETICCTPCT in the last 72 hours. Sepsis Labs: No results for input(s): PROCALCITON, LATICACIDVEN in the last 168 hours.   No results found for this or any previous visit (from the past 240 hours).        Radiology Studies: No results found.     Scheduled Meds:  Chlorhexidine  Gluconate  Cloth  6 each Topical Daily   enoxaparin  (LOVENOX ) injection  40 mg Subcutaneous Q24H   feeding supplement  237 mL Oral BID BM   guaiFENesin   600 mg Oral BID   pneumococcal 20-valent conjugate vaccine  0.5 mL Intramuscular Tomorrow-1000   [START ON 08/09/2024] predniSONE   50 mg Oral Q breakfast   Followed by   NOREEN ON 08/16/2024] predniSONE   40 mg Oral Q breakfast   Followed by   NOREEN ON 08/23/2024] predniSONE   30 mg Oral Q breakfast   Followed by   NOREEN ON 08/30/2024] predniSONE   20 mg Oral Q breakfast   Followed by   NOREEN ON 09/06/2024] predniSONE   10 mg Oral Q breakfast   predniSONE   60 mg Oral Q breakfast   senna  1 tablet Oral BID   sodium chloride  flush  10-40 mL  Intracatheter Q12H   sulfamethoxazole -trimethoprim   1 tablet Oral Q M,W,F   Continuous Infusions:     LOS: 12 days     Almarie KANDICE Hoots, MD 08/06/2024, 10:29 AM   "

## 2024-08-06 NOTE — Plan of Care (Signed)
  Problem: Clinical Measurements: Goal: Will remain free from infection Outcome: Progressing   Problem: Clinical Measurements: Goal: Diagnostic test results will improve Outcome: Progressing   Problem: Clinical Measurements: Goal: Respiratory complications will improve Outcome: Progressing   Problem: Coping: Goal: Level of anxiety will decrease Outcome: Progressing

## 2024-08-07 ENCOUNTER — Other Ambulatory Visit: Payer: Self-pay

## 2024-08-07 ENCOUNTER — Inpatient Hospital Stay (HOSPITAL_COMMUNITY)

## 2024-08-07 DIAGNOSIS — J704 Drug-induced interstitial lung disorders, unspecified: Secondary | ICD-10-CM | POA: Diagnosis not present

## 2024-08-07 DIAGNOSIS — C7800 Secondary malignant neoplasm of unspecified lung: Secondary | ICD-10-CM | POA: Diagnosis not present

## 2024-08-07 DIAGNOSIS — J188 Other pneumonia, unspecified organism: Secondary | ICD-10-CM | POA: Diagnosis not present

## 2024-08-07 DIAGNOSIS — J9601 Acute respiratory failure with hypoxia: Secondary | ICD-10-CM | POA: Diagnosis not present

## 2024-08-07 DIAGNOSIS — T451X5A Adverse effect of antineoplastic and immunosuppressive drugs, initial encounter: Secondary | ICD-10-CM | POA: Diagnosis not present

## 2024-08-07 NOTE — TOC Progression Note (Signed)
 Transition of Care [facility]) - Progression Note    Patient Details  Name: John [name] MRN: [id number] Date of Birth: 1945-12-14  Transition of [facility] CM/SW Contact  Jon ONEIDA Anon, RN Phone Number: 08/07/2024, 8:53 AM  Clinical Narrative:    Pt remains on 50L O2 HHFNC. Pt not medically stable for discharge. Palliative medicine following. ICM will continue to follow for DC planning needs.                     Expected Discharge Plan and Services                                               Social Drivers of Health (SDOH) Interventions SDOH Screenings   Food Insecurity: No Food Insecurity (07/27/2024)  Housing: Low Risk (07/27/2024)  Transportation Needs: No Transportation Needs (07/27/2024)  Utilities: Not At Risk (07/27/2024)  Depression (PHQ2-9): Low Risk (06/27/2024)  Tobacco Use: Medium Risk (07/27/2024)    Readmission Risk Interventions    07/27/2024    3:13 PM  Readmission Risk Prevention Plan  Transportation Screening Complete  PCP or Specialist Appt within 5-7 Days Complete  Home Care Screening Complete  Medication Review (RN CM) Complete

## 2024-08-07 NOTE — Progress Notes (Signed)
 " PROGRESS NOTE    John [name]  FMW:[id number] DOB: 12/23/1945 DOA: 07/25/2024 PCP: Practice, [facility]   Brief Narrative: 79 y.o. 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 Sherrod.  Underwent radiation in the past.  Received last dose of immunotherapy with durvalumab  12/16. 12/30, he had a chest x-ray done which resulted 10 days later on 1/10 to show bilateral pneumonia.  Patient continued to have symptoms at that time but then suddenly started having shortness of breath, cough and decreased exercise tolerance.SABRA  He does not have any supplemental oxygen  at home. 1/11, he called Dr. Jeannett office and was called in a prescription for doxycycline which he started 1/13.  1/14, patient felt significantly short of breath and hence presented to the ED.  He was tachypneic tachycardic hypoxic at 68% on room air requiring 15 L of oxygen  on admission.  Respiratory virus panel was negative.  CT angio chest was negative for pulm embolism but  -showed patchy consolidation and ground glass bilaterally suggestive of multifocal pneumonia. -partially loculated moderate left pleural effusion. -Enlarged pulmonic trunk, indicative of pulmonary arterial hypertension. Subsequently his oxygen  requirement worsened to 60 L, patient received infliximab  08/04/2024 currently on 55 L he reports he feels a little better than yesterday. Patient remains in stepdown unit closely followed by pulmonary.     Assessment & Plan:   Principal Problem:   Multifocal pneumonia Active Problems:   History of lung cancer   Shortness of breath   Sepsis (HCC)   Palliative care by specialist   General weakness  Acute respiratory failure  with hypoxia ?  Pneumonitis from recent chemotherapy vs pneumonia in the setting of small cell lung cancer vs lymphangitic spread CT of the chest did not reveal any pulmonary embolism but showed multifocal infiltrates consistent with consolidation.  Patient admitted with gradually worsening shortness of breath.  Saturation was mid 60s on room air in the ER.  Initially was given Rocephin  and azithromycin  then changed to Zosyn .  Prior hospitalist discussed with Dr. Sherrod recommended prednisone . Oxygen  requirement still remains high at  50 L heated high flow nasal cannula, 80% FiO2 satting 98%.  On Bactrim  for PJP prophylaxis  He received infliximab  on 08/04/2024 for pneumonitis secondary to chemotherapy.He was initially given Solu-Medrol  changed to prednisone  tapering dose. Chest x-ray from 21st showed multifocal bilateral airspace disease with right apical sparing small left pleural effusion and mild elevation of the left hemidiaphragm.   Follow-up chest x-ray stable bilateral infiltrates small left pleural effusion 1/23 he finished a course of Zosyn . 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 Sherrod.  Underwent radiation in the past.  Received last dose of immunotherapy with durvalumab  12/16. Oncology following   Hypertension H/o moderate aortic regurgitation CT angio showed enlarged pulmonary trunk indicating pulmonary artery hypertension. Last echo from 2018 had shown moderate AI.  Repeat echo report pending PTA meds- amlodipine 10 mg daily.  Currently on hold.  Blood pressure continue to be soft 111/46.   H/o prostate cancer s/p brachytherapy H/o bladder cancer   Estimated body mass index is 19.08 kg/m as  calculated from the following:   Height as of this encounter: 6' (1.829 m).   Weight as of this encounter: 63.8 kg.  DVT  prophylaxis: Lovenox   code Status: DNR/DNI Family Communication: None at bedside  disposition Plan:  Status is: Inpatient Remains inpatient appropriate because: Acute illness   Consultants: Pulmonologist and oncologist palliative  Procedures: None  Antimicrobials:none Subjective:awake tired Feels about the same No N/V/D  Received infliximab  1/24 Hbs ag negative Still On 50 L 80% FiO2 saturating 98% overnight events reported by the staff  Objective: Vitals:   08/07/24 0900 08/07/24 1000 08/07/24 1100 08/07/24 1200  BP: (!) 122/48 (!) 131/50 (!) 133/48 (!) 122/54  Pulse: 69 89 91 96  Resp: 14 (!) 34 (!) 27 (!) 28  Temp:    97.8 F (36.6 C)  TempSrc:    Oral  SpO2: 96% 90% 97% 90%  Weight:      Height:        Intake/Output Summary (Last 24 hours) at 08/07/2024 1300 Last data filed at 08/07/2024 1200 Gross per 24 hour  Intake 10 ml  Output 1200 ml  Net -1190 ml   Filed Weights   07/27/24 1822  Weight: 63.8 kg    Examination:  General exam: Appears in mild distress Respiratory system:  Respiratory effort normal. rhonchi Cardiovascular system: irregular Gastrointestinal system: Abdomen is nondistended, soft and nontender. No organomegaly or masses felt. Normal bowel sounds heard. Central nervous system: Alert and oriented. No focal neurological deficits. Extremities: no edema.   Data Reviewed: I have personally reviewed following labs and imaging studies  CBC: Recent Labs  Lab 08/02/24 0351 08/03/24 0452 08/06/24 0412  WBC 15.7* 18.7* 16.9*  HGB 12.0* 11.4* 12.2*  HCT 38.6* 35.7* 39.6  MCV 98.2 97.5 98.8  PLT 198 199 213   Basic Metabolic Panel: Recent Labs  Lab 08/02/24 0351 08/03/24 0452 08/06/24 0412  NA 142 142 140  K 4.1 4.1 4.4  CL 104 105 103  CO2 30 32 32  GLUCOSE 90 155* 91  BUN 19 19 18   CREATININE 0.95 0.78 0.76  CALCIUM 9.6 9.4 9.8   GFR: Estimated Creatinine Clearance: 67.6 mL/min (by C-G formula based on SCr of 0.76  mg/dL). Liver Function Tests: Recent Labs  Lab 08/02/24 0351 08/06/24 0412  AST 31 25  ALT 44 43  ALKPHOS 105 90  BILITOT 0.7 0.3  PROT 6.0* 6.1*  ALBUMIN 3.0* 3.1*   No results for input(s): LIPASE, AMYLASE in the last 168 hours. No results for input(s): AMMONIA in the last 168 hours. Coagulation Profile: No results for input(s): INR, PROTIME in the last 168 hours.  Cardiac Enzymes: No results for input(s): CKTOTAL, CKMB, CKMBINDEX, TROPONINI in the last 168 hours. BNP (last 3 results) Recent Labs    05/12/24 1726 05/29/24 1826 07/25/24 1314  PROBNP 113.0 113.0 380.0*   HbA1C: No results for input(s): HGBA1C in the last 72 hours. CBG: No results for input(s): GLUCAP in the last 168 hours. Lipid Profile: No results for input(s): CHOL, HDL, LDLCALC, TRIG, CHOLHDL, LDLDIRECT in the last 72 hours. Thyroid  Function Tests: No results for input(s): TSH, T4TOTAL, FREET4, T3FREE, THYROIDAB in the last 72 hours. Anemia Panel: No results for input(s): VITAMINB12, FOLATE, FERRITIN, TIBC, IRON, RETICCTPCT in the last 72 hours. Sepsis Labs: No results for input(s): PROCALCITON, LATICACIDVEN in the last 168 hours.   No results found for this or any previous visit (from the past 240 hours).        Radiology  Studies: No results found.     Scheduled Meds:  Chlorhexidine  Gluconate Cloth  6 each Topical Daily   enoxaparin  (LOVENOX ) injection  40 mg Subcutaneous Q24H   feeding supplement  237 mL Oral BID BM   guaiFENesin   600 mg Oral BID   pneumococcal 20-valent conjugate vaccine  0.5 mL Intramuscular Tomorrow-1000   [START ON 08/09/2024] predniSONE   50 mg Oral Q breakfast   Followed by   NOREEN ON 08/16/2024] predniSONE   40 mg Oral Q breakfast   Followed by   NOREEN ON 08/23/2024] predniSONE   30 mg Oral Q breakfast   Followed by   NOREEN ON 08/30/2024] predniSONE   20 mg Oral Q breakfast   Followed by   NOREEN ON  09/06/2024] predniSONE   10 mg Oral Q breakfast   predniSONE   60 mg Oral Q breakfast   senna  1 tablet Oral BID   sodium chloride  flush  10-40 mL Intracatheter Q12H   sulfamethoxazole -trimethoprim   1 tablet Oral Q M,W,F   Continuous Infusions:     LOS: 13 days     John KANDICE Hoots, MD 08/07/2024, 1:00 PM   "

## 2024-08-07 NOTE — Progress Notes (Signed)
 "  NAME:  John [name], MRN:  [id number], DOB:  1946/06/19, LOS: 13 ADMISSION DATE:  07/25/2024, CONSULTATION DATE: 07/27/2024 REFERRING MD: [facility], CHIEF COMPLAINT: Acute on chronic hypoxic respiratory failure  History of Present Illness:  50 yoM with PMH significant for former smoker (quit 20-69yrs ago), metastatic small cell lung cancer., prostate cancer 2013 and urothelial carcinoma 2022, left kidney lesion s/p SBRT 04/2024, HTN, aortic regurgitation, type 1 second degree AV block, cataracts    Pt is followed by Dr. Sherrod with oncology for metastatic small cell lung cancer.  Initially dx 07/2023, s/p palliative RT completed 09/19/23, and palliative chemo with carboplatin  + etoposide  + durvalumab , currently maintained on durvalumab , last dose 12/17.  On CXR 12/30 developed bilateral lobe patchy infiltrates.  Started having some exertional SOB.  Was called in rx doxycycline that he started 1/13 and then presented to ER 1/14 due to worsening SOB.  Denies cough but reports several days ago had speck of blood but nothing since.   Denies fever, chills, cough, chest pain, sick contacts, orthopnea, N/V, dysphagia or choking episodes, or LE swelling/ pain.  Lives alone, uses a cane, and reports is otherwise independent in ADLs except for doesn't drive anymore.  Is not on home oxygen  or uses any inhalers/ nebs at baseline.    Found to be hypoxic on admit at 68% on RA, afebrile, WBC 15, PCT 0.11, SARs, flu, RSV neg, MRSA PCR neg.  CTA PE neg for PE but showing centrilobular and paraseptal emphysema with patchy consolidation and GGO bilaterally, moderate partially loculated left pleural effusion, and s/p radiation changes in upper and lower lobes bilaterally, interval enlargement of mediastinal lymph nodes, and enlarged pulmonic trunk indicative of pulmonary arterial hypertension. Admitted to [facility].  S/p empiric azithro and ctx on 1/14.  Oncology following, changed to [facility] and started on steroids started 1/15.   Pulmonary consulted 1/16 given CT findings and ongoing HHFNC of 60% and 50L.  Pt reports feeling better since admit.  Currently no SOB at rest and continues to deny cough.    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   worked up with cardiology dr hochrein and f/u prn   Nocturia 02/11/2021   Prostate CA (HCC) 2024   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   Wears glasses 02/11/2021   Significant Hospital Events: Including procedures, antibiotic start and stop dates in addition to other pertinent events   1/14 Admit 1/15 steroids added, HHFNC 75%-80%, 50L 1/19 HF 45>60% 1/24 on high flow nasal cannula, 70%, 55 L 1/26 remains on heated high flow 80% FiO2 50 L oxygen  saturations 96 and above  Antibiotics  Azithromycin  1/14 Zosyn  1/16-1/22 Bactrim  1/23 -  Interim History / Subjective:  No significant events overnight Sleeping on initial rounds  today Increased FiO2 needs, 50L/FiO2 80% Appears more comfortable  Objective:   Blood pressure (!) 115/52, pulse 72, temperature 97.6 F (36.4 C), temperature source Oral, resp. rate (!) 26, height 6' (1.829 m), weight 63.8 kg, SpO2 99%.    FiO2 (%):  [70 %-90 %] 70 %   Intake/Output Summary (Last 24 hours) at 08/07/2024 9191 Last data filed at 08/07/2024 0600 Gross per 24 hour  Intake 20 ml  Output  1050 ml  Net -1030 ml   Filed Weights   07/27/24 1822  Weight: 63.8 kg   Physical Examination: General: Chronically ill-appearing older man in NAD. HEENT: [place]/AT, anicteric sclera, PERRL, moist mucous membranes. Neuro: Drowsy, but wakes easily to voice. Responds to verbal stimuli. Following commands consistently. CV: RRR, no m/g/r. PULM: Breathing tachypneic but unlabored on HHFNC (50L/FiO2 80%). Lung fields diminished throughout. GI: Soft, nontender, nondistended. Normoactive bowel sounds. Extremities: No significant LE edema noted. Skin: Warm/dry, no rashes.  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 Hepatitis B surface antigen negative, QuantiFERON pending. Concern for lymphangitic spread of cancer - Supplemental O2 support to maintain goal SpO2 > 90% - Continue HHFNC - Pulmonary hygiene - Prednisone  taper in place - Bactrim  for PJP ppx - F/u Quant Gold - Aspiration precautions - BZDs PRN - Remains DNR/DNI per his wishes  Signature:   Corean CHRISTELLA Erinne Gillentine, PA-C [facility] 08/07/24 10:00 AM  Please see [email or web address] for pager details.  From 7A-7P if no response, please call [phone] After hours, please call ELink [phone] "

## 2024-08-07 NOTE — Progress Notes (Signed)
 Occupational Therapy Treatment Patient Details Name: John [name] MRN: [id number] DOB: [birth year] Today's Date: 08/07/2024   History of present illness John [name] is a 79 yr old male admitted to the hospital with shortness of breath on 07/25/24. He was found to have acute respiratory failure with hypoxia and multi-focal PNA requiring heated high flow O2. PMH: current small cell lung cancer s/p brachytherapy, bladder CA, HTN, aortic regurgitation, arthritis, psoriasis   OT comments  The pt was pleasant and motivated to participate in the session. He required CGA for supine to sit. He demonstrated good sitting balance EOB. He stated he was able to get up to the bedside commode for toileting management with staff earlier today, requiring min assist overall. He performed sit to stand using a RW, then he was instructed on lateral, forwards, and backwards stepping to facilitate improved standing tolerance and strengthening needed to achieve progressive ADL performance. He performed 2 sets of the aforementioned, needing a seated therapeutic rest break between sets, as well instruction on pursed lip breathing. His heart rate increased up to 118 bpm with activity and his O2 decreased to 88% on HHFNC. He further used a light resistance exercise band to perform 10 reps and 1 set of upper body exercises, specifically vertical pulls, bicep curls, and horizontal exchanges. Compromised endurance, the need for increased supplemental O2, and increased shortness of breath impact the pt's ADL performance and overall functional independence. Continue OT plan of care. Patient will benefit from continued inpatient follow up therapy, <3 hours/day.       If plan is discharge home, recommend the following:  Assistance with cooking/housework;Assist for transportation;A little help with walking and/or transfers;A little help with bathing/dressing/bathroom   Equipment Recommendations  BSC/3in1;Tub/shower bench     Recommendations for Other Services      Precautions / Restrictions Precautions Precautions: Fall Precaution/Restrictions Comments: watch O2 on HHFNC Restrictions Weight Bearing Restrictions Per Provider Order: No       Mobility Bed Mobility Overal bed mobility: Needs Assistance Bed Mobility: Supine to Sit, Sit to Supine     Supine to sit: Contact guard, HOB elevated, Used rails Sit to supine: Supervision        Transfers Overall transfer level: Needs assistance Equipment used: Rolling walker (2 wheels) Transfers: Sit to/from Stand Sit to Stand: Min assist, From elevated surface           General transfer comment: Once in standing using a RW for support, the pt was instructed on lateral, forwards and backwards stepping to facilitate improved standing tolerance and strengthening needed to achieve progressive ADL performance. He performed 2 sets of the aforementioned, needing a seated therapeutic rest break between sets.     Balance     Sitting balance-Leahy Scale: Good         Standing balance comment: Needs RW and CGA           ADL either performed or assessed with clinical judgement   ADL Overall ADL's : Needs assistance/impaired            Communication Communication Communication: No apparent difficulties   Cognition Arousal: Alert Behavior During Therapy: WFL for tasks assessed/performed               OT - Cognition Comments: Oriented x4                 Following commands: Intact        Cueing   Cueing Techniques: Verbal cues  Pertinent Vitals/ Pain       Pain Assessment Pain Assessment: No/denies pain   Frequency  Min 2X/week        Progress Toward Goals  OT Goals(current goals can now be found in the care plan section)     Acute Rehab OT Goals OT Goal Formulation: With patient Time For Goal Achievement: 08/12/24 Potential to Achieve Goals: Good  Plan         AM-PAC OT 6 Clicks Daily  Activity     Outcome Measure   Help from another person eating meals?: None Help from another person taking care of personal grooming?: A Little Help from another person toileting, which includes using toliet, bedpan, or urinal?: A Little Help from another person bathing (including washing, rinsing, drying)?: A Lot Help from another person to put on and taking off regular upper body clothing?: A Little Help from another person to put on and taking off regular lower body clothing?: A Lot 6 Click Score: 17    End of Session Equipment Utilized During Treatment: Oxygen   OT Visit Diagnosis: Muscle weakness (generalized) (M62.81);Unsteadiness on feet (R26.81);Other abnormalities of gait and mobility (R26.89)   Activity Tolerance Other (comment) (Fair+ tolerance. Limited by compromised endurance and shortness of breath)   Patient Left in bed;with call bell/phone within reach;with bed alarm set   Nurse Communication Other (comment) (Nurse cleared the pt for therapy participation)        Time: 1640-1700 OT Time Calculation (min): 20 min  Charges: OT General Charges $OT Visit: 1 Visit OT Treatments $Therapeutic Activity: 8-22 mins    Delanna JINNY Lesches, OTR/L 08/07/2024, 5:45 PM

## 2024-08-08 DIAGNOSIS — J9601 Acute respiratory failure with hypoxia: Secondary | ICD-10-CM | POA: Diagnosis not present

## 2024-08-08 DIAGNOSIS — J704 Drug-induced interstitial lung disorders, unspecified: Secondary | ICD-10-CM | POA: Diagnosis not present

## 2024-08-08 DIAGNOSIS — R4589 Other symptoms and signs involving emotional state: Secondary | ICD-10-CM

## 2024-08-08 DIAGNOSIS — T451X5A Adverse effect of antineoplastic and immunosuppressive drugs, initial encounter: Secondary | ICD-10-CM | POA: Diagnosis not present

## 2024-08-08 DIAGNOSIS — C799 Secondary malignant neoplasm of unspecified site: Secondary | ICD-10-CM | POA: Diagnosis not present

## 2024-08-08 DIAGNOSIS — J984 Other disorders of lung: Secondary | ICD-10-CM

## 2024-08-08 DIAGNOSIS — Z7189 Other specified counseling: Secondary | ICD-10-CM

## 2024-08-08 DIAGNOSIS — J188 Other pneumonia, unspecified organism: Secondary | ICD-10-CM | POA: Diagnosis not present

## 2024-08-08 DIAGNOSIS — C7800 Secondary malignant neoplasm of unspecified lung: Secondary | ICD-10-CM | POA: Diagnosis not present

## 2024-08-08 MED ORDER — OXYCODONE HCL 5 MG PO TABS: 5.0000 mg | ORAL_TABLET | ORAL | Status: DC | PRN

## 2024-08-08 NOTE — Progress Notes (Signed)
 I met with Cortlandt to provide emotional and spiritual support.  He shared about his upbringing  and the values that his parents taught.  His faith is very important to him and he is grateful to have been able to live such a long life and he attributes the positive things in his life to God.  He has 2 sisters who are still living and 3 others who have already passed.  He lives with his sisters and he is eager to get home with them. We will continue to follow up and provide emotional support for the time that he is in the hospital.  If more urgent needs arise, please page us .   Elia Rockie Sofia,  bcc Pager, [phone]

## 2024-08-08 NOTE — Progress Notes (Signed)
 " Daily Progress Note   Patient Name: John [name]       Date: 08/08/2024 DOB: 06-Mar-1946  Age: 79 y.o. MRN#: [id number] Attending Physician: Leotis Bogus, MD Primary Care Physician: Practice, [facility] Family Admit Date: 07/25/2024 Length of Stay: 14 days  Reason for Follow-up: Establishing goals of care, Non pain symptom management, and Pain control  Subjective:   CC: Patient is still having increased work of breathing.  Following up regarding goals of care and symptom management.  Reviewed hospitalist document for today discussing patient continuing to need lots of supplemental oxygen  support.  Reviewed PCCM documentation from today as well and also discussed care with them regarding medical updates.  Patient's HHFNC needs have increased to 50 L/min and FiO2 70%.  Discussed care with oncology team today as well to coordinate care.  Oncology team planning to follow-up to visit with patient particularly in light of his increasing oxygen  requirements.  At time of EMR review in past 24 hours, patient has not required any as needed opioids to help with work of breathing. Personally reviewed chest x-ray from 08/07/2024 noting continued bilateral opacities.  Presented to bedside to meet with patient.  Introduced myself as a member of the palliative medicine team.  Patient lying in bed with noted increased work of breathing at rest.  Inquired about patient's symptom burden at this time.  Patient denies any symptoms of pain.  Patient does agree with having increased work of breathing.  Spent time explaining use of as needed opioids to help with work of breathing since patient has been working so hard continuously on [facility] and particularly now with increasing needs.  Patient agreeing with trying as needed oxycodone  to see if this helps with his work of breathing.  Noted could further adjust if needed.  Encouraged further conversations with oncology team who is planning to visit patient as well.  Spent  time providing emotional support via active listening.  Noted palliative medicine team will continue to follow along with patient's medical journey.  Discussed care with PCCM team and oncology team to coordinate care.  Objective:   Vital Signs:  BP (!) 114/47   Pulse 73   Temp (!) 97 F (36.1 C) (Axillary)   Resp (!) 22   Ht 6' (1.829 m)   Wt 63.8 kg   SpO2 (!) 87%   BMI 19.08 kg/m   Physical Exam: General: NAD, alert, chronically ill-appearing, cachectic, frail Cardiovascular: Tachycardia noted Respiratory: increased work of breathing noted, on HHFNC 50 L/min with FiO2 70% Abdomen: not distended Neuro: A&Ox4, following commands easily Psych: appropriately answers all questions  Assessment & Plan:   Assessment: Patient is a 79 year old male with a past medical history of tobacco use (quit 20-25 years ago), prostate cancer in 2013, urothelial carcinoma 2022, 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  During hospitalization patient has received management for acute hypoxic respiratory failure in the setting of concern for durvalumab  induced pneumonitis and possible concern for lymphangitic spread of cancer.  PCCM and oncology consulted for recommendations.  Palliative medicine team consulted to assist with goals of care and symptom management.  Recommendations/Plan: # Complex medical decision making/goals of care:  - Patient has stated previously desire to have input from oncology team to assist with goals of care conversations.  At this time patient continues  with appropriate medical interventions.  Appropriately remains DNR/DNI.  Palliative medicine team continuing to engage in conversations as able and appropriate.  -  Code Status: Limited: Do not attempt resuscitation (DNR)  -DNR-LIMITED -Do Not Intubate/DNI   # Symptom management: Patient is receiving these palliative interventions for symptom management with an intent to improve quality of life.   - Shortness of breath, acute in setting of acute hypoxic respiratory failure with concern for durvalumab  induced pneumonitis or possible lymphangitic spread of cancer Patient has continued to have increased work of breathing while on HHFNC, now having increasing oxygen  requirements.  Encouraged use of as needed opioids to help with management of dyspnea and work of breathing.  Noted can add IV medications if needed for this.   - Continue oral oxycodone  5 mg every 4 hours as needed  # Discharge Planning: To Be Determined  Discussed with: Patient, oncology team, PCCM team, RN  Thank you for allowing the palliative care team to participate in the care [name].  Tinnie Radar, DO Palliative Care Provider PMT # [phone]  If patient remains symptomatic despite maximum doses, please call PMT at [phone] between 0700 and 1900. Outside of these hours, please call attending, as PMT does not have night coverage.  Personally spent 35 minutes in patient care including extensive chart review (labs, imaging, progress/consult notes, vital signs), medically appropraite exam, discussed with treatment team, education to patient, medication review and management, and coordination of care.    "

## 2024-08-08 NOTE — Progress Notes (Addendum)
 John [name]   DOB:Nov 19, 1945   FM#:[id number]      CLINICAL SUMMARY:  John [name] is a 79 year old male patient who presented on 07/25/2024 with complaints of progressive shortness of breath and cough. Oncologic history is significant for lung cancer, on immunotherapy. Now diagnosed with pneumonia. Medical oncology following.    INTERVAL HISTORY:  -Patient diagnosed 08/08/2023 with lung cancer.  Status post radiation therapy and chemo immuno therapy.  Also with history of prostate CVA and urothelial CA with kidney lesion. -Admitted with complaints of shortness of breath and cough.  Diagnosed with pneumonia.  Started on steroids and antibiotics.  ASSESSMENT & PLAN:  Shortness of breath Pneumonia Pleural effusion - Patient appears very short of breath while on HFNC, noted with increased use of accessory muscles. - On prednisone  taper dosing, continue as ordered. - Continue respiratory treatments and monitor closely  Extensive stage small cell lung cancer - Status post chemotherapy and immunotherapy. - Durvalumab /immunotherapy on hold. - Medical oncology/Dr. Sherrod following will make further recommendations.  Leukocytosis - WBC 16.9 - Likely due to steroids - Monitor fever curve and CBC with differential  Anemia - Mild - Hemoglobin 12.2 - Continue to monitor CBC with differential  History of prostate cancer History of urothelial carcinoma Left kidney metastatic lesion - Continue to follow with outpatient oncology   Code Status DNR-limited  Subjective:  Patient seen awake and alert laying in bed.  Continues to appear very short of breath although states he is okay.  Use of accessory muscles even while on HFNC.  Denies pain.  Objective:   Intake/Output Summary (Last 24 hours) at 08/08/2024 1017 Last data filed at 08/08/2024 0600 Gross per 24 hour  Intake 20 ml  Output 1800 ml  Net -1780 ml     PHYSICAL EXAMINATION: ECOG PERFORMANCE STATUS: 4 - Bedbound  Vitals:    08/08/24 0828 08/08/24 0900  BP:  (!) 103/39  Pulse: 87 77  Resp: (!) 33 (!) 29  Temp:    SpO2: (!) 88% 92%   Filed Weights   07/27/24 1822  Weight: 140 lb 10.5 oz (63.8 kg)    GENERAL: alert, + moderate respiratory distress + very ill-appearing SKIN: skin color, texture, turgor are normal, no rashes or significant lesions EYES: normal, conjunctiva are pink and non-injected, sclera clear OROPHARYNX: no exudate, no erythema and lips, buccal mucosa, and tongue normal  NECK: supple, thyroid  normal size, non-tender, without nodularity LYMPH: no palpable lymphadenopathy in the cervical, axillary or inguinal LUNGS: + Diminished to auscultation and + increased breathing effort HEART: regular rate & rhythm and no murmurs and no lower extremity edema ABDOMEN: abdomen soft, non-tender and normal bowel sounds MUSCULOSKELETAL: no cyanosis of digits and no clubbing  PSYCH: alert & oriented x 3 with fluent speech NEURO: no focal motor/sensory deficits   All questions were answered. The patient knows to call the clinic with any problems, questions or concerns.   I personally spent a total of 40 minutes minutes in the care of the patient today including preparing to see the patient, performing a medically appropriate exam/evaluation, referring and communicating with other health care professionals, documenting clinical information in the EHR, communicating results, and coordinating care.    Olam PARAS Rouson, NP 08/08/2024 10:17 AM    Labs Reviewed:  Lab Results  Component Value Date   WBC 16.9 (H) 08/06/2024   HGB 12.2 (L) 08/06/2024   HCT 39.6 08/06/2024   MCV 98.8 08/06/2024   PLT 213 08/06/2024  Recent Labs    07/25/24 1314 07/25/24 1328 08/02/24 0351 08/03/24 0452 08/06/24 0412  NA 139   < > 142 142 140  K 4.1   < > 4.1 4.1 4.4  CL 104   < > 104 105 103  CO2 29   < > 30 32 32  GLUCOSE 197*   < > 90 155* 91  BUN 17   < > 19 19 18   CREATININE 0.92   < > 0.95 0.78 0.76   CALCIUM 10.3   < > 9.6 9.4 9.8  GFRNONAA >60   < > >60 >60 >60  PROT 7.3  --  6.0*  --  6.1*  ALBUMIN 3.4*  --  3.0*  --  3.1*  AST 27  --  31  --  25  ALT 11  --  44  --  43  ALKPHOS 123  --  105  --  90  BILITOT 0.5  --  0.7  --  0.3   < > = values in this interval not displayed.    Studies Reviewed:   DG Chest 1 View Result Date: 08/07/2024 EXAM: 1 VIEW(S) XRAY OF THE CHEST 08/07/2024 01:19:00 PM COMPARISON: 08/03/2024 CLINICAL HISTORY: Shortness of breath. FINDINGS: LINES, TUBES AND DEVICES: Right chest port stable in place. LUNGS AND PLEURA: Stable patchy bilateral airspace opacities. Stable small left pleural effusion. Stable elevated left hemidiaphragm. No pneumothorax. HEART AND MEDIASTINUM: Aortic atherosclerosis. No acute abnormality of the cardiac and mediastinal silhouettes. BONES AND SOFT TISSUES: No acute osseous abnormality. IMPRESSION: 1. Stable patchy bilateral airspace opacities. 2. Stable small left pleural effusion. 3. Stable elevated left hemidiaphragm. Electronically signed by: Oneil Devonshire MD 08/07/2024 08:56 PM EST RP Workstation: [id number]   DG CHEST PORT 1 VIEW Result Date: 08/03/2024 CLINICAL DATA:  Shortness of breath EXAM: PORTABLE CHEST 1 VIEW COMPARISON:  Two days ago FINDINGS: The heart size and mediastinal contours are within normal limits. Right internal jugular Port-A-Cath is unchanged. Stable bilateral lung opacities are noted concerning for multifocal pneumonia. Elevated left hemidiaphragm is noted. Minimal left pleural effusion is noted. The visualized skeletal structures are unremarkable. IMPRESSION: Stable bilateral lung opacities are noted concerning for multifocal pneumonia. Minimal left pleural effusion is noted. Electronically Signed   By: Lynwood Landy Raddle M.D.   On: 08/03/2024 16:04   DG CHEST PORT 1 VIEW Result Date: 08/01/2024 EXAM: 1 VIEW XRAY OF THE CHEST 08/01/2024 05:28:00 AM COMPARISON: Portable chest 07/29/24. CLINICAL HISTORY: FINDINGS: LINES,  TUBES AND DEVICES: Right chest IJ approach port catheter again terminates in the upper right atrium. Multiple overlying telemetry leads. LUNGS AND PLEURA: Multifocal bilateral airspace disease with right apical sparing continues to be seen with a small left pleural effusion. Overall aeration seems unchanged. HEART AND MEDIASTINUM: Stable mediastinum with calcification in the transverse aorta. The cardiac size is normal. BONES AND SOFT TISSUES: There is slight thoracic dextroscoliosis with no new osseous findings. Mild elevation of left hemidiaphragm. IMPRESSION: 1. Multifocal bilateral airspace disease with right apical sparing, small left pleural effusion, and mild elevation of the left hemidiaphragm, without significant interval change. Electronically signed by: Francis Quam MD 08/01/2024 05:53 AM EST RP Workstation: [id number]   DG CHEST PORT 1 VIEW Result Date: 07/29/2024 CLINICAL DATA:  Shortness of breath. EXAM: PORTABLE CHEST 1 VIEW COMPARISON:  Chest radiograph dated 07/25/2024. FINDINGS: Right-sided Port-A-Cath in similar position. No significant interval change in bilateral pulmonary opacities and small left pleural effusion compared to prior radiograph. No pneumothorax. Stable cardiac  silhouette no acute osseous pathology. IMPRESSION: No significant interval change. Electronically Signed   By: Vanetta Chou M.D.   On: 07/29/2024 16:18   ECHOCARDIOGRAM COMPLETE Result Date: 07/27/2024    ECHOCARDIOGRAM REPORT   Patient Name:   TEE RICHESON Date of Exam: 07/27/2024 Medical Rec #:  [id number]    Height:       72.0 in Accession #:    [id number]   Weight:       150.0 lb Date of Birth:  02/28/1946    BSA:          1.885 m Patient Age:    78 years     BP:           106/44 mmHg Patient Gender: M            HR:           80 bpm. Exam Location:  Inpatient Procedure: 2D Echo, Cardiac Doppler and Color Doppler (Both Spectral and Color            Flow Doppler were utilized during procedure). Indications:     Aortic regurgitation I35.1  History:        Patient has no prior history of Echocardiogram examinations.                 Risk Factors:Hypertension.  Sonographer:    Tinnie Gosling RDCS Referring Phys: [id number] BINAYA DAHAL IMPRESSIONS  1. Left ventricular ejection fraction, by estimation, is 60 to 65%. The left ventricle has normal function. The left ventricle has no regional wall motion abnormalities. There is mild left ventricular hypertrophy.  2. Right ventricular systolic function is normal. The right ventricular size is normal.  3. Trivial mitral valve regurgitation.  4. The aortic valve is tricuspid. Aortic valve regurgitation is mild to moderate. Aortic valve sclerosis/calcification is present, without any evidence of aortic stenosis.  5. The inferior vena cava is normal in size with greater than 50% respiratory variability, suggesting right atrial pressure of 3 mmHg. FINDINGS  Left Ventricle: Left ventricular ejection fraction, by estimation, is 60 to 65%. The left ventricle has normal function. The left ventricle has no regional wall motion abnormalities. The left ventricular internal cavity size was normal in size. There is  mild left ventricular hypertrophy. Right Ventricle: The right ventricular size is normal. Right vetricular wall thickness was not assessed. Right ventricular systolic function is normal. Left Atrium: Left atrial size was normal in size. Right Atrium: Right atrial size was normal in size. Pericardium: There is no evidence of pericardial effusion. Mitral Valve: There is mild thickening of the mitral valve leaflet(s). Mild mitral annular calcification. Trivial mitral valve regurgitation. Tricuspid Valve: The tricuspid valve is normal in structure. Tricuspid valve regurgitation is mild. Aortic Valve: The aortic valve is tricuspid. Aortic valve regurgitation is mild to moderate. Aortic regurgitation PHT measures 611 msec. Aortic valve sclerosis/calcification is present, without any evidence  of aortic stenosis. Pulmonic Valve: The pulmonic valve was normal in structure. Pulmonic valve regurgitation is not visualized. Aorta: The aortic root and ascending aorta are structurally normal, with no evidence of dilitation. Venous: The inferior vena cava is normal in size with greater than 50% respiratory variability, suggesting right atrial pressure of 3 mmHg. IAS/Shunts: No atrial level shunt detected by color flow Doppler.  LEFT VENTRICLE PLAX 2D LVIDd:         3.90 cm     Diastology LVIDs:         2.50 cm  LV e' medial:    6.09 cm/s LV PW:         1.20 cm     LV E/e' medial:  7.8 LV IVS:        1.10 cm     LV e' lateral:   7.29 cm/s LVOT diam:     2.30 cm     LV E/e' lateral: 6.5 LV SV:         80 LV SV Index:   42 LVOT Area:     4.15 cm  LV Volumes (MOD) LV vol d, MOD A2C: 82.3 ml LV vol d, MOD A4C: 62.2 ml LV vol s, MOD A2C: 27.3 ml LV vol s, MOD A4C: 31.3 ml LV SV MOD A2C:     55.0 ml LV SV MOD A4C:     62.2 ml LV SV MOD BP:      51.2 ml RIGHT VENTRICLE             IVC RV S prime:     11.10 cm/s  IVC diam: 1.90 cm TAPSE (M-mode): 1.3 cm LEFT ATRIUM             Index        RIGHT ATRIUM           Index LA diam:        2.90 cm 1.54 cm/m   RA Area:     13.10 cm LA Vol (A2C):   20.0 ml 10.61 ml/m  RA Volume:   30.50 ml  16.18 ml/m LA Vol (A4C):   28.9 ml 15.33 ml/m LA Biplane Vol: 25.4 ml 13.47 ml/m  AORTIC VALVE LVOT Vmax:   104.00 cm/s LVOT Vmean:  61.400 cm/s LVOT VTI:    0.192 m AI PHT:      611 msec  AORTA Ao Root diam: 3.40 cm Ao Asc diam:  3.70 cm MITRAL VALVE               TRICUSPID VALVE MV Area (PHT): 3.55 cm    TR Peak grad:   34.1 mmHg MV E velocity: 47.60 cm/s  TR Vmax:        292.00 cm/s MV A velocity: 87.80 cm/s MV E/A ratio:  0.54        SHUNTS                            Systemic VTI:  0.19 m                            Systemic Diam: 2.30 cm Vina Gull MD Electronically signed by Vina Gull MD Signature Date/Time: 07/27/2024/12:21:41 PM    Final    CT Angio Chest Pulmonary Embolism  (PE) W or WO Contrast Result Date: 07/25/2024 CLINICAL DATA:  Lung cancer recent pneumonia, decreased O2 sats weakness. Shortness of breath and abdominal pain. * Tracking Code: BO * EXAM: CT ANGIOGRAPHY CHEST CT ABDOMEN AND PELVIS WITH CONTRAST TECHNIQUE: Multidetector CT imaging of the chest was performed using the standard protocol during bolus administration of intravenous contrast. Multiplanar CT image reconstructions and MIPs were obtained to evaluate the vascular anatomy. Multidetector CT imaging of the abdomen and pelvis was performed using the standard protocol during bolus administration of intravenous contrast. RADIATION DOSE REDUCTION: This exam was performed according to the departmental dose-optimization program which includes automated exposure control, adjustment of the mA and/or kV according to patient  size and/or use of iterative reconstruction technique. CONTRAST:  75mL OMNIPAQUE  IOHEXOL  350 MG/ML SOLN COMPARISON:  CT abdomen pelvis 05/29/2024 PET 03/05/2024 and CT chest 07/25/2024. FINDINGS: CTA CHEST FINDINGS Cardiovascular: Negative for pulmonary embolus. Right IJ Port-A-Cath terminates in the low SVC or SVC RA junction. Atherosclerotic calcification of the aorta and coronary arteries. Enlarged pulmonic trunk and heart. Left ventricle is dilated. No pericardial effusion. Mediastinum/Nodes: Mediastinal lymph nodes measure up to 13 mm in the low right paratracheal station, previously 6 mm. Soft tissue thickening in the left hilum. Right hilar lymph nodes measure up to 10 mm. Air in the esophagus can be seen with dysmotility. Lungs/Pleura: Centrilobular and paraseptal emphysema. Patchy consolidation and ground-glass bilaterally, new from 03/05/2024. Post radiation architectural distortion in the upper and lower lobes. Moderate partially loculated left pleural effusion. Airway is unremarkable. Musculoskeletal: Degenerative changes in the spine. No worrisome lytic or sclerotic lesions. Review of the  MIP images confirms the above findings. CT ABDOMEN and PELVIS FINDINGS Hepatobiliary: Tiny low-attenuation lesion in the left hepatic lobe, too small to characterize. Liver and gallbladder are otherwise unremarkable. No biliary ductal dilatation. Pancreas: Negative. Spleen: Negative. Adrenals/Urinary Tract: Adrenal glands are unremarkable. Low-attenuation lesions in the kidneys. No specific follow-up necessary. Ureters are decompressed. Bladder is grossly unremarkable. Stomach/Bowel: Stomach, small bowel, appendix and colon are unremarkable. Vascular/Lymphatic: Atherosclerotic calcification of the aorta. No pathologically enlarged lymph nodes. 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 Other: No free fluid. Musculoskeletal: Degenerative changes in the spine. Dextroconvex scoliosis. No worrisome lytic or sclerotic lesions. Review of the MIP images confirms the above findings. IMPRESSION: 1. Negative for pulmonary embolus. 2. Multilobar pneumonia. 3. 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 Evaluation for recurrent disease is challenging due to surrounding acute ground-glass and consolidation. 4. Interval enlargement of mediastinal lymph nodes may be reactive in etiology. Recommend attention on follow-up. 5. Partially loculated moderate left pleural effusion. 6. Aortic atherosclerosis (ICD10-I70.0). Coronary artery calcification. 7. Enlarged pulmonic trunk, indicative of pulmonary arterial hypertension. Electronically Signed   By: Newell Eke M.D.   On: 07/25/2024 14:29   CT ABDOMEN PELVIS WO CONTRAST Result Date: 07/25/2024 CLINICAL DATA:  Lung cancer recent pneumonia, decreased O2 sats weakness. Shortness of breath and abdominal pain. * Tracking Code: BO * EXAM: CT ANGIOGRAPHY CHEST CT ABDOMEN AND PELVIS WITH CONTRAST TECHNIQUE: Multidetector CT imaging of the chest was performed using the standard protocol during bolus administration of intravenous contrast. Multiplanar CT  image reconstructions and MIPs were obtained to evaluate the vascular anatomy. Multidetector CT imaging of the abdomen and pelvis was performed using the standard protocol during bolus administration of intravenous contrast. RADIATION DOSE REDUCTION: This exam was performed according to the departmental dose-optimization program which includes automated exposure control, adjustment of the mA and/or kV according to patient size and/or use of iterative reconstruction technique. CONTRAST:  75mL OMNIPAQUE  IOHEXOL  350 MG/ML SOLN COMPARISON:  CT abdomen pelvis 05/29/2024 PET 03/05/2024 and CT chest 07/25/2024. FINDINGS: CTA CHEST FINDINGS Cardiovascular: Negative for pulmonary embolus. Right IJ Port-A-Cath terminates in the low SVC or SVC RA junction. Atherosclerotic calcification of the aorta and coronary arteries. Enlarged pulmonic trunk and heart. Left ventricle is dilated. No pericardial effusion. Mediastinum/Nodes: Mediastinal lymph nodes measure up to 13 mm in the low right paratracheal station, previously 6 mm. Soft tissue thickening in the left hilum. Right hilar lymph nodes measure up to 10 mm. Air in the esophagus can be seen with dysmotility. Lungs/Pleura: Centrilobular and paraseptal emphysema. Patchy consolidation and ground-glass  bilaterally, new from 03/05/2024. Post radiation architectural distortion in the upper and lower lobes. Moderate partially loculated left pleural effusion. Airway is unremarkable. Musculoskeletal: Degenerative changes in the spine. No worrisome lytic or sclerotic lesions. Review of the MIP images confirms the above findings. CT ABDOMEN and PELVIS FINDINGS Hepatobiliary: Tiny low-attenuation lesion in the left hepatic lobe, too small to characterize. Liver and gallbladder are otherwise unremarkable. No biliary ductal dilatation. Pancreas: Negative. Spleen: Negative. Adrenals/Urinary Tract: Adrenal glands are unremarkable. Low-attenuation lesions in the kidneys. No specific follow-up  necessary. Ureters are decompressed. Bladder is grossly unremarkable. Stomach/Bowel: Stomach, small bowel, appendix and colon are unremarkable. Vascular/Lymphatic: Atherosclerotic calcification of the aorta. No pathologically enlarged lymph nodes. 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 Other: No free fluid. Musculoskeletal: Degenerative changes in the spine. Dextroconvex scoliosis. No worrisome lytic or sclerotic lesions. Review of the MIP images confirms the above findings. IMPRESSION: 1. Negative for pulmonary embolus. 2. Multilobar pneumonia. 3. 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 Evaluation for recurrent disease is challenging due to surrounding acute ground-glass and consolidation. 4. Interval enlargement of mediastinal lymph nodes may be reactive in etiology. Recommend attention on follow-up. 5. Partially loculated moderate left pleural effusion. 6. Aortic atherosclerosis (ICD10-I70.0). Coronary artery calcification. 7. Enlarged pulmonic trunk, indicative of pulmonary arterial hypertension. Electronically Signed   By: Newell Eke M.D.   On: 07/25/2024 14:29   DG Chest Port 1 View Result Date: 07/25/2024 EXAM: 1 VIEW(S) XRAY OF THE CHEST 07/25/2024 01:55:00 AM COMPARISON: 07/10/2024 CLINICAL HISTORY: Questionable sepsis; evaluate for abnormality. FINDINGS: LINES, TUBES AND DEVICES: Right chest port with tip in the right atrium. LUNGS AND PLEURA: New airspace opacity in Right Lower Lung Zone. Hazy opacity throughout Left Lung. Bandlike scarring in Right Mid, unchanged. Elevated Left Hemidiaphragm. Small Left Pleural Effusion. Hazy airspace opacities have worsened throughout the Left Lung. Patchy airspace opacities in the Right Lung Base, somewhat more nodular, worsened in the interim. No pneumothorax. HEART AND MEDIASTINUM: The tip of the right chest port is in the right atrium. The cardiac and mediastinal silhouettes are otherwise without acute abnormality. BONES  AND SOFT TISSUES: No acute osseous abnormality. Multilevel thoracic osteophytosis. IMPRESSION: 1. Worsening bilateral airspace opacities, including worsening patchy right basilar opacities and worsening hazy opacities throughout the left lung, concerning for worsening multifocal pneumonia. 2. Small left pleural effusion. Electronically signed by: Rogelia Myers MD 07/25/2024 02:06 PM EST RP Workstation: [id number]   DG Chest 2 View Result Date: 07/21/2024 CLINICAL DATA:  Shortness of breath. Left lung cancer treated with chemotherapy and radiation therapy. EXAM: CHEST - 2 VIEW COMPARISON:  05/29/2024 and chest CTA dated 05/12/2024. FINDINGS: Normal-sized heart. Bilateral postradiation changes with progression in the left upper lung zone and interval atelectasis in the right upper lung zone. Interval mild patchy density in both lower lung zones. Stable elevation of the left hemidiaphragm with tenting. The right jugular porta catheter is unchanged with its tip in the region of the superior cavoatrial junction. Mild scoliosis. Mild-to-moderate right glenohumeral degenerative changes. IMPRESSION: 1. Interval mild patchy density in both lower lung zones, suspicious for pneumonia. 2. Bilateral postradiation changes with progression in the left upper lung zone and interval atelectasis in the right upper lung zone. Electronically Signed   By: Elspeth Bathe M.D.   On: 07/21/2024 15:55    ADDENDUM: Hematology/Oncology Attending: The patient is seen and examined today.  Again his 2 sisters were at the bedside.  He was diagnosed with extensive stage small cell lung cancer  in January 2025 and initially underwent a course of concurrent chemoimmunotherapy with carboplatin , etoposide  and durvalumab  every 4 weeks then has been on maintenance treatment with immunotherapy with durvalumab  for another 8 cycles.  He is currently in the hospital with multifocal pneumonia.  He is slowly improving but continues to have significant  shortness of breath with exertion.  I had a lengthy discussion with the patient again about his condition.  I agree with the intensive treatment for his multifocal pneumonia.  He received only first-line treatment for his small cell lung cancer and there are other options to be used in the future if needed but only if he recovers from the multifocal pneumonia and his respiratory status improves. Will continue to monitor the patient with you but I will arrange a follow-up visit for him after discharge for more detailed discussion of his treatment options if his pneumonia resolves. I discussed with the patient the CODE STATUS and he indicated no CODE BLUE. Thank you for taking good care of Mr. Tegtmeyer.  Will continue to follow-up the patient with you and assist in his management on as-needed basis. Disclaimer: This note was dictated with voice recognition software. Similar sounding words can inadvertently be transcribed and may be missed upon review. Sherrod MARLA Sherrod, MD

## 2024-08-08 NOTE — Progress Notes (Signed)
 Decreased Fio2 to 60%, flow 30L

## 2024-08-08 NOTE — Plan of Care (Signed)
  Problem: Education: Goal: Knowledge of General Education information will improve Description: Including pain rating scale, medication(s)/side effects and non-pharmacologic comfort measures Outcome: Progressing   Problem: Activity: Goal: Risk for activity intolerance will decrease Outcome: Progressing   Problem: Nutrition: Goal: Adequate nutrition will be maintained Outcome: Progressing   Problem: Elimination: Goal: Will not experience complications related to bowel motility Outcome: Progressing   Problem: Pain Managment: Goal: General experience of comfort will improve and/or be controlled Outcome: Progressing   Problem: Safety: Goal: Ability to remain free from injury will improve Outcome: Progressing   Problem: Skin Integrity: Goal: Risk for impaired skin integrity will decrease Outcome: Progressing

## 2024-08-08 NOTE — Progress Notes (Signed)
 " PROGRESS NOTE    John [name]  FMW:[id number] DOB: 02-03-1946 DOA: 07/25/2024 PCP: [facility]   Brief Narrative:  This 79 yrs old Male with PMH significant for small cell lung cancer, prostate cancer s/p brachytherapy, bladder cancer,  hypertension, aortic regurgitation, who Lives at home alone,  ambulates with a cane. 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 Sherrod.  Underwent radiation in the past.  Received last dose of immunotherapy with durvalumab  on 12/16. -12/30, he had a chest x-ray done which resulted 10 days later on 1/10 to show bilateral pneumonia.  Patient continued to have symptoms at that time but then suddenly started having shortness of breath, cough and decreased exercise tolerance.SABRA  He does not have any supplemental oxygen  at home. -1/11, He called Dr. Jeannett office and was called in a prescription for doxycycline which he started on 1/13. -1/14, patient felt significantly short of breath and hence presented to the ED.  He was tachypneic,  tachycardic and hypoxic at 68% on room air requiring 15 L of oxygen  on admission.  Respiratory virus panel was negative. -CT angio chest was negative for pulm embolism but showed patchy consolidation and ground glass bilaterally suggestive of multifocal pneumonia.Partially loculated moderate left pleural effusion. Enlarged pulmonic trunk, indicative of pulmonary arterial hypertension. -Subsequently his oxygen  requirement worsened to 60 L, patient received infliximab  08/04/2024.   -Currently on 55 L he reports he feels a little better than yesterday. Patient remains in stepdown unit closely followed by pulmonary.     Assessment & Plan:   Principal Problem:   Multifocal pneumonia Active Problems:   History of lung cancer   Shortness of breath   Sepsis (HCC)   Palliative care by specialist   General  weakness  Acute hypoxic respiratory failure: Chemotherapy induced pneumonitis: It could be Pneumonitis from recent chemotherapy vs pneumonia in the setting of small cell lung cancer vs lymphangitic spread CT of the chest did not reveal any pulmonary embolism but showed multifocal infiltrates consistent with consolidation.   Patient admitted with gradually worsening shortness of breath.  O2 Saturation was mid 60s on room air in the ER.  Initially was given Rocephin  and azithromycin  then changed to Zosyn .   Prior hospitalist discussed with Dr. Sherrod recommended prednisone .  Oxygen  requirement still remains high at  50 L heated high flow nasal cannula, 80% FiO2 satting 98%.  On Bactrim  for PJP prophylaxis. He received infliximab  on 08/04/2024 for pneumonitis secondary to chemotherapy.  He was initially given Solu-Medrol  changed to prednisone  tapering dose. Chest x-ray from 21st showed multifocal bilateral airspace disease with right apical sparing,  small left pleural effusion and mild elevation of the left hemidiaphragm.   Follow-up chest x-ray stable bilateral infiltrates small left pleural effusion 1/23 He finished a course of Zosyn . Chest xray 1/27 showed stable patchy bilateral airspace opacities,  stable elevated left diaphragm. Palliative care following.   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 Sherrod.  Underwent radiation in the past.  Received last dose of immunotherapy with durvalumab  12/16. Oncology following.   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 Last echo from 2018 had shown moderate AI.  Repeat echo showed mild to moderate AI PTA meds- amlodipine 10 mg daily.  Currently  on hold.  Blood pressure continue to be soft 111/46.   H/o prostate cancer s/p brachytherapy H/o bladder  cancer: Follow up Oncology    DVT prophylaxis: Lovenox  Code Status: DNR Family Communication: No family at bedside Disposition Plan:   Status is: Inpatient Remains inpatient appropriate because: Severity of illness.  He is still requiring 50% of supplemental oxygen  via heated high flow  Consultants:  PCCM  Procedures:  Antimicrobials: Anti-infectives (From admission, onward)    Start     Dose/Rate Route Frequency Ordered Stop   08/03/24 1000  sulfamethoxazole -trimethoprim  (BACTRIM  DS) 800-160 MG per tablet 1 tablet        1 tablet Oral Every M-W-F 08/02/24 1206     07/27/24 1400  piperacillin -tazobactam (ZOSYN ) IVPB 3.375 g        3.375 g 12.5 mL/hr over 240 Minutes Intravenous Every 8 hours 07/27/24 1308 08/03/24 1113   07/25/24 1330  cefTRIAXone  (ROCEPHIN ) 2 g in sodium chloride  0.9 % 100 mL IVPB        2 g 200 mL/hr over 30 Minutes Intravenous Once 07/25/24 1315 07/25/24 1409   07/25/24 1330  azithromycin  (ZITHROMAX ) tablet 500 mg        500 mg Oral  Once 07/25/24 1315 07/25/24 1416       Subjective: Patient was seen and examined at bedside.  Overnight events noted. Patient seems comfortable but still requiring HHFNC @50  L of supplemental oxygen .  Objective: Vitals:   08/08/24 0700 08/08/24 0800 08/08/24 0828 08/08/24 0900  BP: (!) 103/37 (!) 108/46  (!) 103/39  Pulse: 71 70 87 77  Resp: (!) 31 (!) 29 (!) 33 (!) 29  Temp:  (!) 97.3 F (36.3 C)    TempSrc:  Oral    SpO2: 95% 97% (!) 88% 92%  Weight:      Height:        Intake/Output Summary (Last 24 hours) at 08/08/2024 0955 Last data filed at 08/08/2024 0600 Gross per 24 hour  Intake 20 ml  Output 1800 ml  Net -1780 ml   Filed Weights   07/27/24 1822  Weight: 63.8 kg    Examination:  General exam: Appears calm and comfortable, Frail, severely deconditioned. Respiratory system: Decreased breath sounds. Respiratory effort normal.  RR 30 Cardiovascular system: S1 & S2 heard, RRR. No JVD, murmurs,  rubs, gallops or clicks.  Gastrointestinal system: Abdomen is non distended, soft and non tender. Normal bowel sounds heard. Central nervous system: Alert and oriented x 3. No focal neurological deficits. Extremities: No edema, no cyanosis, no clubbing. Skin: No rashes, lesions or ulcers Psychiatry: Judgement and insight appear normal. Mood & affect appropriate.   Data Reviewed: I have personally reviewed following labs and imaging studies  CBC: Recent Labs  Lab 08/02/24 0351 08/03/24 0452 08/06/24 0412  WBC 15.7* 18.7* 16.9*  HGB 12.0* 11.4* 12.2*  HCT 38.6* 35.7* 39.6  MCV 98.2 97.5 98.8  PLT 198 199 213   Basic Metabolic Panel: Recent Labs  Lab 08/02/24 0351 08/03/24 0452 08/06/24 0412  NA 142 142 140  K 4.1 4.1 4.4  CL 104 105 103  CO2 30 32 32  GLUCOSE 90 155* 91  BUN 19 19 18   CREATININE 0.95 0.78 0.76  CALCIUM 9.6 9.4 9.8   GFR: Estimated Creatinine Clearance: 67.6 mL/min (by C-G formula based on SCr of 0.76 mg/dL). Liver Function Tests: Recent Labs  Lab 08/02/24 0351 08/06/24 0412  AST 31 25  ALT 44 43  ALKPHOS 105 90  BILITOT 0.7 0.3  PROT 6.0* 6.1*  ALBUMIN 3.0* 3.1*   No results for input(s): LIPASE, AMYLASE in the last 168 hours. No results for input(s): AMMONIA in the last 168 hours. Coagulation Profile: No results for input(s): INR, PROTIME in the last 168 hours. Cardiac Enzymes: No results for input(s): CKTOTAL, CKMB, CKMBINDEX, TROPONINI in the last 168 hours. BNP (last 3 results) Recent Labs    05/12/24 1726 05/29/24 1826 07/25/24 1314  PROBNP 113.0 113.0 380.0*   HbA1C: No results for input(s): HGBA1C in the last 72 hours. CBG: No results for input(s): GLUCAP in the last 168 hours. Lipid Profile: No results for input(s): CHOL, HDL, LDLCALC, TRIG, CHOLHDL, LDLDIRECT in the last 72 hours. Thyroid  Function Tests: No results for input(s): TSH, T4TOTAL, FREET4, T3FREE, THYROIDAB in the last  72 hours. Anemia Panel: No results for input(s): VITAMINB12, FOLATE, FERRITIN, TIBC, IRON, RETICCTPCT in the last 72 hours. Sepsis Labs: No results for input(s): PROCALCITON, LATICACIDVEN in the last 168 hours.  No results found for this or any previous visit (from the past 240 hours).   Radiology Studies: DG Chest 1 View Result Date: 08/07/2024 EXAM: 1 VIEW(S) XRAY OF THE CHEST 08/07/2024 01:19:00 PM COMPARISON: 08/03/2024 CLINICAL HISTORY: Shortness of breath. FINDINGS: LINES, TUBES AND DEVICES: Right chest port stable in place. LUNGS AND PLEURA: Stable patchy bilateral airspace opacities. Stable small left pleural effusion. Stable elevated left hemidiaphragm. No pneumothorax. HEART AND MEDIASTINUM: Aortic atherosclerosis. No acute abnormality of the cardiac and mediastinal silhouettes. BONES AND SOFT TISSUES: No acute osseous abnormality. IMPRESSION: 1. Stable patchy bilateral airspace opacities. 2. Stable small left pleural effusion. 3. Stable elevated left hemidiaphragm. Electronically signed by: Oneil Devonshire MD 08/07/2024 08:56 PM EST RP Workstation: [id number]   Scheduled Meds:  Chlorhexidine  Gluconate Cloth  6 each Topical Daily   enoxaparin  (LOVENOX ) injection  40 mg Subcutaneous Q24H   feeding supplement  237 mL Oral BID BM   guaiFENesin   600 mg Oral BID   pneumococcal 20-valent conjugate vaccine  0.5 mL Intramuscular Tomorrow-1000   [START ON 08/09/2024] predniSONE   50 mg Oral Q breakfast   Followed by   NOREEN ON 08/16/2024] predniSONE   40 mg Oral Q breakfast   Followed by   NOREEN ON 08/23/2024] predniSONE   30 mg Oral Q breakfast   Followed by   NOREEN ON 08/30/2024] predniSONE   20 mg Oral Q breakfast   Followed by   NOREEN ON 09/06/2024] predniSONE   10 mg Oral Q breakfast   senna  1 tablet Oral BID   sodium chloride  flush  10-40 mL Intracatheter Q12H   sulfamethoxazole -trimethoprim   1 tablet Oral Q M,W,F   Continuous Infusions:   LOS: 14 days    Time spent:  50 mins  Darcel Dawley, MD [facility]   If 7PM-7AM, please contact night-coverage  "

## 2024-08-08 NOTE — Progress Notes (Signed)
 Physical Therapy Treatment Patient Details Name: John [name] MRN: [id number] DOB: [birth year] Today's Date: 08/08/2024   History of Present Illness Mr. Kennard is a 79 yr old male admitted to the hospital with shortness of breath on 07/25/24. He was found to have acute respiratory failure with hypoxia and multi-focal PNA requiring heated high flow O2. PMH: current small cell lung cancer s/p brachytherapy, bladder CA, HTN, aortic regurgitation, arthritis, psoriasis    PT Comments  The Patient is always willing to to mobilize to recliner. Patient only tolerates the transfer from bed to recliner with noted 4/4 dyspnea, RR up to 45, SPo2 does drop down to low 90's with activity. Unable to progress ambulation with  DOE. Patient maintained on  50 L/70% HHF. Continue  progressive mobility as tolerated.   If plan is discharge home, recommend the following: A lot of help with walking and/or transfers;A little help with bathing/dressing/bathroom;Assistance with cooking/housework;Assist for transportation;Help with stairs or ramp for entrance   Can travel by private vehicle     No  Equipment Recommendations  None recommended by PT    Recommendations for Other Services       Precautions / Restrictions Precautions Precautions: Fall Recall of Precautions/Restrictions: Intact Precaution/Restrictions Comments: watch O2 on HHFNC, very SOB with any exertion Restrictions Weight Bearing Restrictions Per Provider Order: No     Mobility  Bed Mobility   Bed Mobility: Supine to Sit     Supine to sit: Contact guard, HOB elevated, Used rails     General bed mobility comments: extra time, rest breaks intermittent to catch breath    Transfers Overall transfer level: Needs assistance Equipment used: Rolling walker (2 wheels) Transfers: Sit to/from Stand Sit to Stand: Min assist, From elevated surface   Step pivot transfers: Min assist       General transfer comment: Once in standing using a RW  for support, Patient steps to recliner , noted shuffling more today.    Ambulation/Gait                   Stairs             Wheelchair Mobility     Tilt Bed    Modified Rankin (Stroke Patients Only)       Balance Overall balance assessment: Needs assistance Sitting-balance support: No upper extremity supported, Feet supported Sitting balance-Leahy Scale: Good Sitting balance - Comments: static sitting-good. dynamic sitting-fair+   Standing balance support: Bilateral upper extremity supported, Reliant on assistive device for balance, During functional activity Standing balance-Leahy Scale: Poor Standing balance comment: Needs RW and CGA                            Communication Communication Communication: No apparent difficulties  Cognition Arousal: Alert Behavior During Therapy: WFL for tasks assessed/performed, Flat affect   PT - Cognitive impairments: No apparent impairments                       PT - Cognition Comments: Eager to mobilize Following commands: Intact      Cueing Cueing Techniques: Verbal cues  Exercises Other Exercises Other Exercises: yellow TB  for supine leg extensions x 10 each.    General Comments        Pertinent Vitals/Pain Pain Assessment Pain Assessment: No/denies pain    Home Living  Prior Function            PT Goals (current goals can now be found in the care plan section) Progress towards PT goals: Progressing toward goals    Frequency    Min 2X/week      PT Plan      Co-evaluation              AM-PAC PT 6 Clicks Mobility   Outcome Measure  Help needed turning from your back to your side while in a flat bed without using bedrails?: None Help needed moving from lying on your back to sitting on the side of a flat bed without using bedrails?: None Help needed moving to and from a bed to a chair (including a wheelchair)?: A  Little Help needed standing up from a chair using your arms (e.g., wheelchair or bedside chair)?: A Little Help needed to walk in hospital room?: Total Help needed climbing 3-5 steps with a railing? : Total 6 Click Score: 16    End of Session Equipment Utilized During Treatment: Gait belt Activity Tolerance: Patient tolerated treatment well Patient left: in chair;with call bell/phone within reach;with chair alarm set Nurse Communication: Mobility status PT Visit Diagnosis: Difficulty in walking, not elsewhere classified (R26.2)     Time: 8882-8857 PT Time Calculation (min) (ACUTE ONLY): 25 min  Charges:    $Therapeutic Exercise: 8-22 mins $Therapeutic Activity: 8-22 mins PT General Charges $$ ACUTE PT VISIT: 1 Visit                     Darice Potters PT Acute Rehabilitation Services Office [phone]    Potters Darice Norris 08/08/2024, 1:19 PM

## 2024-08-08 NOTE — Progress Notes (Signed)
 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 Sherrod with oncology for metastatic small cell lung cancer.  Initially dx 07/2023, s/p palliative RT completed 09/19/23, and palliative chemo with carboplatin  + etoposide  + durvalumab , currently maintained on durvalumab , last dose 12/17.  On CXR 12/30 developed bilateral lobe patchy infiltrates.  Started having some exertional SOB.  Was called in rx doxycycline that he started 1/13 and then presented to ER 1/14 due to worsening SOB.  Denies cough but reports several days ago had speck of blood but nothing since.   Denies fever, chills, cough, chest pain, sick contacts, orthopnea, N/V, dysphagia or choking episodes, or LE swelling/ pain.  Lives alone, uses a cane, and reports is otherwise independent in ADLs except for doesn't drive anymore.  Is not on home oxygen  or uses any inhalers/ nebs at baseline.    Found to be hypoxic on admit at 68% on RA, afebrile, WBC 15, PCT 0.11, SARs, flu, RSV neg, MRSA PCR neg.  CTA PE neg for PE but showing centrilobular and paraseptal emphysema with patchy consolidation and GGO bilaterally, moderate partially loculated left pleural effusion, and s/p radiation changes in upper and lower lobes bilaterally, interval enlargement of mediastinal lymph nodes, and enlarged pulmonic trunk indicative of pulmonary arterial hypertension. Admitted to [facility].  S/p empiric azithro and ctx on 1/14.  Oncology following, changed to [facility] and started on steroids started 1/15.   Pulmonary consulted 1/16 given CT findings and ongoing HHFNC of 60% and 50L.  Pt reports feeling better since admit.  Currently no SOB at rest and continues to deny cough.    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   worked up with cardiology dr hochrein and f/u prn   Nocturia 02/11/2021   Prostate CA (HCC) 2024   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   Wears glasses 02/11/2021   Significant Hospital Events: Including procedures, antibiotic start and stop dates in addition to other pertinent events   1/14 Admit 1/15 steroids added, HHFNC 75%-80%, 50L 1/19 HF 45>60% 1/24 on high flow nasal cannula, 70%, 55 L 1/26 remains on heated high flow 80% FiO2 50 L oxygen  saturations 96 and above 1/28 attempting to wean HHFNC as able today   Antibiotics  Azithromycin  1/14 Zosyn  1/16-1/22 Bactrim  1/23 -  Interim History / Subjective:  No acute events overnight, heated high flow on 55 L and 70%.  He is breathing comfortably and eating breakfast this morning on exam.  States he subjectively feels mildly better than yesterday Objective:   Blood pressure (!) 103/39, pulse 77, temperature (!) 97.3 F (36.3 C), temperature source Oral, resp. rate (!) 29, height 6' (1.829 m), weight 63.8 kg, SpO2 92%.    FiO2 (%):  [70 %-80 %] 75 %   Intake/Output Summary  (Last 24 hours) at 08/08/2024 0947 Last data filed at 08/08/2024 0600 Gross per 24 hour  Intake 20 ml  Output 1800 ml  Net -1780 ml   Filed Weights   07/27/24 1822  Weight: 63.8 kg   Physical Examination: General: Older male, chronically ill-appearing, no acute distress, sitting up in bed eating breakfast HEENT: Mucous membranes are moist, PERRLA, NCAT Neuro: Awake, alert and oriented, follows commands, nonfocal exam CV: S1-S2, no murmur or rub or gallop PULM: Respiratory rate in mid 20s on heated high flow nasal cannula 55 L and 75%.  Unlabored.  Diminished GI: Soft, nondistended Extremities: No significant LE edema noted.  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 Hepatitis B surface antigen negative, QuantiFERON pending. Concern for lymphangitic spread of cancer - Supplemental O2 support to maintain goal SpO2 > 90% - Continue HHFNC, trying to wean this morning as SpO2 allows - Pulmonary hygiene - Prednisone  taper in place - Bactrim  for PJP ppx - F/u Quant Gold-still pending today 1/28 - Aspiration precautions - BZDs PRN - Remains DNR/DNI per his wishes - Palliative medicine has been consulted to help with goals of care  Signature:   Tinnie FORBES Furth, PA-C [facility] 08/08/24 9:47 AM  Please see [email or web address] for pager details.  From 7A-7P if no response, please call [phone] After hours, please call ELink [phone] "

## 2024-08-09 DIAGNOSIS — C799 Secondary malignant neoplasm of unspecified site: Secondary | ICD-10-CM | POA: Diagnosis not present

## 2024-08-09 DIAGNOSIS — J188 Other pneumonia, unspecified organism: Secondary | ICD-10-CM | POA: Diagnosis not present

## 2024-08-09 DIAGNOSIS — J704 Drug-induced interstitial lung disorders, unspecified: Secondary | ICD-10-CM | POA: Diagnosis not present

## 2024-08-09 DIAGNOSIS — Z79899 Other long term (current) drug therapy: Secondary | ICD-10-CM

## 2024-08-09 DIAGNOSIS — C7800 Secondary malignant neoplasm of unspecified lung: Secondary | ICD-10-CM | POA: Diagnosis not present

## 2024-08-09 DIAGNOSIS — C3490 Malignant neoplasm of unspecified part of unspecified bronchus or lung: Secondary | ICD-10-CM

## 2024-08-09 DIAGNOSIS — J9601 Acute respiratory failure with hypoxia: Secondary | ICD-10-CM | POA: Diagnosis not present

## 2024-08-09 LAB — QUANTIFERON-TB GOLD PLUS (RQFGPL)
QuantiFERON Mitogen Value: 0.14 [IU]/mL
QuantiFERON Nil Value: 0.04 [IU]/mL
QuantiFERON TB1 Ag Value: 0.04 [IU]/mL
QuantiFERON TB2 Ag Value: 0.03 [IU]/mL

## 2024-08-09 LAB — CBC
HCT: 36.1 % — ABNORMAL LOW (ref 39.0–52.0)
Hemoglobin: 11.8 g/dL — ABNORMAL LOW (ref 13.0–17.0)
MCH: 31.5 pg (ref 26.0–34.0)
MCHC: 32.7 g/dL (ref 30.0–36.0)
MCV: 96.3 fL (ref 80.0–100.0)
Platelets: 242 10*3/uL (ref 150–400)
RBC: 3.75 MIL/uL — ABNORMAL LOW (ref 4.22–5.81)
RDW: 13.1 % (ref 11.5–15.5)
WBC: 20.2 10*3/uL — ABNORMAL HIGH (ref 4.0–10.5)
nRBC: 0 % (ref 0.0–0.2)

## 2024-08-09 LAB — QUANTIFERON-TB GOLD PLUS: QuantiFERON-TB Gold Plus: UNDETERMINED — AB

## 2024-08-09 LAB — PHOSPHORUS: Phosphorus: 2.4 mg/dL — ABNORMAL LOW (ref 2.5–4.6)

## 2024-08-09 LAB — BASIC METABOLIC PANEL WITH GFR
Anion gap: 7 (ref 5–15)
BUN: 20 mg/dL (ref 8–23)
CO2: 31 mmol/L (ref 22–32)
Calcium: 10 mg/dL (ref 8.9–10.3)
Chloride: 101 mmol/L (ref 98–111)
Creatinine, Ser: 0.81 mg/dL (ref 0.61–1.24)
GFR, Estimated: >60 mL/min
Glucose, Bld: 105 mg/dL — ABNORMAL HIGH (ref 70–99)
Potassium: 4.8 mmol/L (ref 3.5–5.1)
Sodium: 140 mmol/L (ref 135–145)

## 2024-08-09 LAB — MAGNESIUM: Magnesium: 2.1 mg/dL (ref 1.7–2.4)

## 2024-08-09 MED ORDER — OXYCODONE HCL 5 MG PO TABS
7.5000 mg | ORAL_TABLET | ORAL | Status: DC | PRN
  Administered 2024-08-11 – 2024-08-14 (×8): 7.5 mg via ORAL
  Filled 2024-08-09 (×9): qty 2

## 2024-08-09 MED ORDER — OXYCODONE HCL 5 MG PO TABS: 10.0000 mg | ORAL_TABLET | ORAL | Status: DC | PRN

## 2024-08-09 NOTE — Progress Notes (Signed)
 Occupational Therapy Treatment Patient Details Name: John [name] MRN: [id number] DOB: [birth year] Today's Date: 08/09/2024   History of present illness John [name] is a 79 yr old male admitted to the hospital with shortness of breath on 07/25/24. He was found to have acute respiratory failure with hypoxia and multi-focal PNA requiring heated high flow O2. PMH: current small cell lung cancer s/p brachytherapy, bladder CA, HTN, aortic regurgitation, arthritis, psoriasis   OT comments  The pt performed supine to sit with CGA. Once seated EOB, hhe donned a hospital gown with set-up/supervision. Once in standing using RW for support, he was instructed on forwards/backwards and lateral stepping, for which he performed 2 sets, though needing a seated rest break between sets, given increased heart rate up to 120 bpm and increased work of breathing. He continues to require increased supplemental O2 and instruction on pursed lip breathing. He put forth good effort & participation. Continue OT plan of care. Patient will benefit from continued inpatient follow up therapy, <3 hours/day.         If plan is discharge home, recommend the following:  Assistance with cooking/housework;Assist for transportation;A little help with walking and/or transfers;A little help with bathing/dressing/bathroom   Equipment Recommendations  BSC/3in1;Tub/shower bench    Recommendations for Other Services      Precautions / Restrictions Precautions Precautions: Fall Recall of Precautions/Restrictions: Intact Restrictions Weight Bearing Restrictions Per Provider Order: No Other Position/Activity Restrictions: monitor O2 and heart rate       Mobility Bed Mobility Overal bed mobility: Needs Assistance Bed Mobility: Supine to Sit     Supine to sit: Contact guard, HOB elevated, Used rails Sit to supine: Supervision        Transfers Overall transfer level: Needs assistance Equipment used: Rolling walker (2  wheels) Transfers: Sit to/from Stand Sit to Stand: Min assist, From elevated surface           General transfer comment: Once in standing using RW, he was instructed on forwards/backwards and lateral stepping, for which he performed 2 sets, though needing a seated rest break between sets.     Balance     Sitting balance-Leahy Scale: Good         Standing balance comment: CGA to min assist with RW                ADL either performed or assessed with clinical judgement   ADL Overall ADL's : Needs assistance/impaired Eating/Feeding: Independent Eating/Feeding Details (indicate cue type and reason): pt reported no difficulty with self-feeding during hospital stay             Upper Body Dressing : Set up;Supervision/safety;Sitting Upper Body Dressing Details (indicate cue type and reason): The pt sat upright the EOB while doffing a hospital gown then donning a clean one.                         Communication Communication Communication: No apparent difficulties   Cognition Arousal: Alert Behavior During Therapy: WFL for tasks assessed/performed Cognition: No apparent impairments             OT - Cognition Comments: Oriented x4                 Following commands: Intact        Cueing   Cueing Techniques: Verbal cues             Pertinent Vitals/ Pain       Pain  Assessment Pain Assessment: No/denies pain   Frequency  Min 2X/week        Progress Toward Goals  OT Goals(current goals can now be found in the care plan section)     Acute Rehab OT Goals OT Goal Formulation: With patient Time For Goal Achievement: 08/12/24 Potential to Achieve Goals: Good  Plan         AM-PAC OT 6 Clicks Daily Activity     Outcome Measure   Help from another person eating meals?: None Help from another person taking care of personal grooming?: A Little Help from another person toileting, which includes using toliet, bedpan, or  urinal?: A Little Help from another person bathing (including washing, rinsing, drying)?: A Lot Help from another person to put on and taking off regular upper body clothing?: A Little Help from another person to put on and taking off regular lower body clothing?: A Lot 6 Click Score: 17    End of Session Equipment Utilized During Treatment: Oxygen   OT Visit Diagnosis: Muscle weakness (generalized) (M62.81);Unsteadiness on feet (R26.81);Other abnormalities of gait and mobility (R26.89)   Activity Tolerance Patient limited by fatigue   Patient Left in bed;with call bell/phone within reach;with bed alarm set   Nurse Communication Mobility status        Time: 8392-8375 OT Time Calculation (min): 17 min  Charges: OT General Charges $OT Visit: 1 Visit OT Treatments $Therapeutic Activity: 8-22 mins    John [name], OTR/L 08/09/2024, 5:26 PM

## 2024-08-09 NOTE — Progress Notes (Signed)
 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 Sherrod with oncology for metastatic small cell lung cancer.  Initially dx 07/2023, s/p palliative RT completed 09/19/23, and palliative chemo with carboplatin  + etoposide  + durvalumab , currently maintained on durvalumab , last dose 12/17.  On CXR 12/30 developed bilateral lobe patchy infiltrates.  Started having some exertional SOB.  Was called in rx doxycycline that he started 1/13 and then presented to ER 1/14 due to worsening SOB.  Denies cough but reports several days ago had speck of blood but nothing since.   Denies fever, chills, cough, chest pain, sick contacts, orthopnea, N/V, dysphagia or choking episodes, or LE swelling/ pain.  Lives alone, uses a cane, and reports is otherwise independent in ADLs except for doesn't drive anymore.  Is not on home oxygen  or uses any inhalers/ nebs at baseline.    Found to be hypoxic on admit at 68% on RA, afebrile, WBC 15, PCT 0.11, SARs, flu, RSV neg, MRSA PCR neg.  CTA PE neg for PE but showing centrilobular and paraseptal emphysema with patchy consolidation and GGO bilaterally, moderate partially loculated left pleural effusion, and s/p radiation changes in upper and lower lobes bilaterally, interval enlargement of mediastinal lymph nodes, and enlarged pulmonic trunk indicative of pulmonary arterial hypertension. Admitted to [facility].  S/p empiric azithro and ctx on 1/14.  Oncology following, changed to [facility] and started on steroids started 1/15.   Pulmonary consulted 1/16 given CT findings and ongoing HHFNC of 60% and 50L.  Pt reports feeling better since admit.  Currently no SOB at rest and continues to deny cough.    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   worked up with cardiology dr hochrein and f/u prn   Nocturia 02/11/2021   Prostate CA (HCC) 2024   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   Wears glasses 02/11/2021   Significant Hospital Events: Including procedures, antibiotic start and stop dates in addition to other pertinent events   1/14 Admit 1/15 steroids added, HHFNC 75%-80%, 50L 1/19 HF 45>60% 1/24 on high flow nasal cannula, 70%, 55 L 1/26 remains on heated high flow 80% FiO2 50 L oxygen  saturations 96 and above 1/28 attempting to wean HHFNC as able today  1/29 still remains on high flow nasal cannula, decreased to 50 L and 70%  Antibiotics  Azithromycin  1/14 Zosyn  1/16-1/22 Bactrim  1/23 -  Interim History / Subjective:  No overnight events Some decompensation with his sats Flow and FiO2 increased Objective:   Blood pressure (!) 119/44, pulse 73, temperature 97.6 F (36.4 C), temperature source Axillary, resp. rate 17, height 6' (1.829 m), weight 63.8 kg, SpO2 99%.    FiO2 (%):  [50 %-90 %] 80 %   Intake/Output Summary (Last 24 hours) at 08/09/2024 0827 Last  data filed at 08/08/2024 2131 Gross per 24 hour  Intake 20 ml  Output 650 ml  Net -630 ml   Filed Weights   07/27/24 1822  Weight: 63.8 kg   Physical Examination: General: Elderly, frail HEENT: Dry mucous membranes Neuro: Nonfocal, following commands CV: S1-S2 appreciated PULM: Poor air movement bilaterally GI: Soft, nondistended Extremities: No significant LE edema noted.  I reviewed last 24 h vitals and pain scores, last 48 h intake and output, last 24 h labs and trends, and last 24 h imaging results. BUN 20, creatinine 0.1  White count 20.2, hematocrit 36, platelet count 242  QuantiFERON gold pending Hepatitis B surface antigen nonreactive  Resolved Problem List:   Assessment and Plan:   Acute hypoxemic respiratory failure Metastatic small cell lung cancer Durvalumab  induced pneumonitis - Received radiation treatment prior to durvalumab  which increases the risk with pneumonitis  Concern for lymphangitic spread of cancer  Continue high flow nasal cannula - Wean as tolerated - Maintain sats goal greater than 89  Continue pulmonary hygiene  Tapering doses of prednisone  in place Bactrim  for PCP prophylaxis  As needed benzodiazepine  Remains DNR/DNI  Will follow  Jennet Epley, MD [facility] Pager: See Amion   "

## 2024-08-09 NOTE — Progress Notes (Signed)
 " Daily Progress Note   Patient Name: John [name]       Date: 08/09/2024 DOB: 03/18/1946  Age: 79 y.o. MRN#: [id number] Attending Physician: Leotis Bogus, MD Primary Care Physician: Practice, [facility] Family Admit Date: 07/25/2024 Length of Stay: 15 days  Reason for Follow-up: Establishing goals of care, Non pain symptom management, and Pain control  Subjective:   CC: Patient feeling his breathing has slightly improved.  Following up regarding goals of care.  Reviewed oncology documentation from yesterday.  Dr. Gatha noting that patient has only received first-line therapies for his small cell lung cancer and would be a candidate for further cancer directed therapies though would need to recover from this multifocal pneumonia and have his respiratory status improved to be a candidate for those therapies.  Patient does remain DNR/DNI.  Reviewed PCCM note from today noting patient having some decompensation with his oxygen  saturations.  Had tried to wean oxygen  overnight though with the decompensations required increases in support.  At time of EMR review patient receiving HHFNC 60 L/min with an FiO2 of 80% which is increased from yesterday.  At time of EMR review patient has received as needed oxycodone  5 mg x 1 dose.  Presented to bedside to see patient.  Patient laying comfortably in bed.  Patient feels that his breathing has slightly improved.  Inquired about use of oxycodone  for shortness of breath.  Patient did take the as needed oxycodone  yesterday though did not feel like it helped much.  Noted would slightly increase the dose to see if this helps more with patient's work of breathing.  Taking increases slowly with patient's hesitation to take too much medication.  Patient agreeing with slight increase of oxycodone  to see if it helps with his shortness of breath and noted would likely need to further increase based on response.  With permission, also able to discuss patient's ACP  documentation that was present in room.  Patient has not completed documentation at this time.  Patient states that he is not married and does not have any children.  Patient has 2 adult sisters who he would want to help make medical decisions on his behalf.  His sisters are [name] and [name].  Acknowledged patient's desire to have both his sisters involved if he was unable to speak for himself in terms of medical decisions.  Spent time providing emotional support via active listening.  All questions answered at that time.  Noted palliative medicine team to continue to follow along with patient's medical journey.  Discussed care with RN after visit to provide medical updates and coordinate care.  Objective:   Vital Signs:  BP (!) 112/39   Pulse 79   Temp (!) 97.2 F (36.2 C) (Axillary)   Resp 16   Ht 6' (1.829 m)   Wt 63.8 kg   SpO2 91%   BMI 19.08 kg/m   Physical Exam: General: NAD, alert, chronically ill-appearing, cachectic, frail Cardiovascular: RRR Respiratory: increased work of breathing noted, on HHFNC 60 L/min with FiO2 80 % Abdomen: not distended Neuro: A&Ox4, following commands easily Psych: appropriately answers all questions  Assessment & Plan:   Assessment: Patient is a 79 year old male with a past medical history of tobacco use (quit 20-25 years ago), prostate cancer in 2013, urothelial carcinoma 2022, left kidney lesion status post SBRT 04/2024, hypertension, aortic regurgitation, type I second-degree AV block, cataracts, and metastatic small cell lung cancer who was admitted on 07/25/2024 for management of worsening shortness  of breath and cough.  During hospitalization patient has received management for acute hypoxic respiratory failure in the setting of concern for durvalumab  induced pneumonitis and possible concern for lymphangitic spread of cancer.  PCCM and oncology consulted for recommendations.  Palliative medicine team consulted to assist with goals  of care and symptom management.  Recommendations/Plan: # Complex medical decision making/goals of care:  - Patient currently continuing with appropriate medical interventions.  Oncology team has noted that if patient improves from his multifocal pneumonia and from a respiratory status, he would be a candidate for further cancer directed therapies.  Appropriately remains DNR/DNI.  Palliative medicine team continuing to engage in conversations as able and appropriate.  - Patient stated if he was unable to make medical decisions for himself, he would want his 2 sisters, [name] and [name], to make medical decisions on his behalf.  -  Code Status: Limited: Do not attempt resuscitation (DNR) -DNR-LIMITED -Do Not Intubate/DNI   # Symptom management: Patient is receiving these palliative interventions for symptom management with an intent to improve quality of life.   - Shortness of breath, acute in setting of acute hypoxic respiratory failure with concern for durvalumab  induced pneumonitis or possible lymphangitic spread of cancer   - Increase oral oxycodone  to 7.5 mg every 4 hours as needed  # Discharge Planning: To Be Determined  Discussed with: Patient, RN  Thank you for allowing the palliative care team to participate in the care [name].  Tinnie Radar, DO Palliative Care Provider PMT # [phone]  If patient remains symptomatic despite maximum doses, please call PMT at [phone] between 0700 and 1900. Outside of these hours, please call attending, as PMT does not have night coverage.  Billing based on MDM: Moderate  Problems addressed: 1 acute illness with symptomatic symptoms  Risks: Prescription drug management  "

## 2024-08-09 NOTE — Progress Notes (Signed)
 " PROGRESS NOTE    John [name]  FMW:[id number] DOB: Jun 06, 1946 DOA: 07/25/2024 PCP: [facility]   Brief Narrative:  This 79 yrs old Male with PMH significant for small cell lung cancer, prostate cancer s/p brachytherapy, bladder cancer,  hypertension, aortic regurgitation, who Lives at home alone,  ambulates with a cane. 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 Sherrod.  Underwent radiation in the past.  Received last dose of immunotherapy with durvalumab  on 12/16. -12/30, he had a chest x-ray done which resulted 10 days later on 1/10 to show bilateral pneumonia.  Patient continued to have symptoms at that time but then suddenly started having shortness of breath, cough and decreased exercise tolerance.SABRA  He does not have any supplemental oxygen  at home. -1/11, He called Dr. Jeannett office and was called in a prescription for doxycycline which he started on 1/13. -1/14, patient felt significantly short of breath and hence presented to the ED.  He was tachypneic,  tachycardic and hypoxic at 68% on room air requiring 15 L of oxygen  on admission.  Respiratory virus panel was negative. -CT angio chest was negative for pulm embolism but showed patchy consolidation and ground glass bilaterally suggestive of multifocal pneumonia.Partially loculated moderate left pleural effusion. Enlarged pulmonic trunk, indicative of pulmonary arterial hypertension. -Subsequently his oxygen  requirement worsened to 60 L, patient received infliximab  08/04/2024.   -Currently on 50 L he reports he feels a little better than yesterday. Patient remains in stepdown unit closely followed by pulmonary.     Assessment & Plan:   Principal Problem:   Multifocal pneumonia Active Problems:   History of lung cancer   Shortness of breath   Sepsis (HCC)   Palliative care by specialist   General weakness    Need for emotional support   Acute hypoxic respiratory failure (HCC)   Pneumonitis   Counseling and coordination of care  Acute hypoxic respiratory failure: Chemotherapy induced pneumonitis: It could be Pneumonitis from recent chemotherapy vs pneumonia in the setting of small cell lung cancer vs lymphangitic spread CT of the chest did not reveal any pulmonary embolism but showed multifocal infiltrates consistent with consolidation.   Patient admitted with gradually worsening shortness of breath.  O2 Saturation was mid 60s on room air in the ER.   Initially was given Rocephin  and azithromycin  then changed to Zosyn .   Prior hospitalist discussed with Dr. Sherrod recommended prednisone .  Oxygen  requirement still remains high at  50 L heated high flow nasal cannula, 80% FiO2 satting 98%.  On Bactrim  for PJP prophylaxis. He received infliximab  on 08/04/2024 for pneumonitis secondary to chemotherapy.  He was initially given Solu-Medrol  changed to prednisone  tapering dose. Chest x-ray from 21st showed multifocal bilateral airspace disease with right apical sparing,  small left pleural effusion and mild elevation of the left hemidiaphragm.   Follow-up chest x-ray stable bilateral infiltrates,  small left pleural effusion 1/23 He finished a course of Zosyn . Chest xray 1/27 showed stable patchy bilateral airspace opacities,  stable elevated left diaphragm. Palliative care following.   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 Sherrod.  Underwent radiation in the past.  Received last dose of immunotherapy with durvalumab  12/16. Oncology following.   Hypertension: H/o moderate aortic regurgitation: CT angio showed enlarged pulmonary trunk indicating pulmonary artery  hypertension. Last echo from 2018 had shown moderate AI.  Repeat echo showed mild to moderate AI PTA meds- amlodipine 10 mg daily.   Currently on hold.  Blood pressure continue to be soft 111/46.   H/o prostate cancer s/p brachytherapy H/o bladder cancer: Follow up Oncology  Leucocytosis : Likely due to steroids.   DVT prophylaxis: Lovenox  Code Status: DNR Family Communication: No family at bedside Disposition Plan:   Status is: Inpatient Remains inpatient appropriate because: Severity of illness.  He is still requiring 50% of supplemental oxygen  via heated high flow.   He is not medically ready for discharge.  Consultants:  PCCM  Procedures:  Antimicrobials: Anti-infectives (From admission, onward)    Start     Dose/Rate Route Frequency Ordered Stop   08/03/24 1000  sulfamethoxazole -trimethoprim  (BACTRIM  DS) 800-160 MG per tablet 1 tablet        1 tablet Oral Every M-W-F 08/02/24 1206     07/27/24 1400  piperacillin -tazobactam (ZOSYN ) IVPB 3.375 g        3.375 g 12.5 mL/hr over 240 Minutes Intravenous Every 8 hours 07/27/24 1308 08/03/24 1113   07/25/24 1330  cefTRIAXone  (ROCEPHIN ) 2 g in sodium chloride  0.9 % 100 mL IVPB        2 g 200 mL/hr over 30 Minutes Intravenous Once 07/25/24 1315 07/25/24 1409   07/25/24 1330  azithromycin  (ZITHROMAX ) tablet 500 mg        500 mg Oral  Once 07/25/24 1315 07/25/24 1416       Subjective: Patient was seen and examined at bedside.  Overnight events noted. Patient seems comfortable but still requiring HHFNC @50  L of supplemental oxygen . Overnight weaned down to 30 L but has desaturated, placed back of 50L.  Objective: Vitals:   08/09/24 0722 08/09/24 0748 08/09/24 0758 08/09/24 0800  BP:    (!) 119/44  Pulse:   74 73  Resp:   (!) 21 17  Temp:  97.6 F (36.4 C)    TempSrc:  Axillary    SpO2: 90%  98% 99%  Weight:      Height:        Intake/Output Summary (Last 24 hours) at 08/09/2024 0955 Last data filed at 08/08/2024 2131 Gross per 24 hour  Intake 20 ml  Output 650 ml  Net -630 ml   Filed Weights   07/27/24 1822  Weight: 63.8 kg     Examination:  General exam: Appears calm and comfortable, Frail, severely deconditioned. Respiratory system: Decreased breath sounds. Respiratory effort normal.  RR 28 Cardiovascular system: S1 & S2 heard, RRR. No JVD, murmurs, rubs, gallops or clicks.  Gastrointestinal system: Abdomen is non distended, soft and non tender. Normal bowel sounds heard. Central nervous system: Alert and oriented x 3. No focal neurological deficits. Extremities: No edema, no cyanosis, no clubbing. Skin: No rashes, lesions or ulcers Psychiatry: Judgement and insight appear normal. Mood & affect appropriate.   Data Reviewed: I have personally reviewed following labs and imaging studies  CBC: Recent Labs  Lab 08/03/24 0452 08/06/24 0412 08/09/24 0355  WBC 18.7* 16.9* 20.2*  HGB 11.4* 12.2* 11.8*  HCT 35.7* 39.6 36.1*  MCV 97.5 98.8 96.3  PLT 199 213 242   Basic Metabolic Panel: Recent Labs  Lab 08/03/24 0452 08/06/24 0412 08/09/24 0355  NA 142 140 140  K 4.1 4.4 4.8  CL 105 103 101  CO2 32 32 31  GLUCOSE 155* 91 105*  BUN 19 18 20   CREATININE 0.78 0.76 0.81  CALCIUM 9.4 9.8 10.0  MG  --   --  2.1  PHOS  --   --  2.4*   GFR: Estimated Creatinine Clearance: 66.7 mL/min (by C-G formula based on SCr of 0.81 mg/dL). Liver Function Tests: Recent Labs  Lab 08/06/24 0412  AST 25  ALT 43  ALKPHOS 90  BILITOT 0.3  PROT 6.1*  ALBUMIN 3.1*   No results for input(s): LIPASE, AMYLASE in the last 168 hours. No results for input(s): AMMONIA in the last 168 hours. Coagulation Profile: No results for input(s): INR, PROTIME in the last 168 hours. Cardiac Enzymes: No results for input(s): CKTOTAL, CKMB, CKMBINDEX, TROPONINI in the last 168 hours. BNP (last 3 results) Recent Labs    05/12/24 1726 05/29/24 1826 07/25/24 1314  PROBNP 113.0 113.0 380.0*   HbA1C: No results for input(s): HGBA1C in the last 72 hours. CBG: No results for input(s): GLUCAP in the last  168 hours. Lipid Profile: No results for input(s): CHOL, HDL, LDLCALC, TRIG, CHOLHDL, LDLDIRECT in the last 72 hours. Thyroid  Function Tests: No results for input(s): TSH, T4TOTAL, FREET4, T3FREE, THYROIDAB in the last 72 hours. Anemia Panel: No results for input(s): VITAMINB12, FOLATE, FERRITIN, TIBC, IRON, RETICCTPCT in the last 72 hours. Sepsis Labs: No results for input(s): PROCALCITON, LATICACIDVEN in the last 168 hours.  No results found for this or any previous visit (from the past 240 hours).   Radiology Studies: DG Chest 1 View Result Date: 08/07/2024 EXAM: 1 VIEW(S) XRAY OF THE CHEST 08/07/2024 01:19:00 PM COMPARISON: 08/03/2024 CLINICAL HISTORY: Shortness of breath. FINDINGS: LINES, TUBES AND DEVICES: Right chest port stable in place. LUNGS AND PLEURA: Stable patchy bilateral airspace opacities. Stable small left pleural effusion. Stable elevated left hemidiaphragm. No pneumothorax. HEART AND MEDIASTINUM: Aortic atherosclerosis. No acute abnormality of the cardiac and mediastinal silhouettes. BONES AND SOFT TISSUES: No acute osseous abnormality. IMPRESSION: 1. Stable patchy bilateral airspace opacities. 2. Stable small left pleural effusion. 3. Stable elevated left hemidiaphragm. Electronically signed by: Oneil Devonshire MD 08/07/2024 08:56 PM EST RP Workstation: [id number]   Scheduled Meds:  Chlorhexidine  Gluconate Cloth  6 each Topical Daily   enoxaparin  (LOVENOX ) injection  40 mg Subcutaneous Q24H   feeding supplement  237 mL Oral BID BM   guaiFENesin   600 mg Oral BID   pneumococcal 20-valent conjugate vaccine  0.5 mL Intramuscular Tomorrow-1000   predniSONE   50 mg Oral Q breakfast   Followed by   NOREEN ON 08/16/2024] predniSONE   40 mg Oral Q breakfast   Followed by   NOREEN ON 08/23/2024] predniSONE   30 mg Oral Q breakfast   Followed by   NOREEN ON 08/30/2024] predniSONE   20 mg Oral Q breakfast   Followed by   NOREEN ON 09/06/2024] predniSONE    10 mg Oral Q breakfast   senna  1 tablet Oral BID   sodium chloride  flush  10-40 mL Intracatheter Q12H   sulfamethoxazole -trimethoprim   1 tablet Oral Q M,W,F   Continuous Infusions:   LOS: 15 days    Time spent: 50 mins  Darcel Dawley, MD [facility]   If 7PM-7AM, please contact night-coverage  "

## 2024-08-09 NOTE — Plan of Care (Signed)
  Problem: Education: Goal: Knowledge of General Education information will improve Description: Including pain rating scale, medication(s)/side effects and non-pharmacologic comfort measures Outcome: Progressing   Problem: Nutrition: Goal: Adequate nutrition will be maintained Outcome: Progressing   Problem: Coping: Goal: Level of anxiety will decrease Outcome: Progressing   Problem: Elimination: Goal: Will not experience complications related to bowel motility Outcome: Progressing Goal: Will not experience complications related to urinary retention Outcome: Progressing   Problem: Pain Managment: Goal: General experience of comfort will improve and/or be controlled Outcome: Progressing   Problem: Safety: Goal: Ability to remain free from injury will improve Outcome: Progressing   Problem: Skin Integrity: Goal: Risk for impaired skin integrity will decrease Outcome: Progressing

## 2024-08-10 DIAGNOSIS — T451X5A Adverse effect of antineoplastic and immunosuppressive drugs, initial encounter: Secondary | ICD-10-CM | POA: Diagnosis not present

## 2024-08-10 DIAGNOSIS — C799 Secondary malignant neoplasm of unspecified site: Secondary | ICD-10-CM | POA: Diagnosis not present

## 2024-08-10 DIAGNOSIS — J9601 Acute respiratory failure with hypoxia: Secondary | ICD-10-CM | POA: Diagnosis not present

## 2024-08-10 DIAGNOSIS — C7800 Secondary malignant neoplasm of unspecified lung: Secondary | ICD-10-CM | POA: Diagnosis not present

## 2024-08-10 DIAGNOSIS — J188 Other pneumonia, unspecified organism: Secondary | ICD-10-CM | POA: Diagnosis not present

## 2024-08-10 DIAGNOSIS — J704 Drug-induced interstitial lung disorders, unspecified: Secondary | ICD-10-CM | POA: Diagnosis not present

## 2024-08-10 LAB — GLUCOSE, CAPILLARY: Glucose-Capillary: 184 mg/dL — ABNORMAL HIGH (ref 70–99)

## 2024-08-10 LAB — CBC
HCT: 36.5 % — ABNORMAL LOW (ref 39.0–52.0)
Hemoglobin: 11.5 g/dL — ABNORMAL LOW (ref 13.0–17.0)
MCH: 30.6 pg (ref 26.0–34.0)
MCHC: 31.5 g/dL (ref 30.0–36.0)
MCV: 97.1 fL (ref 80.0–100.0)
Platelets: 236 10*3/uL (ref 150–400)
RBC: 3.76 MIL/uL — ABNORMAL LOW (ref 4.22–5.81)
RDW: 13.1 % (ref 11.5–15.5)
WBC: 17.4 10*3/uL — ABNORMAL HIGH (ref 4.0–10.5)
nRBC: 0 % (ref 0.0–0.2)

## 2024-08-10 NOTE — Progress Notes (Signed)
 "  NAME:  John [name], MRN:  [id number], DOB:  July 03, 1946, LOS: 16 ADMISSION DATE:  07/25/2024, CONSULTATION DATE: 07/27/2024 REFERRING MD: [facility], CHIEF COMPLAINT: Acute on chronic hypoxic respiratory failure  History of Present Illness:  6 yoM with PMH significant for former smoker (quit 20-36yrs ago), metastatic small cell lung cancer., prostate cancer 2013 and urothelial carcinoma 2022, left kidney lesion s/p SBRT 04/2024, HTN, aortic regurgitation, type 1 second degree AV block, cataracts    Pt is followed by Dr. Sherrod with oncology for metastatic small cell lung cancer.  Initially dx 07/2023, s/p palliative RT completed 09/19/23, and palliative chemo with carboplatin  + etoposide  + durvalumab , currently maintained on durvalumab , last dose 12/17.  On CXR 12/30 developed bilateral lobe patchy infiltrates.  Started having some exertional SOB.  Was called in rx doxycycline that he started 1/13 and then presented to ER 1/14 due to worsening SOB.  Denies cough but reports several days ago had speck of blood but nothing since.   Denies fever, chills, cough, chest pain, sick contacts, orthopnea, N/V, dysphagia or choking episodes, or LE swelling/ pain.  Lives alone, uses a cane, and reports is otherwise independent in ADLs except for doesn't drive anymore.  Is not on home oxygen  or uses any inhalers/ nebs at baseline.    Found to be hypoxic on admit at 68% on RA, afebrile, WBC 15, PCT 0.11, SARs, flu, RSV neg, MRSA PCR neg.  CTA PE neg for PE but showing centrilobular and paraseptal emphysema with patchy consolidation and GGO bilaterally, moderate partially loculated left pleural effusion, and s/p radiation changes in upper and lower lobes bilaterally, interval enlargement of mediastinal lymph nodes, and enlarged pulmonic trunk indicative of pulmonary arterial hypertension. Admitted to [facility].  S/p empiric azithro and ctx on 1/14.  Oncology following, changed to [facility] and started on steroids started 1/15.   Pulmonary consulted 1/16 given CT findings and ongoing HHFNC of 60% and 50L.  Pt reports feeling better since admit.  Currently no SOB at rest and continues to deny cough.    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   worked up with cardiology dr hochrein and f/u prn   Nocturia 02/11/2021   Prostate CA (HCC) 2024   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   Wears glasses 02/11/2021   Significant Hospital Events: Including procedures, antibiotic start and stop dates in addition to other pertinent events   1/14 Admit 1/15 steroids added, HHFNC 75%-80%, 50L 1/19 HF 45>60% 1/24 on high flow nasal cannula, 70%, 55 L 1/26 remains on heated high flow 80% FiO2 50 L oxygen  saturations 96 and above 1/28 attempting to wean HHFNC as able today  1/29 still remains on high flow nasal cannula, decreased to 50 L and 70% 1/30  FiO2 remains 50% with flow fluctuating between 50 and 60  Antibiotics  Azithromycin  1/14 Zosyn  1/16-1/22 Bactrim  1/23 -  Interim History / Subjective:  No issues overnight, resting comfortably in bed this morning on assessment  Objective:   Blood pressure (!) 134/56, pulse 98, temperature (!) 97.2 F (36.2 C), temperature source Oral, resp. rate (!) 30, height 6' (1.829 m), weight 63.8 kg, SpO2 (!) 87%.    FiO2 (%):  [50 %-70 %] 50 %    Intake/Output Summary (Last 24 hours) at 08/10/2024 1231 Last data filed at 08/10/2024 1200 Gross per 24 hour  Intake 460 ml  Output 1500 ml  Net -1040 ml   Filed Weights   07/27/24 1822  Weight: 63.8 kg   Physical Examination: General: Acute on chronic ill-appearing thin elderly male sitting up in bed in no acute distress HEENT: [place]/AT, HHFNC nasal cannula in place, MM pink/moist, PERRL,  Neuro: Alert and oriented x 3, nonfocal CV: s1s2 regular rate and rhythm, no murmur, rubs, or gallops,  PULM: Slightly diminished to auscultation bilaterally, no increased work of breathing, no added breath sounds GI: soft, bowel sounds active in all 4 quadrants, non-tender, non-distended, tolerating oral diet Extremities: warm/dry, no edema  Skin: no rashes or lesions  Resolved Problem List:   Assessment and Plan:   Acute hypoxemic respiratory failure Metastatic small cell lung cancer Durvalumab  induced pneumonitis - Received radiation treatment prior to durvalumab  which increases the risk with pneumonitis Concern for lymphangitic spread of cancer P: Continue supplemental oxygen , wean as able SpO2 goal greater than 90 Prednisone  taper in place Prophylactic Bactrim  for PJP prophylaxis in place QuantiFERON gold resulted as indeterminate, will discuss with attending Aspiration precautions Mobilize as able As needed benzodiazepines  Patient remained stable on current regiment, PCCM will plan to see again 2/2, please call if needed sooner  Signature  Kamon Fahr D. Harris, NP-C [facility] Personal contact information can be found on Amion  If no contact or response made please call [phone] 08/10/2024, 12:36 PM      "

## 2024-08-10 NOTE — Progress Notes (Signed)
 Physical Therapy Treatment Patient Details Name: John [name] MRN: [id number] DOB: 09-14-1945 Today's Date: 08/10/2024   History of Present Illness John [name] is a 79 yr old male admitted to the hospital with shortness of breath on 07/25/24. He was found to have acute respiratory failure with hypoxia and multi-focal PNA requiring heated high flow O2. PMH: current small cell lung cancer s/p brachytherapy, bladder CA, HTN, aortic regurgitation, arthritis, psoriasis    PT Comments  Pt sat EOB x 8 minutes and performed seated BLE exercises. SpO2 87-89% on 50L Heated high flow O2. Sitting tolerance limited by 4/4 dyspnea and by fatigue. Transfers and ambulation deferred 2* 4/4 dyspnea. Pt puts forth good effort.     If plan is discharge home, recommend the following: A lot of help with walking and/or transfers;A little help with bathing/dressing/bathroom;Assistance with cooking/housework;Assist for transportation;Help with stairs or ramp for entrance   Can travel by private vehicle     No  Equipment Recommendations  None recommended by PT    Recommendations for Other Services       Precautions / Restrictions Precautions Precautions: Fall Recall of Precautions/Restrictions: Intact Precaution/Restrictions Comments: watch O2 on HHFNC, very SOB with any exertion Restrictions Weight Bearing Restrictions Per Provider Order: No Other Position/Activity Restrictions: monitor O2 and heart rate     Mobility  Bed Mobility   Bed Mobility: Supine to Sit, Sit to Supine     Supine to sit: Contact guard, HOB elevated, Used rails Sit to supine: Min assist   General bed mobility comments: extra time, rest breaks intermittent to catch breath, min A for LEs into bed; Pt sat EOB x 8 min    Transfers                   General transfer comment: deferred 2* SOB    Ambulation/Gait                   Stairs             Wheelchair Mobility     Tilt Bed    Modified Rankin  (Stroke Patients Only)       Balance   Sitting-balance support: No upper extremity supported, Feet supported Sitting balance-Leahy Scale: Good                                      Communication Communication Communication: No apparent difficulties  Cognition Arousal: Alert Behavior During Therapy: WFL for tasks assessed/performed   PT - Cognitive impairments: No apparent impairments                         Following commands: Intact      Cueing Cueing Techniques: Verbal cues  Exercises General Exercises - Lower Extremity Ankle Circles/Pumps: AROM, Both, Seated, 15 reps Long Arc Quad: AROM, Both, Seated, 10 reps Hip Flexion/Marching: AROM, Both, 10 reps, Seated    General Comments        Pertinent Vitals/Pain Pain Assessment Pain Score: 0-No pain    Home Living                          Prior Function            PT Goals (current goals can now be found in the care plan section) Acute Rehab PT Goals Patient Stated Goal: Regain IND  PT Goal Formulation: With patient Time For Goal Achievement: 08/12/24 Potential to Achieve Goals: Fair Progress towards PT goals: Progressing toward goals    Frequency    Min 2X/week      PT Plan      Co-evaluation              AM-PAC PT 6 Clicks Mobility   Outcome Measure  Help needed turning from your back to your side while in a flat bed without using bedrails?: None Help needed moving from lying on your back to sitting on the side of a flat bed without using bedrails?: None Help needed moving to and from a bed to a chair (including a wheelchair)?: A Little Help needed standing up from a chair using your arms (e.g., wheelchair or bedside chair)?: A Little Help needed to walk in hospital room?: A Lot Help needed climbing 3-5 steps with a railing? : Total 6 Click Score: 17    End of Session   Activity Tolerance: Patient limited by fatigue;Patient tolerated treatment  well Patient left: in bed;with bed alarm set;with call bell/phone within reach Nurse Communication: Mobility status PT Visit Diagnosis: Difficulty in walking, not elsewhere classified (R26.2)     Time: 8473-8458 PT Time Calculation (min) (ACUTE ONLY): 15 min  Charges:    $Therapeutic Exercise: 8-22 mins PT General Charges $$ ACUTE PT VISIT: 1 Visit                    [name] PT 08/10/2024  Acute Rehabilitation Services  Office [phone]

## 2024-08-10 NOTE — Progress Notes (Signed)
 Spiritual Care is following John [name] and I got to see him for a few minutes this afternoon.  He was in good spirits and was doing well for now.

## 2024-08-10 NOTE — Plan of Care (Signed)
  Problem: Clinical Measurements: Goal: Will remain free from infection Outcome: Progressing Goal: Cardiovascular complication will be avoided Outcome: Progressing   Problem: Coping: Goal: Level of anxiety will decrease Outcome: Progressing   Problem: Elimination: Goal: Will not experience complications related to urinary retention Outcome: Progressing

## 2024-08-10 NOTE — Progress Notes (Signed)
 " PROGRESS NOTE    John [name]  FMW:[id number] DOB: 19-Jun-1946 DOA: 07/25/2024 PCP: [facility]   Brief Narrative:  This 79 yrs old Male with PMH significant for small cell lung cancer, prostate cancer s/p brachytherapy, bladder cancer,  hypertension, aortic regurgitation, who Lives at home alone,  ambulates with a cane. 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 Sherrod.  Underwent radiation in the past.  Received last dose of immunotherapy with durvalumab  on 12/16. -12/30, he had a chest x-ray done which resulted 10 days later on 1/10 to show bilateral pneumonia.  Patient continued to have symptoms at that time but then suddenly started having shortness of breath, cough and decreased exercise tolerance.SABRA  He does not have any supplemental oxygen  at home. -1/11, He called Dr. Jeannett office and was called in a prescription for doxycycline which he started on 1/13. -1/14, patient felt significantly short of breath and hence presented to the ED.  He was tachypneic,  tachycardic and hypoxic at 68% on room air requiring 15 L of oxygen  on admission.  Respiratory virus panel was negative. -CT angio chest was negative for pulm embolism but showed patchy consolidation and ground glass bilaterally suggestive of multifocal pneumonia.Partially loculated moderate left pleural effusion. Enlarged pulmonic trunk, indicative of pulmonary arterial hypertension. -Subsequently his oxygen  requirement worsened to 60 L, patient received infliximab  08/04/2024.   -Currently on 50 L he reports he feels a little better than yesterday. Patient remains in stepdown unit closely followed by pulmonary.     Assessment & Plan:   Principal Problem:   Multifocal pneumonia Active Problems:   History of lung cancer   Shortness of breath   Sepsis (HCC)   Palliative care by specialist   General weakness    Need for emotional support   Acute hypoxic respiratory failure (HCC)   Pneumonitis   Counseling and coordination of care   Medication management   Small cell carcinoma of lung (HCC)  Acute hypoxic respiratory failure: Chemotherapy induced pneumonitis: It could be Pneumonitis from recent chemotherapy vs pneumonia in the setting of small cell lung cancer vs lymphangitic spread. CT of the chest did not reveal any pulmonary embolism but showed multifocal infiltrates consistent with consolidation.   Patient admitted with gradually worsening shortness of breath.  O2 Saturation was mid 60s on room air in the ER.   Initially was given Rocephin  and azithromycin  then changed to Zosyn .   Prior hospitalist discussed with Dr. Sherrod recommended prednisone  taper.  Oxygen  requirement still remains high at 60 L heated high flow nasal cannula, 50% FiO2 satting 98%.  On Bactrim  for PJP prophylaxis. He received infliximab  on 08/04/2024 for pneumonitis secondary to chemotherapy.  He was initially given Solu-Medrol  changed to prednisone  tapering dose. Chest x-ray from [facility] showed multifocal bilateral airspace disease with right apical sparing,  small left pleural effusion and mild elevation of the left hemidiaphragm.   Follow-up chest x-ray stable bilateral infiltrates,  small left pleural effusion 1/23 He finished a course of Zosyn . Chest xray 1/27 showed stable patchy bilateral airspace opacities,  stable elevated left diaphragm. Palliative care following.  PCCM following, wants to continue current plan. Wean as tolerated.   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 Sherrod.  Underwent radiation in the past.  Received last dose  of immunotherapy with durvalumab  12/16. Oncology following.   Hypertension: H/o moderate aortic regurgitation: CT angio showed enlarged pulmonary trunk indicating pulmonary  artery hypertension. Last echo from 2018 had shown moderate AI.  Repeat echo showed mild to moderate AI PTA meds- amlodipine 10 mg daily.  Currently on hold.  Blood pressure continue to be soft 111/46.   H/o prostate cancer s/p brachytherapy H/o bladder cancer: Follow up Oncology  Leucocytosis : Likely due to steroids. Improving.   DVT prophylaxis: Lovenox  Code Status: DNR Family Communication: Nephew at bed side. Disposition Plan:   Status is: Inpatient Remains inpatient appropriate because: Severity of illness.  He is still requiring 50% of supplemental oxygen  via heated high flow.  He is not medically ready for discharge.  Palliative care following to discuss goals of care.  Consultants:  PCCM  Procedures:  Antimicrobials: Anti-infectives (From admission, onward)    Start     Dose/Rate Route Frequency Ordered Stop   08/03/24 1000  sulfamethoxazole -trimethoprim  (BACTRIM  DS) 800-160 MG per tablet 1 tablet        1 tablet Oral Every M-W-F 08/02/24 1206     07/27/24 1400  piperacillin -tazobactam (ZOSYN ) IVPB 3.375 g        3.375 g 12.5 mL/hr over 240 Minutes Intravenous Every 8 hours 07/27/24 1308 08/03/24 1113   07/25/24 1330  cefTRIAXone  (ROCEPHIN ) 2 g in sodium chloride  0.9 % 100 mL IVPB        2 g 200 mL/hr over 30 Minutes Intravenous Once 07/25/24 1315 07/25/24 1409   07/25/24 1330  azithromycin  (ZITHROMAX ) tablet 500 mg        500 mg Oral  Once 07/25/24 1315 07/25/24 1416       Subjective: Patient was seen and examined at bedside.  Overnight events noted. Patient seems comfortable but still requiring HHFNC @60  L of supplemental oxygen . Overnight weaned down to 30 L but has desaturated, placed back of 60L.  Objective: Vitals:   08/10/24 1000 08/10/24 1100 08/10/24 1123 08/10/24 1200  BP: (!) 121/45 (!) 123/50  (!) 134/56  Pulse: 95 91 (!) 104 98  Resp: (!) 28 18 (!) 31 (!) 30  Temp:      TempSrc:      SpO2: 94% 93% 91% (!) 87%  Weight:      Height:         Intake/Output Summary (Last 24 hours) at 08/10/2024 1330 Last data filed at 08/10/2024 1200 Gross per 24 hour  Intake 460 ml  Output 1500 ml  Net -1040 ml   Filed Weights   07/27/24 1822  Weight: 63.8 kg    Examination:  General exam: Appears calm and comfortable, Frail, severely deconditioned. Respiratory system: Decreased breath sounds. Respiratory effort normal.  RR 28 Cardiovascular system: S1 & S2 heard, RRR. No JVD, murmurs, rubs, gallops or clicks.  Gastrointestinal system: Abdomen is non distended, soft and non tender. Normal bowel sounds heard. Central nervous system: Alert and oriented x 3. No focal neurological deficits. Extremities: No edema, no cyanosis, no clubbing. Skin: No rashes, lesions or ulcers Psychiatry: Judgement and insight appear normal. Mood & affect appropriate.   Data Reviewed: I have personally reviewed following labs and imaging studies  CBC: Recent Labs  Lab 08/06/24 0412 08/09/24 0355 08/10/24 0447  WBC 16.9* 20.2* 17.4*  HGB 12.2* 11.8* 11.5*  HCT 39.6 36.1* 36.5*  MCV 98.8 96.3 97.1  PLT 213 242 236   Basic Metabolic Panel: Recent Labs  Lab 08/06/24 0412 08/09/24 0355  NA  140 140  K 4.4 4.8  CL 103 101  CO2 32 31  GLUCOSE 91 105*  BUN 18 20  CREATININE 0.76 0.81  CALCIUM 9.8 10.0  MG  --  2.1  PHOS  --  2.4*   GFR: Estimated Creatinine Clearance: 66.7 mL/min (by C-G formula based on SCr of 0.81 mg/dL). Liver Function Tests: Recent Labs  Lab 08/06/24 0412  AST 25  ALT 43  ALKPHOS 90  BILITOT 0.3  PROT 6.1*  ALBUMIN 3.1*   No results for input(s): LIPASE, AMYLASE in the last 168 hours. No results for input(s): AMMONIA in the last 168 hours. Coagulation Profile: No results for input(s): INR, PROTIME in the last 168 hours. Cardiac Enzymes: No results for input(s): CKTOTAL, CKMB, CKMBINDEX, TROPONINI in the last 168 hours. BNP (last 3 results) Recent Labs    05/12/24 1726 05/29/24 1826  07/25/24 1314  PROBNP 113.0 113.0 380.0*   HbA1C: No results for input(s): HGBA1C in the last 72 hours. CBG: No results for input(s): GLUCAP in the last 168 hours. Lipid Profile: No results for input(s): CHOL, HDL, LDLCALC, TRIG, CHOLHDL, LDLDIRECT in the last 72 hours. Thyroid  Function Tests: No results for input(s): TSH, T4TOTAL, FREET4, T3FREE, THYROIDAB in the last 72 hours. Anemia Panel: No results for input(s): VITAMINB12, FOLATE, FERRITIN, TIBC, IRON, RETICCTPCT in the last 72 hours. Sepsis Labs: No results for input(s): PROCALCITON, LATICACIDVEN in the last 168 hours.  No results found for this or any previous visit (from the past 240 hours).   Radiology Studies: No results found.  Scheduled Meds:  Chlorhexidine  Gluconate Cloth  6 each Topical Daily   enoxaparin  (LOVENOX ) injection  40 mg Subcutaneous Q24H   feeding supplement  237 mL Oral BID BM   guaiFENesin   600 mg Oral BID   pneumococcal 20-valent conjugate vaccine  0.5 mL Intramuscular Tomorrow-1000   predniSONE   50 mg Oral Q breakfast   Followed by   NOREEN ON 08/16/2024] predniSONE   40 mg Oral Q breakfast   Followed by   NOREEN ON 08/23/2024] predniSONE   30 mg Oral Q breakfast   Followed by   NOREEN ON 08/30/2024] predniSONE   20 mg Oral Q breakfast   Followed by   NOREEN ON 09/06/2024] predniSONE   10 mg Oral Q breakfast   senna  1 tablet Oral BID   sodium chloride  flush  10-40 mL Intracatheter Q12H   sulfamethoxazole -trimethoprim   1 tablet Oral Q M,W,F   Continuous Infusions:   LOS: 16 days    Time spent: 50 mins  Darcel Dawley, MD [facility]   If 7PM-7AM, please contact night-coverage  "

## 2024-08-10 NOTE — Plan of Care (Signed)

## 2024-08-10 NOTE — Progress Notes (Signed)
 " Daily Progress Note   Patient Name: John [name]       Date: 08/10/2024 DOB: 1946/02/27  Age: 79 y.o. MRN#: [id number] Attending Physician: Leotis Bogus, MD Primary Care Physician: Practice, [facility] Admit Date: 07/25/2024 Length of Stay: 16 days  Reason for Follow-up: Establishing goals of care, Non pain symptom management, and Pain control  Subjective:   CC: Patient feels his breathing is slightly improved today.  Following up regarding symptom management and goals of care.  Reviewed PCCM documentation from today.  Continuing with prednisone  taper and prophylaxis for PJP.  At time of EMR review on past 24 hours patient has not taken any as needed oxycodone .  And also at time of EMR review patient receiving HHFNC 50 L/min with FiO2 of 50%. Discussed care with RN for updates.  Presented to bedside to see patient.  Patient laying in bed on HHFNC.  Patient does feel his breathing is slightly improved today.  Noted still had medication if needed for shortness of breath.  Patient noted he is taking things day-to-day with hope for continued improvement.  Spent time providing emotional support via active listening.  Noted palliative medicine team will continue to follow along with patient's medical journey.  Objective:   Vital Signs:  BP (!) 122/45   Pulse 84   Temp 97.6 F (36.4 C) (Oral)   Resp (!) 21   Ht 6' (1.829 m)   Wt 63.8 kg   SpO2 92%   BMI 19.08 kg/m   Physical Exam: General: NAD, alert, chronically ill-appearing, cachectic, frail Cardiovascular: RRR Respiratory: increased work of breathing noted, on HHFNC 50 L/min with FiO2 50 % Abdomen: not distended Neuro: A&Ox4, following commands easily Psych: appropriately answers all questions  Assessment & Plan:   Assessment: Patient is a 79 year old male with a past medical history of tobacco use (quit 20-25 years ago), prostate cancer in 2013, urothelial carcinoma 2022, left kidney lesion status post SBRT  04/2024, hypertension, aortic regurgitation, type I second-degree AV block, cataracts, and metastatic small cell lung cancer who was admitted on 07/25/2024 for management of worsening shortness of breath and cough.  During hospitalization patient has received management for acute hypoxic respiratory failure in the setting of concern for durvalumab  induced pneumonitis and possible concern for lymphangitic spread of cancer.  PCCM and oncology consulted for recommendations.  Palliative medicine team consulted to assist with goals of care and symptom management.  Recommendations/Plan: # Complex medical decision making/goals of care:  - Patient currently continuing with appropriate medical interventions.  Oncology team has noted that if patient improves from his multifocal pneumonia and from a respiratory status, he would be a candidate for further cancer directed therapies.  Appropriately remains DNR/DNI.  Palliative medicine team continuing to engage in conversations as able and appropriate.  - Patient stated if he was unable to make medical decisions for himself, he would want his 2 sisters, John [name] and John [name], to make medical decisions on his behalf.  -  Code Status: Limited: Do not attempt resuscitation (DNR) -DNR-LIMITED -Do Not Intubate/DNI   # Symptom management: Patient is receiving these palliative interventions for symptom management with an intent to improve quality of life.   - Shortness of breath, acute in setting of acute hypoxic respiratory failure with concern for durvalumab  induced pneumonitis or possible lymphangitic spread of cancer   - Continue oral oxycodone  to 7.5 mg every 4 hours as needed  # Discharge Planning: To Be Determined  Discussed with: Patient,  RN  Thank you for allowing the palliative care team to participate in the care John [name].  Tinnie Radar, DO Palliative Care Provider PMT # [phone]  If patient remains symptomatic despite maximum doses,  please call PMT at [phone] between 0700 and 1900. Outside of these hours, please call attending, as PMT does not have night coverage.  Billing based on MDM: Moderate  Problems addressed: 1 or more chronic illnesses with exacerbation, progression, or side effects of treatment  Risks: Prescription drug management      "

## 2024-08-11 DIAGNOSIS — J188 Other pneumonia, unspecified organism: Secondary | ICD-10-CM | POA: Diagnosis not present

## 2024-08-11 NOTE — Plan of Care (Signed)
   Problem: Clinical Measurements: Goal: Ability to maintain clinical measurements within normal limits will improve Outcome: Progressing

## 2024-08-11 NOTE — Progress Notes (Signed)
 " PROGRESS NOTE    John [name]  FMW:[id number] DOB: 1946-03-12 DOA: 07/25/2024 PCP: [facility]   Brief Narrative:  This 79 yrs old Male with PMH significant for small cell lung cancer, prostate cancer s/p brachytherapy, bladder cancer,  hypertension, aortic regurgitation, who Lives at home alone,  ambulates with a cane. 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 Sherrod.  Underwent radiation in the past.  Received last dose of immunotherapy with durvalumab  on 12/16. -12/30, he had a chest x-ray done which resulted 10 days later on 1/10 to show bilateral pneumonia.  Patient continued to have symptoms at that time but then suddenly started having shortness of breath, cough and decreased exercise tolerance.SABRA  He does not have any supplemental oxygen  at home. -1/11, He called Dr. Jeannett office and was called in a prescription for doxycycline which he started on 1/13. -1/14, patient felt significantly short of breath and hence presented to the ED.  He was tachypneic,  tachycardic and hypoxic at 68% on room air requiring 15 L of oxygen  on admission.  Respiratory virus panel was negative. -CT angio chest was negative for pulm embolism but showed patchy consolidation and ground glass bilaterally suggestive of multifocal pneumonia.Partially loculated moderate left pleural effusion. Enlarged pulmonic trunk, indicative of pulmonary arterial hypertension. -Subsequently his oxygen  requirement worsened to 60 L, patient received infliximab  08/04/2024.   -Currently on 50 L he reports he feels a little better than yesterday. Patient remains in stepdown unit,  closely followed by pulmonary.     Assessment & Plan:   Principal Problem:   Multifocal pneumonia Active Problems:   History of lung cancer   Shortness of breath   Sepsis (HCC)   Palliative care by specialist   General  weakness   Need for emotional support   Acute hypoxic respiratory failure (HCC)   Pneumonitis   Counseling and coordination of care   Medication management   Small cell carcinoma of lung (HCC)  Acute hypoxic respiratory failure: Chemotherapy induced pneumonitis: It could be Pneumonitis from recent chemotherapy vs pneumonia in the setting of small cell lung cancer vs lymphangitic spread. CT of the chest did not reveal any pulmonary embolism but showed multifocal infiltrates consistent with consolidation.   Patient admitted with gradually worsening shortness of breath.  O2 Saturation was mid 60s on room air in the ER.   Initially was given Rocephin  and azithromycin  then changed to Zosyn .   Prior hospitalist discussed with Dr. Sherrod recommended prednisone  taper.  Oxygen  requirement still remains high at 60 L heated high flow nasal cannula, 50% FiO2 satting 98%.  On Bactrim  for PJP prophylaxis. He received infliximab  on 08/04/2024 for pneumonitis secondary to chemotherapy.  He was initially given Solu-Medrol  changed to prednisone  tapering dose. [facility] x[facility]ray [facility] showed multifocal bilateral airspace disease with right apical sparing,  small left pleural effusion and mild elevation of the left hemidiaphragm.   Follow-up chest x-ray stable bilateral infiltrates,  small left pleural effusion 1/23 He finished a course of Zosyn . Chest xray 1/27 showed stable patchy bilateral airspace opacities,  stable elevated left diaphragm. Palliative care following.  PCCM following, wants to continue current plan. Wean as tolerated. He is weaned down to 45 L of heated high flow nasal cannula.   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 Sherrod.   Underwent radiation in the past.  Received last dose of immunotherapy with durvalumab  12/16. Oncology following.   Hypertension: H/o moderate aortic  regurgitation: CT angio showed enlarged pulmonary trunk indicating pulmonary artery hypertension. Last echo from 2018 had shown moderate AI.  Repeat echo showed mild to moderate AI PTA meds- amlodipine 10 mg daily.  Currently on hold.  Blood pressure continue to be soft 111/46.   H/o prostate cancer s/p brachytherapy H/o bladder cancer: Follow up Oncology  Leucocytosis : Likely due to steroids. Improving.   DVT prophylaxis: Lovenox  Code Status: DNR Family Communication: Nephew at bed side. Disposition Plan:   Status is: Inpatient Remains inpatient appropriate because: Severity of illness.  He is still requiring 45% of supplemental oxygen  via heated high flow.  He is not medically ready for discharge.  Palliative care following to discuss goals of care.  Consultants:  PCCM  Procedures:  Antimicrobials: Anti-infectives (From admission, onward)    Start     Dose/Rate Route Frequency Ordered Stop   08/03/24 1000  sulfamethoxazole -trimethoprim  (BACTRIM  DS) 800-160 MG per tablet 1 tablet        1 tablet Oral Every M-W-F 08/02/24 1206     07/27/24 1400  piperacillin -tazobactam (ZOSYN ) IVPB 3.375 g        3.375 g 12.5 mL/hr over 240 Minutes Intravenous Every 8 hours 07/27/24 1308 08/03/24 1113   07/25/24 1330  cefTRIAXone  (ROCEPHIN ) 2 g in sodium chloride  0.9 % 100 mL IVPB        2 g 200 mL/hr over 30 Minutes Intravenous Once 07/25/24 1315 07/25/24 1409   07/25/24 1330  azithromycin  (ZITHROMAX ) tablet 500 mg        500 mg Oral  Once 07/25/24 1315 07/25/24 1416       Subjective: Patient was seen and examined at bedside.  Overnight events noted. Patient seems comfortable but still requiring HHFNC @45  L of supplemental oxygen . He reported he slept well last night.  Objective: Vitals:   08/11/24 0820 08/11/24 0900 08/11/24 0910 08/11/24 1000  BP:  (!) 109/49  (!) 127/45  Pulse: 67 71 69 80  Resp: (!) 21 (!) 26 (!) 21 (!) 24  Temp:   (!) 97.2 F (36.2 C)   TempSrc:   Oral    SpO2: 99% 97% 100% 95%  Weight:      Height:        Intake/Output Summary (Last 24 hours) at 08/11/2024 1107 Last data filed at 08/11/2024 0800 Gross per 24 hour  Intake 610 ml  Output 850 ml  Net -240 ml   Filed Weights   07/27/24 1822  Weight: 63.8 kg    Examination:  General exam: Appears calm and comfortable, Frail, severely deconditioned. Respiratory system: Decreased breath sounds. Respiratory effort normal.  RR 28 Cardiovascular system: S1 & S2 heard, RRR. No JVD, murmurs, rubs, gallops or clicks.  Gastrointestinal system: Abdomen is non distended, soft and non tender. Normal bowel sounds heard. Central nervous system: Alert and oriented x 3. No focal neurological deficits. Extremities: No edema, no cyanosis, no clubbing. Skin: No rashes, lesions or ulcers Psychiatry: Judgement and insight appear normal. Mood & affect appropriate.   Data Reviewed: I have personally reviewed following labs and imaging studies  CBC: Recent Labs  Lab 08/06/24 0412 08/09/24 0355 08/10/24 0447  WBC 16.9* 20.2* 17.4*  HGB 12.2* 11.8* 11.5*  HCT 39.6 36.1* 36.5*  MCV 98.8 96.3 97.1  PLT 213 242 236   Basic Metabolic  Panel: Recent Labs  Lab 08/06/24 0412 08/09/24 0355  NA 140 140  K 4.4 4.8  CL 103 101  CO2 32 31  GLUCOSE 91 105*  BUN 18 20  CREATININE 0.76 0.81  CALCIUM 9.8 10.0  MG  --  2.1  PHOS  --  2.4*   GFR: Estimated Creatinine Clearance: 66.7 mL/min (by C-G formula based on SCr of 0.81 mg/dL). Liver Function Tests: Recent Labs  Lab 08/06/24 0412  AST 25  ALT 43  ALKPHOS 90  BILITOT 0.3  PROT 6.1*  ALBUMIN 3.1*   No results for input(s): LIPASE, AMYLASE in the last 168 hours. No results for input(s): AMMONIA in the last 168 hours. Coagulation Profile: No results for input(s): INR, PROTIME in the last 168 hours. Cardiac Enzymes: No results for input(s): CKTOTAL, CKMB, CKMBINDEX, TROPONINI in the last 168 hours. BNP (last 3  results) Recent Labs    05/12/24 1726 05/29/24 1826 07/25/24 1314  PROBNP 113.0 113.0 380.0*   HbA1C: No results for input(s): HGBA1C in the last 72 hours. CBG: Recent Labs  Lab 08/10/24 1634  GLUCAP 184*   Lipid Profile: No results for input(s): CHOL, HDL, LDLCALC, TRIG, CHOLHDL, LDLDIRECT in the last 72 hours. Thyroid  Function Tests: No results for input(s): TSH, T4TOTAL, FREET4, T3FREE, THYROIDAB in the last 72 hours. Anemia Panel: No results for input(s): VITAMINB12, FOLATE, FERRITIN, TIBC, IRON, RETICCTPCT in the last 72 hours. Sepsis Labs: No results for input(s): PROCALCITON, LATICACIDVEN in the last 168 hours.  No results found for this or any previous visit (from the past 240 hours).   Radiology Studies: No results found.  Scheduled Meds:  Chlorhexidine  Gluconate Cloth  6 each Topical Daily   enoxaparin  (LOVENOX ) injection  40 mg Subcutaneous Q24H   feeding supplement  237 mL Oral BID BM   guaiFENesin   600 mg Oral BID   pneumococcal 20-valent conjugate vaccine  0.5 mL Intramuscular Tomorrow-1000   predniSONE   50 mg Oral Q breakfast   Followed by   NOREEN ON 08/16/2024] predniSONE   40 mg Oral Q breakfast   Followed by   NOREEN ON 08/23/2024] predniSONE   30 mg Oral Q breakfast   Followed by   NOREEN ON 08/30/2024] predniSONE   20 mg Oral Q breakfast   Followed by   NOREEN ON 09/06/2024] predniSONE   10 mg Oral Q breakfast   senna  1 tablet Oral BID   sodium chloride  flush  10-40 mL Intracatheter Q12H   sulfamethoxazole -trimethoprim   1 tablet Oral Q M,W,F   Continuous Infusions:   LOS: 17 days    Time spent:  35 mins  Darcel Dawley, MD [facility]   If 7PM-7AM, please contact night-coverage  "

## 2024-08-11 NOTE — Progress Notes (Signed)
 RT note. Patient placed on 10 Salter at this time, patient sat 94% with no labored breathing noted. HHFNC at bedside if needed later. RT will continue to monitor.    08/11/24 1334  Therapy Vitals  Pulse Rate 87  Resp (!) 23  MEWS Score/Color  MEWS Score 1  MEWS Score Color Green  Respiratory Assessment  Assessment Type Assess only  Respiratory Pattern Regular;Unlabored  Bilateral Breath Sounds Diminished  Oxygen  Therapy/Pulse Ox  O2 Device (S)  HFNC  SpO2 94 %  O2 Therapy Oxygen  humidified  O2 Flow Rate (L/min) 10 L/min

## 2024-08-12 DIAGNOSIS — J188 Other pneumonia, unspecified organism: Secondary | ICD-10-CM | POA: Diagnosis not present

## 2024-08-12 NOTE — Plan of Care (Signed)
  Problem: Education: Goal: Knowledge of General Education information will improve Description: Including pain rating scale, medication(s)/side effects and non-pharmacologic comfort measures Outcome: Progressing   Problem: Health Behavior/Discharge Planning: Goal: Ability to manage health-related needs will improve Outcome: Progressing   Problem: Clinical Measurements: Goal: Ability to maintain clinical measurements within normal limits will improve Outcome: Progressing Goal: Will remain free from infection Outcome: Progressing   Problem: Nutrition: Goal: Adequate nutrition will be maintained Outcome: Progressing   Problem: Elimination: Goal: Will not experience complications related to bowel motility Outcome: Progressing Goal: Will not experience complications related to urinary retention Outcome: Progressing   Problem: Pain Managment: Goal: General experience of comfort will improve and/or be controlled Outcome: Progressing

## 2024-08-12 NOTE — Plan of Care (Signed)
" °  Problem: Education: Goal: Knowledge of General Education information will improve Description: Including pain rating scale, medication(s)/side effects and non-pharmacologic comfort measures Outcome: Progressing   Problem: Health Behavior/Discharge Planning: Goal: Ability to manage health-related needs will improve Outcome: Progressing   Problem: Clinical Measurements: Goal: Ability to maintain clinical measurements within normal limits will improve Outcome: Progressing Goal: Will remain free from infection Outcome: Progressing Goal: Diagnostic test results will improve Outcome: Progressing Goal: Respiratory complications will improve Outcome: Progressing Goal: Cardiovascular complication will be avoided Outcome: Progressing   Problem: Activity: Goal: Risk for activity intolerance will decrease Outcome: Progressing   Problem: Nutrition: Goal: Adequate nutrition will be maintained Outcome: Progressing   Problem: Coping: Goal: Level of anxiety will decrease Outcome: Progressing   Problem: Elimination: Goal: Will not experience complications related to bowel motility Outcome: Progressing Goal: Will not experience complications related to urinary retention Outcome: Progressing   Problem: Pain Managment: Goal: General experience of comfort will improve and/or be controlled Outcome: Progressing   Problem: Safety: Goal: Ability to remain free from injury will improve Outcome: Progressing   Problem: Skin Integrity: Goal: Risk for impaired skin integrity will decrease Outcome: Progressing   Cindy S. Loreli BSN, RN, CCRP, CCRN 08/12/2024 4:32 AM "

## 2024-08-12 NOTE — Progress Notes (Signed)
 " PROGRESS NOTE    John [name]  FMW:[id number] DOB: [birth year] DOA: 07/25/2024 PCP: [facility]   Brief Narrative:  This 79 yrs old Male with PMH significant for small cell lung cancer, prostate cancer s/p brachytherapy, bladder cancer,  hypertension, aortic regurgitation, who Lives at home alone,  ambulates with a cane. 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 Sherrod.  Underwent radiation in the past.  Received last dose of immunotherapy with durvalumab  on 12/16. -12/30, he had a chest x-ray done which resulted 10 days later on 1/10 to show bilateral pneumonia.  Patient continued to have symptoms at that time but then suddenly started having shortness of breath, cough and decreased exercise tolerance.SABRA  He does not have any supplemental oxygen  at home. -1/11, He called Dr. Jeannett office and was called in a prescription for doxycycline which he started on 1/13. -1/14, patient felt significantly short of breath and hence presented to the ED.  He was tachypneic,  tachycardic and hypoxic at 68% on room air requiring 15 L of oxygen  on admission.  Respiratory virus panel was negative. -CT angio chest was negative for pulm embolism but showed patchy consolidation and ground glass bilaterally suggestive of multifocal pneumonia.Partially loculated moderate left pleural effusion. Enlarged pulmonic trunk, indicative of pulmonary arterial hypertension. -Subsequently his oxygen  requirement worsened to 60 L, patient received infliximab  08/04/2024.   -Patient remains in stepdown unit,  closely followed by pulmonary. -He is successfully weaned down to 10 L of high flow nasal cannula supplemental oxygen .     Assessment & Plan:   Principal Problem:   Multifocal pneumonia Active Problems:   History of lung cancer   Shortness of breath   Sepsis (HCC)   Palliative care by specialist    General weakness   Need for emotional support   Acute hypoxic respiratory failure (HCC)   Pneumonitis   Counseling and coordination of care   Medication management   Small cell carcinoma of lung (HCC)  Acute hypoxic respiratory failure: Chemotherapy induced pneumonitis: It could be Pneumonitis from recent chemotherapy vs pneumonia in the setting of small cell lung cancer vs lymphangitic spread. CT of the chest did not reveal any pulmonary embolism but showed multifocal infiltrates consistent with consolidation.   Patient admitted with gradually worsening shortness of breath.  O2 Saturation was mid 60s on room air in the ER.   Initially was given Rocephin  and azithromycin  then changed to Zosyn .   Prior hospitalist discussed with Dr. Sherrod recommended prednisone  taper.  On Bactrim  for PJP prophylaxis. He received infliximab  on 08/04/2024 for pneumonitis secondary to chemotherapy.  He was initially given Solu-Medrol  changed to prednisone  tapering dose. Chest x-ray from [facility] showed multifocal bilateral airspace disease with right apical sparing,  small left pleural effusion and mild elevation of the left hemidiaphragm.   Follow-up chest x-ray stable bilateral infiltrates,  small left pleural effusion 1/23 He has finished a course of Zosyn . Chest xray 1/27 showed stable patchy bilateral airspace opacities,  stable elevated left diaphragm. Palliative care following.  PCCM following, wants to continue current plan. Wean as tolerated. He is weaned down to 10 L of heated high flow nasal cannula.   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 Sherrod.   Underwent radiation in the past.  Received last dose  of immunotherapy with durvalumab  12/16. Oncology following.   Hypertension: H/o moderate aortic regurgitation: CT angio showed enlarged pulmonary trunk indicating pulmonary artery  hypertension. Last echo from 2018 had shown moderate AI.  Repeat echo showed mild to moderate AI PTA meds- amlodipine 10 mg daily.  Currently on hold.  Blood pressure continue to be soft 111/46.   H/o prostate cancer s/p brachytherapy H/o bladder cancer: Follow up Oncology  Leucocytosis : Likely due to steroids. Improving.   DVT prophylaxis: Lovenox  Code Status: DNR Family Communication: Nephew at bed side. Disposition Plan:   Status is: Inpatient Remains inpatient appropriate because: Severity of illness.  He is still requiring 10% of supplemental oxygen  via heated high flow.  He is not medically ready for discharge.  Palliative care following to discuss goals of care.  Consultants:  PCCM  Procedures:  Antimicrobials: Anti-infectives (From admission, onward)    Start     Dose/Rate Route Frequency Ordered Stop   08/03/24 1000  sulfamethoxazole -trimethoprim  (BACTRIM  DS) 800-160 MG per tablet 1 tablet        1 tablet Oral Every M-W-F 08/02/24 1206     07/27/24 1400  piperacillin -tazobactam (ZOSYN ) IVPB 3.375 g        3.375 g 12.5 mL/hr over 240 Minutes Intravenous Every 8 hours 07/27/24 1308 08/03/24 1113   07/25/24 1330  cefTRIAXone  (ROCEPHIN ) 2 g in sodium chloride  0.9 % 100 mL IVPB        2 g 200 mL/hr over 30 Minutes Intravenous Once 07/25/24 1315 07/25/24 1409   07/25/24 1330  azithromycin  (ZITHROMAX ) tablet 500 mg        500 mg Oral  Once 07/25/24 1315 07/25/24 1416       Subjective: Patient was seen and examined at bedside.  Overnight events noted. Patient seems much comfortable but still requiring HHFNC @10  L of supplemental oxygen . He reported he slept well last night. He denies any other concerns.  Objective: Vitals:   08/12/24 0700 08/12/24 0758 08/12/24 0800 08/12/24 0841  BP: (!) 122/48  (!) 125/43   Pulse: 74 81 78 76  Resp: 20 (!) 25 (!) 23 18  Temp:   97.9 F (36.6 C)   TempSrc:   Oral   SpO2: 98% 97% 100% 96%  Weight:      Height:         Intake/Output Summary (Last 24 hours) at 08/12/2024 1006 Last data filed at 08/12/2024 0600 Gross per 24 hour  Intake 210 ml  Output 1950 ml  Net -1740 ml   Filed Weights   07/27/24 1822  Weight: 63.8 kg    Examination:  General exam: Appears calm and comfortable, Frail, severely deconditioned. Respiratory system: Decreased breath sounds. Respiratory effort normal.  RR 23 Cardiovascular system: S1 & S2 heard, RRR. No JVD, murmurs, rubs, gallops or clicks.  Gastrointestinal system: Abdomen is non distended, soft and non tender. Normal bowel sounds heard. Central nervous system: Alert and oriented x 3. No focal neurological deficits. Extremities: No edema, no cyanosis, no clubbing. Skin: No rashes, lesions or ulcers Psychiatry: Judgement and insight appear normal. Mood & affect appropriate.   Data Reviewed: I have personally reviewed following labs and imaging studies  CBC: Recent Labs  Lab 08/06/24 0412 08/09/24 0355 08/10/24 0447  WBC 16.9* 20.2* 17.4*  HGB 12.2* 11.8* 11.5*  HCT 39.6 36.1* 36.5*  MCV 98.8 96.3 97.1  PLT 213 242 236   Basic Metabolic Panel: Recent Labs  Lab 08/06/24 0412 08/09/24 0355  NA 140  140  K 4.4 4.8  CL 103 101  CO2 32 31  GLUCOSE 91 105*  BUN 18 20  CREATININE 0.76 0.81  CALCIUM 9.8 10.0  MG  --  2.1  PHOS  --  2.4*   GFR: Estimated Creatinine Clearance: 66.7 mL/min (by C-G formula based on SCr of 0.81 mg/dL). Liver Function Tests: Recent Labs  Lab 08/06/24 0412  AST 25  ALT 43  ALKPHOS 90  BILITOT 0.3  PROT 6.1*  ALBUMIN 3.1*   No results for input(s): LIPASE, AMYLASE in the last 168 hours. No results for input(s): AMMONIA in the last 168 hours. Coagulation Profile: No results for input(s): INR, PROTIME in the last 168 hours. Cardiac Enzymes: No results for input(s): CKTOTAL, CKMB, CKMBINDEX, TROPONINI in the last 168 hours. BNP (last 3 results) Recent Labs    05/12/24 1726 05/29/24 1826  07/25/24 1314  PROBNP 113.0 113.0 380.0*   HbA1C: No results for input(s): HGBA1C in the last 72 hours. CBG: Recent Labs  Lab 08/10/24 1634  GLUCAP 184*   Lipid Profile: No results for input(s): CHOL, HDL, LDLCALC, TRIG, CHOLHDL, LDLDIRECT in the last 72 hours. Thyroid  Function Tests: No results for input(s): TSH, T4TOTAL, FREET4, T3FREE, THYROIDAB in the last 72 hours. Anemia Panel: No results for input(s): VITAMINB12, FOLATE, FERRITIN, TIBC, IRON, RETICCTPCT in the last 72 hours. Sepsis Labs: No results for input(s): PROCALCITON, LATICACIDVEN in the last 168 hours.  No results found for this or any previous visit (from the past 240 hours).   Radiology Studies: No results found.  Scheduled Meds:  Chlorhexidine  Gluconate Cloth  6 each Topical Daily   enoxaparin  (LOVENOX ) injection  40 mg Subcutaneous Q24H   feeding supplement  237 mL Oral BID BM   guaiFENesin   600 mg Oral BID   pneumococcal 20-valent conjugate vaccine  0.5 mL Intramuscular Tomorrow-1000   predniSONE   50 mg Oral Q breakfast   Followed by   NOREEN ON 08/16/2024] predniSONE   40 mg Oral Q breakfast   Followed by   NOREEN ON 08/23/2024] predniSONE   30 mg Oral Q breakfast   Followed by   NOREEN ON 08/30/2024] predniSONE   20 mg Oral Q breakfast   Followed by   NOREEN ON 09/06/2024] predniSONE   10 mg Oral Q breakfast   senna  1 tablet Oral BID   sodium chloride  flush  10-40 mL Intracatheter Q12H   sulfamethoxazole -trimethoprim   1 tablet Oral Q M,W,F   Continuous Infusions:   LOS: 18 days    Time spent:  35 mins  Darcel Dawley, MD [facility]   If 7PM-7AM, please contact night-coverage  "

## 2024-08-13 DIAGNOSIS — Z66 Do not resuscitate: Secondary | ICD-10-CM | POA: Diagnosis not present

## 2024-08-13 DIAGNOSIS — J9601 Acute respiratory failure with hypoxia: Secondary | ICD-10-CM | POA: Diagnosis not present

## 2024-08-13 DIAGNOSIS — J188 Other pneumonia, unspecified organism: Secondary | ICD-10-CM | POA: Diagnosis not present

## 2024-08-13 DIAGNOSIS — C3490 Malignant neoplasm of unspecified part of unspecified bronchus or lung: Secondary | ICD-10-CM | POA: Diagnosis not present

## 2024-08-13 DIAGNOSIS — T451X5A Adverse effect of antineoplastic and immunosuppressive drugs, initial encounter: Secondary | ICD-10-CM | POA: Diagnosis not present

## 2024-08-13 DIAGNOSIS — Z515 Encounter for palliative care: Secondary | ICD-10-CM | POA: Diagnosis not present

## 2024-08-13 LAB — BASIC METABOLIC PANEL WITH GFR
Anion gap: 6 (ref 5–15)
BUN: 17 mg/dL (ref 8–23)
CO2: 32 mmol/L (ref 22–32)
Calcium: 9.9 mg/dL (ref 8.9–10.3)
Chloride: 99 mmol/L (ref 98–111)
Creatinine, Ser: 0.84 mg/dL (ref 0.61–1.24)
GFR, Estimated: >60 mL/min
Glucose, Bld: 113 mg/dL — ABNORMAL HIGH (ref 70–99)
Potassium: 4.2 mmol/L (ref 3.5–5.1)
Sodium: 138 mmol/L (ref 135–145)

## 2024-08-13 LAB — CBC
HCT: 38.2 % — ABNORMAL LOW (ref 39.0–52.0)
Hemoglobin: 12.4 g/dL — ABNORMAL LOW (ref 13.0–17.0)
MCH: 31.3 pg (ref 26.0–34.0)
MCHC: 32.5 g/dL (ref 30.0–36.0)
MCV: 96.5 fL (ref 80.0–100.0)
Platelets: 212 10*3/uL (ref 150–400)
RBC: 3.96 MIL/uL — ABNORMAL LOW (ref 4.22–5.81)
RDW: 13.2 % (ref 11.5–15.5)
WBC: 16.1 10*3/uL — ABNORMAL HIGH (ref 4.0–10.5)
nRBC: 0 % (ref 0.0–0.2)

## 2024-08-13 NOTE — Progress Notes (Signed)
 " PROGRESS NOTE    John [name]  FMW:[id number] DOB: 1945-07-20 DOA: 07/25/2024 PCP: [facility]   Brief Narrative:  This 79 yrs old Male with PMH significant for small cell lung cancer, prostate cancer s/p brachytherapy, bladder cancer,  hypertension, aortic regurgitation, who Lives at home alone,  ambulates with a cane. 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 Sherrod.  Underwent radiation in the past.  Received last dose of immunotherapy with durvalumab  on 12/16. -12/30, he had a chest x-ray done which resulted 10 days later on 1/10 to show bilateral pneumonia.  Patient continued to have symptoms at that time but then suddenly started having shortness of breath, cough and decreased exercise tolerance.SABRA  He does not have any supplemental oxygen  at home. -1/11, He called Dr. Jeannett office and was called in a prescription for doxycycline which he started on 1/13. -1/14, patient felt significantly short of breath and hence presented to the ED.  He was tachypneic,  tachycardic and hypoxic at 68% on room air requiring 15 L of oxygen  on admission.  Respiratory virus panel was negative. -CT angio chest was negative for pulm embolism but showed patchy consolidation and ground glass bilaterally suggestive of multifocal pneumonia.Partially loculated moderate left pleural effusion. Enlarged pulmonic trunk, indicative of pulmonary arterial hypertension. -Subsequently his oxygen  requirement worsened to 60 L, patient received infliximab  on 08/04/2024.   -Patient remains in stepdown unit,  closely followed by pulmonary. -He is successfully weaned down to 8 L of high flow nasal cannula supplemental oxygen .     Assessment & Plan:   Principal Problem:   Multifocal pneumonia Active Problems:   History of lung cancer   Shortness of breath   Sepsis (HCC)   Palliative care by  specialist   General weakness   Need for emotional support   Acute hypoxic respiratory failure (HCC)   Pneumonitis   Counseling and coordination of care   Medication management   Small cell carcinoma of lung (HCC)  Acute hypoxic respiratory failure: Chemotherapy induced pneumonitis: It could be Pneumonitis from recent chemotherapy vs pneumonia in the setting of small cell lung cancer vs lymphangitic spread. CT of the chest did not reveal any pulmonary embolism but showed multifocal infiltrates consistent with consolidation.   Patient admitted with spo2 60s on room air in the ER.   Prior hospitalist discussed with Dr. Sherrod recommended prednisone  taper.  On Bactrim  for PJP prophylaxis. He received infliximab  on 08/04/2024 for pneumonitis secondary to chemotherapy.  He was initially given Solu-Medrol  changed to prednisone  tapering dose. [facility] x-ray from [facility] showed multifocal bilateral airspace disease with right apical sparing,  small left pleural effusion and mild elevation of the left hemidiaphragm.   Follow-up chest x-ray stable bilateral infiltrates,  small left pleural effusion 1/23 He has finished a course of antibiotics  initially ceftriaxone  and Zithromax  and then Zosyn . Chest xray 1/27 showed stable patchy bilateral airspace opacities,  stable elevated left diaphragm. Palliative care following.  PCCM following, wants to continue current plan. Wean as tolerated. He is weaned down to 8L of supplemental O2 [place].   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 Sherrod.   Underwent radiation in the past.  Received last dose of immunotherapy with durvalumab  12/16. Oncology following.   Hypertension: H/o moderate aortic regurgitation: CT  angio showed enlarged pulmonary trunk indicating pulmonary artery hypertension. Last echo from 2018 had shown moderate AI.  Repeat echo showed mild to  moderate AI PTA meds- amlodipine 10 mg daily.  Currently on hold.  Blood pressure continue to be soft 111/46.   H/o prostate cancer s/p brachytherapy: H/o bladder cancer: Follow up Oncology.  Leucocytosis : Likely due to steroids. Improving.   DVT prophylaxis: Lovenox  Code Status: DNR Family Communication: Nephew at bed side. Disposition Plan:   Status is: Inpatient Remains inpatient appropriate because: Severity of illness.  He is still requiring 18L of supplemental oxygen . He is not medically ready for discharge.  Palliative care following to discuss goals of care.  Consultants:  PCCM  Procedures:  Antimicrobials: Anti-infectives (From admission, onward)    Start     Dose/Rate Route Frequency Ordered Stop   08/03/24 1000  sulfamethoxazole -trimethoprim  (BACTRIM  DS) 800-160 MG per tablet 1 tablet        1 tablet Oral Every M-W-F 08/02/24 1206     07/27/24 1400  piperacillin -tazobactam (ZOSYN ) IVPB 3.375 g        3.375 g 12.5 mL/hr over 240 Minutes Intravenous Every 8 hours 07/27/24 1308 08/03/24 1113   07/25/24 1330  cefTRIAXone  (ROCEPHIN ) 2 g in sodium chloride  0.9 % 100 mL IVPB        2 g 200 mL/hr over 30 Minutes Intravenous Once 07/25/24 1315 07/25/24 1409   07/25/24 1330  azithromycin  (ZITHROMAX ) tablet 500 mg        500 mg Oral  Once 07/25/24 1315 07/25/24 1416       Subjective: Patient was seen and examined at bedside.  Overnight events noted. Patient seems much comfortable but still requiring 8 L of supplemental oxygen . He reported he slept well last night. He denies any other concerns.  Objective: Vitals:   08/13/24 0600 08/13/24 0700 08/13/24 0755 08/13/24 0800  BP: (!) 131/44 (!) 123/51  (!) 108/47  Pulse: 69 79  78  Resp: 12 15  15   Temp:   (!) 97.4 F (36.3 C)   TempSrc:   Oral   SpO2: 98% 97%  97%  Weight:      Height:        Intake/Output Summary (Last 24 hours) at 08/13/2024 1037 Last data filed at 08/13/2024 0600 Gross per 24 hour  Intake --   Output 2200 ml  Net -2200 ml   Filed Weights   07/27/24 1822  Weight: 63.8 kg    Examination:  General exam: Appears calm and comfortable, Frail, severely deconditioned. Respiratory system: Decreased breath sounds. Respiratory effort normal.  RR 20 Cardiovascular system: S1 & S2 heard, RRR. No JVD, murmurs, rubs, gallops or clicks.  Gastrointestinal system: Abdomen is non distended, soft and non tender. Normal bowel sounds heard. Central nervous system: Alert and oriented x 3. No focal neurological deficits. Extremities: No edema, no cyanosis, no clubbing. Skin: No rashes, lesions or ulcers Psychiatry: Judgement and insight appear normal. Mood & affect appropriate.   Data Reviewed: I have personally reviewed following labs and imaging studies  CBC: Recent Labs  Lab 08/09/24 0355 08/10/24 0447 08/13/24 0417  WBC 20.2* 17.4* 16.1*  HGB 11.8* 11.5* 12.4*  HCT 36.1* 36.5* 38.2*  MCV 96.3 97.1 96.5  PLT 242 236 212   Basic Metabolic Panel: Recent Labs  Lab 08/09/24 0355 08/13/24 0417  NA 140 138  K 4.8 4.2  CL 101 99  CO2 31 32  GLUCOSE 105* 113*  BUN 20 17  CREATININE 0.81 0.84  CALCIUM 10.0 9.9  MG 2.1  --   PHOS 2.4*  --    GFR: Estimated Creatinine Clearance: 64.3 mL/min (by C-G formula based on SCr of 0.84 mg/dL). Liver Function Tests: No results for input(s): AST, ALT, ALKPHOS, BILITOT, PROT, ALBUMIN in the last 168 hours.  No results for input(s): LIPASE, AMYLASE in the last 168 hours. No results for input(s): AMMONIA in the last 168 hours. Coagulation Profile: No results for input(s): INR, PROTIME in the last 168 hours. Cardiac Enzymes: No results for input(s): CKTOTAL, CKMB, CKMBINDEX, TROPONINI in the last 168 hours. BNP (last 3 results) Recent Labs    05/12/24 1726 05/29/24 1826 07/25/24 1314  PROBNP 113.0 113.0 380.0*   HbA1C: No results for input(s): HGBA1C in the last 72 hours. CBG: Recent Labs  Lab  08/10/24 1634  GLUCAP 184*   Lipid Profile: No results for input(s): CHOL, HDL, LDLCALC, TRIG, CHOLHDL, LDLDIRECT in the last 72 hours. Thyroid  Function Tests: No results for input(s): TSH, T4TOTAL, FREET4, T3FREE, THYROIDAB in the last 72 hours. Anemia Panel: No results for input(s): VITAMINB12, FOLATE, FERRITIN, TIBC, IRON, RETICCTPCT in the last 72 hours. Sepsis Labs: No results for input(s): PROCALCITON, LATICACIDVEN in the last 168 hours.  No results found for this or any previous visit (from the past 240 hours).   Radiology Studies: No results found.  Scheduled Meds:  Chlorhexidine  Gluconate Cloth  6 each Topical Daily   enoxaparin  (LOVENOX ) injection  40 mg Subcutaneous Q24H   feeding supplement  237 mL Oral BID BM   guaiFENesin   600 mg Oral BID   pneumococcal 20-valent conjugate vaccine  0.5 mL Intramuscular Tomorrow-1000   predniSONE   50 mg Oral Q breakfast   Followed by   NOREEN ON 08/16/2024] predniSONE   40 mg Oral Q breakfast   Followed by   NOREEN ON 08/23/2024] predniSONE   30 mg Oral Q breakfast   Followed by   NOREEN ON 08/30/2024] predniSONE   20 mg Oral Q breakfast   Followed by   NOREEN ON 09/06/2024] predniSONE   10 mg Oral Q breakfast   senna  1 tablet Oral BID   sodium chloride  flush  10-40 mL Intracatheter Q12H   sulfamethoxazole -trimethoprim   1 tablet Oral Q M,W,F   Continuous Infusions:   LOS: 19 days    Time spent:  35 mins  Darcel Dawley, MD [facility]   If 7PM-7AM, please contact night-coverage  "

## 2024-08-13 NOTE — Plan of Care (Signed)
  Problem: Education: Goal: Knowledge of General Education information will improve Description Including pain rating scale, medication(s)/side effects and non-pharmacologic comfort measures Outcome: Progressing   Problem: Health Behavior/Discharge Planning: Goal: Ability to manage health-related needs will improve Outcome: Progressing   Problem: Clinical Measurements: Goal: Ability to maintain clinical measurements within normal limits will improve Outcome: Progressing Goal: Respiratory complications will improve Outcome: Progressing   Problem: Coping: Goal: Level of anxiety will decrease Outcome: Progressing   

## 2024-08-13 NOTE — Progress Notes (Signed)
 " Daily Progress Note   Patient Name: John [name]       Date: 08/13/2024 DOB: [birth year]  Age: 79 y.o. MRN#: [id number] Attending Physician: Leotis Bogus, MD Primary Care Physician: Practice, [facility] Admit Date: 07/25/2024 Length of Stay: 19 days  Reason for Follow-up: Establishing goals of care, Non pain symptom management, and Pain control  Subjective:   CC: Feels breathing is slowly improving.  Following up regarding goals of care and symptom management.  Reviewed hospitalist documentation for today noting patient has finished courses of antibiotics at this time.  Patient continues to receive steroid taper for with concerns of pneumonitis.  Reviewed PCCM documentation noting patient having decreased level of oxygen  support though will have episodes of desaturation particularly with movement.  At time of EMR review in past 24 hours patient has received as needed oxycodone  7.5 mg x 2 doses. Discussed care with RN for medical updates.  Presented to bedside to see patient.  Patient laying comfortably in bed.  Inquired about symptom burden at this time.  Patient feels his breathing is slowly improving.  Inquired about management with use of as needed oxycodone .  Patient does feel the as needed oxycodone  helps with his shortness of breath.  He does not feel the dose needs to be further adjusted.  Spent time educating patient on recommendation to take as needed oxycodone  about 30 to 45 minutes prior to working with PT to improve management of his shortness of breath so he can be as active as possible.   Also inquired about recent bowel movement and patient stated he had 1 this morning.  Noted importance of having regular bowel movements while taking opioids.  Spent time answering questions as able and providing emotional support via active listening.  Noted palliative medicine team will continue to follow with patient's medical journey.  Objective:   Vital Signs:  BP (!) 131/44    Pulse 69   Temp (!) 97.4 F (36.3 C) (Axillary)   Resp 12   Ht 6' (1.829 m)   Wt 63.8 kg   SpO2 98%   BMI 19.08 kg/m   Physical Exam: General: NAD, alert, chronically ill-appearing, cachectic, frail Cardiovascular: RRR Respiratory: No increased work of breathing noted, on HFNC 8 L/min Abdomen: not distended Neuro: A&Ox4, following commands easily Psych: appropriately answers all questions  Assessment & Plan:   Assessment: Patient is a 79 year old male with a past medical history of tobacco use (quit 20-25 years ago), prostate cancer in 2013, urothelial carcinoma 2022, 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  During hospitalization patient has received management for acute hypoxic respiratory failure in the setting of concern for durvalumab  induced pneumonitis and possible concern for lymphangitic spread of cancer.  PCCM and oncology consulted for recommendations.  Palliative medicine team consulted to assist with goals of care and symptom management.  Recommendations/Plan: # Complex medical decision making/goals of care:  - Patient currently continuing with appropriate medical interventions.  Oncology team has noted that if patient improves from his multifocal pneumonia and from a respiratory status, he would be a candidate for further cancer directed therapies.  Appropriately remains DNR/DNI.  Palliative medicine team continuing to engage in conversations as able and appropriate.  - Patient stated if he was unable to make medical decisions for himself, he would want his 2  sisters, John [name] and John [name], to make medical decisions on his behalf.  -  Code Status: Limited: Do not attempt resuscitation (DNR) -DNR-LIMITED -Do Not Intubate/DNI   # Symptom management: Patient is  receiving these palliative interventions for symptom management with an intent to improve quality of life.   - Shortness of breath, acute in setting of acute hypoxic respiratory failure with concern for durvalumab  induced pneumonitis or possible lymphangitic spread of cancer   - Continue oral oxycodone  to 7.5 mg every 4 hours as needed.  Patient currently feels this dose is working well to assist with his work of breathing when needed.  Noted can increase further in the future if needed.  Did recommend patient take medication about 30 to 45 minutes prior to activity when he will be moving around to help with shortness of breath.  # Discharge Planning: To Be Determined  Discussed with: Patient, RN  Thank you for allowing the palliative care team to participate in the care John [name].  Tinnie Radar, DO Palliative Care Provider PMT # [phone]  If patient remains symptomatic despite maximum doses, please call PMT at [phone] between 0700 and 1900. Outside of these hours, please call attending, as PMT does not have night coverage.  Billing based on MDM: Moderate  Problems addressed: 1 or more chronic illnesses with exacerbation, progression, or side effects of treatment  Risks: Prescription drug management    "

## 2024-08-13 NOTE — Plan of Care (Signed)
" °  Problem: Education: Goal: Knowledge of General Education information will improve Description: Including pain rating scale, medication(s)/side effects and non-pharmacologic comfort measures Outcome: Progressing   Problem: Health Behavior/Discharge Planning: Goal: Ability to manage health-related needs will improve Outcome: Progressing   Problem: Clinical Measurements: Goal: Ability to maintain clinical measurements within normal limits will improve Outcome: Progressing Goal: Will remain free from infection Outcome: Progressing Goal: Diagnostic test results will improve Outcome: Progressing Goal: Respiratory complications will improve Outcome: Progressing Goal: Cardiovascular complication will be avoided Outcome: Progressing   Problem: Activity: Goal: Risk for activity intolerance will decrease Outcome: Progressing   Problem: Nutrition: Goal: Adequate nutrition will be maintained Outcome: Progressing   Problem: Coping: Goal: Level of anxiety will decrease Outcome: Progressing   Problem: Elimination: Goal: Will not experience complications related to bowel motility Outcome: Progressing Goal: Will not experience complications related to urinary retention Outcome: Progressing   Problem: Pain Managment: Goal: General experience of comfort will improve and/or be controlled Outcome: Progressing   Problem: Safety: Goal: Ability to remain free from injury will improve Outcome: Progressing   Problem: Skin Integrity: Goal: Risk for impaired skin integrity will decrease Outcome: Progressing   Cindy S. Loreli BSN, RN, [facility], CCRN 08/13/2024 6:41 AM "

## 2024-08-14 NOTE — Progress Notes (Signed)
 " PROGRESS NOTE    John [name]  FMW:[id number] DOB: 1946-07-09 DOA: 07/25/2024 PCP: [facility]   Brief Narrative:  This 79 yrs old Male with PMH significant for small cell lung cancer, prostate cancer s/p brachytherapy, bladder cancer,  hypertension, aortic regurgitation, who Lives at home alone,  ambulates with a cane. 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 Sherrod.  Underwent radiation in the past.  Received last dose of immunotherapy with durvalumab  on 12/16. -12/30, he had a chest x-ray done which resulted 10 days later on 1/10 to show bilateral pneumonia.  Patient continued to have symptoms at that time but then suddenly started having shortness of breath, cough and decreased exercise tolerance.SABRA  He does not have any supplemental oxygen  at home. -1/11, He called Dr. Jeannett office and was called in a prescription for doxycycline which he started on 1/13. -1/14, patient felt significantly short of breath and hence presented to the ED.  He was tachypneic,  tachycardic and hypoxic at 68% on room air requiring 15 L of oxygen  on admission.  Respiratory virus panel was negative. -CT angio chest was negative for pulm embolism but showed patchy consolidation and ground glass bilaterally suggestive of multifocal pneumonia.Partially loculated moderate left pleural effusion. Enlarged pulmonic trunk, indicative of pulmonary arterial hypertension. -Subsequently his oxygen  requirement worsened to 60 L, patient received infliximab  on 08/04/2024.   -Patient remains in stepdown unit,  closely followed by pulmonary. -He is successfully weaned down to 8 L of high flow nasal cannula supplemental oxygen .     Assessment & Plan:   Principal Problem:   Multifocal pneumonia Active Problems:   History of lung cancer   Shortness of breath   Sepsis (HCC)   Palliative care by  specialist   General weakness   Need for emotional support   Acute hypoxic respiratory failure (HCC)   Pneumonitis   Counseling and coordination of care   Medication management   Small cell carcinoma of lung (HCC)   DNR (do not resuscitate)  Acute hypoxic respiratory failure: Chemotherapy induced pneumonitis: It could be Pneumonitis from recent chemotherapy vs pneumonia in the setting of small cell lung cancer vs lymphangitic spread. CT of the chest did not reveal any pulmonary embolism but showed multifocal infiltrates consistent with consolidation.   Patient admitted with spo2 60s on room air in the ER.   Prior hospitalist discussed with Dr. Sherrod recommended prednisone  taper.  On Bactrim  for PJP prophylaxis. He received infliximab  on 08/04/2024 for pneumonitis secondary to chemotherapy.  He was initially given Solu-Medrol  changed to prednisone  tapering dose. Chest x-ray from 21st showed multifocal bilateral airspace disease with right apical sparing,  small left pleural effusion and mild elevation of the left hemidiaphragm.   Follow-up chest x-ray stable bilateral infiltrates,  small left pleural effusion 1/23 He has finished a course of antibiotics  initially ceftriaxone  and Zithromax  and then Zosyn . Chest xray 1/27 showed stable patchy bilateral airspace opacities,  stable elevated left diaphragm. Palliative care following.  PCCM following, wants to continue current plan. Wean as tolerated. He is weaned down to 8L of supplemental O2 [place].   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 Sherrod.   Underwent radiation in the past.  Received last dose of immunotherapy with durvalumab  12/16. Oncology following.  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 Last echo from 2018 had shown moderate AI.   Repeat echo showed mild to moderate AI PTA meds- amlodipine 10 mg daily.  Currently on hold.  Blood pressure continue to be soft 111/46.   H/o prostate cancer s/p brachytherapy: H/o bladder cancer: Follow up Oncology.  Leucocytosis : Likely due to steroids. Improving.   DVT prophylaxis: Lovenox  Code Status: DNR Family Communication: Nephew at bed side. Disposition Plan:   Status is: Inpatient Remains inpatient appropriate because: Severity of illness.  He is still requiring 8L of supplemental oxygen . He is not medically ready for discharge.  Palliative care following to discuss goals of care.  Consultants:  PCCM  Procedures:  Antimicrobials: Anti-infectives (From admission, onward)    Start     Dose/Rate Route Frequency Ordered Stop   08/03/24 1000  sulfamethoxazole -trimethoprim  (BACTRIM  DS) 800-160 MG per tablet 1 tablet        1 tablet Oral Every M-W-F 08/02/24 1206     07/27/24 1400  piperacillin -tazobactam (ZOSYN ) IVPB 3.375 g        3.375 g 12.5 mL/hr over 240 Minutes Intravenous Every 8 hours 07/27/24 1308 08/03/24 1113   07/25/24 1330  cefTRIAXone  (ROCEPHIN ) 2 g in sodium chloride  0.9 % 100 mL IVPB        2 g 200 mL/hr over 30 Minutes Intravenous Once 07/25/24 1315 07/25/24 1409   07/25/24 1330  azithromycin  (ZITHROMAX ) tablet 500 mg        500 mg Oral  Once 07/25/24 1315 07/25/24 1416       Subjective: Patient was seen and examined at bedside.  Overnight events noted. Patient seems much comfortable but still requiring 8 L of supplemental oxygen . He reported he slept well last night. He denies any other concerns.  Objective: Vitals:   08/14/24 0418 08/14/24 0500 08/14/24 0600 08/14/24 0740  BP:      Pulse:  82 80   Resp:  (!) 22 (!) 25   Temp: 97.8 F (36.6 C)   (!) 97.4 F (36.3 C)  TempSrc: Axillary   Oral  SpO2:  97% 100%   Weight:      Height:        Intake/Output Summary (Last 24 hours) at 08/14/2024 0954 Last data filed at 08/14/2024 0418 Gross  per 24 hour  Intake 440 ml  Output 2450 ml  Net -2010 ml   Filed Weights   07/27/24 1822  Weight: 63.8 kg    Examination:  General exam: Appears calm and comfortable, Frail, severely deconditioned. Respiratory system: Decreased breath sounds. Respiratory effort normal.  RR 20 Cardiovascular system: S1 & S2 heard, RRR. No JVD, murmurs, rubs, gallops or clicks.  Gastrointestinal system: Abdomen is non distended, soft and non tender. Normal bowel sounds heard. Central nervous system: Alert and oriented x 3. No focal neurological deficits. Extremities: No edema, no cyanosis, no clubbing. Skin: No rashes, lesions or ulcers Psychiatry: Judgement and insight appear normal. Mood & affect appropriate.   Data Reviewed: I have personally reviewed following labs and imaging studies  CBC: Recent Labs  Lab 08/09/24 0355 08/10/24 0447 08/13/24 0417  WBC 20.2* 17.4* 16.1*  HGB 11.8* 11.5* 12.4*  HCT 36.1* 36.5* 38.2*  MCV 96.3 97.1 96.5  PLT 242 236 212   Basic Metabolic Panel: Recent Labs  Lab 08/09/24 0355 08/13/24 0417  NA 140 138  K 4.8 4.2  CL 101 99  CO2 31 32  GLUCOSE 105* 113*  BUN 20 17  CREATININE 0.81 0.84  CALCIUM 10.0 9.9  MG 2.1  --   PHOS 2.4*  --    GFR: Estimated Creatinine Clearance: 64.3 mL/min (by C-G formula based on SCr of 0.84 mg/dL). Liver Function Tests: No results for input(s): AST, ALT, ALKPHOS, BILITOT, PROT, ALBUMIN in the last 168 hours.  No results for input(s): LIPASE, AMYLASE in the last 168 hours. No results for input(s): AMMONIA in the last 168 hours. Coagulation Profile: No results for input(s): INR, PROTIME in the last 168 hours. Cardiac Enzymes: No results for input(s): CKTOTAL, CKMB, CKMBINDEX, TROPONINI in the last 168 hours. BNP (last 3 results) Recent Labs    05/12/24 1726 05/29/24 1826 07/25/24 1314  PROBNP 113.0 113.0 380.0*   HbA1C: No results for input(s): HGBA1C in the last 72  hours. CBG: Recent Labs  Lab 08/10/24 1634  GLUCAP 184*   Lipid Profile: No results for input(s): CHOL, HDL, LDLCALC, TRIG, CHOLHDL, LDLDIRECT in the last 72 hours. Thyroid  Function Tests: No results for input(s): TSH, T4TOTAL, FREET4, T3FREE, THYROIDAB in the last 72 hours. Anemia Panel: No results for input(s): VITAMINB12, FOLATE, FERRITIN, TIBC, IRON, RETICCTPCT in the last 72 hours. Sepsis Labs: No results for input(s): PROCALCITON, LATICACIDVEN in the last 168 hours.  No results found for this or any previous visit (from the past 240 hours).   Radiology Studies: No results found.  Scheduled Meds:  Chlorhexidine  Gluconate Cloth  6 each Topical Daily   enoxaparin  (LOVENOX ) injection  40 mg Subcutaneous Q24H   feeding supplement  237 mL Oral BID BM   guaiFENesin   600 mg Oral BID   pneumococcal 20-valent conjugate vaccine  0.5 mL Intramuscular Tomorrow-1000   predniSONE   50 mg Oral Q breakfast   Followed by   NOREEN ON 08/16/2024] predniSONE   40 mg Oral Q breakfast   Followed by   NOREEN ON 08/23/2024] predniSONE   30 mg Oral Q breakfast   Followed by   NOREEN ON 08/30/2024] predniSONE   20 mg Oral Q breakfast   Followed by   NOREEN ON 09/06/2024] predniSONE   10 mg Oral Q breakfast   senna  1 tablet Oral BID   sodium chloride  flush  10-40 mL Intracatheter Q12H   sulfamethoxazole -trimethoprim   1 tablet Oral Q M,W,F   Continuous Infusions:   LOS: 20 days    Time spent:  35 mins  Darcel Dawley, MD [facility]   If 7PM-7AM, please contact night-coverage  "

## 2024-08-14 NOTE — Plan of Care (Signed)
   Problem: Education: Goal: Knowledge of General Education information will improve Description Including pain rating scale, medication(s)/side effects and non-pharmacologic comfort measures Outcome: Progressing

## 2024-08-15 LAB — PRO BRAIN NATRIURETIC PEPTIDE: Pro Brain Natriuretic Peptide: 138 pg/mL

## 2024-08-15 MED ORDER — OXYCODONE HCL 5 MG PO TABS: 5.0000 mg | ORAL_TABLET | Freq: Four times a day (QID) | ORAL | Status: DC | PRN

## 2024-08-15 MED ORDER — OXYCODONE HCL 5 MG PO TABS
7.5000 mg | ORAL_TABLET | ORAL | Status: AC | PRN
  Administered 2024-08-15: 7.5 mg via ORAL
  Filled 2024-08-15: qty 2

## 2024-08-15 NOTE — Progress Notes (Signed)
 Physical Therapy Treatment Patient Details Name: John [name] MRN: [id number] DOB: 01-16-1946 Today's Date: 08/15/2024   History of Present Illness John [name] is a 79 yr old male admitted to the hospital with shortness of breath on 07/25/24. He was found to have acute respiratory failure with hypoxia and multi-focal PNA requiring heated high flow O2. PMH: current small cell lung cancer s/p brachytherapy, bladder CA, HTN, aortic regurgitation, arthritis, psoriasis    PT Comments  The patient  received resting in bed on 7 L HFNC. Patient  moved to sitting onto bed edge with no assistance then  ambulated x 5' on 8 L, spo2 dropped  to 83%. Patient rested ~ 4 . Patient then placed on 10 L per ok from RRRT. Patient ambulated x 20' with Rw , HR up to 130, SPO2 dropped to 83%. Patient rested  with return to 90% within ~  2, HR down to 110, Noted RR in high 30's  Continue PT  for progressive ambulation .  Patient will benefit from continued inpatient follow up therapy, <3 hours/day    If plan is discharge home, recommend the following: A lot of help with walking and/or transfers;A little help with bathing/dressing/bathroom;Assistance with cooking/housework;Assist for transportation;Help with stairs or ramp for entrance   Can travel by private vehicle        Equipment Recommendations  None recommended by PT    Recommendations for Other Services       Precautions / Restrictions Precautions Precautions: Fall Recall of Precautions/Restrictions: Intact Precaution/Restrictions Comments: watch O2 on HHFNC, slight improvement Restrictions Weight Bearing Restrictions Per Provider Order: No     Mobility  Bed Mobility   Bed Mobility: Supine to Sit     Supine to sit: Supervision     General bed mobility comments: assist with lines    Transfers Overall transfer level: Needs assistance Equipment used: Rolling walker (2 wheels) Transfers: Sit to/from Stand Sit to Stand: Min assist, From  elevated surface           General transfer comment: cues for hand placement    Ambulation/Gait Ambulation/Gait assistance: Min assist, +2 safety/equipment Gait Distance (Feet): 5 Feet (then 20') Assistive device: Rolling walker (2 wheels) Gait Pattern/deviations: Step-to pattern, Shuffle Gait velocity: decr     General Gait Details: patient  able to porgress ambulation , required increased O2 to 10 L per RRT recommendation   Stairs             Wheelchair Mobility     Tilt Bed    Modified Rankin (Stroke Patients Only)       Balance Overall balance assessment: Needs assistance Sitting-balance support: No upper extremity supported, Feet supported Sitting balance-Leahy Scale: Good     Standing balance support: Bilateral upper extremity supported, Reliant on assistive device for balance, During functional activity Standing balance-Leahy Scale: Poor Standing balance comment: CGA to min assist with RW                            Communication Communication Communication: Impaired Factors Affecting Communication: Reduced clarity of speech (SOB with taliking)  Cognition Arousal: Alert Behavior During Therapy: WFL for tasks assessed/performed   PT - Cognitive impairments: No apparent impairments                       PT - Cognition Comments: Eager to mobilize Following commands: Intact      Cueing Cueing Techniques: Verbal  cues  Exercises      General Comments        Pertinent Vitals/Pain Pain Assessment Pain Assessment: No/denies pain    Home Living                          Prior Function            PT Goals (current goals can now be found in the care plan section) Progress towards PT goals: Progressing toward goals    Frequency    Min 2X/week      PT Plan      Co-evaluation PT/OT/SLP Co-Evaluation/Treatment: Yes Reason for Co-Treatment: To address functional/ADL transfers PT goals addressed during  session: Mobility/safety with mobility OT goals addressed during session: ADL's and self-care      AM-PAC PT 6 Clicks Mobility   Outcome Measure  Help needed turning from your back to your side while in a flat bed without using bedrails?: None Help needed moving from lying on your back to sitting on the side of a flat bed without using bedrails?: None Help needed moving to and from a bed to a chair (including a wheelchair)?: A Little Help needed standing up from a chair using your arms (e.g., wheelchair or bedside chair)?: A Little Help needed to walk in hospital room?: A Lot Help needed climbing 3-5 steps with a railing? : Total 6 Click Score: 17    End of Session Equipment Utilized During Treatment: Gait belt Activity Tolerance: Patient tolerated treatment well Patient left: in chair;with call bell/phone within reach;with chair alarm set Nurse Communication: Mobility status PT Visit Diagnosis: Difficulty in walking, not elsewhere classified (R26.2)     Time: 8884-8844 PT Time Calculation (min) (ACUTE ONLY): 40 min  Charges:    $Gait Training: 8-22 mins PT General Charges $$ ACUTE PT VISIT: 1 Visit                     Darice Potters PT Acute Rehabilitation Services Office [phone]    Potters Darice Norris 08/15/2024, 1:14 PM

## 2024-08-15 NOTE — Plan of Care (Signed)
 PMT no charge plan of care note.  PMT following peripherally Chart reviewed O 2 requirements decreasing - currently on HF [place] 6L per min Opioid needs - Required only 1 dose of 7.5 mg PO Oxy IR in the past 24 hours.  No additional inpatient palliative specific recommendations at this time.  No charge Lonia Serve MD [place] palliative.

## 2024-08-15 NOTE — Progress Notes (Signed)
 Occupational Therapy Treatment Patient Details Name: John [name] MRN: [id number] DOB: 22-Jun-1946 Today's Date: 08/15/2024   History of present illness John [name] is a 79 yr old male admitted to the hospital with shortness of breath on 07/25/24. He was found to have acute respiratory failure with hypoxia and multi-focal PNA requiring heated high flow O2. PMH: current small cell lung cancer s/p brachytherapy, bladder CA, HTN, aortic regurgitation, arthritis, psoriasis   OT comments  The pt was seen for progression of out of bed functional activity and facilitation for improved activity tolerance needed to optimize his ADL performance. He progressed to performing sit to stand using a RW with min assist. He was noted to be on 7L O2 via HFNC, however required 8 to 10L for progression of activity. He ambulated 5 feet, then 20 feet using a RW, requiring a seated rest break between ambulation sets. Once seated upright in the bedside chair, he performed hand washing with set-up assist, then initiated self feeding without the need for assistance. With activity, his heart rate increased up to 130 bpm & his O2 saturation decreased to 83%. He required cues for pursed lip breathing throughout the session. He presented with good effort and participation. He is progressing towards his OT goals and his OT plan of care was extended this date. Patient will benefit from continued inpatient follow up therapy, <3 hours/day.       If plan is discharge home, recommend the following:  Assistance with cooking/housework;Assist for transportation;A little help with walking and/or transfers;A little help with bathing/dressing/bathroom   Equipment Recommendations  BSC/3in1;Tub/shower bench    Recommendations for Other Services      Precautions / Restrictions Precautions Precautions: Fall Restrictions Weight Bearing Restrictions Per Provider Order: No Other Position/Activity Restrictions: monitor O2 and heart rate        Mobility Bed Mobility Overal bed mobility: Needs Assistance Bed Mobility: Supine to Sit     Supine to sit: Supervision          Transfers Overall transfer level: Needs assistance Equipment used: Rolling walker (2 wheels) Transfers: Sit to/from Stand Sit to Stand: Min assist, From elevated surface           General transfer comment: cues for hand placement     Balance     Sitting balance-Leahy Scale: Good       Standing balance-Leahy Scale: Poor Standing balance comment: CGA to min assist with RW            ADL either performed or assessed with clinical judgement   ADL Overall ADL's : Needs assistance/impaired Eating/Feeding: Independent;Sitting   Grooming: Set up;Sitting Grooming Details (indicate cue type and reason): He performed hand washing seated in the bedside chair.         Upper Body Dressing : Minimal assistance;Sitting Upper Body Dressing Details (indicate cue type and reason): Pt required light assist to donn a hospital gown around his back seated EOB.                         Communication Communication Communication: Impaired Factors Affecting Communication: Reduced clarity of speech (SOB with taliking)   Cognition Arousal: Alert Behavior During Therapy: WFL for tasks assessed/performed Cognition: No apparent impairments             OT - Cognition Comments: Oriented x4                 Following commands: Intact  Cueing   Cueing Techniques: Verbal cues             Pertinent Vitals/ Pain       Pain Assessment Pain Assessment: No/denies pain   Frequency  Min 2X/week        Progress Toward Goals  OT Goals(current goals can now be found in the care plan section)  Progress towards OT goals: Progressing toward goals  Acute Rehab OT Goals OT Goal Formulation: With patient Time For Goal Achievement: 08/26/24 Potential to Achieve Goals: Good ADL Goals Pt Will Perform Grooming: with  modified independence;sitting Pt Will Perform Lower Body Dressing: with set-up;with supervision;sit to/from stand Pt Will Transfer to Toilet: with set-up;with supervision;ambulating;grab bars Pt Will Perform Toileting - Clothing Manipulation and hygiene: with set-up;with supervision  Plan      Co-evaluation      Reason for Co-Treatment: To address functional/ADL transfers PT goals addressed during session: Mobility/safety with mobility OT goals addressed during session: ADL's and self-care      AM-PAC OT 6 Clicks Daily Activity     Outcome Measure   Help from another person eating meals?: None Help from another person taking care of personal grooming?: A Little Help from another person toileting, which includes using toliet, bedpan, or urinal?: A Little Help from another person bathing (including washing, rinsing, drying)?: A Lot Help from another person to put on and taking off regular upper body clothing?: A Little Help from another person to put on and taking off regular lower body clothing?: A Little 6 Click Score: 18    End of Session Equipment Utilized During Treatment: Gait belt;Rolling walker (2 wheels);Oxygen   OT Visit Diagnosis: Muscle weakness (generalized) (M62.81);Unsteadiness on feet (R26.81);Other abnormalities of gait and mobility (R26.89)   Activity Tolerance Patient tolerated treatment well   Patient Left in chair;with call bell/phone within reach;with chair alarm set   Nurse Communication Mobility status        Time: 8881-8844 OT Time Calculation (min): 37 min  Charges: OT General Charges $OT Visit: 1 Visit OT Treatments $Therapeutic Activity: 8-22 mins    Delanna JINNY Lesches, OTR/L 08/15/2024, 3:01 PM

## 2024-08-15 NOTE — Progress Notes (Addendum)
 " PROGRESS NOTE    John [name]  FMW:[id number] DOB: 03-08-1946 DOA: 07/25/2024 PCP: [facility]  Chief Complaint  Patient presents with   Weakness    Brief Narrative:   79 yo with hx small cell lung cancer, prostate cancer, HTN and other medical issues who presented with progressive shortness of breath.    Received last dose of immunotherapy on 12/16.  On 12/30 he had CXR resulting on 1/10 showing bilateral pneumonia.  He noted progressive SOB and decreased exercise tolerance.  On 1/11 was prescribed doxycycline which was started on 1/13.  He presented to the ED on 1/14 with progressive SOB and was noted to be hypoxic, satting 68% on RA requiring 15 L of oxygen .    Imaging at admission showed findings concerning for multifocal pneumonia.  Concern also for ICI pneumonitis.  He's required HHFNC (as high as 60 L).  PCCM was consulted.  He's received steroids and infliximab .    He's gradually improved, now on 8 L [place].    Significant Events 1/14 Admit 1/15 steroids added, HHFNC 75%-80%, 50L 1/19 HF 45>60% 1/24 on high flow nasal cannula, 70%, 55 L.  Infliximab . 1/26 remains on heated high flow 80% FiO2 50 L oxygen  saturations 96 and above 1/28 attempting to wean HHFNC as able today  1/29 still remains on high flow nasal cannula, decreased to 50 L and 70% 1/30 FiO2 remains 50% with flow fluctuating between 50 and 60  Assessment & Plan:   Principal Problem:   Multifocal pneumonia Active Problems:   History of lung cancer   Shortness of breath   Sepsis (HCC)   Palliative care by specialist   General weakness   Need for emotional support   Acute hypoxic respiratory failure (HCC)   Pneumonitis   Counseling and coordination of care   Medication management   Small cell carcinoma of lung (HCC)   DNR (do not resuscitate)  Acute hypoxic respiratory failure Immune Checkpoint Inhibitor Pneumonitis  Multifocal Pneumonia Concern for Lymphangitic Spread of Cancer CT  1/14 with multilobar pneumonia, post radiation changes in upper and lower lobes bilaterally.  Enlargement of mediastinal LN.  Loculated moderate L effusion.   CXR 1/27 with patchy bilateral airspace opacities, small L effusion Initially required heated high flow nasal cannula (50-55 L), he's currently significantly improved on 8 L [place]. Appreciate PCCM.  He's improved from presentation.   Treated with zosyn  1/16-22.  Steroids with solumedrol 1/15-21, now on prednisone  taper.  He received infliximab  on 1/24. Bactrim  for PCP [facility] signed off on 2/2, recommending reaching out to them as he's closer to discharge to ensure he has follow up.  Last seen by oncology on 1/20. Strict I/O, daily weights.  He's negative.  Will consider trial of lasix .   Extensive Stage Small Cell Lung Cancer on chemotherapy: follows up with oncologist Dr. Sherrod.  S/p 12 cycles carboplatin , etoposide , imfinzi .   Underwent radiation in the past.  Received last dose of immunotherapy with durvalumab  12/16. Last oncology note 1/20.   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 Last echo from 2018 had shown moderate AI.  Repeat echo showed mild to moderate AI PTA meds- amlodipine 10 mg daily.   H/o prostate cancer s/p brachytherapy: H/o bladder cancer: Follow up Oncology.   Leucocytosis : Likely due to steroids. Improving.  Goals of Care Appreciate palliative assistance.  See 2/2 note.    DVT prophylaxis: lovenox  Code Status: DNR  Family Communication: none Disposition:   Status is: Inpatient Remains inpatient appropriate because: need for continued inpatient care   Consultants:  Pulmonary Palliative care oncology  Procedures:  Echo IMPRESSIONS     1. Left ventricular ejection fraction, by estimation, is 60 to 65%. The  left ventricle has normal function. The left ventricle has no regional  wall motion  abnormalities. There is mild left ventricular hypertrophy.   2. Right ventricular systolic function is normal. The right ventricular  size is normal.   3. Trivial mitral valve regurgitation.   4. The aortic valve is tricuspid. Aortic valve regurgitation is mild to  moderate. Aortic valve sclerosis/calcification is present, without any  evidence of aortic stenosis.   5. The inferior vena cava is normal in size with greater than 50%  respiratory variability, suggesting right atrial pressure of 3 mmHg.   Antimicrobials:  Anti-infectives (From admission, onward)    Start     Dose/Rate Route Frequency Ordered Stop   08/03/24 1000  sulfamethoxazole -trimethoprim  (BACTRIM  DS) 800-160 MG per tablet 1 tablet        1 tablet Oral Every M-W-F 08/02/24 1206     07/27/24 1400  piperacillin -tazobactam (ZOSYN ) IVPB 3.375 g        3.375 g 12.5 mL/hr over 240 Minutes Intravenous Every 8 hours 07/27/24 1308 08/03/24 1113   07/25/24 1330  cefTRIAXone  (ROCEPHIN ) 2 g in sodium chloride  0.9 % 100 mL IVPB        2 g 200 mL/hr over 30 Minutes Intravenous Once 07/25/24 1315 07/25/24 1409   07/25/24 1330  azithromycin  (ZITHROMAX ) tablet 500 mg        500 mg Oral  Once 07/25/24 1315 07/25/24 1416       Subjective:  Feels better overall Feels maybe 50% of his baseline Still SOB At baseline, got around with cane.  Lived alone.  Didn't drive.  Dressed/bathed himself.  Objective: Vitals:   08/15/24 0400 08/15/24 0427 08/15/24 0500 08/15/24 0600  BP: (!) 104/42  (!) 114/48 (!) 116/49  Pulse: 84  74 91  Resp: 15  16 17   Temp:  (!) 97.5 F (36.4 C)    TempSrc:  Oral    SpO2: 98%  97% 100%  Weight:      Height:        Intake/Output Summary (Last 24 hours) at 08/15/2024 0756 Last data filed at 08/15/2024 0600 Gross per 24 hour  Intake 240 ml  Output 1800 ml  Net -1560 ml   Filed Weights   07/27/24 1822  Weight: 63.8 kg    Examination:  General exam: chronically ill appearing, frail -  weak Respiratory system: bibasilar crackles, short breaths Cardiovascular system: RRR Gastrointestinal system: Abdomen is nondistended, soft and nontender.  Central nervous system: Alert and oriented. No focal neurological deficits. Generally weak, expends Gilberto Streck lot of effort moving around in bed. Extremities: no LEE   Data Reviewed: I have personally reviewed following labs and imaging studies  CBC: Recent Labs  Lab 08/09/24 0355 08/10/24 0447 08/13/24 0417  WBC 20.2* 17.4* 16.1*  HGB 11.8* 11.5* 12.4*  HCT 36.1* 36.5* 38.2*  MCV 96.3 97.1 96.5  PLT 242 236 212    Basic Metabolic Panel: Recent Labs  Lab 08/09/24 0355 08/13/24 0417  NA 140 138  K 4.8 4.2  CL 101 99  CO2 31 32  GLUCOSE 105* 113*  BUN 20 17  CREATININE 0.81 0.84  CALCIUM 10.0 9.9  MG 2.1  --   PHOS 2.4*  --  GFR: Estimated Creatinine Clearance: 64.3 mL/min (by C-G formula based on SCr of 0.84 mg/dL).  Liver Function Tests: No results for input(s): AST, ALT, ALKPHOS, BILITOT, PROT, ALBUMIN in the last 168 hours.  CBG: Recent Labs  Lab 08/10/24 1634  GLUCAP 184*     No results found for this or any previous visit (from the past 240 hours).       Radiology Studies: No results found.      Scheduled Meds:  Chlorhexidine  Gluconate Cloth  6 each Topical Daily   enoxaparin  (LOVENOX ) injection  40 mg Subcutaneous Q24H   feeding supplement  237 mL Oral BID BM   guaiFENesin   600 mg Oral BID   pneumococcal 20-valent conjugate vaccine  0.5 mL Intramuscular Tomorrow-1000   predniSONE   50 mg Oral Q breakfast   Followed by   NOREEN ON 08/16/2024] predniSONE   40 mg Oral Q breakfast   Followed by   NOREEN ON 08/23/2024] predniSONE   30 mg Oral Q breakfast   Followed by   NOREEN ON 08/30/2024] predniSONE   20 mg Oral Q breakfast   Followed by   NOREEN ON 09/06/2024] predniSONE   10 mg Oral Q breakfast   senna  1 tablet Oral BID   sodium chloride  flush  10-40 mL Intracatheter Q12H    sulfamethoxazole -trimethoprim   1 tablet Oral Q M,W,F   Continuous Infusions:   LOS: 21 days    Time spent: over 30 min     Meliton Monte, MD [facility]   To contact the attending provider between 7A-7P or the covering provider during after hours 7P-7A, please log into the web site [email or web address] and access using universal [place] password for that web site. If you do not have the password, please call the hospital operator.  08/15/2024, 7:56 AM    "

## 2024-08-16 ENCOUNTER — Other Ambulatory Visit: Payer: Self-pay

## 2024-08-16 LAB — CBC WITH DIFFERENTIAL/PLATELET
Abs Immature Granulocytes: 0.12 10*3/uL — ABNORMAL HIGH (ref 0.00–0.07)
Basophils Absolute: 0 10*3/uL (ref 0.0–0.1)
Basophils Relative: 0 %
Eosinophils Absolute: 0.1 10*3/uL (ref 0.0–0.5)
Eosinophils Relative: 1 %
HCT: 39.2 % (ref 39.0–52.0)
Hemoglobin: 12 g/dL — ABNORMAL LOW (ref 13.0–17.0)
Immature Granulocytes: 1 %
Lymphocytes Relative: 4 %
Lymphs Abs: 0.7 10*3/uL (ref 0.7–4.0)
MCH: 30.6 pg (ref 26.0–34.0)
MCHC: 30.6 g/dL (ref 30.0–36.0)
MCV: 100 fL (ref 80.0–100.0)
Monocytes Absolute: 0.9 10*3/uL (ref 0.1–1.0)
Monocytes Relative: 5 %
Neutro Abs: 14.9 10*3/uL — ABNORMAL HIGH (ref 1.7–7.7)
Neutrophils Relative %: 89 %
Platelets: 202 10*3/uL (ref 150–400)
RBC: 3.92 MIL/uL — ABNORMAL LOW (ref 4.22–5.81)
RDW: 13.6 % (ref 11.5–15.5)
WBC: 16.7 10*3/uL — ABNORMAL HIGH (ref 4.0–10.5)
nRBC: 0 % (ref 0.0–0.2)

## 2024-08-16 LAB — MAGNESIUM: Magnesium: 2.2 mg/dL (ref 1.7–2.4)

## 2024-08-16 LAB — PHOSPHORUS: Phosphorus: 2.8 mg/dL (ref 2.5–4.6)

## 2024-08-16 LAB — COMPREHENSIVE METABOLIC PANEL WITH GFR
ALT: 29 U/L (ref 0–44)
AST: 19 U/L (ref 15–41)
Albumin: 3.3 g/dL — ABNORMAL LOW (ref 3.5–5.0)
Alkaline Phosphatase: 67 U/L (ref 38–126)
Anion gap: 5 (ref 5–15)
BUN: 25 mg/dL — ABNORMAL HIGH (ref 8–23)
CO2: 34 mmol/L — ABNORMAL HIGH (ref 22–32)
Calcium: 10.2 mg/dL (ref 8.9–10.3)
Chloride: 102 mmol/L (ref 98–111)
Creatinine, Ser: 0.88 mg/dL (ref 0.61–1.24)
GFR, Estimated: >60 mL/min
Glucose, Bld: 107 mg/dL — ABNORMAL HIGH (ref 70–99)
Potassium: 4.7 mmol/L (ref 3.5–5.1)
Sodium: 141 mmol/L (ref 135–145)
Total Bilirubin: 0.4 mg/dL (ref 0.0–1.2)
Total Protein: 6.1 g/dL — ABNORMAL LOW (ref 6.5–8.1)

## 2024-08-16 MED ORDER — FUROSEMIDE 10 MG/ML IJ SOLN
20.0000 mg | Freq: Once | INTRAMUSCULAR | Status: AC
  Administered 2024-08-16: 20 mg via INTRAVENOUS
  Filled 2024-08-16: qty 2

## 2024-08-16 NOTE — Plan of Care (Signed)
  Problem: Education: Goal: Knowledge of General Education information will improve Description: Including pain rating scale, medication(s)/side effects and non-pharmacologic comfort measures Outcome: Progressing   Problem: Health Behavior/Discharge Planning: Goal: Ability to manage health-related needs will improve Outcome: Progressing   Problem: Clinical Measurements: Goal: Respiratory complications will improve Outcome: Progressing   

## 2024-08-16 NOTE — Progress Notes (Addendum)
 " PROGRESS NOTE    John [name]  FMW:[id number] DOB: Oct 12, 1945 DOA: 07/25/2024 PCP: [facility]  Chief Complaint  Patient presents with   Weakness    Brief Narrative:   79 yo with hx small cell lung cancer, prostate cancer, HTN and other medical issues who presented with progressive shortness of breath.    Received last dose of immunotherapy on 12/16.  On 12/30 he had CXR resulting on 1/10 showing bilateral pneumonia.  He noted progressive SOB and decreased exercise tolerance.  On 1/11 was prescribed doxycycline which was started on 1/13.  He presented to the ED on 1/14 with progressive SOB and was noted to be hypoxic, satting 68% on RA requiring 15 L of oxygen .    Imaging at admission showed findings concerning for multifocal pneumonia.  Concern also for ICI pneumonitis.  He's required HHFNC (as high as 60 L).  PCCM was consulted.  He's received steroids and infliximab .    He's gradually improved, now on 8 L [place].    Significant Events 1/14 Admit 1/15 steroids added, HHFNC 75%-80%, 50L 1/19 HF 45>60% 1/24 on high flow nasal cannula, 70%, 55 L.  Infliximab . 1/26 remains on heated high flow 80% FiO2 50 L oxygen  saturations 96 and above 1/28 attempting to wean HHFNC as able today  1/29 still remains on high flow nasal cannula, decreased to 50 L and 70% 1/30 FiO2 remains 50% with flow fluctuating between 50 and 60  Assessment & Plan:   Principal Problem:   Multifocal pneumonia Active Problems:   History of lung cancer   Shortness of breath   Sepsis (HCC)   Palliative care by specialist   General weakness   Need for emotional support   Acute hypoxic respiratory failure (HCC)   Pneumonitis   Counseling and coordination of care   Medication management   Small cell carcinoma of lung (HCC)   DNR (do not resuscitate)  Acute hypoxic respiratory failure Immune Checkpoint Inhibitor Pneumonitis  Multifocal Pneumonia Concern for Lymphangitic Spread of Cancer CT  1/14 with multilobar pneumonia, post radiation changes in upper and lower lobes bilaterally.  Enlargement of mediastinal LN.  Loculated moderate L effusion.   CXR 1/27 with patchy bilateral airspace opacities, small L effusion Initially required heated high flow nasal cannula (50-55 L), he's currently significantly improved on 8 L [place]. Appreciate PCCM.  He's improved from presentation.   Treated with zosyn  1/16-22.  Steroids with solumedrol 1/15-21, now on prednisone  taper.  He received infliximab  on 1/24. Bactrim  for PCP [facility] signed off on 2/2, recommending reaching out to them as he's closer to discharge to ensure he has follow up.  Last seen by oncology on 1/20. Strict I/O, daily weights.  He's negative.  Trial low dose lasix  today (had 1.3 L out yesterday, will trend UOP).     He appears stable for [name] progressive floor.   Extensive Stage Small Cell Lung Cancer on chemotherapy: follows up with oncologist Dr. Sherrod.  S/p 12 cycles carboplatin , etoposide , imfinzi .   Underwent radiation in the past.  Received last dose of immunotherapy with durvalumab  12/16. Last oncology note 1/20.   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 Last echo from 2018 had shown moderate AI.  Repeat echo showed mild to moderate AI amlodipine 10 mg daily.   H/o prostate cancer s/p brachytherapy: H/o bladder cancer: Follow up Oncology.   Leucocytosis : Likely due to steroids. Fluctuating - trend.  Goals of Care Appreciate palliative assistance.  See 2/2 note.    DVT prophylaxis: lovenox  Code Status: DNR Family Communication: none Disposition:   Status is: Inpatient Remains inpatient appropriate because: need for continued inpatient care   Consultants:  Pulmonary Palliative care oncology  Procedures:  Echo IMPRESSIONS     1. Left ventricular ejection fraction, by estimation, is 60 to 65%. The   left ventricle has normal function. The left ventricle has no regional  wall motion abnormalities. There is mild left ventricular hypertrophy.   2. Right ventricular systolic function is normal. The right ventricular  size is normal.   3. Trivial mitral valve regurgitation.   4. The aortic valve is tricuspid. Aortic valve regurgitation is mild to  moderate. Aortic valve sclerosis/calcification is present, without any  evidence of aortic stenosis.   5. The inferior vena cava is normal in size with greater than 50%  respiratory variability, suggesting right atrial pressure of 3 mmHg.   Antimicrobials:  Anti-infectives (From admission, onward)    Start     Dose/Rate Route Frequency Ordered Stop   08/03/24 1000  sulfamethoxazole -trimethoprim  (BACTRIM  DS) 800-160 MG per tablet 1 tablet        1 tablet Oral Every M-W-F 08/02/24 1206     07/27/24 1400  piperacillin -tazobactam (ZOSYN ) IVPB 3.375 g        3.375 g 12.5 mL/hr over 240 Minutes Intravenous Every 8 hours 07/27/24 1308 08/03/24 1113   07/25/24 1330  cefTRIAXone  (ROCEPHIN ) 2 g in sodium chloride  0.9 % 100 mL IVPB        2 g 200 mL/hr over 30 Minutes Intravenous Once 07/25/24 1315 07/25/24 1409   07/25/24 1330  azithromycin  (ZITHROMAX ) tablet 500 mg        500 mg Oral  Once 07/25/24 1315 07/25/24 1416       Subjective:  Feels John [name] little better than yesterday  John [name] little better than 50%  Objective: Vitals:   08/16/24 0500 08/16/24 0600 08/16/24 0700 08/16/24 0800  BP: (!) 118/39 (!) 112/42 (!) 112/45 (!) 126/48  Pulse: 73 72 75 80  Resp: (!) 21 14 19 19   Temp:      TempSrc:      SpO2: 100% 98% 98% 100%  Weight: 64.2 kg     Height:        Intake/Output Summary (Last 24 hours) at 08/16/2024 0813 Last data filed at 08/16/2024 0524 Gross per 24 hour  Intake 600 ml  Output 1350 ml  Net -750 ml   Filed Weights   07/27/24 1822 08/16/24 0500  Weight: 63.8 kg 64.2 kg    Examination:  General: No acute  distress. Cardiovascular: RRR Lungs: diminished anterior exam Neurological: Alert and oriented 3. Moves all extremities 4 . Cranial nerves II through XII grossly intact. Extremities: No clubbing or cyanosis. No edema  Data Reviewed: I have personally reviewed following labs and imaging studies  CBC: Recent Labs  Lab 08/10/24 0447 08/13/24 0417 08/16/24 0511  WBC 17.4* 16.1* 16.7*  NEUTROABS  --   --  14.9*  HGB 11.5* 12.4* 12.0*  HCT 36.5* 38.2* 39.2  MCV 97.1 96.5 100.0  PLT 236 212 202    Basic Metabolic Panel: Recent Labs  Lab 08/13/24 0417 08/16/24 0511  NA 138 141  K 4.2 4.7  CL 99 102  CO2 32 34*  GLUCOSE 113* 107*  BUN 17 25*  CREATININE 0.84 0.88  CALCIUM 9.9 10.2  MG  --  2.2  PHOS  --  2.8    GFR: Estimated Creatinine Clearance: 61.8 mL/min (by C-G formula based on SCr of 0.88 mg/dL).  Liver Function Tests: Recent Labs  Lab 08/16/24 0511  AST 19  ALT 29  ALKPHOS 67  BILITOT 0.4  PROT 6.1*  ALBUMIN 3.3*    CBG: Recent Labs  Lab 08/10/24 1634  GLUCAP 184*     No results found for this or any previous visit (from the past 240 hours).       Radiology Studies: No results found.      Scheduled Meds:  Chlorhexidine  Gluconate Cloth  6 each Topical Daily   enoxaparin  (LOVENOX ) injection  40 mg Subcutaneous Q24H   feeding supplement  237 mL Oral BID BM   guaiFENesin   600 mg Oral BID   pneumococcal 20-valent conjugate vaccine  0.5 mL Intramuscular Tomorrow-1000   predniSONE   40 mg Oral Q breakfast   Followed by   NOREEN ON 08/23/2024] predniSONE   30 mg Oral Q breakfast   Followed by   NOREEN ON 08/30/2024] predniSONE   20 mg Oral Q breakfast   Followed by   NOREEN ON 09/06/2024] predniSONE   10 mg Oral Q breakfast   senna  1 tablet Oral BID   sodium chloride  flush  10-40 mL Intracatheter Q12H   sulfamethoxazole -trimethoprim   1 tablet Oral Q M,W,F   Continuous Infusions:   LOS: 22 days    Time spent: over 30 min      Meliton Monte, MD [facility]   To contact the attending provider between 7A-7P or the covering provider during after hours 7P-7A, please log into the web site [email or web address] and access using universal [place] password for that web site. If you do not have the password, please call the hospital operator.  08/16/2024, 8:13 AM    "

## 2024-08-16 NOTE — [place] FL2 (Signed)
 " [place]  MEDICAID FL2 LEVEL OF CARE FORM     IDENTIFICATION  Patient Name: John [name] Birthdate: [birth year] Sex: male Admission Date (Current Location): 07/25/2024  [facility] and [place] Number:  Producer, Television/film/video and Address:  [facility],  [address] [place], [place] [address]      Provider Number: [id number]  Attending Physician Name and Address:  Perri DELENA Meliton Mickey., *  Relative Name and Phone Number:  Nelwyn Erminio Ahumada [phone]    Current Level of Care: Hospital Recommended Level of Care: Skilled Nursing Facility Prior Approval Number:    Date Approved/Denied:   PASRR Number: [id number]  Discharge Plan: SNF    Current Diagnoses: Patient Active Problem List   Diagnosis Date Noted   DNR (do not resuscitate) 08/13/2024   Medication management 08/09/2024   Small cell carcinoma of lung (HCC) 08/09/2024   Need for emotional support 08/08/2024   Acute hypoxic respiratory failure (HCC) 08/08/2024   Pneumonitis 08/08/2024   Counseling and coordination of care 08/08/2024   Palliative care by specialist 08/03/2024   General weakness 08/03/2024   History of lung cancer 07/26/2024   Shortness of breath 07/26/2024   Sepsis (HCC) 07/26/2024   Multifocal pneumonia 07/25/2024   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) 04/05/2024   Port-A-Cath in place 09/26/2023   Encounter for antineoplastic chemotherapy 09/26/2023   Encounter for antineoplastic immunotherapy 09/26/2023   Goals of care, counseling/discussion 08/25/2023   Malignant neoplasm of right upper lobe of lung (HCC) 08/21/2023   Malignant neoplasm of upper lobe of left lung (HCC) 08/21/2023   Abnormal CT of the chest 08/08/2023   Nocturia    Mobitz type 1 second degree atrioventricular block    Hypertensive cardiovascular disease    Hydrocele    History of prostate cancer    Frequency of urination     Orientation RESPIRATION BLADDER Height & Weight     Self, Situation, Place  O2 Continent  Weight: 64.2 kg Height:  6' (182.9 cm)  BEHAVIORAL SYMPTOMS/MOOD NEUROLOGICAL BOWEL NUTRITION STATUS      Continent Diet (Regular, Thin Liquids)  AMBULATORY STATUS COMMUNICATION OF NEEDS Skin   Extensive Assist Verbally Normal                       Personal Care Assistance Level of Assistance  Bathing, Dressing, Feeding Bathing Assistance: Limited assistance Feeding assistance: Limited assistance Dressing Assistance: Limited assistance     Functional Limitations Info  Sight, Hearing, Speech Sight Info: Impaired (Wears eyeglasses) Hearing Info: Adequate Speech Info: Adequate    SPECIAL CARE FACTORS FREQUENCY  PT (By licensed PT), OT (By licensed OT)     PT Frequency: 5x/wk OT Frequency: 5x/wk            Contractures Contractures Info: Not present    Additional Factors Info  Code Status, Allergies Code Status Info: DNR Allergies Info: No Known Allergies           Current Medications (08/16/2024):  This is the current hospital active medication list Current Facility-Administered Medications  Medication Dose Route Frequency Provider Last Rate Last Admin   acetaminophen  (TYLENOL ) tablet 650 mg  650 mg Oral Q6H PRN Dahal, Binaya, MD       Or   acetaminophen  (TYLENOL ) suppository 650 mg  650 mg Rectal Q6H PRN Dahal, Binaya, MD       bisacodyl  (DULCOLAX) EC tablet 5 mg  5 mg Oral Daily PRN Arlice Reichert, MD  Chlorhexidine  Gluconate Cloth 2 % PADS 6 each  6 each Topical Daily Dahal, Binaya, MD   6 each at 08/15/24 2035   enoxaparin  (LOVENOX ) injection 40 mg  40 mg Subcutaneous Q24H Dahal, Chapman, MD   40 mg at 08/15/24 1631   feeding supplement (ENSURE PLUS HIGH PROTEIN) liquid 237 mL  237 mL Oral BID BM Dahal, Chapman, MD   237 mL at 08/13/24 1430   guaiFENesin  (MUCINEX ) 12 hr tablet 600 mg  600 mg Oral BID Dahal, Binaya, MD   600 mg at 08/16/24 0912   guaiFENesin -dextromethorphan (ROBITUSSIN DM) 100-10 MG/5ML syrup 5 mL  5 mL Oral Q6H PRN Dahal, Chapman, MD        hydrALAZINE  (APRESOLINE ) injection 10 mg  10 mg Intravenous Q6H PRN Dahal, Binaya, MD       ipratropium-albuterol  (DUONEB) 0.5-2.5 (3) MG/3ML nebulizer solution 3 mL  3 mL Nebulization Q6H PRN Dahal, Chapman, MD   3 mL at 08/03/24 0040   melatonin tablet 3 mg  3 mg Oral QHS PRN [place] Isaiah CROME, NP   3 mg at 08/08/24 0119   Oral care mouth rinse  15 mL Mouth Rinse PRN Dahal, Chapman, MD       oxyCODONE  (Oxy IR/ROXICODONE ) immediate release tablet 7.5 mg  7.5 mg Oral Q4H PRN Perri DELENA Meliton Mickey., MD   7.5 mg at 08/15/24 2344   pneumococcal 20-valent conjugate vaccine (PREVNAR 20 ) injection 0.5 mL  0.5 mL Intramuscular Tomorrow-1000 Dahal, Binaya, MD       polyethylene glycol (MIRALAX  / GLYCOLAX ) packet 17 g  17 g Oral Daily PRN Dahal, Binaya, MD   17 g at 08/12/24 2248   predniSONE  (DELTASONE ) tablet 40 mg  40 mg Oral Q breakfast Will Almarie MATSU, MD   40 mg at 08/16/24 0912   Followed by   NOREEN ON 08/23/2024] predniSONE  (DELTASONE ) tablet 30 mg  30 mg Oral Q breakfast Will Almarie MATSU, MD       Followed by   NOREEN ON 08/30/2024] predniSONE  (DELTASONE ) tablet 20 mg  20 mg Oral Q breakfast Will Almarie MATSU, MD       Followed by   NOREEN ON 09/06/2024] predniSONE  (DELTASONE ) tablet 10 mg  10 mg Oral Q breakfast Will Almarie MATSU, MD       senna (SENOKOT) tablet 8.6 mg  1 tablet Oral BID Dahal, Chapman, MD   8.6 mg at 08/16/24 0912   sodium chloride  flush (NS) 0.9 % injection 10-40 mL  10-40 mL Intracatheter Q12H Dahal, Chapman, MD   20 mL at 08/16/24 0915   sodium chloride  flush (NS) 0.9 % injection 10-40 mL  10-40 mL Intracatheter PRN Dahal, Chapman, MD       sulfamethoxazole -trimethoprim  (BACTRIM  DS) 800-160 MG per tablet 1 tablet  1 tablet Oral Q M,W,F Will Almarie MATSU, MD   1 tablet at 08/15/24 9196   traZODone  (DESYREL ) tablet 50 mg  50 mg Oral QHS PRN Ghimire, Kuber, MD   50 mg at 08/15/24 2344     Discharge Medications: Please see discharge summary for a list of discharge  medications.  Relevant Imaging Results:  Relevant Lab Results:   Additional Information SSN: [id number]  Jon ONEIDA Anon, RN     "

## 2024-08-16 NOTE — TOC Progression Note (Signed)
 Transition of Care [facility]) - Progression Note    Patient Details  Name: John [name] MRN: [id number] Date of Birth: [birth year]  Transition of [facility]) CM/SW Contact  Jon ONEIDA Anon, RN Phone Number: 08/16/2024, 2:26 PM  Clinical Narrative:     PT/OT recommending STR at SNF once medically ready to discharge. RNCM met with pt at bedside to discuss recommendation and pt is in agreement with [facility] sending out referrals for SNF placement. Spoke with MD and he states pt will likely be ready for discharge on Monday. Referrals sent out for SNF placement. Awaiting any bed offers. ICM will continue to follow for DC planning needs.   Expected Discharge Plan: Skilled Nursing Facility Barriers to Discharge: Continued Medical Work up               Expected Discharge Plan and Services In-house Referral: Hospice / Palliative Care Discharge Planning Services: CM Consult Post Acute Care Choice: Skilled Nursing Facility Living arrangements for the past 2 months: Apartment                 DME Arranged: N/A DME Agency: NA       HH Arranged: NA HH Agency: NA         Social Drivers of Health (SDOH) Interventions SDOH Screenings   Food Insecurity: No Food Insecurity (07/27/2024)  Housing: Low Risk (07/27/2024)  Transportation Needs: No Transportation Needs (07/27/2024)  Utilities: Not At Risk (07/27/2024)  Depression (PHQ2-9): Low Risk (06/27/2024)  Tobacco Use: Medium Risk (07/27/2024)    Readmission Risk Interventions    07/27/2024    3:13 PM  Readmission Risk Prevention Plan  Transportation Screening Complete  PCP or Specialist Appt within 5-7 Days Complete  Home Care Screening Complete  Medication Review (RN CM) Complete

## 2024-08-17 MED ORDER — FUROSEMIDE 10 MG/ML IJ SOLN
20.0000 mg | Freq: Once | INTRAMUSCULAR | Status: AC
  Administered 2024-08-17: 20 mg via INTRAVENOUS
  Filled 2024-08-17: qty 2

## 2024-08-17 NOTE — Progress Notes (Signed)
 " PROGRESS NOTE    John [name]  FMW:[id number] DOB: [birth year] DOA: 07/25/2024 PCP: Practice, [facility]  Chief Complaint  Patient presents with   Weakness    Brief Narrative:   79 yo with hx small cell lung cancer, prostate cancer, HTN and other medical issues who presented with progressive shortness of breath.    Received last dose of immunotherapy on 12/16.  On 12/30 he had CXR resulting on 1/10 showing bilateral pneumonia.  He noted progressive SOB and decreased exercise tolerance.  On 1/11 was prescribed doxycycline which was started on 1/13.  He presented to the ED on 1/14 with progressive SOB and was noted to be hypoxic, satting 68% on RA requiring 15 L of oxygen .    Imaging at admission showed findings concerning for multifocal pneumonia.  Concern also for ICI pneumonitis.  He's required HHFNC (as high as 60 L).  PCCM was consulted.  He's received steroids and infliximab .    He's gradually improved, now on 8 L [place].    Significant Events 1/14 Admit 1/15 steroids added, HHFNC 75%-80%, 50L 1/19 HF 45>60% 1/24 on high flow nasal cannula, 70%, 55 L.  Infliximab . 1/26 remains on heated high flow 80% FiO2 50 L oxygen  saturations 96 and above 1/28 attempting to wean HHFNC as able today  1/29 still remains on high flow nasal cannula, decreased to 50 L and 70% 1/30 FiO2 remains 50% with flow fluctuating between 50 and 60  Assessment & Plan:   Principal Problem:   Multifocal pneumonia Active Problems:   History of lung cancer   Shortness of breath   Sepsis (HCC)   Palliative care by specialist   General weakness   Need for emotional support   Acute hypoxic respiratory failure (HCC)   Pneumonitis   Counseling and coordination of care   Medication management   Small cell carcinoma of lung (HCC)   DNR (do not resuscitate)  Acute hypoxic respiratory failure Immune Checkpoint Inhibitor Pneumonitis  Multifocal Pneumonia Concern for Lymphangitic Spread of Cancer CT  1/14 with multilobar pneumonia, post radiation changes in upper and lower lobes bilaterally.  Enlargement of mediastinal LN.  Loculated moderate L effusion.   CXR 1/27 with patchy bilateral airspace opacities, small L effusion Initially required heated high flow nasal cannula (50-55 L), he's currently significantly improved on 6 L [place]. Appreciate PCCM.  He's improved from presentation.   Treated with zosyn  1/16-22.  Steroids with solumedrol 1/15-21, now on prednisone  taper.  He received infliximab  on 1/24. Bactrim  for PCP ppx Pulmonary signed off on 2/2, recommending reaching out to them as he's closer to discharge to ensure he has follow up.  Last seen by oncology on 1/20. Strict I/O, daily weights.  He's negative.  Trial low dose lasix  again today (had 1.2 L out yesterday, will trend UOP).     He appears stable for Arham Symmonds progressive floor.   Extensive Stage Small Cell Lung Cancer on chemotherapy: follows up with oncologist Dr. Sherrod.  S/p 12 cycles carboplatin , etoposide , imfinzi .   Underwent radiation in the past.  Received last dose of immunotherapy with durvalumab  12/16. Last oncology note 1/20.   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 Last echo from 2018 had shown moderate AI.  Repeat echo showed mild to moderate AI amlodipine 10 mg daily.   H/o prostate cancer s/p brachytherapy: H/o bladder cancer: Follow up Oncology.   Leucocytosis : Likely due to steroids. Fluctuating - trend.  Goals of Care Appreciate palliative assistance.  See 2/2 note.    DVT prophylaxis: lovenox  Code Status: DNR Family Communication: none Disposition:   Status is: Inpatient Remains inpatient appropriate because: need for continued inpatient care   Consultants:  Pulmonary Palliative care oncology  Procedures:  Echo IMPRESSIONS     1. Left ventricular ejection fraction, by estimation, is 60 to 65%.  The  left ventricle has normal function. The left ventricle has no regional  wall motion abnormalities. There is mild left ventricular hypertrophy.   2. Right ventricular systolic function is normal. The right ventricular  size is normal.   3. Trivial mitral valve regurgitation.   4. The aortic valve is tricuspid. Aortic valve regurgitation is mild to  moderate. Aortic valve sclerosis/calcification is present, without any  evidence of aortic stenosis.   5. The inferior vena cava is normal in size with greater than 50%  respiratory variability, suggesting right atrial pressure of 3 mmHg.   Antimicrobials:  Anti-infectives (From admission, onward)    Start     Dose/Rate Route Frequency Ordered Stop   08/03/24 1000  sulfamethoxazole -trimethoprim  (BACTRIM  DS) 800-160 MG per tablet 1 tablet        1 tablet Oral Every M-W-F 08/02/24 1206     07/27/24 1400  piperacillin -tazobactam (ZOSYN ) IVPB 3.375 g        3.375 g 12.5 mL/hr over 240 Minutes Intravenous Every 8 hours 07/27/24 1308 08/03/24 1113   07/25/24 1330  cefTRIAXone  (ROCEPHIN ) 2 g in sodium chloride  0.9 % 100 mL IVPB        2 g 200 mL/hr over 30 Minutes Intravenous Once 07/25/24 1315 07/25/24 1409   07/25/24 1330  azithromycin  (ZITHROMAX ) tablet 500 mg        500 mg Oral  Once 07/25/24 1315 07/25/24 1416       Subjective:  Thinks lasix  may have helped  Objective: Vitals:   08/17/24 0758 08/17/24 0904 08/17/24 1244 08/17/24 1420  BP:   (!) 123/55 (!) 113/52  Pulse:  98 95 92  Resp:   (!) 30 (!) 30  Temp:   98.3 F (36.8 C) 97.9 F (36.6 C)  TempSrc:   Oral Oral  SpO2:  (S) (!) 51% 98% 95%  Weight: 64.5 kg     Height:        Intake/Output Summary (Last 24 hours) at 08/17/2024 1505 Last data filed at 08/17/2024 1417 Gross per 24 hour  Intake 1080 ml  Output 800 ml  Net 280 ml   Filed Weights   07/27/24 1822 08/16/24 0500 08/17/24 0758  Weight: 63.8 kg 64.2 kg 64.5 kg    Examination:  General: No acute  distress. Lungs: unlabored Neurological: Alert and oriented 3. Moves all extremities 4 with equal strength. Cranial nerves II through XII grossly intact.. Extremities: No clubbing or cyanosis. No edema.  Data Reviewed: I have personally reviewed following labs and imaging studies  CBC: Recent Labs  Lab 08/13/24 0417 08/16/24 0511  WBC 16.1* 16.7*  NEUTROABS  --  14.9*  HGB 12.4* 12.0*  HCT 38.2* 39.2  MCV 96.5 100.0  PLT 212 202    Basic Metabolic Panel: Recent Labs  Lab 08/13/24 0417 08/16/24 0511  NA 138 141  K 4.2 4.7  CL 99 102  CO2 32 34*  GLUCOSE 113* 107*  BUN 17 25*  CREATININE 0.84 0.88  CALCIUM 9.9 10.2  MG  --  2.2  PHOS  --  2.8    GFR: Estimated  Creatinine Clearance: 62.1 mL/min (by C-G formula based on SCr of 0.88 mg/dL).  Liver Function Tests: Recent Labs  Lab 08/16/24 0511  AST 19  ALT 29  ALKPHOS 67  BILITOT 0.4  PROT 6.1*  ALBUMIN 3.3*    CBG: Recent Labs  Lab 08/10/24 1634  GLUCAP 184*     No results found for this or any previous visit (from the past 240 hours).       Radiology Studies: No results found.      Scheduled Meds:  Chlorhexidine  Gluconate Cloth  6 each Topical Daily   enoxaparin  (LOVENOX ) injection  40 mg Subcutaneous Q24H   feeding supplement  237 mL Oral BID BM   guaiFENesin   600 mg Oral BID   pneumococcal 20-valent conjugate vaccine  0.5 mL Intramuscular Tomorrow-1000   predniSONE   40 mg Oral Q breakfast   Followed by   NOREEN ON 08/23/2024] predniSONE   30 mg Oral Q breakfast   Followed by   NOREEN ON 08/30/2024] predniSONE   20 mg Oral Q breakfast   Followed by   NOREEN ON 09/06/2024] predniSONE   10 mg Oral Q breakfast   senna  1 tablet Oral BID   sodium chloride  flush  10-40 mL Intracatheter Q12H   sulfamethoxazole -trimethoprim   1 tablet Oral Q M,W,F   Continuous Infusions:   LOS: 23 days    Time spent: over 30 min     Meliton Monte, MD [facility]   To contact the  attending provider between 7A-7P or the covering provider during after hours 7P-7A, please log into the web site [email or web address] and access using universal [place] password for that web site. If you do not have the password, please call the hospital operator.  08/17/2024, 3:05 PM    "

## 2024-08-17 NOTE — Progress Notes (Signed)
 Physical Therapy Treatment Patient Details Name: John [name] John [name] [name] MRN: [id number] DOB: 08-13-1945 Today's Date: 08/17/2024   History of Present Illness John [name] John [name] [name] is a 79 yr old male admitted to the hospital with shortness of breath on 07/25/24. He was found to have acute respiratory failure with hypoxia and multi-focal PNA requiring heated high flow O2. PMH: current small cell lung cancer s/p brachytherapy, bladder CA, HTN, aortic regurgitation, arthritis, psoriasis    PT Comments  Pt assisted with ambulating short distance in hallway limited by fatigue and dyspnea.  Current d/c plan remains appropriate. Patient will benefit from continued inpatient follow up therapy, <3 hours/day.  SpO2 on 6L at rest: 96% SpO2 on 6L ambulating: 80% SpO2 on 8L ambulating: 88% however dropped to 80% upon sitting in recliner 3-4 minutes recovery at rest on 8L for improved back into lower 90s. Pt able to return to 6L O2 once back in bed.     If plan is discharge home, recommend the following: A little help with bathing/dressing/bathroom;Assistance with cooking/housework;Assist for transportation;Help with stairs or ramp for entrance;A little help with walking and/or transfers   Can travel by private vehicle        Equipment Recommendations  None recommended by PT    Recommendations for Other Services       Precautions / Restrictions Precautions Precautions: Fall Recall of Precautions/Restrictions: Intact Precaution/Restrictions Comments: monitor SpO2, RR, HR; currently on 6L HFNC at rest     Mobility  Bed Mobility Overal bed mobility: Needs Assistance Bed Mobility: Sit to Supine       Sit to supine: Supervision        Transfers Overall transfer level: Needs assistance Equipment used: Rolling walker (2 wheels) Transfers: Sit to/from Stand Sit to Stand: Min assist           General transfer comment: verbal cues for hand placement, improved from min assist to CGA (pt took a few seated  rest breaks)    Ambulation/Gait Ambulation/Gait assistance: Min assist, +2 safety/equipment Gait Distance (Feet): 7 Feet Assistive device: Rolling walker (2 wheels) Gait Pattern/deviations: Step-to pattern, Trunk flexed, Narrow base of support Gait velocity: decr     General Gait Details: 7 ft seated rest break and then another 11 ft; assist upon second ambulation due to LOB and unsteadiness (likely due to fatigue), increased work of breathing and RR observed; monitored SPO2 and HR, cues for pursed lip breathing as pt tends to mouth breathe   Stairs             Wheelchair Mobility     Tilt Bed    Modified Rankin (Stroke Patients Only)       Balance Overall balance assessment: Needs assistance         Standing balance support: Bilateral upper extremity supported, Reliant on assistive device for balance, During functional activity Standing balance-Leahy Scale: Poor                              Communication Communication Communication: Other (comment) (decreased verbalizations due to dyspnea)  Cognition Arousal: Alert Behavior During Therapy: WFL for tasks assessed/performed, Flat affect   PT - Cognitive impairments: No apparent impairments                         Following commands: Intact      Cueing Cueing Techniques: Verbal cues  Exercises      General Comments  Pertinent Vitals/Pain Pain Assessment Pain Assessment: No/denies pain    Home Living                          Prior Function            PT Goals (current goals can now be found in the care plan section) Progress towards PT goals: Progressing toward goals    Frequency    Min 2X/week      PT Plan      Co-evaluation              AM-PAC PT 6 Clicks Mobility   Outcome Measure  Help needed turning from your back to your side while in a flat bed without using bedrails?: None Help needed moving from lying on your back to sitting  on the side of a flat bed without using bedrails?: None Help needed moving to and from a bed to a chair (including a wheelchair)?: A Little Help needed standing up from a chair using your arms (e.g., wheelchair or bedside chair)?: A Little Help needed to walk in hospital room?: A Lot Help needed climbing 3-5 steps with a railing? : Total 6 Click Score: 17    End of Session Equipment Utilized During Treatment: Gait belt Activity Tolerance: Patient limited by fatigue Patient left: in bed;with call bell/phone within reach;with bed alarm set Nurse Communication: Mobility status PT Visit Diagnosis: Difficulty in walking, not elsewhere classified (R26.2)     Time: 8952-8888 PT Time Calculation (min) (ACUTE ONLY): 24 min  Charges:    $Gait Training: 23-37 mins PT General Charges $$ ACUTE PT VISIT: 1 Visit                    Tari KLEIN, DPT Physical Therapist Acute Rehabilitation Services Office: [phone]    Tari CROME [place] 08/17/2024, 12:06 PM

## 2024-08-17 NOTE — Progress Notes (Signed)
 Occupational Therapy Treatment Patient Details Name: John [name] MRN: [id number] DOB: 1946/01/12 Today's Date: 08/17/2024   History of present illness [name] is a 79 yr old male admitted to the hospital with shortness of breath on 07/25/24. He was found to have acute respiratory failure with hypoxia and multi-focal PNA requiring heated high flow O2. PMH: current small cell lung cancer s/p brachytherapy, bladder CA, HTN, aortic regurgitation, arthritis, psoriasis   OT comments  The pt was assisted into standing using  RW. He ambulated a couple steps to the sink, where he required CGA to perform upper body grooming tasks. He was subsequently assisted to the the bedside chair. He was noted to be on 6L O2 via HFNC. His O2 saturation decreased to 84% on 6L with activity and his heart rate increased up to 140 bpm. Continue OT plan of care to address his compromised ADL performance. Patient will benefit from continued inpatient follow up therapy, <3 hours/day.       If plan is discharge home, recommend the following:  Assistance with cooking/housework;Assist for transportation;A little help with walking and/or transfers;A little help with bathing/dressing/bathroom   Equipment Recommendations  BSC/3in1;Tub/shower bench    Recommendations for Other Services      Precautions / Restrictions Precautions Precautions: Fall Restrictions Weight Bearing Restrictions Per Provider Order: No Other Position/Activity Restrictions: monitor O2 and heart rate       Mobility Bed Mobility Overal bed mobility: Needs Assistance Bed Mobility: Supine to Sit     Supine to sit: Supervision, HOB elevated, Used rails          Transfers Overall transfer level: Needs assistance Equipment used: Rolling walker (2 wheels) Transfers: Sit to/from Stand Sit to Stand: Min assist          Balance     Sitting balance-Leahy Scale: Good         Standing balance comment: CGA to min assist with RW            ADL either performed or assessed with clinical judgement   ADL Overall ADL's : Needs assistance/impaired     Grooming: Contact guard assist;Standing Grooming Details (indicate cue type and reason): The pt required steadying assist in order to perform face washing and teeth brushing standing at the sink. OT verbally prompted him to step closer to sink while performing task.            Communication Communication Communication: Other (comment) (decreased verbalizations due to dyspnea)   Cognition Arousal: Alert Behavior During Therapy: WFL for tasks assessed/performed, Flat affect Cognition: No apparent impairments             OT - Cognition Comments: Oriented x4                 Following commands: Intact        Cueing   Cueing Techniques: Verbal cues             Pertinent Vitals/ Pain       Pain Assessment Pain Assessment: No/denies pain   Frequency  Min 2X/week        Progress Toward Goals  OT Goals(current goals can now be found in the care plan section)  Progress towards OT goals: Progressing toward goals  Acute Rehab OT Goals OT Goal Formulation: With patient Time For Goal Achievement: 08/26/24 Potential to Achieve Goals: Good  Plan         AM-PAC OT 6 Clicks Daily Activity     Outcome Measure  Help from another person eating meals?: None Help from another person taking care of personal grooming?: A Little Help from another person toileting, which includes using toliet, bedpan, or urinal?: A Little Help from another person bathing (including washing, rinsing, drying)?: A Lot Help from another person to put on and taking off regular upper body clothing?: A Little Help from another person to put on and taking off regular lower body clothing?: A Little 6 Click Score: 18    End of Session Equipment Utilized During Treatment: Gait belt;Rolling walker (2 wheels);Oxygen   OT Visit Diagnosis: Muscle weakness (generalized)  (M62.81);Unsteadiness on feet (R26.81);Other abnormalities of gait and mobility (R26.89)   Activity Tolerance Patient tolerated treatment well   Patient Left in chair;with call bell/phone within reach;with chair alarm set   Nurse Communication Mobility status        Time: 8995-8977 OT Time Calculation (min): 18 min  Charges: OT General Charges $OT Visit: 1 Visit OT Treatments $Self Care/Home Management : 8-22 mins    Delanna JINNY Lesches, OTR/L 08/17/2024, 12:33 PM

## 2024-08-17 NOTE — Progress Notes (Signed)
 "                                                                                                                                                                                                          Daily Progress Note   Patient Name: John [name]       Date: 08/17/2024 DOB: February 01, 1946  Age: 79 y.o. MRN#: [id number] Attending Physician: Perri DELENA Meliton Mickey., * Primary Care Physician: Practice, [facility] Family Admit Date: 07/25/2024  Reason for Consultation/Follow-up: Establishing goals of care and Non pain symptom management  Subjective: Has transferred out of the ICU/stepdown unit, currently on 6 L [place]. Awake alert, states that he is feeling better, denies any acute uncontrolled sensations of dyspnea or tachypnea.   Length of Stay: 23  Current Medications: Scheduled Meds:   Chlorhexidine  Gluconate Cloth  6 each Topical Daily   enoxaparin  (LOVENOX ) injection  40 mg Subcutaneous Q24H   feeding supplement  237 mL Oral BID BM   guaiFENesin   600 mg Oral BID   pneumococcal 20-valent conjugate vaccine  0.5 mL Intramuscular Tomorrow-1000   predniSONE   40 mg Oral Q breakfast   Followed by   NOREEN ON 08/23/2024] predniSONE   30 mg Oral Q breakfast   Followed by   NOREEN ON 08/30/2024] predniSONE   20 mg Oral Q breakfast   Followed by   NOREEN ON 09/06/2024] predniSONE   10 mg Oral Q breakfast   senna  1 tablet Oral BID   sodium chloride  flush  10-40 mL Intracatheter Q12H   sulfamethoxazole -trimethoprim   1 tablet Oral Q M,W,F    Continuous Infusions:   PRN Meds: acetaminophen  **OR** acetaminophen , bisacodyl , guaiFENesin -dextromethorphan, hydrALAZINE , ipratropium-albuterol , melatonin, mouth rinse, oxyCODONE , polyethylene glycol, sodium chloride  flush, traZODone   Physical Exam         Awake alert No distress Is on )2 6 L [place].  No edema Oriented Regular work of breathing  Vital Signs: BP (!) 112/55 (BP Location: Right Arm)   Pulse 98   Temp 98 F (36.7 C) (Oral)   Resp 19    Ht 6' (1.829 m)   Wt 64.5 kg   SpO2 (S) (!) 51% Comment: rn aware and discussed placing on PO telemetry- placed back on 6 lpm [place]- rn agrees to recheck.  BMI 19.29 kg/m  SpO2: SpO2: (S) (!) 51 % (rn aware and discussed placing on PO telemetry- placed back on 6 lpm [place]- rn agrees to recheck.) O2 Device: O2 Device: Room Air O2 Flow Rate: O2 Flow Rate (L/min): 6 L/min  Intake/output summary:  Intake/Output  Summary (Last 24 hours) at 08/17/2024 1022 Last data filed at 08/17/2024 9040 Gross per 24 hour  Intake 1720 ml  Output 1400 ml  Net 320 ml   LBM: Last BM Date : 08/16/24 Baseline Weight: Weight: 63.8 kg Most recent weight: Weight: 64.5 kg       Palliative Assessment/Data:      Patient Active Problem List   Diagnosis Date Noted   DNR (do not resuscitate) 08/13/2024   Medication management 08/09/2024   Small cell carcinoma of lung (HCC) 08/09/2024   Need for emotional support 08/08/2024   Acute hypoxic respiratory failure (HCC) 08/08/2024   Pneumonitis 08/08/2024   Counseling and coordination of care 08/08/2024   Palliative care by specialist 08/03/2024   General weakness 08/03/2024   History of lung cancer 07/26/2024   Shortness of breath 07/26/2024   Sepsis (HCC) 07/26/2024   Multifocal pneumonia 07/25/2024   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) 04/05/2024   Port-A-Cath in place 09/26/2023   Encounter for antineoplastic chemotherapy 09/26/2023   Encounter for antineoplastic immunotherapy 09/26/2023   Goals of care, counseling/discussion 08/25/2023   Malignant neoplasm of right upper lobe of lung (HCC) 08/21/2023   Malignant neoplasm of upper lobe of left lung (HCC) 08/21/2023   Abnormal CT of the chest 08/08/2023   Nocturia    Mobitz type 1 second degree atrioventricular block    Hypertensive cardiovascular disease    Hydrocele    History of prostate cancer    Frequency of urination     Palliative Care Assessment & Plan   Patient  Profile: Assessment  79 year old male with extensive-stage small cell lung cancer (s/p carboplatin /etoposide  and durvalumab ; last immunotherapy 12/16), prior thoracic radiation, and multiple comorbidities, admitted with acute hypoxic respiratory failure secondary to immune checkpoint inhibitor (ICI) pneumonitis with superimposed multifocal pneumonia, with concern for possible lymphangitic spread of malignancy. Initially required heated high-flow nasal cannula; now clinically improving.  Plan  1. Acute Hypoxic Respiratory Failure / ICI Pneumonitis / Multifocal Pneumonia  Presented with severe hypoxemia (SpO? 68% on room air), initially requiring HHFNC up to 50-60 L/min.  CT chest (1/14): multilobar pneumonia, post-radiation changes bilaterally, mediastinal lymphadenopathy, loculated moderate left pleural effusion.  CXR (1/27): persistent patchy bilateral airspace opacities with small left pleural effusion.  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  Completed piperacillin -tazobactam (1/16-1/22).  On Bactrim  for PCP prophylaxis.  Marked clinical improvement; currently on 6-8 L nasal cannula, stable for progressive care floor.  Pulmonary service signed off on 2/2;  primary service has recommended re-engagement prior to discharge to ensure outpatient follow-up.    2. Extensive-Stage Small Cell Lung Cancer  s/p 12 cycles carboplatin /etoposide  with durvalumab ; prior radiation therapy. Last oncology evaluation on 1/20; follows with Dr. Sherrod. Current hospitalization complicates future systemic therapy options; prognosis guarded given disease extent and treatment-related pulmonary toxicity. Will continue to support oncology-led decision-making and reassess goals as clinical trajectory evolves.Also recommend palliative support at [facility] rehab as well as at the cancer center.   3. Symptom  Management - Pain / Dyspnea  Oxycodone  IR 7.5 mg PO q4-6h PRN remains available. Only one PRN dose required in the past 24 hours, suggesting symptoms currently well controlled. Continue opioid PRN for dyspnea-related air hunger and pain as needed.  4. Goals of Care / Supportive Care  Patient demonstrating meaningful respiratory improvement from admission. Palliative care to continue following for symptom management, prognostic support, and goals-of-care discussions as clinical course clarifies, particularly in  the context of limited oncologic reserve.  5. Disposition SNF rehab with palliative. TOC note reviewed - referral sent out.   Code Status:    Code Status Orders  (From admission, onward)           Start     Ordered   07/27/24 1242  Do not attempt resuscitation (DNR)- Limited -Do Not Intubate (DNI)  (Code Status)  Continuous       Question Answer Comment  If pulseless and not breathing No CPR or chest compressions.   In Pre-Arrest Conditions (Patient Is Breathing and Has A Pulse) Do not intubate. Provide all appropriate non-invasive medical interventions. Avoid ICU transfer unless indicated or required.   Consent: Discussion documented in EHR or advanced directives reviewed      07/27/24 1241           Code Status History     Date Active Date Inactive Code Status Order ID Comments User Context   07/25/2024 1503 07/27/2024 1241 Full Code [id number]  Arlice Reichert, MD ED   09/19/2023 1558 09/20/2023 0514 Full Code [id number]  Johann Sieving, MD HOV       Prognosis:  Unable to determine  Discharge Planning: Skilled Nursing Facility for rehab with Palliative care service follow-up  Care plan was discussed with patient   Thank you for allowing the Palliative Medicine Team to assist in the care of this patient.   I personally spent a total of 35 minutes in the care of the patient today including preparing to see the patient, getting/reviewing separately obtained  history, performing a medically appropriate exam/evaluation, counseling and educating, and documenting clinical information in the EHR.   Lonia Serve, MD  Please contact Palliative Medicine Team phone at [phone] for questions and concerns.       "

## 2024-08-21 ENCOUNTER — Inpatient Hospital Stay

## 2024-08-21 ENCOUNTER — Inpatient Hospital Stay: Admitting: Physician Assistant

## 2024-08-28 ENCOUNTER — Inpatient Hospital Stay: Attending: Internal Medicine | Admitting: Dietician

## 2024-08-28 ENCOUNTER — Inpatient Hospital Stay: Admitting: Physician Assistant

## 2024-08-28 ENCOUNTER — Inpatient Hospital Stay

## 2024-08-29 ENCOUNTER — Inpatient Hospital Stay: Admission: RE | Admit: 2024-08-29 | Source: Ambulatory Visit

## 2024-09-18 ENCOUNTER — Inpatient Hospital Stay: Admitting: Internal Medicine

## 2024-09-18 ENCOUNTER — Inpatient Hospital Stay

## 2024-09-25 ENCOUNTER — Inpatient Hospital Stay: Admitting: Internal Medicine

## 2024-09-25 ENCOUNTER — Inpatient Hospital Stay
